# Patient Record
Sex: Female | Born: 1950 | Race: Black or African American | Hispanic: No | Marital: Married | Smoking: Former smoker
Health system: Southern US, Community
[De-identification: ages and names within clinical notes are randomized; demographics above are authoritative.]

## PROBLEM LIST (undated history)

## (undated) DIAGNOSIS — M1711 Unilateral primary osteoarthritis, right knee: Secondary | ICD-10-CM

## (undated) DIAGNOSIS — D708 Other neutropenia: Secondary | ICD-10-CM

## (undated) DIAGNOSIS — K219 Gastro-esophageal reflux disease without esophagitis: Secondary | ICD-10-CM

## (undated) DIAGNOSIS — H269 Unspecified cataract: Secondary | ICD-10-CM

## (undated) DIAGNOSIS — I776 Arteritis, unspecified: Secondary | ICD-10-CM

## (undated) DIAGNOSIS — D649 Anemia, unspecified: Secondary | ICD-10-CM

## (undated) DIAGNOSIS — Z9889 Other specified postprocedural states: Secondary | ICD-10-CM

## (undated) DIAGNOSIS — G971 Other reaction to spinal and lumbar puncture: Secondary | ICD-10-CM

## (undated) DIAGNOSIS — M069 Rheumatoid arthritis, unspecified: Secondary | ICD-10-CM

## (undated) DIAGNOSIS — R112 Nausea with vomiting, unspecified: Secondary | ICD-10-CM

## (undated) DIAGNOSIS — Z5189 Encounter for other specified aftercare: Secondary | ICD-10-CM

## (undated) DIAGNOSIS — I7300 Raynaud's syndrome without gangrene: Secondary | ICD-10-CM

## (undated) HISTORY — PX: OTHER SURGICAL HISTORY: SHX169

## (undated) HISTORY — PX: HIP ARTHROPLASTY: SHX981

## (undated) HISTORY — DX: Unspecified cataract: H26.9

## (undated) HISTORY — DX: Encounter for other specified aftercare: Z51.89

## (undated) HISTORY — PX: UPPER GI ENDOSCOPY: SHX6162

## (undated) HISTORY — PX: JOINT REPLACEMENT: SHX530

## (undated) HISTORY — DX: Gastro-esophageal reflux disease without esophagitis: K21.9

## (undated) HISTORY — PX: GASTRIC BYPASS: SHX52

---

## 1979-05-03 HISTORY — PX: TUBAL LIGATION: SHX77

## 2003-05-03 HISTORY — PX: JOINT REPLACEMENT: SHX530

## 2008-05-02 HISTORY — PX: HERNIA REPAIR: SHX51

## 2013-07-25 ENCOUNTER — Other Ambulatory Visit: Payer: Self-pay | Admitting: Obstetrics and Gynecology

## 2013-07-25 DIAGNOSIS — Z1231 Encounter for screening mammogram for malignant neoplasm of breast: Secondary | ICD-10-CM

## 2013-08-16 ENCOUNTER — Ambulatory Visit
Admission: RE | Admit: 2013-08-16 | Discharge: 2013-08-16 | Disposition: A | Discharge status: Home / Self Care | Payer: 59 | Source: Ambulatory Visit | Attending: Obstetrics and Gynecology | Admitting: Obstetrics and Gynecology

## 2013-08-16 DIAGNOSIS — Z1231 Encounter for screening mammogram for malignant neoplasm of breast: Secondary | ICD-10-CM

## 2013-08-23 ENCOUNTER — Other Ambulatory Visit (HOSPITAL_COMMUNITY): Payer: Self-pay | Admitting: Orthopaedic Surgery

## 2013-09-03 ENCOUNTER — Encounter (HOSPITAL_COMMUNITY): Payer: Self-pay | Admitting: Pharmacy Technician

## 2013-09-05 ENCOUNTER — Encounter (HOSPITAL_COMMUNITY)
Admission: RE | Admit: 2013-09-05 | Discharge: 2013-09-05 | Disposition: A | Discharge status: Home / Self Care | Payer: 59 | Source: Ambulatory Visit | Attending: Orthopaedic Surgery | Admitting: Orthopaedic Surgery

## 2013-09-05 ENCOUNTER — Encounter (HOSPITAL_COMMUNITY): Payer: Self-pay

## 2013-09-05 DIAGNOSIS — Z01812 Encounter for preprocedural laboratory examination: Secondary | ICD-10-CM | POA: Diagnosis present

## 2013-09-05 HISTORY — DX: Rheumatoid arthritis, unspecified: M06.9

## 2013-09-05 HISTORY — DX: Other reaction to spinal and lumbar puncture: G97.1

## 2013-09-05 HISTORY — DX: Anemia, unspecified: D64.9

## 2013-09-05 LAB — CBC
HCT: 39.2 % (ref 36.0–46.0)
HEMOGLOBIN: 12.5 g/dL (ref 12.0–15.0)
MCH: 28.2 pg (ref 26.0–34.0)
MCHC: 31.9 g/dL (ref 30.0–36.0)
MCV: 88.5 fL (ref 78.0–100.0)
PLATELETS: 308 10*3/uL (ref 150–400)
RBC: 4.43 MIL/uL (ref 3.87–5.11)
RDW: 14.4 % (ref 11.5–15.5)
WBC: 6.6 10*3/uL (ref 4.0–10.5)

## 2013-09-05 LAB — COMPREHENSIVE METABOLIC PANEL
ALT: 20 U/L (ref 0–35)
AST: 31 U/L (ref 0–37)
Albumin: 3.9 g/dL (ref 3.5–5.2)
Alkaline Phosphatase: 77 U/L (ref 39–117)
BUN: 18 mg/dL (ref 6–23)
CO2: 28 mEq/L (ref 19–32)
Calcium: 10.2 mg/dL (ref 8.4–10.5)
Chloride: 101 mEq/L (ref 96–112)
Creatinine, Ser: 0.83 mg/dL (ref 0.50–1.10)
GFR calc non Af Amer: 74 mL/min — ABNORMAL LOW (ref >90)
GFR, EST AFRICAN AMERICAN: 86 mL/min — AB (ref >90)
GLUCOSE: 85 mg/dL (ref 70–99)
Potassium: 4.5 mEq/L (ref 3.7–5.3)
SODIUM: 141 meq/L (ref 137–147)
Total Bilirubin: 0.4 mg/dL (ref 0.3–1.2)
Total Protein: 7 g/dL (ref 6.0–8.3)

## 2013-09-05 LAB — PROTIME-INR
INR: 0.96 (ref 0.00–1.49)
PROTHROMBIN TIME: 12.6 s (ref 11.6–15.2)

## 2013-09-05 LAB — ABO/RH: ABO/RH(D): A POS

## 2013-09-05 LAB — URINALYSIS, ROUTINE W REFLEX MICROSCOPIC
BILIRUBIN URINE: NEGATIVE
Glucose, UA: NEGATIVE mg/dL
Hgb urine dipstick: NEGATIVE
Ketones, ur: NEGATIVE mg/dL
LEUKOCYTES UA: NEGATIVE
Nitrite: NEGATIVE
Protein, ur: NEGATIVE mg/dL
SPECIFIC GRAVITY, URINE: 1.006 (ref 1.005–1.030)
Urobilinogen, UA: 0.2 mg/dL (ref 0.0–1.0)
pH: 7 (ref 5.0–8.0)

## 2013-09-05 LAB — TYPE AND SCREEN
ABO/RH(D): A POS
Antibody Screen: NEGATIVE

## 2013-09-05 LAB — SURGICAL PCR SCREEN
MRSA, PCR: NEGATIVE
Staphylococcus aureus: NEGATIVE

## 2013-09-05 LAB — APTT: aPTT: 25 seconds (ref 24–37)

## 2013-09-05 NOTE — Pre-Procedure Instructions (Signed)
Chelsea Callahan  09/05/2013   Your procedure is scheduled on: Friday, Sep 13, 2013 at 7:30 AM  Report to [facility] Short Stay Admitting Area  (use Main Entrance "A') at 5:30 AM.  Call this number if you have problems the morning of surgery: [phone]   Remember:   Do not eat food or drink liquids after midnight.   Take these medicines the morning of surgery with A SIP OF WATER: hydroxychloroquine (PLAQUENIL), predniSONE (DELTASONE)  Stop taking Aspirin, vitamins and herbal medications (TART CHERRY ADVANCED and Tarragon) Do not take any NSAIDs ie: Ibuprofen, Advil, Naproxen or any medication containing Aspirin. Stop 1 week prior to procedure.   Do not wear jewelry, make-up or nail polish.  Do not wear lotions, powders, or perfumes. You may wear deodorant.  Do not shave 48 hours prior to surgery.  Do not bring valuables to the hospital.  [facility] is not responsible for any belongings or valuables.               Contacts, dentures or bridgework may not be worn into surgery.  Leave suitcase in the car. After surgery it may be brought to your room.  For patients admitted to the hospital, discharge time is determined by your treatment team.               Patients discharged the day of surgery will not be allowed to drive home.  Name and phone number of your driver:  Special Instructions:  Special Instructions:Special Instructions: [facility] - Preparing for Surgery  Before surgery, you can play an important role.  Because skin is not sterile, your skin needs to be as free of germs as possible.  You can reduce the number of germs on you skin by washing with CHG (chlorahexidine gluconate) soap before surgery.  CHG is an antiseptic cleaner which kills germs and bonds with the skin to continue killing germs even after washing.  Please DO NOT use if you have an allergy to CHG or antibacterial soaps.  If your skin becomes reddened/irritated stop using the CHG and inform your nurse when you arrive  at Short Stay.  Do not shave (including legs and underarms) for at least 48 hours prior to the first CHG shower.  You may shave your face.  Please follow these instructions carefully:   1.  Shower with CHG Soap the night before surgery and the morning of Surgery.  2.  If you choose to wash your hair, wash your hair first as usual with your normal shampoo.  3.  After you shampoo, rinse your hair and body thoroughly to remove the Shampoo.  4.  Use CHG as you would any other liquid soap.  You can apply chg directly  to the skin and wash gently with scrungie or a clean washcloth.  5.  Apply the CHG Soap to your body ONLY FROM THE NECK DOWN.  Do not use on open wounds or open sores.  Avoid contact with your eyes, ears, mouth and genitals (private parts).  Wash genitals (private parts) with your normal soap.  6.  Wash thoroughly, paying special attention to the area where your surgery will be performed.  7.  Thoroughly rinse your body with warm water from the neck down.  8.  DO NOT shower/wash with your normal soap after using and rinsing off the CHG Soap.  9.  Pat yourself dry with a clean towel.            10.  Wear clean pajamas.            11.  Place clean sheets on your bed the night of your first shower and do not sleep with pets.  Day of Surgery  Do not apply any lotions the morning of surgery.  Please wear clean clothes to the hospital/surgery center.   Please read over the following fact sheets that you were given: Pain Booklet, Coughing and Deep Breathing, Blood Transfusion Information, Total Joint Packet, MRSA Information and Surgical Site Infection Prevention

## 2013-09-05 NOTE — Progress Notes (Signed)
Pt denies SOB, chest pain, and being under the care of a cardiologist. Pt denies having a cardiac cath. EKG, CXR, echo, stress test results were requested from [facility] in [place].

## 2013-09-10 NOTE — Progress Notes (Signed)
Records request sent on 09/05/13 to [facility] for cardiac records. Noted on chart that MD office stated that they did not have any records on patient. I contacted patient she stated that she was seen there about 1 year ago. I contacted medical records and they advised that they did not know cardiologist was that patient was referred to. Re contacted patient with information, she is going to recheck her records, call the office, and then call me back. I advised her that they had the release form and if they located the records they could go ahead and fax the information to [place].

## 2013-09-10 NOTE — Progress Notes (Addendum)
Patient called back and advised it was 2013 when she had the EKG will get DOS. Called and spoke to [employer] at [employer] they are sending additional records that are needed

## 2013-09-11 NOTE — H&P (Signed)
TOTAL KNEE ADMISSION H&P  Patient is being admitted for left total knee arthroplasty.  Subjective:  Chief Complaint:left knee pain.  HPI: Chelsea Callahan, 63 y.o. female, has a history of pain and functional disability in the left knee due to arthritis and has failed non-surgical conservative treatments for greater than 12 weeks to includeNSAID's and/or analgesics, corticosteriod injections and viscosupplementation injections.  Onset of symptoms was gradual, starting 3 years ago with gradually worsening course since that time. The patient noted no past surgery on the left knee(s).  Patient currently rates pain in the left knee(s) at 7 out of 10 with activity. Patient has worsening of pain with activity and weight bearing and pain that interferes with activities of daily living.  Patient has evidence of subchondral sclerosis, periarticular osteophytes and joint space narrowing by imaging studies. There is no active infection.  There are no active problems to display for this patient.  Past Medical History  Diagnosis Date  . Spinal headache   . RA (rheumatoid arthritis)   . Anemia     Past Surgical History  Procedure Laterality Date  . Cesarean section      x 2  . Hernia repair    . Gastric bypass    . Tubal ligation    . Abdominal clip      to stomach post gastric bypass    No prescriptions prior to admission   Allergies  Allergen Reactions  . Ginger     Stomach swells    History  Substance Use Topics  . Smoking status: Former Smoker    Types: Cigarettes  . Smokeless tobacco: Never Used     Comment: Quit smoking cigarettes in 1999  . Alcohol Use: Yes     Comment: "1 glass of wine 2-3 times a week."    Family History  Problem Relation Age of Onset  . Autoimmune disease Other   . Cancer - Other Other      Review of Systems  Constitutional: Negative.   HENT: Negative.   Eyes: Negative.   Respiratory: Negative.   Cardiovascular: Negative.   Genitourinary: Negative.    Musculoskeletal: Positive for joint pain and myalgias.  Skin: Negative.   Neurological: Negative.   Endo/Heme/Allergies: Negative.   Psychiatric/Behavioral: Negative.     Objective:  Physical Exam  Constitutional: She is oriented to person, place, and time. She appears well-developed and well-nourished.  HENT:  Head: Normocephalic and atraumatic.  Eyes: EOM are normal. Pupils are equal, round, and reactive to light.  Neck: Normal range of motion.  Cardiovascular: Normal rate, regular rhythm, normal heart sounds and intact distal pulses.   No murmur heard. Respiratory: Effort normal and breath sounds normal.  GI: Soft.  Musculoskeletal:  Bilateral varus deformity, left>right.  Medial joint line tenderness. Negative Lachman.  Mild laxity of MCL left. +crepitus.  ROM0-110  Neurological: She is alert and oriented to person, place, and time.  Skin: Skin is warm and dry.  Psychiatric: She has a normal mood and affect.    Vital signs in last 24 hours:    Labs:   There is no height or weight on file to calculate BMI.   Imaging Review Plain radiographs demonstrate severe degenerative joint disease of the left knee(s). The overall alignment issignificant varus. The bone quality appears to be adequate for age and reported activity level.  Assessment/Plan:  End stage arthritis, left knee   The patient history, physical examination, clinical judgment of the provider and imaging studies are consistent with  end stage degenerative joint disease of the left knee(s) and total knee arthroplasty is deemed medically necessary. The treatment options including medical management, injection therapy arthroscopy and arthroplasty were discussed at length. The risks and benefits of total knee arthroplasty were presented and reviewed. The risks due to aseptic loosening, infection, stiffness, patella tracking problems, thromboembolic complications and other imponderables were discussed. The patient  acknowledged the explanation, agreed to proceed with the plan and consent was signed. Patient is being admitted for inpatient treatment for surgery, pain control, PT, OT, prophylactic antibiotics, VTE prophylaxis, progressive ambulation and ADL's and discharge planning. The patient is planning to be discharged home with home health services

## 2013-09-12 MED ORDER — CHLORHEXIDINE GLUCONATE 4 % EX LIQD
60.0000 mL | Freq: Once | CUTANEOUS | Status: DC
  Filled 2013-09-12: qty 60

## 2013-09-12 MED ORDER — CEFAZOLIN SODIUM-DEXTROSE 2-3 GM-% IV SOLR
2.0000 g | INTRAVENOUS | Status: AC
  Administered 2013-09-13: 2 g via INTRAVENOUS
  Filled 2013-09-12: qty 50

## 2013-09-12 MED ORDER — HYDROCORTISONE NA SUCCINATE PF 100 MG IJ SOLR
100.0000 mg | INTRAMUSCULAR | Status: DC
Start: 2013-09-13 — End: 2013-09-13
  Filled 2013-09-12: qty 2

## 2013-09-13 ENCOUNTER — Inpatient Hospital Stay (HOSPITAL_COMMUNITY)
Admission: RE | Admit: 2013-09-13 | Discharge: 2013-09-16 | DRG: 470 | Disposition: A | Discharge status: Home / Self Care | Payer: BC Managed Care – PPO | Source: Ambulatory Visit | Attending: Orthopaedic Surgery | Admitting: Orthopaedic Surgery

## 2013-09-13 ENCOUNTER — Encounter (HOSPITAL_COMMUNITY)
Admission: RE | Disposition: A | Discharge status: Home / Self Care | Payer: Self-pay | Source: Ambulatory Visit | Attending: Orthopaedic Surgery

## 2013-09-13 ENCOUNTER — Encounter (HOSPITAL_COMMUNITY): Payer: Self-pay | Admitting: *Deleted

## 2013-09-13 ENCOUNTER — Inpatient Hospital Stay (HOSPITAL_COMMUNITY): Payer: BC Managed Care – PPO

## 2013-09-13 ENCOUNTER — Encounter (HOSPITAL_COMMUNITY): Payer: BC Managed Care – PPO | Admitting: Anesthesiology

## 2013-09-13 ENCOUNTER — Inpatient Hospital Stay (HOSPITAL_COMMUNITY): Payer: BC Managed Care – PPO | Admitting: Anesthesiology

## 2013-09-13 DIAGNOSIS — M069 Rheumatoid arthritis, unspecified: Secondary | ICD-10-CM | POA: Diagnosis present

## 2013-09-13 DIAGNOSIS — Z9884 Bariatric surgery status: Secondary | ICD-10-CM

## 2013-09-13 DIAGNOSIS — Z87891 Personal history of nicotine dependence: Secondary | ICD-10-CM

## 2013-09-13 DIAGNOSIS — M1712 Unilateral primary osteoarthritis, left knee: Secondary | ICD-10-CM | POA: Diagnosis present

## 2013-09-13 DIAGNOSIS — D62 Acute posthemorrhagic anemia: Secondary | ICD-10-CM | POA: Diagnosis not present

## 2013-09-13 DIAGNOSIS — M359 Systemic involvement of connective tissue, unspecified: Secondary | ICD-10-CM

## 2013-09-13 DIAGNOSIS — D638 Anemia in other chronic diseases classified elsewhere: Secondary | ICD-10-CM | POA: Diagnosis present

## 2013-09-13 DIAGNOSIS — M1710 Unilateral primary osteoarthritis, unspecified knee: Principal | ICD-10-CM | POA: Diagnosis present

## 2013-09-13 HISTORY — PX: KNEE ARTHROPLASTY: SHX992

## 2013-09-13 SURGERY — ARTHROPLASTY, KNEE, TOTAL, USING IMAGELESS COMPUTER-ASSISTED NAVIGATION
Anesthesia: General | Site: Knee | Laterality: Left

## 2013-09-13 MED ORDER — METOCLOPRAMIDE HCL 5 MG/ML IJ SOLN: 5.0000 mg | Freq: Three times a day (TID) | INTRAMUSCULAR | Status: DC | PRN

## 2013-09-13 MED ORDER — METOCLOPRAMIDE HCL 5 MG/ML IJ SOLN
INTRAMUSCULAR | Status: AC
  Filled 2013-09-13: qty 2

## 2013-09-13 MED ORDER — ONDANSETRON HCL 4 MG/2ML IJ SOLN: 4.0000 mg | Freq: Four times a day (QID) | INTRAMUSCULAR | Status: DC | PRN

## 2013-09-13 MED ORDER — KETOROLAC TROMETHAMINE 15 MG/ML IJ SOLN
15.0000 mg | Freq: Four times a day (QID) | INTRAMUSCULAR | Status: AC
  Administered 2013-09-13 – 2013-09-14 (×4): 15 mg via INTRAVENOUS
  Filled 2013-09-13 (×3): qty 1

## 2013-09-13 MED ORDER — OXYCODONE HCL 5 MG/5ML PO SOLN: 5.0000 mg | Freq: Once | ORAL | Status: DC | PRN

## 2013-09-13 MED ORDER — LIDOCAINE HCL (CARDIAC) 20 MG/ML IV SOLN
INTRAVENOUS | Status: AC
  Filled 2013-09-13: qty 5

## 2013-09-13 MED ORDER — ASPIRIN EC 325 MG PO TBEC: 325.0000 mg | DELAYED_RELEASE_TABLET | Freq: Every day | ORAL | Status: DC

## 2013-09-13 MED ORDER — GLYCOPYRROLATE 0.2 MG/ML IJ SOLN
INTRAMUSCULAR | Status: DC | PRN
  Administered 2013-09-13: 0.4 mg via INTRAVENOUS

## 2013-09-13 MED ORDER — PHENOL 1.4 % MT LIQD: 1.0000 | OROMUCOSAL | Status: DC | PRN

## 2013-09-13 MED ORDER — MIDAZOLAM HCL 2 MG/2ML IJ SOLN
INTRAMUSCULAR | Status: AC
  Filled 2013-09-13: qty 2

## 2013-09-13 MED ORDER — ACETAMINOPHEN 325 MG PO TABS
650.0000 mg | ORAL_TABLET | Freq: Four times a day (QID) | ORAL | Status: DC | PRN
  Administered 2013-09-13 – 2013-09-16 (×2): 650 mg via ORAL
  Filled 2013-09-13 (×2): qty 2

## 2013-09-13 MED ORDER — HYDROXYCHLOROQUINE SULFATE 200 MG PO TABS
200.0000 mg | ORAL_TABLET | Freq: Once | ORAL | Status: AC
  Administered 2013-09-13: 200 mg via ORAL
  Filled 2013-09-13: qty 1

## 2013-09-13 MED ORDER — OXYCODONE HCL 5 MG PO TABS: 5.0000 mg | ORAL_TABLET | Freq: Once | ORAL | Status: DC | PRN

## 2013-09-13 MED ORDER — HYDROXYCHLOROQUINE SULFATE 200 MG PO TABS
200.0000 mg | ORAL_TABLET | Freq: Two times a day (BID) | ORAL | Status: DC
  Administered 2013-09-14 – 2013-09-16 (×5): 200 mg via ORAL
  Filled 2013-09-13 (×6): qty 1

## 2013-09-13 MED ORDER — NEOSTIGMINE METHYLSULFATE 10 MG/10ML IV SOLN
INTRAVENOUS | Status: AC
  Filled 2013-09-13: qty 1

## 2013-09-13 MED ORDER — PREDNISONE 5 MG PO TABS
5.0000 mg | ORAL_TABLET | Freq: Every day | ORAL | Status: DC
  Administered 2013-09-14 – 2013-09-16 (×3): 5 mg via ORAL
  Filled 2013-09-13 (×5): qty 1

## 2013-09-13 MED ORDER — FENTANYL CITRATE 0.05 MG/ML IJ SOLN
INTRAMUSCULAR | Status: AC
  Filled 2013-09-13: qty 5

## 2013-09-13 MED ORDER — BUPIVACAINE HCL (PF) 0.25 % IJ SOLN
INTRAMUSCULAR | Status: AC
  Filled 2013-09-13: qty 30

## 2013-09-13 MED ORDER — ONDANSETRON HCL 4 MG/2ML IJ SOLN
INTRAMUSCULAR | Status: AC
  Filled 2013-09-13: qty 2

## 2013-09-13 MED ORDER — SODIUM CHLORIDE 0.9 % IR SOLN
Status: DC | PRN
  Administered 2013-09-13: 3000 mL

## 2013-09-13 MED ORDER — 0.9 % SODIUM CHLORIDE (POUR BTL) OPTIME
TOPICAL | Status: DC | PRN
  Administered 2013-09-13: 1000 mL

## 2013-09-13 MED ORDER — HYDROMORPHONE HCL PF 1 MG/ML IJ SOLN
INTRAMUSCULAR | Status: AC
  Administered 2013-09-13: 0.5 mg via INTRAVENOUS
  Filled 2013-09-13: qty 1

## 2013-09-13 MED ORDER — ASPIRIN EC 325 MG PO TBEC
325.0000 mg | DELAYED_RELEASE_TABLET | Freq: Every day | ORAL | Status: DC
  Administered 2013-09-14 – 2013-09-16 (×3): 325 mg via ORAL
  Filled 2013-09-13 (×5): qty 1

## 2013-09-13 MED ORDER — DIPHENHYDRAMINE HCL 12.5 MG/5ML PO ELIX: 12.5000 mg | ORAL_SOLUTION | ORAL | Status: DC | PRN

## 2013-09-13 MED ORDER — ROCURONIUM BROMIDE 100 MG/10ML IV SOLN
INTRAVENOUS | Status: DC | PRN
  Administered 2013-09-13: 40 mg via INTRAVENOUS

## 2013-09-13 MED ORDER — HYDROCORTISONE NA SUCCINATE PF 100 MG IJ SOLR
100.0000 mg | Freq: Two times a day (BID) | INTRAMUSCULAR | Status: AC
  Administered 2013-09-13 (×2): 100 mg via INTRAVENOUS
  Filled 2013-09-13 (×3): qty 2

## 2013-09-13 MED ORDER — DEXAMETHASONE SODIUM PHOSPHATE 4 MG/ML IJ SOLN
INTRAMUSCULAR | Status: AC
  Filled 2013-09-13: qty 1

## 2013-09-13 MED ORDER — ACETAMINOPHEN 650 MG RE SUPP: 650.0000 mg | Freq: Four times a day (QID) | RECTAL | Status: DC | PRN

## 2013-09-13 MED ORDER — ONDANSETRON HCL 4 MG PO TABS
4.0000 mg | ORAL_TABLET | Freq: Four times a day (QID) | ORAL | Status: DC | PRN
  Administered 2013-09-15 (×2): 4 mg via ORAL
  Filled 2013-09-13 (×2): qty 1

## 2013-09-13 MED ORDER — KCL IN DEXTROSE-NACL 20-5-0.45 MEQ/L-%-% IV SOLN
INTRAVENOUS | Status: DC
  Administered 2013-09-13 – 2013-09-14 (×2): via INTRAVENOUS
  Filled 2013-09-13 (×7): qty 1000

## 2013-09-13 MED ORDER — MORPHINE SULFATE 2 MG/ML IJ SOLN
2.0000 mg | INTRAMUSCULAR | Status: DC | PRN
Start: 2013-09-13 — End: 2013-09-15
  Administered 2013-09-15 (×3): 2 mg via INTRAVENOUS
  Filled 2013-09-13 (×3): qty 1

## 2013-09-13 MED ORDER — NEOSTIGMINE METHYLSULFATE 10 MG/10ML IV SOLN
INTRAVENOUS | Status: DC | PRN
  Administered 2013-09-13: 3 mg via INTRAVENOUS

## 2013-09-13 MED ORDER — MIDAZOLAM HCL 5 MG/5ML IJ SOLN
INTRAMUSCULAR | Status: DC | PRN
  Administered 2013-09-13: 2 mg via INTRAVENOUS

## 2013-09-13 MED ORDER — PROPOFOL 10 MG/ML IV BOLUS
INTRAVENOUS | Status: AC
  Filled 2013-09-13: qty 20

## 2013-09-13 MED ORDER — SENNOSIDES-DOCUSATE SODIUM 8.6-50 MG PO TABS: 1.0000 | ORAL_TABLET | Freq: Every evening | ORAL | Status: DC | PRN

## 2013-09-13 MED ORDER — FENTANYL CITRATE 0.05 MG/ML IJ SOLN
INTRAMUSCULAR | Status: DC | PRN
  Administered 2013-09-13 (×3): 50 ug via INTRAVENOUS
  Administered 2013-09-13: 100 ug via INTRAVENOUS
  Administered 2013-09-13: 50 ug via INTRAVENOUS
  Administered 2013-09-13 (×2): 100 ug via INTRAVENOUS
  Administered 2013-09-13: 50 ug via INTRAVENOUS
  Administered 2013-09-13: 100 ug via INTRAVENOUS

## 2013-09-13 MED ORDER — MENTHOL 3 MG MT LOZG: 1.0000 | LOZENGE | OROMUCOSAL | Status: DC | PRN

## 2013-09-13 MED ORDER — METHOCARBAMOL 500 MG PO TABS
500.0000 mg | ORAL_TABLET | Freq: Four times a day (QID) | ORAL | Status: DC | PRN
Start: 2013-09-13 — End: 2013-09-16
  Administered 2013-09-14 – 2013-09-16 (×8): 500 mg via ORAL
  Filled 2013-09-13 (×9): qty 1

## 2013-09-13 MED ORDER — BUPIVACAINE HCL (PF) 0.5 % IJ SOLN
INTRAMUSCULAR | Status: DC | PRN
  Administered 2013-09-13: 25 mL via PERINEURAL

## 2013-09-13 MED ORDER — KETOROLAC TROMETHAMINE 30 MG/ML IJ SOLN
INTRAMUSCULAR | Status: AC
  Filled 2013-09-13: qty 1

## 2013-09-13 MED ORDER — METHOCARBAMOL 500 MG PO TABS
500.0000 mg | ORAL_TABLET | Freq: Four times a day (QID) | ORAL | Status: DC | PRN
Start: 2013-09-13 — End: 2014-05-27

## 2013-09-13 MED ORDER — ONDANSETRON HCL 4 MG/2ML IJ SOLN
INTRAMUSCULAR | Status: DC | PRN
  Administered 2013-09-13: 4 mg via INTRAVENOUS

## 2013-09-13 MED ORDER — METOCLOPRAMIDE HCL 5 MG/ML IJ SOLN: 10.0000 mg | Freq: Once | INTRAMUSCULAR | Status: DC | PRN

## 2013-09-13 MED ORDER — METOCLOPRAMIDE HCL 5 MG/ML IJ SOLN
INTRAMUSCULAR | Status: DC | PRN
  Administered 2013-09-13: 10 mg via INTRAVENOUS

## 2013-09-13 MED ORDER — GLYCOPYRROLATE 0.2 MG/ML IJ SOLN
INTRAMUSCULAR | Status: AC
Start: 2013-09-13 — End: 2013-09-13
  Filled 2013-09-13: qty 2

## 2013-09-13 MED ORDER — DOCUSATE SODIUM 100 MG PO CAPS
100.0000 mg | ORAL_CAPSULE | Freq: Two times a day (BID) | ORAL | Status: DC
  Administered 2013-09-14 – 2013-09-15 (×2): 100 mg via ORAL
  Filled 2013-09-13 (×8): qty 1

## 2013-09-13 MED ORDER — BISACODYL 10 MG RE SUPP: 10.0000 mg | Freq: Every day | RECTAL | Status: DC | PRN

## 2013-09-13 MED ORDER — HYDROMORPHONE HCL PF 1 MG/ML IJ SOLN
0.2500 mg | INTRAMUSCULAR | Status: DC | PRN
  Administered 2013-09-13 (×4): 0.5 mg via INTRAVENOUS

## 2013-09-13 MED ORDER — ROCURONIUM BROMIDE 50 MG/5ML IV SOLN
INTRAVENOUS | Status: AC
  Filled 2013-09-13: qty 1

## 2013-09-13 MED ORDER — DEXAMETHASONE SODIUM PHOSPHATE 10 MG/ML IJ SOLN
INTRAMUSCULAR | Status: DC | PRN
  Administered 2013-09-13: 4 mg via INTRAVENOUS

## 2013-09-13 MED ORDER — OXYCODONE-ACETAMINOPHEN 5-325 MG PO TABS: 1.0000 | ORAL_TABLET | ORAL | Status: DC | PRN

## 2013-09-13 MED ORDER — METHOCARBAMOL 1000 MG/10ML IJ SOLN
500.0000 mg | Freq: Four times a day (QID) | INTRAVENOUS | Status: DC | PRN
  Filled 2013-09-13: qty 5

## 2013-09-13 MED ORDER — PROPOFOL 10 MG/ML IV BOLUS
INTRAVENOUS | Status: DC | PRN
  Administered 2013-09-13: 150 mg via INTRAVENOUS
  Administered 2013-09-13: 50 mg via INTRAVENOUS

## 2013-09-13 MED ORDER — OXYCODONE HCL 5 MG PO TABS
5.0000 mg | ORAL_TABLET | ORAL | Status: DC | PRN
  Administered 2013-09-13 (×3): 5 mg via ORAL
  Administered 2013-09-14: 10 mg via ORAL
  Administered 2013-09-14: 5 mg via ORAL
  Administered 2013-09-14: 10 mg via ORAL
  Administered 2013-09-14: 5 mg via ORAL
  Administered 2013-09-14 – 2013-09-16 (×11): 10 mg via ORAL
  Filled 2013-09-13: qty 2
  Filled 2013-09-13: qty 1
  Filled 2013-09-13 (×4): qty 2
  Filled 2013-09-13: qty 1
  Filled 2013-09-13 (×3): qty 2
  Filled 2013-09-13: qty 1
  Filled 2013-09-13 (×2): qty 2
  Filled 2013-09-13: qty 1
  Filled 2013-09-13 (×4): qty 2

## 2013-09-13 MED ORDER — BUPIVACAINE HCL (PF) 0.25 % IJ SOLN: INTRAMUSCULAR | Status: DC | PRN

## 2013-09-13 MED ORDER — FLEET ENEMA 7-19 GM/118ML RE ENEM: 1.0000 | ENEMA | Freq: Once | RECTAL | Status: AC | PRN

## 2013-09-13 MED ORDER — METOCLOPRAMIDE HCL 10 MG PO TABS: 5.0000 mg | ORAL_TABLET | Freq: Three times a day (TID) | ORAL | Status: DC | PRN

## 2013-09-13 MED ORDER — LACTATED RINGERS IV SOLN
INTRAVENOUS | Status: DC | PRN
Start: 2013-09-13 — End: 2013-09-13
  Administered 2013-09-13 (×2): via INTRAVENOUS

## 2013-09-13 SURGICAL SUPPLY — 60 items
BANDAGE ELASTIC 4 VELCRO ST LF (GAUZE/BANDAGES/DRESSINGS) ×2 IMPLANT
BANDAGE ELASTIC 6 VELCRO ST LF (GAUZE/BANDAGES/DRESSINGS) ×2 IMPLANT
BANDAGE ESMARK 6X9 LF (GAUZE/BANDAGES/DRESSINGS) ×1 IMPLANT
BENZOIN TINCTURE PRP APPL 2/3 (GAUZE/BANDAGES/DRESSINGS) ×2 IMPLANT
BLADE SAGITTAL 25.0X1.19X90 (BLADE) ×2 IMPLANT
BLADE SAW SGTL 13X75X1.27 (BLADE) ×2 IMPLANT
BNDG ESMARK 6X9 LF (GAUZE/BANDAGES/DRESSINGS) ×2
BOWL SMART MIX CTS (DISPOSABLE) ×2 IMPLANT
CAPT RP KNEE ×2 IMPLANT
CEMENT HV SMART SET (Cement) ×4 IMPLANT
COVER SURGICAL LIGHT HANDLE (MISCELLANEOUS) ×2 IMPLANT
CUFF TOURNIQUET SINGLE 34IN LL (TOURNIQUET CUFF) ×2 IMPLANT
DERMABOND ADVANCED (GAUZE/BANDAGES/DRESSINGS) ×1
DERMABOND ADVANCED .7 DNX12 (GAUZE/BANDAGES/DRESSINGS) ×1 IMPLANT
DRAPE ORTHO SPLIT 77X108 STRL (DRAPES) ×2
DRAPE SURG ORHT 6 SPLT 77X108 (DRAPES) ×2 IMPLANT
DRAPE U-SHAPE 47X51 STRL (DRAPES) ×2 IMPLANT
DURAPREP 26ML APPLICATOR (WOUND CARE) ×2 IMPLANT
ELECT REM PT RETURN 9FT ADLT (ELECTROSURGICAL) ×2
ELECTRODE REM PT RTRN 9FT ADLT (ELECTROSURGICAL) ×1 IMPLANT
FACESHIELD WRAPAROUND (MASK) ×4 IMPLANT
GLOVE BIOGEL PI IND STRL 7.5 (GLOVE) ×1 IMPLANT
GLOVE BIOGEL PI IND STRL 8 (GLOVE) ×1 IMPLANT
GLOVE BIOGEL PI INDICATOR 7.5 (GLOVE) ×1
GLOVE BIOGEL PI INDICATOR 8 (GLOVE) ×1
GLOVE ECLIPSE 7.0 STRL STRAW (GLOVE) ×2 IMPLANT
GLOVE ORTHO TXT STRL SZ7.5 (GLOVE) ×2 IMPLANT
GOWN STRL REUS W/ TWL LRG LVL3 (GOWN DISPOSABLE) ×2 IMPLANT
GOWN STRL REUS W/ TWL XL LVL3 (GOWN DISPOSABLE) ×1 IMPLANT
GOWN STRL REUS W/TWL LRG LVL3 (GOWN DISPOSABLE) ×2
GOWN STRL REUS W/TWL XL LVL3 (GOWN DISPOSABLE) ×1
HANDPIECE INTERPULSE COAX TIP (DISPOSABLE) ×1
IMMOBILIZER KNEE 22 UNIV (SOFTGOODS) ×2 IMPLANT
KIT BASIN OR (CUSTOM PROCEDURE TRAY) ×2 IMPLANT
KIT ROOM TURNOVER OR (KITS) ×2 IMPLANT
MANIFOLD NEPTUNE II (INSTRUMENTS) ×2 IMPLANT
MARKER SPHERE PSV REFLC THRD 5 (MARKER) ×6 IMPLANT
NEEDLE HYPO 25GX1X1/2 BEV (NEEDLE) ×2 IMPLANT
NS IRRIG 1000ML POUR BTL (IV SOLUTION) ×2 IMPLANT
PACK TOTAL JOINT (CUSTOM PROCEDURE TRAY) ×2 IMPLANT
PAD ABD 8X10 STRL (GAUZE/BANDAGES/DRESSINGS) ×2 IMPLANT
PAD ARMBOARD 7.5X6 YLW CONV (MISCELLANEOUS) ×4 IMPLANT
PAD CAST 4YDX4 CTTN HI CHSV (CAST SUPPLIES) ×1 IMPLANT
PADDING CAST COTTON 4X4 STRL (CAST SUPPLIES) ×1
PADDING CAST COTTON 6X4 STRL (CAST SUPPLIES) ×2 IMPLANT
PIN SCHANZ 4MM 130MM (PIN) ×8 IMPLANT
SET HNDPC FAN SPRY TIP SCT (DISPOSABLE) ×1 IMPLANT
SPONGE GAUZE 4X4 12PLY (GAUZE/BANDAGES/DRESSINGS) ×2 IMPLANT
STAPLER VISISTAT 35W (STAPLE) ×2 IMPLANT
STRIP CLOSURE SKIN 1/2X4 (GAUZE/BANDAGES/DRESSINGS) ×2 IMPLANT
SUCTION FRAZIER TIP 10 FR DISP (SUCTIONS) ×2 IMPLANT
SUT ETHIBOND NAB CT1 #1 30IN (SUTURE) ×2 IMPLANT
SUT VIC AB 2-0 CT1 27 (SUTURE) ×1
SUT VIC AB 2-0 CT1 TAPERPNT 27 (SUTURE) ×1 IMPLANT
SUT VICRYL 4-0 PS2 18IN ABS (SUTURE) ×2 IMPLANT
SYR CONTROL 10ML LL (SYRINGE) ×2 IMPLANT
TOWEL OR 17X24 6PK STRL BLUE (TOWEL DISPOSABLE) ×2 IMPLANT
TOWEL OR 17X26 10 PK STRL BLUE (TOWEL DISPOSABLE) ×2 IMPLANT
TRAY FOLEY CATH 16FRSI W/METER (SET/KITS/TRAYS/PACK) ×2 IMPLANT
WATER STERILE IRR 1000ML POUR (IV SOLUTION) ×6 IMPLANT

## 2013-09-13 NOTE — Discharge Instructions (Signed)
Keep knee incision dry for 5 days post op then may wet while bathing. Therapy daily and  goal full extension and greater than 90 degrees flexion. Call if fever or chills or increased drainage. Go to ER if acutely short of breath or call for ambulance. Return for follow up in 2 weeks. May full weight bear on the surgical leg unless told otherwise. Use knee immobilizer until able to straight leg raise off bed with knee stable. In house walking for first 2 weeks. Ice packs to knee daily or as needed for pain and swelling

## 2013-09-13 NOTE — Evaluation (Signed)
Physical Therapy Evaluation Patient Details Name: Chelsea Callahan MRN: [id number] DOB: 15-Jul-1950 Today's Date: 09/13/2013   History of Present Illness  pt presents with L TKA and indicates plan for R TKA in future.    Clinical Impression  Pt needs max encouragement to attempt mobility, but moves very well with bed mobility once agreeable.  Pt has all needed DME.  Pt left in footsie roll and will continue to follow.      Follow Up Recommendations Home health PT;Supervision/Assistance - 24 hour    Equipment Recommendations  None recommended by PT    Recommendations for Other Services       Precautions / Restrictions Precautions Precautions: Knee;Fall Precaution Booklet Issued: Yes (comment) Required Braces or Orthoses: Knee Immobilizer - Left Knee Immobilizer - Left:  (With walking only.  ) Restrictions Weight Bearing Restrictions: Yes LLE Weight Bearing: Weight bearing as tolerated      Mobility  Bed Mobility Overal bed mobility: Needs Assistance Bed Mobility: Supine to Sit;Sit to Supine     Supine to sit: Min assist;HOB elevated Sit to supine: Min assist;HOB elevated   General bed mobility comments: cues for sequencing and use of bed rail.  pt needed encouragement.    Transfers                    Ambulation/Gait                Stairs            Wheelchair Mobility    Modified Rankin (Stroke Patients Only)       Balance                                             Pertinent Vitals/Pain L knee 5/10.  Premedicated.      Home Living Family/patient expects to be discharged to:: Private residence Living Arrangements: Spouse/significant other Available Help at Discharge: Family;Available 24 hours/day Type of Home: House Home Access: Stairs to enter Entrance Stairs-Rails: Right (Not a steady on per husband) Secretary/administrator of Steps: 5 Home Layout: Two level;1/2 bath on main level;Bed/bath upstairs Home Equipment:  Walker - 2 wheels;Bedside commode;Shower seat      Prior Function Level of Independence: Independent               Hand Dominance        Extremity/Trunk Assessment   Upper Extremity Assessment: Defer to OT evaluation           Lower Extremity Assessment: Generalized weakness;LLE deficits/detail   LLE Deficits / Details: AROM ~ 10 - 65.    Cervical / Trunk Assessment: Normal  Communication   Communication: No difficulties  Cognition Arousal/Alertness: Awake/alert Behavior During Therapy: WFL for tasks assessed/performed Overall Cognitive Status: Within Functional Limits for tasks assessed                      General Comments      Exercises Total Joint Exercises Ankle Circles/Pumps: AROM;Left;10 reps Long Arc Quad: AROM;Left;10 reps Knee Flexion: AROM;Left;10 reps      Assessment/Plan    PT Assessment Patient needs continued PT services  PT Diagnosis Abnormality of gait;Generalized weakness   PT Problem List Decreased strength;Decreased range of motion;Decreased activity tolerance;Decreased balance;Decreased mobility;Decreased knowledge of use of DME;Pain  PT Treatment Interventions DME instruction;Gait training;Stair training;Functional mobility training;Therapeutic exercise;Therapeutic activities;Balance training;Patient/family  education   PT Goals (Current goals can be found in the Care Plan section) Acute Rehab PT Goals Patient Stated Goal: Get this on better because the R knee needs replacing.   PT Goal Formulation: With patient Time For Goal Achievement: 09/20/13 Potential to Achieve Goals: Good    Frequency 7X/week   Barriers to discharge        Co-evaluation               End of Session   Activity Tolerance: Patient limited by fatigue Patient left: in bed;with call bell/phone within reach;with family/visitor present Nurse Communication: Mobility status         Time: 7494-4967 PT Time Calculation (min): 25  min   Charges:   PT Evaluation $Initial PT Evaluation Tier I: 1 Procedure PT Treatments $Therapeutic Activity: 8-22 mins   PT G CodesSunny Schlein, [place] [phone] 09/13/2013, 2:20 PM

## 2013-09-13 NOTE — Op Note (Signed)
Chelsea Callahan, Chelsea Callahan                 ACCOUNT NO.:  [id number]  MEDICAL RECORD NO.:  [id number]  LOCATION:  [facility]                         FACILITY:  [facility]  PHYSICIAN:  Rivers Gassmann C. [name], M.D.    DATE OF BIRTH:  08-Sep-1950  DATE OF PROCEDURE:  09/13/2013 DATE OF DISCHARGE:                              OPERATIVE REPORT   PREOPERATIVE DIAGNOSIS:  Left knee osteoarthritis.  POSTOPERATIVE DIAGNOSIS:  Left knee osteoarthritis.  PROCEDURE:  Left total knee arthroplasty, cemented, computer assist.  SURGEON:  Farah Benish C. [name], M.D.  ASSISTANT:  Maud Deed, PA-C, medically necessary and present for the entire procedure.  ANESTHESIA:  General plus preoperative adductor block.  DRAINS:  None.  COMPONENTS:  DePuy, [employer] rotating platform 2.5 femur, 2.5 tibia, 10 mm bearing, and 3-pegged patella.  DESCRIPTION OF PROCEDURE:  After induction of general anesthesia, Ancef prophylaxis, proximal thigh, tourniquet application, DuraPrep, usual split sheets, drapes, sterile skin marker, impervious stockinette, Coban, Betadine, Steri-Drape, sealing the skin.  Time-out procedure was completed.  Leg was wrapped in Esmarch, tourniquet was inflated. Midline incision was made.  There was a large globule thick adipose tissue sitting over the distal quad tendon, about the size of a softball, which was divided slightly, thinned to expose the quad tendon. Quad tendon was split between the medial one-third and lateral two- thirds.  Medial parapatellar incision was made.  Patella was everted, 10 mm were resected.  There was bone-on-bone grade 4 changes, worn groove was in the medial femoral condyle, tricompartmental degenerative arthritis, severe.  Meniscal remnants were resected.  Minimal amount of the medial meniscus was still present.  Lateral spurs were resected using rongeur.  Stab incision was made.  Pins were placed in the tibia.  Pins in the femur inside the skin incision.  Computer modules  were generated, tibia and femur, 2.5 size was selected.  Epicondylar axis was used and 9 mm were removed off the femur and 8 off the tibia.  There was some medial scalloping on the medial tibial plateau, but resected 2 mm distal to that area.  Keel preparation, box cut, trial insertions. Flexion/extension balance was good.  There was full extension, less than 0.5 mm difference between the medial and lateral gaps.  Collaterals were balanced on flexion and extension.  Pulse lavage, drilling of the keel holes for the femur, preparation of the patella sizing.  Drilling for the pegs and 3/4 curved osteotome used to resect some spurs posteriorly off the femur.  PCL was completely resected, pulsatile lavage, vacuum mixing of the cement, tibia was cemented followed by femur, insertion of the 10 mm spacer ,then the 3-pegged poly held with the patellar clamp. Cement was hardened at 15 minutes.  Tourniquet was deflated.  All excessive cement had been removed.  Hemostasis was obtained and then standard layered closure.  Stab incision made.  Tibia closed with 4-0 subcuticular Vicryl.  #1 in the deep retinaculum, 2 on the subcutaneous tissue, subcuticular skin closure.  Tincture of benzoin, Steri-Strips, postop dressing, and knee immobilizer.  Instrument count and needle count was correct.     Chelsea Callahan, M.D.     MCY/MEDQ  D:  09/13/2013  T:  09/13/2013  Job:  [id number]

## 2013-09-13 NOTE — Progress Notes (Signed)
Orthopedic Tech Progress Note Patient Details:  Chelsea Callahan 1950/11/27 [id number] CPM applied to LLE with appropriate settings. OHF applied to bed. Footsie roll provided. CPM Left Knee CPM Left Knee: On Left Knee Flexion (Degrees): 60 Left Knee Extension (Degrees): 0   Asia R Thompson 09/13/2013, 11:15 AM

## 2013-09-13 NOTE — Anesthesia Postprocedure Evaluation (Signed)
Anesthesia Post Note  Patient: Chelsea Callahan  Procedure(s) Performed: Procedure(s) (LRB): LEFT COMPUTER ASSISTED TOTAL KNEE ARTHROPLASTY (Left)  Anesthesia type: General  Patient location: PACU  Post pain: Pain level controlled  Post assessment: Patient's Cardiovascular Status Stable  Last Vitals:  Filed Vitals:   09/13/13 1032  BP: 167/83  Pulse: 69  Temp:   Resp: 11    Post vital signs: Reviewed and stable  Level of consciousness: alert  Complications: No apparent anesthesia complications

## 2013-09-13 NOTE — Plan of Care (Signed)
Problem: Consults Goal: Diagnosis- Total Joint Replacement Primary Total Knee Left     

## 2013-09-13 NOTE — Progress Notes (Signed)
NOTIFIED DR. FREDRICK OF PATIENT STATING SHE HAS 2 EARRINGS THAT DON'T COME OUT. BANDAID PLACED.

## 2013-09-13 NOTE — Brief Op Note (Cosign Needed)
09/13/2013  9:51 AM  PATIENT:  Chelsea Callahan  63 y.o. female  PRE-OPERATIVE DIAGNOSIS:  Left Knee Rheumatoid Arthritis  POST-OPERATIVE DIAGNOSIS:  Left Knee Rheumatoid Arthritis  PROCEDURE:  Procedure(s) with comments: LEFT COMPUTER ASSISTED TOTAL KNEE ARTHROPLASTY (Left) - Left Total Knee Arthroplasty, Computer Assist  SURGEON:  Surgeon(s) and Role:    * Eldred Manges, MD - Primary  PHYSICIAN ASSISTANT: Maud Deed PAC  ASSISTANTS: none   ANESTHESIA:   general  EBL:  Total I/O In: -  Out: 200 [Urine:200]  BLOOD ADMINISTERED:none  DRAINS: none   LOCAL MEDICATIONS USED:  NONE  SPECIMEN:  No Specimen  DISPOSITION OF SPECIMEN:  N/A  COUNTS:  YES  TOURNIQUET:   Total Tourniquet Time Documented: Thigh (Left) - 54 minutes Total: Thigh (Left) - 54 minutes   DICTATION: .Note written in EPIC  PLAN OF CARE: Admit to inpatient   PATIENT DISPOSITION:  PACU - hemodynamically stable.   Delay start of Pharmacological VTE agent (>24hrs) due to surgical blood loss or risk of bleeding: no

## 2013-09-13 NOTE — Anesthesia Procedure Notes (Signed)
Anesthesia Regional Block:  Femoral nerve block  Pre-Anesthetic Checklist: ,, timeout performed, Correct Patient, Correct Site, Correct Laterality, Correct Procedure, Correct Position, site marked, Risks and benefits discussed,  Surgical consent,  Pre-op evaluation,  At surgeon's request and post-op pain management  Laterality: Left  Prep: chloraprep       Needles:   Needle Type: Other     Needle Length: 9cm 9 cm Needle Gauge: 21 and 21 G    Additional Needles:  Procedures: ultrasound guided (picture in chart) Femoral nerve block Narrative:  Start time: 09/13/2013 7:14 AM End time: 09/13/2013 7:21 AM Injection made incrementally with aspirations every 5 mL.  Performed by: Personally  Anesthesiologist: C. Jerri Glauser MD  Additional Notes: Ultrasound guidance used to: id relevant anatomy, confirm needle position, local anesthetic spread, avoidance of vascular puncture. Picture saved. No complications. Block performed personally by Janetta Hora. Gelene Mink, MD

## 2013-09-13 NOTE — Transfer of Care (Signed)
Immediate Anesthesia Transfer of Care Note  Patient: Chelsea Callahan  Procedure(s) Performed: Procedure(s) with comments: LEFT COMPUTER ASSISTED TOTAL KNEE ARTHROPLASTY (Left) - Left Total Knee Arthroplasty, Computer Assist  Patient Location: PACU  Anesthesia Type:General  Level of Consciousness: awake and alert   Airway & Oxygen Therapy: Patient Spontanous Breathing and Patient connected to nasal cannula oxygen  Post-op Assessment: Report given to PACU RN and Post -op Vital signs reviewed and stable  Post vital signs: Reviewed and stable  Complications: No apparent anesthesia complications

## 2013-09-13 NOTE — Progress Notes (Signed)
Utilization review completed.  

## 2013-09-13 NOTE — Interval H&P Note (Signed)
History and Physical Interval Note:  09/13/2013 7:22 AM  Chelsea Callahan  has presented today for surgery, with the diagnosis of Left Knee Rheumatoid Arthritis  The various methods of treatment have been discussed with the patient and family. After consideration of risks, benefits and other options for treatment, the patient has consented to  Procedure(s) with comments: LEFT COMPUTER ASSISTED TOTAL KNEE ARTHROPLASTY (Left) - Left Total Knee Arthroplasty, Computer Assist as a surgical intervention .  The patient's history has been reviewed, patient examined, no change in status, stable for surgery.  I have reviewed the patient's chart and labs.  Questions were answered to the patient's satisfaction.     Eldred Manges

## 2013-09-13 NOTE — Anesthesia Preprocedure Evaluation (Signed)
Anesthesia Evaluation  Patient identified by MRN, date of birth, ID band Patient awake    Reviewed: Allergy & Precautions, H&P , NPO status , Patient's Chart, lab work & pertinent test results, reviewed documented beta blocker date and time   History of Anesthesia Complications (+) POST - OP SPINAL HEADACHE and history of anesthetic complications  Airway Mallampati: II TM Distance: >3 FB Neck ROM: full    Dental   Pulmonary neg pulmonary ROS, former smoker,  breath sounds clear to auscultation        Cardiovascular negative cardio ROS  Rhythm:regular     Neuro/Psych  Headaches, negative psych ROS   GI/Hepatic negative GI ROS, Neg liver ROS,   Endo/Other  negative endocrine ROS  Renal/GU negative Renal ROS  negative genitourinary   Musculoskeletal   Abdominal   Peds  Hematology negative hematology ROS (+) anemia ,   Anesthesia Other Findings See surgeon's H&P   Reproductive/Obstetrics negative OB ROS                           Anesthesia Physical Anesthesia Plan  ASA: II  Anesthesia Plan: General   Post-op Pain Management:    Induction: Intravenous  Airway Management Planned: Oral ETT  Additional Equipment:   Intra-op Plan:   Post-operative Plan: Extubation in OR  Informed Consent: I have reviewed the patients History and Physical, chart, labs and discussed the procedure including the risks, benefits and alternatives for the proposed anesthesia with the patient or authorized representative who has indicated his/her understanding and acceptance.   Dental Advisory Given  Plan Discussed with: CRNA and Surgeon  Anesthesia Plan Comments: (Patient advised to remove pierced jewelry but has refused. She accepts risk of maintaining these items. CF)        Anesthesia Quick Evaluation

## 2013-09-14 LAB — BASIC METABOLIC PANEL
BUN: 9 mg/dL (ref 6–23)
CALCIUM: 9.5 mg/dL (ref 8.4–10.5)
CO2: 28 mEq/L (ref 19–32)
Chloride: 103 mEq/L (ref 96–112)
Creatinine, Ser: 0.76 mg/dL (ref 0.50–1.10)
GFR, EST NON AFRICAN AMERICAN: 89 mL/min — AB (ref >90)
Glucose, Bld: 114 mg/dL — ABNORMAL HIGH (ref 70–99)
POTASSIUM: 4.9 meq/L (ref 3.7–5.3)
SODIUM: 139 meq/L (ref 137–147)

## 2013-09-14 LAB — CBC
HEMATOCRIT: 30.9 % — AB (ref 36.0–46.0)
HEMOGLOBIN: 9.8 g/dL — AB (ref 12.0–15.0)
MCH: 27.9 pg (ref 26.0–34.0)
MCHC: 31.7 g/dL (ref 30.0–36.0)
MCV: 88 fL (ref 78.0–100.0)
Platelets: 246 10*3/uL (ref 150–400)
RBC: 3.51 MIL/uL — ABNORMAL LOW (ref 3.87–5.11)
RDW: 14.3 % (ref 11.5–15.5)
WBC: 6.7 10*3/uL (ref 4.0–10.5)

## 2013-09-14 NOTE — Progress Notes (Signed)
Occupational Therapy Evaluation and Discharge Patient Details Name: Chelsea Callahan MRN: [id number] DOB: 10/21/1950 Today's Date: 09/14/2013    History of Present Illness pt presents with L TKA and indicates plan for R TKA in future.     Clinical Impression   PTA pt lived at home with husband and was independent with ADLs and functional mobility. Pt progressing well with functional mobility and education and training completed for safety and independence with ADLs. Pt reports husband will be home to assist 24/7 with ADLs. No further acute OT needs.     Follow Up Recommendations  No OT follow up;Supervision/Assistance - 24 hour    Equipment Recommendations  3 in 1 bedside comode;Other (comment) (RW (pt may have one but it is very old))       Precautions / Restrictions Precautions Precautions: Knee;Fall Precaution Comments: Educated pt on precautions and incorporating into ADLs. Required Braces or Orthoses: Knee Immobilizer - Left Knee Immobilizer - Left: Other (comment) (with walking only) Restrictions Weight Bearing Restrictions: Yes LLE Weight Bearing: Weight bearing as tolerated      Mobility Bed Mobility               General bed mobility comments: Pt up in recliner before and after session; not assessed at this time.  Transfers Overall transfer level: Needs assistance Equipment used: Rolling walker (2 wheeled) Transfers: Sit to/from Stand Sit to Stand: Min guard         General transfer comment: VC's for technique and hand placement         ADL Overall ADL's : Needs assistance/impaired Eating/Feeding: Independent;Sitting   Grooming: Min guard;Standing   Upper Body Bathing: Set up;Sitting   Lower Body Bathing: Min guard;Sit to/from stand   Upper Body Dressing : Set up;Sitting   Lower Body Dressing: Min guard;Sit to/from stand   Toilet Transfer: Min guard;BSC;RW;Ambulation (sit<>stand)   Toileting- Architect and Hygiene: Min guard;Sit  to/from stand   Tub/ Shower Transfer: Tub transfer;Min guard;Ambulation;Tub bench;Rolling walker (sit<>stand)   Functional mobility during ADLs: Min guard;Rolling walker General ADL Comments: Pt overall Min Guard for functional mobility and LB ADLs at this time. Educated pt on compensatory techniques for LB ADLs and purpose of donning/doffing shoe to facilitate knee flexion. Educated pt on fall prevention strategies, energy conservation techniques, and home safety.       Vision  Per pt report, no change from baseline.                   Perception Perception Perception Tested?: No   Praxis Praxis Praxis tested?: Within functional limits    Pertinent Vitals/Pain No c/o pain.     Hand Dominance Right   Extremity/Trunk Assessment Upper Extremity Assessment Upper Extremity Assessment: Overall WFL for tasks assessed   Lower Extremity Assessment Lower Extremity Assessment: Defer to PT evaluation   Cervical / Trunk Assessment Cervical / Trunk Assessment: Normal   Communication Communication Communication: No difficulties   Cognition Arousal/Alertness: Awake/alert Behavior During Therapy: WFL for tasks assessed/performed Overall Cognitive Status: Within Functional Limits for tasks assessed                                Home Living Family/patient expects to be discharged to:: Private residence Living Arrangements: Spouse/significant other Available Help at Discharge: Family;Available 24 hours/day Type of Home: House Home Access: Stairs to enter [employer] of Steps: 5 Entrance Stairs-Rails: Right Home Layout: Two level;1/2 bath on  main level;Bed/bath upstairs   Alternate Level Stairs-Rails: Right Bathroom Shower/Tub: Tub/shower unit Shower/tub characteristics: Curtain       Home Equipment: Tub bench          Prior Functioning/Environment Level of Independence: Independent                                       End of  Session Equipment Utilized During Treatment: Rolling walker;Left knee immobilizer CPM Left Knee CPM Left Knee: Off  Activity Tolerance: Patient tolerated treatment well Patient left: in chair;with call bell/phone within reach;with family/visitor present   Time: 5597-4163 OT Time Calculation (min): 32 min Charges:  OT General Charges $OT Visit: 1 Procedure OT Evaluation $Initial OT Evaluation Tier I: 1 Procedure OT Treatments $Self Care/Home Management : 8-22 mins  Rae Lips [phone] 09/14/2013, 3:02 PM

## 2013-09-14 NOTE — Progress Notes (Signed)
Subjective: Patient doing well pain is controlled   Objective: Vital signs in last 24 hours: Temp:  [97.6 F (36.4 C)-99 F (37.2 C)] 98.3 F (36.8 C) (05/16 0622) Pulse Rate:  [64-77] 64 (05/16 0622) Resp:  [10-22] 16 (05/16 0622) BP: (121-172)/(53-83) 121/53 mmHg (05/16 0622) SpO2:  [96 %-100 %] 98 % (05/16 0622)  Intake/Output from previous day: 05/15 0701 - 05/16 0700 In: 2607.5 [P.O.:240; I.V.:2367.5] Out: 2700 [Urine:2650; Blood:50] Intake/Output this shift: Total I/O In: -  Out: 150 [Urine:150]  Exam:  Sensation intact distally Intact pulses distally Dorsiflexion/Plantar flexion intact  Labs:  Recent Labs  09/14/13 0610  HGB 9.8*    Recent Labs  09/14/13 0610  WBC 6.7  RBC 3.51*  HCT 30.9*  PLT 246    Recent Labs  09/14/13 0610  NA 139  K 4.9  CL 103  CO2 28  BUN 9  CREATININE 0.76  GLUCOSE 114*  CALCIUM 9.5   No results found for this basename: LABPT, INR,  in the last 72 hours  Assessment/Plan: Patient is doing well 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

## 2013-09-14 NOTE — Progress Notes (Signed)
Physical Therapy Treatment Patient Details Name: Chelsea Callahan MRN: [id number] DOB: 08/28/1950 Today's Date: 09/14/2013    History of Present Illness pt presents with L TKA and indicates plan for R TKA in future.      PT Comments    Patient continues to make steady progress with ambulation. Continues with slow cadence but overall safe gait. Patient did mention that meds made her feel "whoozy" but no LOB.  Patient is unsure of MDs plan but does not feel like she would be safe to DC home tomorrow. Will continue to follow POC and make adjustments as necessary  Follow Up Recommendations  Home health PT;Supervision/Assistance - 24 hour     Equipment Recommendations  None recommended by PT    Recommendations for Other Services       Precautions / Restrictions Precautions Precautions: Knee;Fall Required Braces or Orthoses: Knee Immobilizer - Left Restrictions LLE Weight Bearing: Weight bearing as tolerated    Mobility  Bed Mobility Overal bed mobility: Needs Assistance Bed Mobility: Supine to Sit;Sit to Supine     Supine to sit: HOB elevated;Supervision     General bed mobility comments: Patient up in recliner before and after session  Transfers Overall transfer level: Needs assistance Equipment used: Rolling walker (2 wheeled) Transfers: Sit to/from Stand Sit to Stand: Min guard         General transfer comment: Cues for correct technique and hand placement  Ambulation/Gait Ambulation/Gait assistance: Min guard Ambulation Distance (Feet): 60 Feet Assistive device: Rolling walker (2 wheeled) Gait Pattern/deviations: Step-to pattern;Decreased step length - right;Decreased step length - left Gait velocity: decreased   General Gait Details: Cues for positioning inside of RW.    Stairs            Wheelchair Mobility    Modified Rankin (Stroke Patients Only)       Balance                                    Cognition Arousal/Alertness:  Awake/alert Behavior During Therapy: WFL for tasks assessed/performed Overall Cognitive Status: Within Functional Limits for tasks assessed                      Exercises Total Joint Exercises Ankle Circles/Pumps: AROM;Left;10 reps Quad Sets: AROM;Left;10 reps Heel Slides: AAROM;Left;10 reps Hip ABduction/ADduction: AAROM;Left;10 reps Straight Leg Raises: AAROM;Left;10 reps Long Arc Quad: AROM;Left;10 reps    General Comments        Pertinent Vitals/Pain no apparent distress     Home Living                      Prior Function            PT Goals (current goals can now be found in the care plan section) Progress towards PT goals: Progressing toward goals    Frequency  7X/week    PT Plan Current plan remains appropriate    Co-evaluation             End of Session Equipment Utilized During Treatment: Gait belt Activity Tolerance: Patient tolerated treatment well Patient left: in chair;with call bell/phone within reach     Time: 1331-1356 PT Time Calculation (min): 25 min  Charges:  $Gait Training: 8-22 mins $Therapeutic Exercise: 8-22 mins                    G Codes:  Adline Potter Yoan Sallade 09/14/2013, 1:59 PM  09/14/2013 Adline Potter Wilhelmena Zea PTA [phone] pager [phone] office

## 2013-09-14 NOTE — Progress Notes (Signed)
Physical Therapy Treatment Patient Details Name: Chelsea Callahan MRN: [id number] DOB: 08-30-1950 Today's Date: 09/14/2013    History of Present Illness pt presents with L TKA and indicates plan for R TKA in future.      PT Comments    Patient appears more motivated today to work with therapy. Able to walk to door and back. Will work on progressing ambulation later this afternoon.   Follow Up Recommendations  Home health PT;Supervision/Assistance - 24 hour     Equipment Recommendations  None recommended by PT    Recommendations for Other Services       Precautions / Restrictions Precautions Precautions: Knee;Fall Required Braces or Orthoses: Knee Immobilizer - Left Restrictions Weight Bearing Restrictions: Yes LLE Weight Bearing: Weight bearing as tolerated    Mobility  Bed Mobility Overal bed mobility: Needs Assistance Bed Mobility: Supine to Sit;Sit to Supine     Supine to sit: HOB elevated;Supervision     General bed mobility comments: Patient able to manuever her leg off of bed with cues for positioning  Transfers Overall transfer level: Needs assistance Equipment used: Rolling walker (2 wheeled) Transfers: Sit to/from Stand Sit to Stand: Min guard         General transfer comment: Cues for correct technique and hand placement  Ambulation/Gait Ambulation/Gait assistance: Min guard Ambulation Distance (Feet): 30 Feet Assistive device: Rolling walker (2 wheeled) Gait Pattern/deviations: Step-to pattern;Trunk flexed Gait velocity: decreased   General Gait Details: Cues for gait sequence, use of RW and posture.    Stairs            Wheelchair Mobility    Modified Rankin (Stroke Patients Only)       Balance                                    Cognition Arousal/Alertness: Awake/alert Behavior During Therapy: WFL for tasks assessed/performed Overall Cognitive Status: Within Functional Limits for tasks assessed                       Exercises Total Joint Exercises Ankle Circles/Pumps: AROM;Left;10 reps Quad Sets: AROM;Left;10 reps Heel Slides: AAROM;Left;10 reps Hip ABduction/ADduction: AAROM;Left;10 reps Straight Leg Raises: AAROM;Left;10 reps    General Comments        Pertinent Vitals/Pain no apparent distress     Home Living                      Prior Function            PT Goals (current goals can now be found in the care plan section) Progress towards PT goals: Progressing toward goals    Frequency  7X/week    PT Plan Current plan remains appropriate    Co-evaluation             End of Session Equipment Utilized During Treatment: Gait belt Activity Tolerance: Patient tolerated treatment well Patient left: in chair;with call bell/phone within reach     Time: 8466-5993 PT Time Calculation (min): 29 min  Charges:  $Gait Training: 8-22 mins $Therapeutic Exercise: 8-22 mins                    G Codes:      Adline Potter Robinette 09/14/2013, 10:21 AM 09/14/2013 Adline Potter Robinette PTA [phone] pager [phone] office

## 2013-09-15 LAB — CBC
HCT: 29.6 % — ABNORMAL LOW (ref 36.0–46.0)
Hemoglobin: 9.4 g/dL — ABNORMAL LOW (ref 12.0–15.0)
MCH: 28.1 pg (ref 26.0–34.0)
MCHC: 31.8 g/dL (ref 30.0–36.0)
MCV: 88.4 fL (ref 78.0–100.0)
Platelets: 263 10*3/uL (ref 150–400)
RBC: 3.35 MIL/uL — AB (ref 3.87–5.11)
RDW: 14.4 % (ref 11.5–15.5)
WBC: 7.5 10*3/uL (ref 4.0–10.5)

## 2013-09-15 MED ORDER — OXYCODONE HCL ER 10 MG PO T12A
10.0000 mg | EXTENDED_RELEASE_TABLET | Freq: Two times a day (BID) | ORAL | Status: DC
  Administered 2013-09-15 – 2013-09-16 (×3): 10 mg via ORAL
  Filled 2013-09-15 (×3): qty 1

## 2013-09-15 NOTE — Progress Notes (Signed)
Physical Therapy Treatment Patient Details Name: Chelsea Callahan MRN: [id number] DOB: May 01, 1951 Today's Date: 11-Oct-2013    History of Present Illness pt presents with L TKA and indicates plan for R TKA in future.      PT Comments    Pt progressing towards goals. Moves very slow, with guarded/cautious movements, however is steady. Stair education done this session. Pt able to go up/down 5 today. Has a flight at home so may need more practice with increased number of steps. Pt up with NT on way to bathroom on arrival to room without knee immobilizer on. Kept off for session without instance of knee instability or buckling noted.   Follow Up Recommendations  Home health PT;Supervision/Assistance - 24 hour     Equipment Recommendations  None recommended by PT    Precautions / Restrictions Precautions Precautions: Knee;Fall Precaution Comments: Reinforced no pillow under knee Required Braces or Orthoses: Knee Immobilizer - Left Knee Immobilizer - Left: On when out of bed or walking Restrictions LLE Weight Bearing: Weight bearing as tolerated    Mobility  Bed Mobility Overal bed mobility: Needs Assistance Bed Mobility: Supine to Sit     Supine to sit: Min guard Sit to supine: Supervision   General bed mobility comments: bed flat and no rail used: pt shown to use belt to lift left leg onto bed surface. increased time needed with cues on sequence and technique.  Transfers Overall transfer level: Modified independent Equipment used: Rolling walker (2 wheeled) Transfers: Sit to/from Stand Sit to Stand: Modified independent (Device/Increase time)         General transfer comment: demo'd safe technique  Ambulation/Gait Ambulation/Gait assistance: Supervision Ambulation Distance (Feet): 110 Feet Assistive device: Rolling walker (2 wheeled) Gait Pattern/deviations: Step-to pattern;Antalgic;Decreased step length - right;Decreased stance time - left Gait velocity: decreased Gait  velocity interpretation: Below normal speed for age/gender General Gait Details: Cues for positioning inside of RW.    Stairs Stairs: Yes Stairs assistance: Min guard Stair Management: Two rails;Step to pattern;Forwards Number of Stairs: 5 General stair comments: cues on sequence and technique with stairs.       Cognition Arousal/Alertness: Awake/alert Behavior During Therapy: WFL for tasks assessed/performed Overall Cognitive Status: Within Functional Limits for tasks assessed                      Exercises Total Joint Exercises Ankle Circles/Pumps: AROM;Both;10 reps;Supine Quad Sets: AROM;Left;10 reps;Supine Short Arc Quad: AAROM;Strengthening;Left;10 reps;Supine Heel Slides: AAROM;Strengthening;Left;10 reps;Supine Hip ABduction/ADduction: AAROM;Strengthening;Left;10 reps;Supine Straight Leg Raises: AAROM;Strengthening;Left;10 reps;Supine        PT Goals (current goals can now be found in the care plan section) Acute Rehab PT Goals Patient Stated Goal: Get this one better because the R knee needs replacing.   PT Goal Formulation: With patient Time For Goal Achievement: 09/20/13 Potential to Achieve Goals: Good Progress towards PT goals: Progressing toward goals    Frequency  7X/week    PT Plan Current plan remains appropriate       End of Session Equipment Utilized During Treatment: Gait belt Activity Tolerance: Patient tolerated treatment well Patient left: in bed;with call bell/phone within reach     Time: 7829-5621 PT Time Calculation (min): 44 min  Charges:  $Gait Training: 23-37 mins $Therapeutic Exercise: 8-22 mins $Therapeutic Activity: 8-22 mins                    G Codes:      Sallyanne Kuster 2013/10/11, 2:27 PM  Olegario Messier  Clearence Ped, [place] Office- [phone]

## 2013-09-15 NOTE — Progress Notes (Signed)
Physical Therapy Treatment Patient Details Name: Chelsea Callahan MRN: [id number] DOB: Feb 19, 1951 Today's Date: 09-28-2013    History of Present Illness pt presents with L TKA and indicates plan for R TKA in future.      PT Comments    Pt making great progress with mobility toward goals. Will plan for stair education in pm session.  Follow Up Recommendations  Home health PT;Supervision/Assistance - 24 hour     Equipment Recommendations  None recommended by PT    Precautions / Restrictions Precautions Precautions: Knee;Fall Precaution Comments: Reinforced no pillow under knee Required Braces or Orthoses: Knee Immobilizer - Left Knee Immobilizer - Left: On when out of bed or walking Restrictions LLE Weight Bearing: Weight bearing as tolerated    Mobility  Bed Mobility Overal bed mobility: Needs Assistance Bed Mobility: Supine to Sit     Supine to sit: Min guard     General bed mobility comments: bed flat and no rail used. cues on technique needed.  Transfers Overall transfer level: Needs assistance Equipment used: Rolling walker (2 wheeled) Transfers: Sit to/from Stand Sit to Stand: Supervision         General transfer comment: demo'd safe technique  Ambulation/Gait Ambulation/Gait assistance: Min guard;Supervision Ambulation Distance (Feet): 150 Feet Assistive device: Rolling walker (2 wheeled) Gait Pattern/deviations: Step-through pattern;Antalgic;Decreased stride length Gait velocity: decreased Gait velocity interpretation: Below normal speed for age/gender General Gait Details: Cues for positioning inside of RW.         Cognition Arousal/Alertness: Awake/alert Behavior During Therapy: WFL for tasks assessed/performed Overall Cognitive Status: Within Functional Limits for tasks assessed                      Exercises Total Joint Exercises Ankle Circles/Pumps: AROM;Both;10 reps;Supine Quad Sets: AROM;Left;10 reps;Supine Short Arc Quad:  AAROM;Strengthening;Left;10 reps;Supine Heel Slides: AAROM;Strengthening;Left;10 reps;Supine Hip ABduction/ADduction: AAROM;Strengthening;Left;10 reps;Supine Straight Leg Raises: AAROM;Strengthening;Left;10 reps;Supine        PT Goals (current goals can now be found in the care plan section) Acute Rehab PT Goals Patient Stated Goal: Get this one better because the R knee needs replacing.   PT Goal Formulation: With patient Time For Goal Achievement: 09/20/13 Potential to Achieve Goals: Good Progress towards PT goals: Progressing toward goals    Frequency  7X/week    PT Plan Current plan remains appropriate    End of Session Equipment Utilized During Treatment: Gait belt;Left knee immobilizer Activity Tolerance: Patient tolerated treatment well Patient left: in chair;with call bell/phone within reach     Time: 1009-1050 PT Time Calculation (min): 41 min  Charges:  $Gait Training: 23-37 mins $Therapeutic Exercise: 8-22 mins                    G Codes:      Sallyanne Kuster Sep 28, 2013, 1:07 PM  Sallyanne Kuster, PTA Office- [phone]

## 2013-09-15 NOTE — Progress Notes (Signed)
Subjective: Pt stable - pain not well controlled - cpm 65   Objective: Vital signs in last 24 hours: Temp:  [98.1 F (36.7 C)-99.7 F (37.6 C)] 99 F (37.2 C) (05/17 0614) Pulse Rate:  [74-82] 76 (05/17 0614) Resp:  [16-18] 16 (05/17 0614) BP: (121-129)/(60-64) 129/64 mmHg (05/17 0614) SpO2:  [96 %-100 %] 100 % (05/17 0614)  Intake/Output from previous day: 05/16 0701 - 05/17 0700 In: 720 [P.O.:720] Out: 150 [Urine:150] Intake/Output this shift:    Exam:  Intact pulses distally Dorsiflexion/Plantar flexion intact  Labs:  Recent Labs  09/14/13 0610 09/15/13 0557  HGB 9.8* 9.4*    Recent Labs  09/14/13 0610 09/15/13 0557  WBC 6.7 7.5  RBC 3.51* 3.35*  HCT 30.9* 29.6*  PLT 246 263    Recent Labs  09/14/13 0610  NA 139  K 4.9  CL 103  CO2 28  BUN 9  CREATININE 0.76  GLUCOSE 114*  CALCIUM 9.5   No results found for this basename: LABPT, INR,  in the last 72 hours  Assessment/Plan: Plan to and long acting oxy - dc mso4 - flexion very good at this time post op   Cammy Copa 09/15/2013, 9:46 AM

## 2013-09-16 ENCOUNTER — Encounter (HOSPITAL_COMMUNITY): Payer: Self-pay

## 2013-09-16 LAB — CBC
HCT: 28.9 % — ABNORMAL LOW (ref 36.0–46.0)
Hemoglobin: 9.1 g/dL — ABNORMAL LOW (ref 12.0–15.0)
MCH: 27.9 pg (ref 26.0–34.0)
MCHC: 31.5 g/dL (ref 30.0–36.0)
MCV: 88.7 fL (ref 78.0–100.0)
PLATELETS: 255 10*3/uL (ref 150–400)
RBC: 3.26 MIL/uL — ABNORMAL LOW (ref 3.87–5.11)
RDW: 14.4 % (ref 11.5–15.5)
WBC: 7.4 10*3/uL (ref 4.0–10.5)

## 2013-09-16 NOTE — Progress Notes (Signed)
Physical Therapy Treatment Patient Details Name: Chelsea Callahan MRN: [id number] DOB: 11/19/1950 Today's Date: 09/16/2013    History of Present Illness pt presents with L TKA and indicates plan for R TKA in future.      PT Comments    Patient continues to ambulate slowly, otherwise moving well with all aspects of mobility. Continue to recommend HHPT when discharged.   Follow Up Recommendations  Home health PT;Supervision/Assistance - 24 hour     Equipment Recommendations  None recommended by PT    Recommendations for Other Services       Precautions / Restrictions Precautions Precautions: Knee;Fall Restrictions LLE Weight Bearing: Weight bearing as tolerated    Mobility  Bed Mobility Overal bed mobility: Needs Assistance       Supine to sit: Supervision        Transfers Overall transfer level: Modified independent               General transfer comment: demo'd safe technique  Ambulation/Gait Ambulation/Gait assistance: Supervision Ambulation Distance (Feet): 100 Feet Assistive device: Rolling walker (2 wheeled) Gait Pattern/deviations: Step-to pattern Gait velocity: decreased Gait velocity interpretation: Below normal speed for age/gender General Gait Details: Demos safe technique. Does not feel she can ambulate any quicker due to pain   Stairs         General stair comments: Patient felt comfortable with stairs and stated did not need to review.   Wheelchair Mobility    Modified Rankin (Stroke Patients Only)       Balance                                    Cognition Arousal/Alertness: Awake/alert Behavior During Therapy: WFL for tasks assessed/performed Overall Cognitive Status: Within Functional Limits for tasks assessed                      Exercises Total Joint Exercises Quad Sets: AROM;Left;10 reps Heel Slides: Strengthening;Left;10 reps;AROM Hip ABduction/ADduction: AROM;Left;10 reps Straight Leg Raises:  AROM;Left;10 reps Long Arc Quad: AROM;Left;10 reps    General Comments        Pertinent Vitals/Pain 8/10 L knee. RN aware    Home Living                      Prior Function            PT Goals (current goals can now be found in the care plan section) Progress towards PT goals: Progressing toward goals    Frequency  7X/week    PT Plan Current plan remains appropriate    Co-evaluation             End of Session Equipment Utilized During Treatment: Gait belt Activity Tolerance: Patient tolerated treatment well Patient left: in chair;with call bell/phone within reach     Time: 0833-0857 PT Time Calculation (min): 24 min  Charges:  $Gait Training: 8-22 mins $Therapeutic Exercise: 8-22 mins                    G Codes:      Adline Potter Robinette 09/16/2013, 9:03 AM 09/16/2013 Adline Potter Robinette PTA [phone] pager [phone] office

## 2013-09-16 NOTE — Progress Notes (Signed)
Subjective: 3 Days Post-Op Procedure(s) (LRB): LEFT COMPUTER ASSISTED TOTAL KNEE ARTHROPLASTY (Left) Patient reports pain as mild.    Objective: Vital signs in last 24 hours: Temp:  [99.1 F (37.3 C)-99.7 F (37.6 C)] 99.1 F (37.3 C) (05/18 0600) Pulse Rate:  [70-80] 75 (05/18 0600) Resp:  [16-18] 18 (05/18 0600) BP: (117-130)/(48-52) 121/51 mmHg (05/18 0600) SpO2:  [100 %] 100 % (05/18 0600)  Intake/Output from previous day: 05/17 0701 - 05/18 0700 In: 3543.8 [P.O.:480; I.V.:3063.8] Out: -  Intake/Output this shift: Total I/O In: 240 [P.O.:240] Out: -    Recent Labs  09/14/13 0610 09/15/13 0557 09/16/13 0630  HGB 9.8* 9.4* 9.1*    Recent Labs  09/15/13 0557 09/16/13 0630  WBC 7.5 7.4  RBC 3.35* 3.26*  HCT 29.6* 28.9*  PLT 263 255    Recent Labs  09/14/13 0610  NA 139  K 4.9  CL 103  CO2 28  BUN 9  CREATININE 0.76  GLUCOSE 114*  CALCIUM 9.5   No results found for this basename: LABPT, INR,  in the last 72 hours  Neurovascular intact Sensation intact distally Intact pulses distally Dorsiflexion/Plantar flexion intact Incision: no drainage  Assessment/Plan: 3 Days Post-Op Procedure(s) (LRB): LEFT COMPUTER ASSISTED TOTAL KNEE ARTHROPLASTY (Left) Up with therapy Discharge home with home health Pt wants CPM for home.  Will arrange OV 2 weeks rx percocet and robaxin on chart.  Wende Neighbors 09/16/2013, 10:29 AM

## 2013-09-16 NOTE — Care Management Note (Signed)
CARE MANAGEMENT NOTE 09/16/2013  Patient:  QUADASIA, NEWSHAM   Account Number:  [id number]  Date Initiated:  09/16/2013  Documentation initiated by:  Vance Peper  Subjective/Objective Assessment:   63 yr old female admitted with left knee rheumatoid arthritis, s/p left total knee arthroplasty.     Action/Plan:   Case manager spoke with patient concerning home health and DME needs. Referral called to [facility], [facility] liason. patient has support at discharge.   Anticipated DC Date:  09/16/2013   Anticipated DC Plan:  HOME W HOME HEALTH SERVICES      DC Planning Services  CM consult      [facility] Choice  HOME HEALTH  DURABLE MEDICAL EQUIPMENT   Choice offered to / List presented to:  C-1 Patient   DME arranged  3-N-1  [facility]  CPM      DME agency  [employer]     HH arranged  HH-2 PT      HH agency  [facility]   Status of service:  Completed, signed off Medicare Important Message given?   (If response is "NO", the following Medicare IM given date fields will be blank) Date Medicare IM given:   Date Additional Medicare IM given:    Discharge Disposition:    Per UR Regulation:    If discussed at Long Length of Stay Meetings, dates discussed:    Comments:

## 2013-09-17 NOTE — Care Management Note (Signed)
09/17/13 10:00am Vance Peper, RN BSN Case Manager Case Manager received a call from [facility], [facility] statting that they will not be able to provide Home Health PT due to patient's insurance. [place] [employer] [place] [employer] [place]. CM contacted [facility], they would not be able to service patient until Thursday 09/19/13. CM contacted [facility], faxed orders and demographics to [facility] [phone]. She stated that she has received authorization for a visit and will make determination for further visits. Case Manager contacted Ninfa Linden, PA to inform her of situation. Also contacted patient to inform her of changes and that should this not work out she is to Psychologist, prison and probation services at Dr.

## 2013-09-24 DIAGNOSIS — M359 Systemic involvement of connective tissue, unspecified: Secondary | ICD-10-CM

## 2013-09-24 DIAGNOSIS — M069 Rheumatoid arthritis, unspecified: Secondary | ICD-10-CM | POA: Insufficient documentation

## 2013-09-24 NOTE — Discharge Summary (Signed)
Physician Discharge Summary  Patient ID: Chelsea Callahan MRN: [id number] DOB/AGE: Jan 28, 1951 63 y.o.  Admit date: 09/13/2013 Discharge date: 09/16/2013  Admission Diagnoses:  Osteoarthritis of left knee  Discharge Diagnoses:  Principal Problem:   Osteoarthritis of left knee Active Problems:   Rheumatoid arthritis Acute blood loss anemia  Past Medical History  Diagnosis Date  . Spinal headache   . RA (rheumatoid arthritis)   . Anemia     Surgeries: Procedure(s): LEFT COMPUTER ASSISTED TOTAL KNEE ARTHROPLASTY on 09/13/2013   Consultants (if any):  none  Discharged Condition: Improved  Hospital Course: Chelsea Callahan is an 63 y.o. female who was admitted 09/13/2013 with a diagnosis of Osteoarthritis of left knee and went to the operating room on 09/13/2013 and underwent the above named procedures.    She was given perioperative antibiotics:  Anti-infectives   Start     Dose/Rate Route Frequency Ordered Stop   09/14/13 1000  hydroxychloroquine (PLAQUENIL) tablet 200 mg  Status:  Discontinued     200 mg Oral 2 times daily 09/13/13 1116 09/16/13 1713   09/13/13 2200  hydroxychloroquine (PLAQUENIL) tablet 200 mg     200 mg Oral  Once 09/13/13 1501 09/13/13 2244   09/13/13 0600  ceFAZolin (ANCEF) IVPB 2 g/50 mL premix     2 g 100 mL/hr over 30 Minutes Intravenous On call to O.R. 09/12/13 1433 09/13/13 0726    .  She was given sequential compression devices, early ambulation, and aspirin for DVT prophylaxis.  She benefited maximally from the hospital stay and there were no complications.    Recent vital signs:  Filed Vitals:   09/16/13 0600  BP: 121/51  Pulse: 75  Temp: 99.1 F (37.3 C)  Resp: 18    Recent laboratory studies:  Lab Results  Component Value Date   HGB 9.1* 09/16/2013   HGB 9.4* 09/15/2013   HGB 9.8* 09/14/2013   Lab Results  Component Value Date   WBC 7.4 09/16/2013   PLT 255 09/16/2013   Lab Results  Component Value Date   INR 0.96 09/05/2013   Lab  Results  Component Value Date   NA 139 09/14/2013   K 4.9 09/14/2013   CL 103 09/14/2013   CO2 28 09/14/2013   BUN 9 09/14/2013   CREATININE 0.76 09/14/2013   GLUCOSE 114* 09/14/2013    Discharge Medications:     Medication List         aspirin EC 325 MG tablet  Take 1 tablet (325 mg total) by mouth daily.     hydroxychloroquine 200 MG tablet  Commonly known as:  PLAQUENIL  Take by mouth 2 (two) times daily.     methocarbamol 500 MG tablet  Commonly known as:  ROBAXIN  Take 1 tablet (500 mg total) by mouth every 6 (six) hours as needed for muscle spasms (spasm).     multivitamin with minerals tablet  Take 1 tablet by mouth daily.     OVER THE COUNTER MEDICATION  Take 1 tablet by mouth daily. [place]     oxyCODONE-acetaminophen 5-325 MG per tablet  Commonly known as:  ROXICET  Take 1-2 tablets by mouth every 4 (four) hours as needed for moderate pain.     predniSONE 5 MG tablet  Commonly known as:  DELTASONE  Take 5 mg by mouth daily with breakfast.     TART CHERRY ADVANCED Caps  Take 1 capsule by mouth daily.        Diagnostic Studies: Dg Chest 2  View  09/13/2013   CLINICAL DATA:  Preop  EXAM: CHEST  2 VIEW  COMPARISON:  None.  FINDINGS: The cardiac and mediastinal silhouettes are within normal limits. Atherosclerotic calcifications noted within the aortic arch.  The lungs are normally inflated. No airspace consolidation, pleural effusion, or pulmonary edema is identified. There is no pneumothorax.  No acute osseous abnormality identified.  IMPRESSION: No acute cardiopulmonary abnormality.   Electronically Signed   By: Rise Mu M.D.   On: 09/13/2013 06:45    Disposition: 01-Home or Self Care      Discharge Instructions   CPM    Complete by:  As directed   Continuous passive motion machine (CPM):      Use the CPM from 0 to 60-90 for 2-4 hours per day.      You may increase by 10-15 per day.  You may break it up into 2 or 3 sessions per day.      Use  CPM for 2 weeks or until you are told to stop.     Call MD / Call 911    Complete by:  As directed   If you experience chest pain or shortness of breath, CALL 911 and be transported to the hospital emergency room.  If you develope a fever above 101 F, pus (white drainage) or increased drainage or redness at the wound, or calf pain, call your surgeon's office.     Constipation Prevention    Complete by:  As directed   Drink plenty of fluids.  Prune juice may be helpful.  You may use a stool softener, such as Colace (over the counter) 100 mg twice a day.  Use MiraLax (over the counter) for constipation as needed.     Diet - low sodium heart healthy    Complete by:  As directed      Discharge instructions    Complete by:  As directed   Keep knee incision dry for 5 days post op then may wet while bathing. Therapy daily and CPM goal full extension and greater than 90 degrees flexion. Call if fever or chills or increased drainage. Go to ER if acutely short of breath or call for ambulance. Return for follow up in 2 weeks. May full weight bear on the surgical leg unless told otherwise. Use knee immobilizer until able to straight leg raise off bed with knee stable. In house walking for first 2 weeks.     Do not put a pillow under the knee. Place it under the heel.    Complete by:  As directed      Full weight bearing    Complete by:  As directed      Increase activity slowly as tolerated    Complete by:  As directed            Follow-up Information   Follow up with Eldred Manges, MD. Schedule an appointment as soon as possible for a visit in 2 weeks.   Specialty:  Orthopedic Surgery   Contact information:   [address] [address] [place] [address] [phone]       Follow up with [facility]. (Someone from [facility] will contact you concerning start date and time for physical therapy.)    Contact information:   [address] [place]  [address] [phone]        Signed: Wende Neighbors 09/24/2013, 3:27 PM

## 2013-10-25 NOTE — OR Nursing (Signed)
Addendum to scope page 

## 2014-04-15 ENCOUNTER — Other Ambulatory Visit (HOSPITAL_COMMUNITY): Payer: Self-pay | Admitting: Orthopaedic Surgery

## 2014-05-02 DIAGNOSIS — Z96659 Presence of unspecified artificial knee joint: Secondary | ICD-10-CM | POA: Insufficient documentation

## 2014-05-12 NOTE — Pre-Procedure Instructions (Signed)
Chelsea Callahan  05/12/2014   Your procedure is scheduled on:  Friday, January 22nd  Report to [facility] Admitting at 530 AM.  Call this number if you have problems the morning of surgery: [phone]   Remember:   Do not eat food or drink liquids after midnight.   Take these medicines the morning of surgery with A SIP OF WATER: none   Do not wear jewelry, make-up or nail polish.  Do not wear lotions, powders, or perfumes,deodorant.  Do not shave 48 hours prior to surgery. Men may shave face and neck.  Do not bring valuables to the hospital.  [facility] is not responsible  for any belongings or valuables.               Contacts, dentures or bridgework may not be worn into surgery.  Leave suitcase in the car. After surgery it may be brought to your room.  For patients admitted to the hospital, discharge time is determined by your  treatment team.           Please read over the following fact sheets that you were given: Pain Booklet, Coughing and Deep Breathing, MRSA Information and Surgical Site Infection Prevention  [facility] - Preparing for Surgery  Before surgery, you can play an important role.  Because skin is not sterile, your skin needs to be as free of germs as possible.  You can reduce the number of germs on you skin by washing with CHG (chlorahexidine gluconate) soap before surgery.  CHG is an antiseptic cleaner which kills germs and bonds with the skin to continue killing germs even after washing.  Please DO NOT use if you have an allergy to CHG or antibacterial soaps.  If your skin becomes reddened/irritated stop using the CHG and inform your nurse when you arrive at Short Stay.  Do not shave (including legs and underarms) for at least 48 hours prior to the first CHG shower.  You may shave your face.  Please follow these instructions carefully:   1.  Shower with CHG Soap the night before surgery and the morning of Surgery.  2.  If you choose to wash your hair,  wash your hair first as usual with your normal shampoo.  3.  After you shampoo, rinse your hair and body thoroughly to remove the shampoo.  4.  Use CHG as you would any other liquid soap.  You can apply CHG directly to the skin and wash gently with scrungie or a clean washcloth.  5.  Apply the CHG Soap to your body ONLY FROM THE NECK DOWN.  Do not use on open wounds or open sores.  Avoid contact with your eyes, ears, mouth and genitals (private parts).  Wash genitals (private parts) with your normal soap.  6.  Wash thoroughly, paying special attention to the area where your surgery will be performed.  7.  Thoroughly rinse your body with warm water from the neck down.  8.  DO NOT shower/wash with your normal soap after using and rinsing off the CHG Soap.  9.  Pat yourself dry with a clean towel.            10.  Wear clean pajamas.            11.  Place clean sheets on your bed the night of your first shower and do not sleep with pets.  Day of Surgery  Do not apply any lotions/deoderants the morning of surgery.  Please wear clean clothes to the hospital/surgery center.

## 2014-05-13 ENCOUNTER — Encounter (HOSPITAL_COMMUNITY)
Admission: RE | Admit: 2014-05-13 | Discharge: 2014-05-13 | Disposition: A | Discharge status: Home / Self Care | Payer: BLUE CROSS/BLUE SHIELD | Source: Ambulatory Visit | Attending: Orthopaedic Surgery | Admitting: Orthopaedic Surgery

## 2014-05-13 ENCOUNTER — Encounter (HOSPITAL_COMMUNITY): Payer: Self-pay

## 2014-05-13 DIAGNOSIS — Z01812 Encounter for preprocedural laboratory examination: Secondary | ICD-10-CM | POA: Diagnosis not present

## 2014-05-13 DIAGNOSIS — M1711 Unilateral primary osteoarthritis, right knee: Secondary | ICD-10-CM | POA: Insufficient documentation

## 2014-05-13 HISTORY — DX: Arteritis, unspecified: I77.6

## 2014-05-13 HISTORY — DX: Nausea with vomiting, unspecified: R11.2

## 2014-05-13 HISTORY — DX: Other specified postprocedural states: Z98.890

## 2014-05-13 HISTORY — DX: Raynaud's syndrome without gangrene: I73.00

## 2014-05-13 LAB — COMPREHENSIVE METABOLIC PANEL
ALK PHOS: 77 U/L (ref 39–117)
ALT: 24 U/L (ref 0–35)
ANION GAP: 13 (ref 5–15)
AST: 36 U/L (ref 0–37)
Albumin: 3.7 g/dL (ref 3.5–5.2)
BUN: 14 mg/dL (ref 6–23)
CO2: 23 mmol/L (ref 19–32)
Calcium: 9.8 mg/dL (ref 8.4–10.5)
Chloride: 103 mEq/L (ref 96–112)
Creatinine, Ser: 0.82 mg/dL (ref 0.50–1.10)
GFR calc Af Amer: 87 mL/min — ABNORMAL LOW (ref >90)
GFR calc non Af Amer: 75 mL/min — ABNORMAL LOW (ref >90)
Glucose, Bld: 83 mg/dL (ref 70–99)
POTASSIUM: 4.2 mmol/L (ref 3.5–5.1)
SODIUM: 139 mmol/L (ref 135–145)
TOTAL PROTEIN: 6.4 g/dL (ref 6.0–8.3)
Total Bilirubin: 0.5 mg/dL (ref 0.3–1.2)

## 2014-05-13 LAB — URINE MICROSCOPIC-ADD ON

## 2014-05-13 LAB — URINALYSIS, ROUTINE W REFLEX MICROSCOPIC
Bilirubin Urine: NEGATIVE
Glucose, UA: NEGATIVE mg/dL
Hgb urine dipstick: NEGATIVE
Ketones, ur: NEGATIVE mg/dL
Nitrite: NEGATIVE
PROTEIN: NEGATIVE mg/dL
Specific Gravity, Urine: 1.02 (ref 1.005–1.030)
UROBILINOGEN UA: 0.2 mg/dL (ref 0.0–1.0)
pH: 7 (ref 5.0–8.0)

## 2014-05-13 LAB — CBC
HCT: 35.9 % — ABNORMAL LOW (ref 36.0–46.0)
HEMOGLOBIN: 11.2 g/dL — AB (ref 12.0–15.0)
MCH: 27.2 pg (ref 26.0–34.0)
MCHC: 31.2 g/dL (ref 30.0–36.0)
MCV: 87.1 fL (ref 78.0–100.0)
Platelets: 296 10*3/uL (ref 150–400)
RBC: 4.12 MIL/uL (ref 3.87–5.11)
RDW: 14.5 % (ref 11.5–15.5)
WBC: 7.4 10*3/uL (ref 4.0–10.5)

## 2014-05-13 LAB — SURGICAL PCR SCREEN
MRSA, PCR: NEGATIVE
Staphylococcus aureus: NEGATIVE

## 2014-05-13 LAB — PROTIME-INR
INR: 1.03 (ref 0.00–1.49)
Prothrombin Time: 13.6 seconds (ref 11.6–15.2)

## 2014-05-13 LAB — APTT: aPTT: 34 seconds (ref 24–37)

## 2014-05-13 NOTE — Progress Notes (Signed)
Primary- rheumotologist - dr. Drusilla Kanner No cardiologist ekg in epic from may 2015

## 2014-05-22 HISTORY — PX: TOTAL KNEE ARTHROPLASTY: SHX125

## 2014-05-22 MED ORDER — CEFAZOLIN SODIUM-DEXTROSE 2-3 GM-% IV SOLR
2.0000 g | INTRAVENOUS | Status: AC
  Administered 2014-05-23: 2 g via INTRAVENOUS
  Filled 2014-05-22: qty 50

## 2014-05-22 NOTE — H&P (Signed)
[facility]   A Division of [employer]   [address], [place], [place] [address] Telephone: [phone]  Fax: [phone]     PATIENT: Chelsea Callahan, Chelsea Callahan   MR#: [id number]  DOB: [birth year]   Visit Date: 10/23/2013     Patient returns after a left total knee arthroplasty.  She has had full extension.  She is walking without a limp. She sometimes uses a cane when she goes out in the community.  She is having continued problems with her opposite right knee which shows significant osteoarthritic changes.     FAMILY HISTORY/SOCIAL HISTORY/14-POINT REVIEW OF SYSTEMS/MEDICATIONS:  Reviewed and updated and are unchanged.  She does have problems with Raynaud's.   PHYSICAL EXAMINATION:  Flexion is 105 to 110.  She is happy with the surgical result.  Opposite knee is giving her increased problems. She has been on anti-inflammatories and had cortisone injections previously.  She states she would like to proceed with total knee arthroplasty of her right knee sometime after the holidays.  We reviewed osteoarthritis, bone-on-bone changes with only mild varus deformity of her right knee but bone-on-bone medial and lateral compartment, tricompartmental degenerative changes, degenerative changes at the patellofemoral joint.  She has some loose pieces posteriorly.  She has 1 cm spur on the patellofemoral joint.  Preoperative severe varus deformity of her left knee has been nicely corrected.   PLAN:  She is happy with the surgical result and will call when she is ready to proceed with the opposite knee for procedure for total right knee arthroplasty.  She will call Elnita Maxwell in surgical scheduling when she is ready.   For additional information please see handwritten notes, reports, orders and prescriptions in this chart.      Mark C. Ophelia Charter, M.D.    Auto-Authenticated by Veverly Fells. Ophelia Charter, M.D.  MCY/dr DD: 10/23/2013  DT: 10/24/2013   [facility]   RHEUMATOLOGY    A Division of [employer]   [address], [place], [place] [address] Telephone: [phone] Fax: [phone]    PATIENT: Chelsea Callahan, Chelsea Callahan   MR#: [id number]  DOB: 02-29-1951   Visit Date: 01/31/2014     CHIEF COMPLAINT:  Knee joint pain and swelling.   HPI:  Chelsea Callahan is a 64 year old female with history of autoimmune disease and osteoarthritis.  She states she had been doing quite well except for joint pain.  Recently she has been having occasional joint pain and swelling in her knee joints.  She states she had left total knee replacement by Dr. Ophelia Charter in May and then scheduled to have right total knee replacement in January.  Her Raynaud is starting again with the weather change and she is having increased joint stiffness with the weather change.  She states she is still taking 5 mg of prednisone and sometimes increases the dose when she believes she has hair loss.  She has not had any rash of any kind.  There is no history of oral ulcers, nasal ulcers, malar rash.  MEDICATIONS:  Include prednisone 5 mg a day, Plaquenil 200 mg b.i.d. and supplements.   ALLERGIES:  NO KNOWN DRUG ALLERGIES.    SOCIAL HISTORY:  She smokes 1/3 pack per day for the last 15-20 years, occasional alcohol, drinks coffee, and does exercise.   FAMILY HISTORY:   Nothing new added.    ROS:  Systemic review is remarkable for hair loss, pain and tenderness.  Please refer to rheumatology visit form  for details.      EXAM:  The patient was in no acute distress.  Vital signs were stable.   Skin:  No rash on examination. Chest:  Clear.  Heart:  Regular rate and rhythm. Abdomen:  There was no hepatosplenomegaly.   Musculoskeletal:  She has good range of motion of her C-spine, thoracic and lumbar spine.  She had right total hip replacement and left total knee replacement.  All other joints have full range of motion with no synovitis or synovial thickening.    Fibromyalgia Tender Points:   All absent.   INVESTIGATIONS:  September 2015:  CBC and comprehensive metabolic panel were normal.   IMPRESSION AND PLAN:  Autoimmune disease with positive ANA 1:1 280 nuclear pattern and also positive rheumatoid factor.  She does not have CCP positive.  She has history of arthritis, Raynaud phenomenon and had done really well on Plaquenil.  She was started on prednisone, high dose at one point before she came to see me for possible vasculitis although we never did see vasculitis on examination and she has never had a vasculitis flare.  She has been tapered down on the prednisone and has been on low dose prednisone for a while.  I do not see any vasculitis at this point.  We had detailed discussion regarding coming off prednisone.  I would like to taper it gradually.  She will take 1 mg tablet 4 tablets a day for a month and then taper by 1 mg every month until she gets off the prednisone.  She is on high risk prescription.  She has been given an eye exam and her labs.  According to new recommendation for her height her Plaquenil dose will be 200 mg in the morning and half tablet in the evening.  I have advised her to change her dose also.  She does have right total hip replacement with decreased range of motion, left total knee replacement recent which is doing well.  She does have right knee joint osteoarthritis which causes discomfort and she will be having surgery in January.  Her medical history is complicated by history of gastric bypass in the past.  Since she has had long term prednisone use, I have advised her to get bone density.  She states she will discuss this with her primary care physician.  Face-to-face time spent with the patient was 30 minutes, 50% time was spent in counseling.      Pollyann Savoy, MD    Auto-Authenticated by Pollyann Savoy, MD  SD/cd DD: 01/31/2014  DT: 02/03/2014

## 2014-05-22 NOTE — Anesthesia Preprocedure Evaluation (Addendum)
Anesthesia Evaluation  Patient identified by MRN, date of birth, ID band Patient awake    Reviewed: Allergy & Precautions, NPO status , Patient's Chart, lab work & pertinent test results, reviewed documented beta blocker date and time   History of Anesthesia Complications (+) PONV  Airway Mallampati: II   Neck ROM: Full    Dental  (+) Edentulous Upper, Dental Advisory Given   Pulmonary former smoker,  breath sounds clear to auscultation        Cardiovascular negative cardio ROS  Rhythm:Regular  EKG brady   Neuro/Psych    GI/Hepatic negative GI ROS, Neg liver ROS,   Endo/Other  negative endocrine ROS  Renal/GU negative Renal ROS     Musculoskeletal   Abdominal (+) + obese,   Peds  Hematology negative hematology ROS (+)   Anesthesia Other Findings   Reproductive/Obstetrics                            Anesthesia Physical Anesthesia Plan  ASA: II  Anesthesia Plan: General   Post-op Pain Management: MAC Combined w/ Regional for Post-op pain   Induction: Intravenous  Airway Management Planned:   Additional Equipment:   Intra-op Plan:   Post-operative Plan: Extubation in OR  Informed Consent: I have reviewed the patients History and Physical, chart, labs and discussed the procedure including the risks, benefits and alternatives for the proposed anesthesia with the patient or authorized representative who has indicated his/her understanding and acceptance.     Plan Discussed with:   Anesthesia Plan Comments:         Anesthesia Quick Evaluation

## 2014-05-23 ENCOUNTER — Inpatient Hospital Stay (HOSPITAL_COMMUNITY)
Admission: RE | Admit: 2014-05-23 | Discharge: 2014-05-27 | DRG: 470 | Disposition: A | Discharge status: Home / Self Care | Payer: BLUE CROSS/BLUE SHIELD | Source: Ambulatory Visit | Attending: Orthopaedic Surgery | Admitting: Orthopaedic Surgery

## 2014-05-23 ENCOUNTER — Encounter (HOSPITAL_COMMUNITY)
Admission: RE | Disposition: A | Discharge status: Home / Self Care | Payer: Self-pay | Source: Ambulatory Visit | Attending: Orthopaedic Surgery

## 2014-05-23 ENCOUNTER — Encounter (HOSPITAL_COMMUNITY): Payer: Self-pay | Admitting: *Deleted

## 2014-05-23 ENCOUNTER — Inpatient Hospital Stay (HOSPITAL_COMMUNITY): Payer: BLUE CROSS/BLUE SHIELD | Admitting: Anesthesiology

## 2014-05-23 DIAGNOSIS — Z9884 Bariatric surgery status: Secondary | ICD-10-CM | POA: Diagnosis not present

## 2014-05-23 DIAGNOSIS — F17210 Nicotine dependence, cigarettes, uncomplicated: Secondary | ICD-10-CM | POA: Diagnosis present

## 2014-05-23 DIAGNOSIS — D62 Acute posthemorrhagic anemia: Secondary | ICD-10-CM | POA: Diagnosis not present

## 2014-05-23 DIAGNOSIS — I7300 Raynaud's syndrome without gangrene: Secondary | ICD-10-CM | POA: Diagnosis present

## 2014-05-23 DIAGNOSIS — M25561 Pain in right knee: Secondary | ICD-10-CM | POA: Diagnosis present

## 2014-05-23 DIAGNOSIS — Z96653 Presence of artificial knee joint, bilateral: Secondary | ICD-10-CM

## 2014-05-23 DIAGNOSIS — M1711 Unilateral primary osteoarthritis, right knee: Secondary | ICD-10-CM | POA: Diagnosis present

## 2014-05-23 HISTORY — PX: KNEE ARTHROPLASTY: SHX992

## 2014-05-23 HISTORY — DX: Unilateral primary osteoarthritis, right knee: M17.11

## 2014-05-23 SURGERY — ARTHROPLASTY, KNEE, TOTAL, USING IMAGELESS COMPUTER-ASSISTED NAVIGATION
Anesthesia: General | Laterality: Right

## 2014-05-23 MED ORDER — NEOSTIGMINE METHYLSULFATE 10 MG/10ML IV SOLN
INTRAVENOUS | Status: DC | PRN
  Administered 2014-05-23: 4 mg via INTRAVENOUS

## 2014-05-23 MED ORDER — PROMETHAZINE HCL 25 MG/ML IJ SOLN
6.2500 mg | INTRAMUSCULAR | Status: DC | PRN
Start: 2014-05-23 — End: 2014-05-23
  Administered 2014-05-23: 6.25 mg via INTRAVENOUS

## 2014-05-23 MED ORDER — DEXAMETHASONE SODIUM PHOSPHATE 4 MG/ML IJ SOLN
INTRAMUSCULAR | Status: DC | PRN
Start: 2014-05-23 — End: 2014-05-23
  Administered 2014-05-23: 8 mg via INTRAVENOUS

## 2014-05-23 MED ORDER — FENTANYL CITRATE 0.05 MG/ML IJ SOLN
INTRAMUSCULAR | Status: AC
  Filled 2014-05-23: qty 5

## 2014-05-23 MED ORDER — HYDROCORTISONE NA SUCCINATE PF 100 MG IJ SOLR
INTRAMUSCULAR | Status: DC | PRN
  Administered 2014-05-23: 100 mg via INTRAVENOUS

## 2014-05-23 MED ORDER — MIDAZOLAM HCL 2 MG/2ML IJ SOLN
INTRAMUSCULAR | Status: AC
  Filled 2014-05-23: qty 2

## 2014-05-23 MED ORDER — PHENOL 1.4 % MT LIQD: 1.0000 | OROMUCOSAL | Status: DC | PRN

## 2014-05-23 MED ORDER — BISACODYL 10 MG RE SUPP: 10.0000 mg | Freq: Every day | RECTAL | Status: DC | PRN

## 2014-05-23 MED ORDER — ACETAMINOPHEN 650 MG RE SUPP
650.0000 mg | Freq: Four times a day (QID) | RECTAL | Status: DC | PRN
Start: 2014-05-23 — End: 2014-05-27

## 2014-05-23 MED ORDER — PROMETHAZINE HCL 25 MG/ML IJ SOLN
INTRAMUSCULAR | Status: AC
  Filled 2014-05-23: qty 1

## 2014-05-23 MED ORDER — SCOPOLAMINE 1 MG/3DAYS TD PT72
MEDICATED_PATCH | TRANSDERMAL | Status: AC
  Filled 2014-05-23: qty 1

## 2014-05-23 MED ORDER — OXYCODONE HCL 5 MG PO TABS
5.0000 mg | ORAL_TABLET | ORAL | Status: DC | PRN
  Administered 2014-05-23 – 2014-05-27 (×20): 10 mg via ORAL
  Filled 2014-05-23 (×20): qty 2

## 2014-05-23 MED ORDER — LIDOCAINE HCL (CARDIAC) 20 MG/ML IV SOLN
INTRAVENOUS | Status: AC
  Filled 2014-05-23: qty 5

## 2014-05-23 MED ORDER — HYDROMORPHONE HCL 1 MG/ML IJ SOLN
1.0000 mg | INTRAMUSCULAR | Status: DC | PRN
  Administered 2014-05-23 (×2): 1 mg via INTRAVENOUS
  Administered 2014-05-23: 0.5 mg via INTRAVENOUS
  Administered 2014-05-24 – 2014-05-25 (×5): 1 mg via INTRAVENOUS
  Filled 2014-05-23 (×8): qty 1

## 2014-05-23 MED ORDER — SODIUM CHLORIDE 0.9 % IR SOLN
Status: DC | PRN
  Administered 2014-05-23: 1000 mL

## 2014-05-23 MED ORDER — METHOCARBAMOL 500 MG PO TABS: 500.0000 mg | ORAL_TABLET | Freq: Four times a day (QID) | ORAL | Status: DC | PRN

## 2014-05-23 MED ORDER — METHOCARBAMOL 500 MG PO TABS
500.0000 mg | ORAL_TABLET | Freq: Four times a day (QID) | ORAL | Status: DC | PRN
  Administered 2014-05-23 – 2014-05-27 (×11): 500 mg via ORAL
  Filled 2014-05-23 (×10): qty 1

## 2014-05-23 MED ORDER — MENTHOL 3 MG MT LOZG: 1.0000 | LOZENGE | OROMUCOSAL | Status: DC | PRN

## 2014-05-23 MED ORDER — FENTANYL CITRATE 0.05 MG/ML IJ SOLN
INTRAMUSCULAR | Status: DC | PRN
  Administered 2014-05-23: 25 ug via INTRAVENOUS
  Administered 2014-05-23 (×2): 50 ug via INTRAVENOUS
  Administered 2014-05-23: 100 ug via INTRAVENOUS
  Administered 2014-05-23 (×2): 50 ug via INTRAVENOUS
  Administered 2014-05-23: 25 ug via INTRAVENOUS
  Administered 2014-05-23: 50 ug via INTRAVENOUS
  Administered 2014-05-23 (×2): 100 ug via INTRAVENOUS

## 2014-05-23 MED ORDER — LIDOCAINE HCL (CARDIAC) 20 MG/ML IV SOLN
INTRAVENOUS | Status: DC | PRN
  Administered 2014-05-23: 80 mg via INTRAVENOUS

## 2014-05-23 MED ORDER — ONDANSETRON 4 MG PO TBDP: 4.0000 mg | ORAL_TABLET | Freq: Three times a day (TID) | ORAL | Status: DC | PRN

## 2014-05-23 MED ORDER — GLYCOPYRROLATE 0.2 MG/ML IJ SOLN
INTRAMUSCULAR | Status: AC
  Filled 2014-05-23: qty 3

## 2014-05-23 MED ORDER — LACTATED RINGERS IV SOLN
INTRAVENOUS | Status: DC | PRN
  Administered 2014-05-23 (×2): via INTRAVENOUS

## 2014-05-23 MED ORDER — ONDANSETRON HCL 4 MG/2ML IJ SOLN
INTRAMUSCULAR | Status: AC
  Filled 2014-05-23: qty 2

## 2014-05-23 MED ORDER — OXYCODONE-ACETAMINOPHEN 7.5-325 MG PO TABS: 1.0000 | ORAL_TABLET | Freq: Four times a day (QID) | ORAL | Status: DC | PRN

## 2014-05-23 MED ORDER — PROPOFOL 10 MG/ML IV BOLUS
INTRAVENOUS | Status: AC
  Filled 2014-05-23: qty 20

## 2014-05-23 MED ORDER — GLYCOPYRROLATE 0.2 MG/ML IJ SOLN
INTRAMUSCULAR | Status: DC | PRN
  Administered 2014-05-23: .6 mg via INTRAVENOUS

## 2014-05-23 MED ORDER — METOCLOPRAMIDE HCL 5 MG PO TABS
5.0000 mg | ORAL_TABLET | Freq: Three times a day (TID) | ORAL | Status: DC | PRN
  Filled 2014-05-23: qty 2

## 2014-05-23 MED ORDER — PROPOFOL 10 MG/ML IV BOLUS
INTRAVENOUS | Status: DC | PRN
  Administered 2014-05-23: 150 mg via INTRAVENOUS
  Administered 2014-05-23: 50 mg via INTRAVENOUS

## 2014-05-23 MED ORDER — BUPIVACAINE-EPINEPHRINE (PF) 0.5% -1:200000 IJ SOLN
INTRAMUSCULAR | Status: DC | PRN
  Administered 2014-05-23: 30 mL via PERINEURAL

## 2014-05-23 MED ORDER — SENNOSIDES-DOCUSATE SODIUM 8.6-50 MG PO TABS: 1.0000 | ORAL_TABLET | Freq: Every evening | ORAL | Status: DC | PRN

## 2014-05-23 MED ORDER — POTASSIUM CHLORIDE IN NACL 20-0.45 MEQ/L-% IV SOLN
INTRAVENOUS | Status: DC
  Administered 2014-05-23: 15:00:00 via INTRAVENOUS
  Filled 2014-05-23 (×10): qty 1000

## 2014-05-23 MED ORDER — ROCURONIUM BROMIDE 100 MG/10ML IV SOLN
INTRAVENOUS | Status: DC | PRN
  Administered 2014-05-23: 40 mg via INTRAVENOUS

## 2014-05-23 MED ORDER — ONDANSETRON HCL 4 MG PO TABS
4.0000 mg | ORAL_TABLET | Freq: Four times a day (QID) | ORAL | Status: DC | PRN
Start: 2014-05-23 — End: 2014-05-27

## 2014-05-23 MED ORDER — ASPIRIN EC 325 MG PO TBEC: 325.0000 mg | DELAYED_RELEASE_TABLET | Freq: Every day | ORAL | Status: DC

## 2014-05-23 MED ORDER — ONDANSETRON HCL 4 MG/2ML IJ SOLN
4.0000 mg | Freq: Four times a day (QID) | INTRAMUSCULAR | Status: DC | PRN
  Administered 2014-05-23: 4 mg via INTRAVENOUS
  Filled 2014-05-23: qty 2

## 2014-05-23 MED ORDER — ACETAMINOPHEN 325 MG PO TABS
650.0000 mg | ORAL_TABLET | Freq: Four times a day (QID) | ORAL | Status: DC | PRN
  Administered 2014-05-24 – 2014-05-27 (×9): 650 mg via ORAL
  Filled 2014-05-23 (×9): qty 2

## 2014-05-23 MED ORDER — FENTANYL CITRATE 0.05 MG/ML IJ SOLN
INTRAMUSCULAR | Status: AC
  Filled 2014-05-23: qty 2

## 2014-05-23 MED ORDER — SCOPOLAMINE 1 MG/3DAYS TD PT72SCOPOLAMINE 1 MG/3DAYS
1.0000 | MEDICATED_PATCH | TRANSDERMAL | Status: DC
Start: 2014-05-23 — End: 2014-05-23
  Administered 2014-05-23: 1.5 mg via TRANSDERMAL

## 2014-05-23 MED ORDER — CHLORHEXIDINE GLUCONATE 4 % EX LIQD: 60.0000 mL | Freq: Once | CUTANEOUS | Status: DC

## 2014-05-23 MED ORDER — MIDAZOLAM HCL 5 MG/5ML IJ SOLN
INTRAMUSCULAR | Status: DC | PRN
  Administered 2014-05-23: 2 mg via INTRAVENOUS
  Administered 2014-05-23 (×2): 1 mg via INTRAVENOUS

## 2014-05-23 MED ORDER — ASPIRIN EC 325 MG PO TBEC
325.0000 mg | DELAYED_RELEASE_TABLET | Freq: Every day | ORAL | Status: DC
  Administered 2014-05-23 – 2014-05-27 (×5): 325 mg via ORAL
  Filled 2014-05-23 (×5): qty 1

## 2014-05-23 MED ORDER — ROCURONIUM BROMIDE 50 MG/5ML IV SOLN
INTRAVENOUS | Status: AC
Start: 2014-05-23 — End: 2014-05-23
  Filled 2014-05-23: qty 1

## 2014-05-23 MED ORDER — CEFAZOLIN SODIUM 1-5 GM-% IV SOLN
1.0000 g | Freq: Three times a day (TID) | INTRAVENOUS | Status: AC
  Administered 2014-05-23: 1 g via INTRAVENOUS
  Filled 2014-05-23 (×2): qty 50

## 2014-05-23 MED ORDER — MEPERIDINE HCL 25 MG/ML IJ SOLN: 6.2500 mg | INTRAMUSCULAR | Status: DC | PRN

## 2014-05-23 MED ORDER — FENTANYL CITRATE 0.05 MG/ML IJ SOLN
25.0000 ug | INTRAMUSCULAR | Status: DC | PRN
  Administered 2014-05-23 (×2): 50 ug via INTRAVENOUS

## 2014-05-23 MED ORDER — DOCUSATE SODIUM 100 MG PO CAPS
100.0000 mg | ORAL_CAPSULE | Freq: Two times a day (BID) | ORAL | Status: DC
  Administered 2014-05-23 – 2014-05-26 (×8): 100 mg via ORAL
  Filled 2014-05-23 (×10): qty 1

## 2014-05-23 MED ORDER — 0.9 % SODIUM CHLORIDE (POUR BTL) OPTIME
TOPICAL | Status: DC | PRN
  Administered 2014-05-23: 1000 mL

## 2014-05-23 MED ORDER — NEOSTIGMINE METHYLSULFATE 10 MG/10ML IV SOLN
INTRAVENOUS | Status: AC
  Filled 2014-05-23: qty 1

## 2014-05-23 MED ORDER — METOCLOPRAMIDE HCL 5 MG/ML IJ SOLN: 5.0000 mg | Freq: Three times a day (TID) | INTRAMUSCULAR | Status: DC | PRN

## 2014-05-23 MED ORDER — METHOCARBAMOL 1000 MG/10ML IJ SOLN
500.0000 mg | Freq: Four times a day (QID) | INTRAVENOUS | Status: DC | PRN
  Filled 2014-05-23: qty 5

## 2014-05-23 MED ORDER — ONDANSETRON HCL 4 MG/2ML IJ SOLN
INTRAMUSCULAR | Status: DC | PRN
  Administered 2014-05-23: 4 mg via INTRAVENOUS

## 2014-05-23 SURGICAL SUPPLY — 68 items
BANDAGE ELASTIC 4 VELCRO ST LF (GAUZE/BANDAGES/DRESSINGS) ×2 IMPLANT
BANDAGE ESMARK 6X9 LF (GAUZE/BANDAGES/DRESSINGS) ×1 IMPLANT
BENZOIN TINCTURE PRP APPL 2/3 (GAUZE/BANDAGES/DRESSINGS) IMPLANT
BLADE SAGITTAL 25.0X1.19X90 (BLADE) ×2 IMPLANT
BLADE SAW SGTL 13X75X1.27 (BLADE) ×2 IMPLANT
BNDG ELASTIC 6X10 VLCR STRL LF (GAUZE/BANDAGES/DRESSINGS) ×2 IMPLANT
BNDG ESMARK 6X9 LF (GAUZE/BANDAGES/DRESSINGS) ×2
BOWL SMART MIX CTS (DISPOSABLE) ×2 IMPLANT
CAP KNEE TOTAL 3 SIGMA ×2 IMPLANT
CEMENT HV SMART SET (Cement) ×4 IMPLANT
CLSR STERI-STRIP ANTIMIC 1/2X4 (GAUZE/BANDAGES/DRESSINGS) ×2 IMPLANT
COVER BACK TABLE 24X17X13 BIG (DRAPES) IMPLANT
COVER SURGICAL LIGHT HANDLE (MISCELLANEOUS) ×2 IMPLANT
CUFF TOURNIQUET SINGLE 34IN LL (TOURNIQUET CUFF) ×2 IMPLANT
CUFF TOURNIQUET SINGLE 44IN (TOURNIQUET CUFF) IMPLANT
DRAPE IMP U-DRAPE 54X76 (DRAPES) ×2 IMPLANT
DRAPE ORTHO SPLIT 77X108 STRL (DRAPES) ×2
DRAPE SURG ORHT 6 SPLT 77X108 (DRAPES) ×2 IMPLANT
DRAPE U-SHAPE 47X51 STRL (DRAPES) ×2 IMPLANT
DRSG PAD ABDOMINAL 8X10 ST (GAUZE/BANDAGES/DRESSINGS) ×2 IMPLANT
DURAPREP 26ML APPLICATOR (WOUND CARE) ×2 IMPLANT
ELECT REM PT RETURN 9FT ADLT (ELECTROSURGICAL) ×2
ELECTRODE REM PT RTRN 9FT ADLT (ELECTROSURGICAL) ×1 IMPLANT
FACESHIELD WRAPAROUND (MASK) ×4 IMPLANT
GAUZE SPONGE 4X4 12PLY STRL (GAUZE/BANDAGES/DRESSINGS) ×2 IMPLANT
GAUZE XEROFORM 5X9 LF (GAUZE/BANDAGES/DRESSINGS) ×2 IMPLANT
GLOVE BIOGEL PI IND STRL 8 (GLOVE) ×2 IMPLANT
GLOVE BIOGEL PI INDICATOR 8 (GLOVE) ×2
GLOVE ORTHO TXT STRL SZ7.5 (GLOVE) ×4 IMPLANT
GOWN STRL REUS W/ TWL LRG LVL3 (GOWN DISPOSABLE) ×1 IMPLANT
GOWN STRL REUS W/ TWL XL LVL3 (GOWN DISPOSABLE) ×1 IMPLANT
GOWN STRL REUS W/TWL 2XL LVL3 (GOWN DISPOSABLE) ×2 IMPLANT
GOWN STRL REUS W/TWL LRG LVL3 (GOWN DISPOSABLE) ×1
GOWN STRL REUS W/TWL XL LVL3 (GOWN DISPOSABLE) ×1
HANDPIECE INTERPULSE COAX TIP (DISPOSABLE) ×1
IMMOBILIZER KNEE 22 (SOFTGOODS) ×2 IMPLANT
IMMOBILIZER KNEE 22 UNIV (SOFTGOODS) ×2 IMPLANT
KIT BASIN OR (CUSTOM PROCEDURE TRAY) ×2 IMPLANT
KIT ROOM TURNOVER OR (KITS) ×2 IMPLANT
MANIFOLD NEPTUNE II (INSTRUMENTS) ×2 IMPLANT
MARKER SPHERE PSV REFLC THRD 5 (MARKER) ×6 IMPLANT
NEEDLE 1/2 CIR MAYO (NEEDLE) ×2 IMPLANT
NEEDLE HYPO 25GX1X1/2 BEV (NEEDLE) ×2 IMPLANT
NS IRRIG 1000ML POUR BTL (IV SOLUTION) ×2 IMPLANT
PACK TOTAL JOINT (CUSTOM PROCEDURE TRAY) ×2 IMPLANT
PACK UNIVERSAL I (CUSTOM PROCEDURE TRAY) ×2 IMPLANT
PAD ARMBOARD 7.5X6 YLW CONV (MISCELLANEOUS) ×4 IMPLANT
PAD CAST 4YDX4 CTTN HI CHSV (CAST SUPPLIES) ×1 IMPLANT
PADDING CAST COTTON 4X4 STRL (CAST SUPPLIES) ×1
PADDING CAST COTTON 6X4 STRL (CAST SUPPLIES) ×2 IMPLANT
PIN SCHANZ 4MM 130MM (PIN) ×8 IMPLANT
SET HNDPC FAN SPRY TIP SCT (DISPOSABLE) ×1 IMPLANT
STAPLER SKIN 35 WIDE (STAPLE) ×2 IMPLANT
STAPLER VISISTAT 35W (STAPLE) IMPLANT
STRIP CLOSURE SKIN 1/2X4 (GAUZE/BANDAGES/DRESSINGS) ×4 IMPLANT
SUCTION FRAZIER TIP 10 FR DISP (SUCTIONS) ×2 IMPLANT
SUT ETHIBOND NAB CT1 #1 30IN (SUTURE) ×4 IMPLANT
SUT VIC AB 0 CT1 27 (SUTURE) ×2
SUT VIC AB 0 CT1 27XBRD ANBCTR (SUTURE) ×2 IMPLANT
SUT VIC AB 2-0 CT1 27 (SUTURE) ×2
SUT VIC AB 2-0 CT1 TAPERPNT 27 (SUTURE) ×2 IMPLANT
SUT VICRYL 4-0 PS2 18IN ABS (SUTURE) ×2 IMPLANT
SUT VICRYL AB 2 0 TIES (SUTURE) ×2 IMPLANT
SYR CONTROL 10ML LL (SYRINGE) IMPLANT
TOWEL OR 17X24 6PK STRL BLUE (TOWEL DISPOSABLE) ×2 IMPLANT
TOWEL OR 17X26 10 PK STRL BLUE (TOWEL DISPOSABLE) ×2 IMPLANT
TRAY FOLEY CATH 16FRSI W/METER (SET/KITS/TRAYS/PACK) IMPLANT
WATER STERILE IRR 1000ML POUR (IV SOLUTION) IMPLANT

## 2014-05-23 NOTE — Anesthesia Procedure Notes (Addendum)
Anesthesia Regional Block:  Adductor canal block  Pre-Anesthetic Checklist: ,, timeout performed, Correct Patient, Correct Site, Correct Laterality, Correct Procedure, Correct Position, site marked, Risks and benefits discussed,  Surgical consent,  Pre-op evaluation,  At surgeon's request and post-op pain management  Laterality: Right and Lower  Prep: Maximum Sterile Barrier Precautions used and chloraprep       Needles:  Injection technique: Single-shot  Needle Type: Echogenic Stimulator Needle     Needle Length: 10cm 10 cm Needle Gauge: 21 and 21 G    Additional Needles:  Procedures: ultrasound guided (picture in chart) Adductor canal block Narrative:  Start time: 05/23/2014 7:08 AM End time: 05/23/2014 7:14 AM  Performed by: Personally  Anesthesiologist: Sebastian Ache  Additional Notes: R AC Block, US guided, talked to patient throughout, multiple asp, 60ml .5% marcaine with epi, no complications   Procedure Name: Intubation Date/Time: 05/23/2014 7:42 AM Performed by: Armandina Gemma Pre-anesthesia Checklist: Patient identified, Timeout performed, Emergency Drugs available, Suction available and Patient being monitored Patient Re-evaluated:Patient Re-evaluated prior to inductionOxygen Delivery Method: Circle system utilized Preoxygenation: Pre-oxygenation with 100% oxygen Intubation Type: IV induction Ventilation: Mask ventilation without difficulty Laryngoscope Size: Miller and 2 Grade View: Grade I Tube type: Oral Tube size: 7.0 mm Number of attempts: 1 Airway Equipment and Method: Stylet Placement Confirmation: ETT inserted through vocal cords under direct vision,  positive ETCO2 and breath sounds checked- equal and bilateral Secured at: 22 cm Tube secured with: Tape Dental Injury: Teeth and Oropharynx as per pre-operative assessment  Comments: IV induction Manny- intubation AM CRNA- atraumatic- implants on bottom as preop

## 2014-05-23 NOTE — Anesthesia Postprocedure Evaluation (Signed)
  Anesthesia Post-op Note  Patient: Chelsea Callahan  Procedure(s) Performed: Procedure(s): COMPUTER ASSISTED TOTAL KNEE ARTHROPLASTY (Right)  Patient Location: PACU  Anesthesia Type:General  Level of Consciousness: awake and alert   Airway and Oxygen Therapy: Patient Spontanous Breathing and Patient connected to nasal cannula oxygen  Post-op Pain: moderate  Post-op Assessment: Post-op Vital signs reviewed, Patient's Cardiovascular Status Stable, Respiratory Function Stable, Patent Airway and No signs of Nausea or vomiting  Post-op Vital Signs: Reviewed and stable  Last Vitals:  Filed Vitals:   05/23/14 1006  BP: 163/92  Pulse: 94  Temp: 36.7 C  Resp: 15    Complications: No apparent anesthesia complications

## 2014-05-23 NOTE — Transfer of Care (Signed)
Immediate Anesthesia Transfer of Care Note  Patient: Chelsea Callahan  Procedure(s) Performed: Procedure(s): COMPUTER ASSISTED TOTAL KNEE ARTHROPLASTY (Right)  Patient Location: PACU  Anesthesia Type:General  Level of Consciousness: awake and alert   Airway & Oxygen Therapy: Patient Spontanous Breathing and Patient connected to nasal cannula oxygen  Post-op Assessment: Report given to PACU RN and Post -op Vital signs reviewed and stable  Post vital signs: Reviewed and stable  Complications: No apparent anesthesia complications

## 2014-05-23 NOTE — Interval H&P Note (Signed)
History and Physical Interval Note:  05/23/2014 7:27 AM  Chelsea Callahan  has presented today for surgery, with the diagnosis of Osteoarthritis Right Knee  The various methods of treatment have been discussed with the patient and family. After consideration of risks, benefits and other options for treatment, the patient has consented to  Procedure(s): COMPUTER ASSISTED TOTAL KNEE ARTHROPLASTY (Right) as a surgical intervention .  The patient's history has been reviewed, patient examined, no change in status, stable for surgery.  I have reviewed the patient's chart and labs.  Questions were answered to the patient's satisfaction.     Arlon Bleier C

## 2014-05-23 NOTE — Progress Notes (Signed)
Orthopedic Tech Progress Note Patient Details:  Chelsea Callahan [birth year] [id number]  CPM Right Knee CPM Right Knee: On Right Knee Flexion (Degrees): 60 Right Knee Extension (Degrees): 0   Chelsea Callahan 05/23/2014, 12:46 PM

## 2014-05-23 NOTE — Op Note (Signed)
Chelsea Callahan, Chelsea Callahan                 ACCOUNT NO.:  [id number]  MEDICAL RECORD NO.:  [id number]  LOCATION:  [facility]                         FACILITY:  [facility]  PHYSICIAN:  Mark C. Ophelia Charter, M.D.    DATE OF BIRTH:  08-May-1950  DATE OF PROCEDURE:  05/23/2014 DATE OF DISCHARGE:                              OPERATIVE REPORT   PREOPERATIVE DIAGNOSIS:  Right knee osteoarthritis.  POSTOPERATIVE DIAGNOSIS:  Right knee osteoarthritis.  PROCEDURE:  Right total knee arthroplasty, cemented, computer assist.  SURGEON:  Mark C. Ophelia Charter, M.D.  ASSISTANTS:  Genene Churn. Barry Dienes, PA-C medically necessary and present for the entire procedure.  ANESTHESIA:  General plus preoperative obturator block.  COMPLICATIONS:  None.  COMPONENTS:  DePuy LCS rotating platform 2.5 femur, 2.5 tibia, 10-mm spacer, and 35 mm 3-peg patella.  OPERATIVE PROCEDURE:  After induction of general anesthesia, standard, was prepped and draped.  Proximal thigh tourniquet was used lateral post and heel bump.  Prepping and draping was performed.  Usual impervious stockinette, Coban, sterile skin marker and Betadine, Steri-Drape was used for sealing the skin.  Time-out procedure was completed.  Sterile skin marker had been used.  The patient had an increased BMI with flexion of knee to  heel bump.  The straight line noted with extension changed in position, so a line was split between the 2 trying to make it as straight as possible for both extension and flexion.  After a time- out, leg was elevated, wrapped with an Esmarch and tourniquet was inflated.  Tourniquet was at 350.  A midline incision was made.  Patella was everted, cut 10 mm __________ menisci, ACL, PCL were resected.  Pins were placed in the femur inside the incision and stab incision to mid tibia with a 15 blade, and the pins were placed.  Computer modules were made using both tibia first and femur.  A 2.5 identical to the opposite total knee arthroplasty.  Distal femur was  cut first.  Chamfer cuts were made.  Posterior bone spurs removed particularly on the lateral side. Box cut was made later __________.  The tibia was cut taking 9 mm on the tibial side identical to 9 on the femur.  Keel preparation trials were inserted.  Medial side showed 3 mm tighter within lateral.  There was full extension.  The patient's preoperative varus and flexion contracture of 2 degrees was corrected.  Varus was 4.5.  Postop was at 0 degrees.  Medial collateral slightly stripped.  Deep fibers 3/4 curved osteotome.  This corrected the medial and lateral balance to within 1 mm.  Collateral ligaments were symmetrical to both flexion and extension.  The lug nuts were drilled on the femur.  The 3 pegs drilled for the patella.  Pulsatile lavage used to vacuum mixing of the cement and cementing of the tibial component followed by femur insertion the poly and then the patellar component last.  All excess cement was removed.  The gutters were checked.  Posterior area was freed.  Once the cement was hardened at 15 minutes.  Tourniquet was deflated.  Hemostasis obtained, and then standard closure with #1 Ethibond in the deep retinaculum 2-0 Vicryl subcutaneous tissue, and  superficial retinaculum.  4-0 Vicryl subcuticular closure.  Tincture of benzoin, Steri-Strips, postop dressing after closure of the stab incisions, tibia with 4-0 Vicryl.  Instrument and needle count was correct.     Mark C. Ophelia Charter, M.D.     MC[facility]/MEDQ  D:  05/23/2014  T:  05/23/2014  Job:  [id number]

## 2014-05-23 NOTE — Progress Notes (Signed)
Physical Therapy Evaluation Patient Details Name: Chelsea Callahan MRN: [id number] DOB: 1951/03/03 Today's Date: 05/23/2014   History of Present Illness  Patient is a 64 yo female admitted 05/23/14 now s/p Rt TKA.  PMH:  RA, anemia, headaches  Clinical Impression  Patient presents with problems listed below.  Will benefit from acute PT to maximize independence prior to discharge home with husband.      Follow Up Recommendations Home health PT;Supervision/Assistance - 24 hour    Equipment Recommendations  None recommended by PT    Recommendations for Other Services       Precautions / Restrictions Precautions Precautions: Knee Precaution Booklet Issued: Yes (comment) Precaution Comments: Reviewed precautions with patient and her husband. Required Braces or Orthoses: Knee Immobilizer - Right Knee Immobilizer - Right: On when out of bed or walking Restrictions Weight Bearing Restrictions: Yes RLE Weight Bearing: Weight bearing as tolerated      Mobility  Bed Mobility               General bed mobility comments: Patient in chair as PT entered room  Transfers Overall transfer level: Needs assistance Equipment used: Rolling walker (2 wheeled) Transfers: Sit to/from Stand Sit to Stand: Min assist         General transfer comment: Verbal cues for hand placement and technique.  Assist to rise to standing and for balance initially.  Assist to control descent into chair  Ambulation/Gait Ambulation/Gait assistance: Min assist Ambulation Distance (Feet): 24 Feet Assistive device: Rolling walker (2 wheeled) Gait Pattern/deviations: Step-through pattern;Decreased stance time - right;Decreased step length - left;Decreased stride length;Decreased weight shift to right;Antalgic Gait velocity: Decreased Gait velocity interpretation: Below normal speed for age/gender General Gait Details: Verbal cues for safe use of RW and for gait sequence.  Initially required assist to advance RLE.   Was able to advance RLE without assist after 3-4 steps.  Stairs            Wheelchair Mobility    Modified Rankin (Stroke Patients Only)       Balance                                             Pertinent Vitals/Pain Pain Assessment: 0-10 Pain Score: 5  Pain Location: Rt knee Pain Descriptors / Indicators: Aching Pain Intervention(s): Limited activity within patient's tolerance;Repositioned    Home Living Family/patient expects to be discharged to:: Private residence Living Arrangements: Spouse/significant other Available Help at Discharge: Family;Available 24 hours/day Type of Home: House Home Access: Stairs to enter Entrance Stairs-Rails: Right Entrance Stairs-Number of Steps: 4 Home Layout: Two level;1/2 bath on main level;Bed/bath upstairs Home Equipment: Walker - 2 wheels;Cane - single point;Tub bench;Bedside commode      Prior Function Level of Independence: Independent         Comments: Used a cane occasionally for long distances or with increased pain     Hand Dominance   Dominant Hand: Right    Extremity/Trunk Assessment   Upper Extremity Assessment: Overall WFL for tasks assessed           Lower Extremity Assessment: RLE deficits/detail RLE Deficits / Details: Decreased strength and ROM post-op    Cervical / Trunk Assessment: Normal  Communication   Communication: No difficulties  Cognition Arousal/Alertness: Lethargic;Suspect due to medications Behavior During Therapy: [facility] for tasks assessed/performed Overall Cognitive Status: Within Functional Limits for  tasks assessed                      General Comments      Exercises Total Joint Exercises Ankle Circles/Pumps: AROM;Both;10 reps;Seated      Assessment/Plan    PT Assessment Patient needs continued PT services  PT Diagnosis Difficulty walking;Acute pain   PT Problem List Decreased strength;Decreased range of motion;Decreased activity  tolerance;Decreased balance;Decreased mobility;Decreased knowledge of use of DME;Decreased knowledge of precautions;Pain  PT Treatment Interventions DME instruction;Gait training;Stair training;Functional mobility training;Therapeutic activities;Therapeutic exercise;Patient/family education   PT Goals (Current goals can be found in the Care Plan section) Acute Rehab PT Goals Patient Stated Goal: To return home PT Goal Formulation: With patient/family Time For Goal Achievement: 05/30/14 Potential to Achieve Goals: Good    Frequency 7X/week   Barriers to discharge Inaccessible home environment Flight of stairs to get to bedroom    Co-evaluation               End of Session Equipment Utilized During Treatment: Gait belt;Right knee immobilizer Activity Tolerance: Patient tolerated treatment well Patient left: in chair;with call bell/phone within reach;with family/visitor present Nurse Communication: Mobility status         Time: 9163-8466 PT Time Calculation (min) (ACUTE ONLY): 22 min   Charges:   PT Evaluation $Initial PT Evaluation Tier I: 1 Procedure PT Treatments $Gait Training: 8-22 mins   PT G CodesVena Austria 2014/06/07, 3:32 PM Durenda Hurt. Renaldo Fiddler, [facility] Acute Rehab Services Pager [phone]

## 2014-05-24 LAB — BASIC METABOLIC PANEL
Anion gap: 11 (ref 5–15)
BUN: 9 mg/dL (ref 6–23)
CHLORIDE: 102 mmol/L (ref 96–112)
CO2: 23 mmol/L (ref 19–32)
Calcium: 9.5 mg/dL (ref 8.4–10.5)
Creatinine, Ser: 0.88 mg/dL (ref 0.50–1.10)
GFR calc non Af Amer: 69 mL/min — ABNORMAL LOW (ref >90)
GFR, EST AFRICAN AMERICAN: 80 mL/min — AB (ref >90)
GLUCOSE: 114 mg/dL — AB (ref 70–99)
POTASSIUM: 4.2 mmol/L (ref 3.5–5.1)
Sodium: 136 mmol/L (ref 135–145)

## 2014-05-24 LAB — CBC
HCT: 27.6 % — ABNORMAL LOW (ref 36.0–46.0)
Hemoglobin: 8.6 g/dL — ABNORMAL LOW (ref 12.0–15.0)
MCH: 26.4 pg (ref 26.0–34.0)
MCHC: 31.2 g/dL (ref 30.0–36.0)
MCV: 84.7 fL (ref 78.0–100.0)
PLATELETS: 265 10*3/uL (ref 150–400)
RBC: 3.26 MIL/uL — AB (ref 3.87–5.11)
RDW: 14.6 % (ref 11.5–15.5)
WBC: 7.1 10*3/uL (ref 4.0–10.5)

## 2014-05-24 NOTE — Progress Notes (Signed)
Physical Therapy Treatment Patient Details Name: Chelsea Callahan MRN: [id number] DOB: Apr 21, 1951 Today's Date: 05/24/2014    History of Present Illness Patient is a 64 yo female admitted 05/23/14 now s/p Rt TKA.  PMH:  RA, anemia, headaches    PT Comments    Patient's pain limiting mobility progress.  Patient able to tolerate only 5 repetitions of partial ROM exercises with active assistance.  Patient has supportive family upon discharge.  Patient may benefit from skilled therapy while on this level of care to assist with mobility progression, and to prepare for discharge home with husband.  Follow Up Recommendations  Home health PT;Supervision/Assistance - 24 hour     Equipment Recommendations  None recommended by PT    Recommendations for Other Services       Precautions / Restrictions Precautions Precautions: Knee Precaution Comments: Reviewed precautions with patient and her husband. Required Braces or Orthoses: Knee Immobilizer - Right Knee Immobilizer - Right: On when out of bed or walking (patient prefers to wear while in chair for comfort) Restrictions Weight Bearing Restrictions: Yes RLE Weight Bearing: Weight bearing as tolerated    Mobility  Bed Mobility Overal bed mobility:  (pt OOB upon arrival, requested to return to chair)                Transfers Overall transfer level: Needs assistance Equipment used: Rolling walker (2 wheeled) Transfers: Sit to/from Stand Sit to Stand: Min assist (assist to manage Rt leg during sit to/from stand)         General transfer comment: Patient needs assistance to manage Rt leg during transfers to manage pain  Ambulation/Gait Ambulation/Gait assistance: Min guard Ambulation Distance (Feet): 50 Feet Assistive device: Rolling walker (2 wheeled) Gait Pattern/deviations: Step-to pattern;Antalgic (uses Rt hand to advance Rt leg during swing phase initially) Gait velocity: Decreased       Stairs             Wheelchair Mobility    Modified Rankin (Stroke Patients Only)       Balance Overall balance assessment: Modified Independent                                  Cognition Arousal/Alertness: Awake/alert Behavior During Therapy: WFL for tasks assessed/performed Overall Cognitive Status: Within Functional Limits for tasks assessed                      Exercises Total Joint Exercises Ankle Circles/Pumps: AROM;Both;10 reps;Seated Quad Sets: AROM;Right;5 reps Short Arc QuadBarbaraann Boys;Right;5 reps Heel Slides: AAROM;Right;5 reps Hip ABduction/ADduction: AAROM;Right;5 reps Straight Leg Raises: AAROM;Right;5 reps    General Comments General comments (skin integrity, edema, etc.): leg wrapped in compression wrap, min swelling lower leg      Pertinent Vitals/Pain Pain Assessment: 0-10 Pain Score: 10-Worst pain ever Pain Location: Rt knee Pain Descriptors / Indicators: Aching;Constant;Operative site guarding Pain Intervention(s): Limited activity within patient's tolerance;Monitored during session;Premedicated before session;Ice applied    Home Living                      Prior Function            PT Goals (current goals can now be found in the care plan section) Acute Rehab PT Goals Patient Stated Goal: To return home PT Goal Formulation: With patient/family Time For Goal Achievement: 05/30/14 Potential to Achieve Goals: Good Progress towards PT goals: Progressing toward goals  Frequency  7X/week    PT Plan Current plan remains appropriate    Co-evaluation             End of Session Equipment Utilized During Treatment: Gait belt;Right knee immobilizer Activity Tolerance: Patient tolerated treatment well;Patient limited by pain Patient left: in chair;with call bell/phone within reach;with family/visitor present     Time: 5498-2641 PT Time Calculation (min) (ACUTE ONLY): 41 min  Charges:  $Gait Training: 8-22  mins $Therapeutic Exercise: 8-22 mins                    G CodesNestor Lewandowsky, [place] [phone] Lucero Ide 05/24/2014, 10:49 AM

## 2014-05-24 NOTE — Evaluation (Signed)
Occupational Therapy Evaluation Patient Details Name: Chelsea Callahan MRN: [id number] DOB: Jan 31, 1951 Today's Date: 05/24/2014    History of Present Illness Patient is a 64 yo female admitted 05/23/14 now s/p Rt TKA.  PMH:  RA, anemia, headaches   Clinical Impression   PTA pt lived at home and was independent with ADLs with occasional use of SPC. Pt is currently limited by pain with mobility and c/o nausea when standing for more than a few minutes. Pt requires (A) for LB ADLs due to pain and decreased ROM. Pt will benefit from acute OT to address ADLs and functional mobility.     Follow Up Recommendations  No OT follow up;Supervision/Assistance - 24 hour    Equipment Recommendations  None recommended by OT    Recommendations for Other Services       Precautions / Restrictions Precautions Precautions: Knee Precaution Comments: Reviewed precautions with patient and her husband. Required Braces or Orthoses: Knee Immobilizer - Right Knee Immobilizer - Right: On when out of bed or walking Restrictions Weight Bearing Restrictions: Yes RLE Weight Bearing: Weight bearing as tolerated      Mobility Bed Mobility Overal bed mobility:  (pt OOB upon arrival, requested to return to chair)             General bed mobility comments: Pt sitting in recliner when OT arrived. Educated pt on use of gait belt for bed mobility.   Transfers Overall transfer level: Needs assistance Equipment used: Rolling walker (2 wheeled) Transfers: Sit to/from Stand Sit to Stand: Min assist ((A) to manage RLE during transition)         General transfer comment: Patient needs assistance to manage Rt leg during transfers to manage pain    Balance Overall balance assessment: Modified Independent                                          ADL Overall ADL's : Needs assistance/impaired Eating/Feeding: Independent;Sitting   Grooming: Supervision/safety;Standing;Oral care;Wash/dry hands    Upper Body Bathing: Set up;Sitting   Lower Body Bathing: Moderate assistance;Sit to/from stand   Upper Body Dressing : Set up;Sitting   Lower Body Dressing: Maximal assistance;Sit to/from stand   Toilet Transfer: Minimal assistance;Ambulation;RW;BSC (over toilet) Toilet Transfer Details (indicate cue type and reason): min (A) for sit<>stand Toileting- Clothing Manipulation and Hygiene: Min guard;Sit to/from stand       Functional mobility during ADLs: Min guard;Rolling walker General ADL Comments: Pt limited by pain and c/o nausea after standing for a while. Pt ambulated to bathroom for toileting and performed grooming at sink in standing.      Vision  Pt reports no change from baseline.                    Perception Perception Perception Tested?: No   Praxis Praxis Praxis tested?: Within functional limits    Pertinent Vitals/Pain Pain Assessment: 0-10 Pain Score: 9  Pain Location: Rt knee Pain Descriptors / Indicators: Aching;Constant Pain Intervention(s): Limited activity within patient's tolerance;Monitored during session;Repositioned     Hand Dominance Right   Extremity/Trunk Assessment Upper Extremity Assessment Upper Extremity Assessment: Overall WFL for tasks assessed   Lower Extremity Assessment Lower Extremity Assessment: Defer to PT evaluation   Cervical / Trunk Assessment Cervical / Trunk Assessment: Normal   Communication Communication Communication: No difficulties   Cognition Arousal/Alertness: Awake/alert Behavior During Therapy: [facility] for  tasks assessed/performed Overall Cognitive Status: Within Functional Limits for tasks assessed                                Home Living Family/patient expects to be discharged to:: Private residence Living Arrangements: Spouse/significant other Available Help at Discharge: Family;Available 24 hours/day Type of Home: House Home Access: Stairs to enter [employer] of Steps:  4 Entrance Stairs-Rails: Right Home Layout: Two level;1/2 bath on main level;Bed/bath upstairs Alternate Level Stairs-Number of Steps: 15 Alternate Level Stairs-Rails: Right Bathroom Shower/Tub: Tub/shower unit Shower/tub characteristics: Curtain       Home Equipment: Environmental consultant - 2 wheels;Cane - single point;Tub bench;Bedside commode          Prior Functioning/Environment Level of Independence: Independent with assistive device(s)        Comments: Used a cane occasionally for long distances or with increased pain    OT Diagnosis: Generalized weakness;Acute pain   OT Problem List: Decreased strength;Decreased range of motion;Decreased activity tolerance;Impaired balance (sitting and/or standing);Decreased safety awareness;Decreased knowledge of use of DME or AE;Decreased knowledge of precautions;Pain   OT Treatment/Interventions: Self-care/ADL training;Therapeutic exercise;Energy conservation;DME and/or AE instruction;Therapeutic activities;Balance training;Patient/family education    OT Goals(Current goals can be found in the care plan section) Acute Rehab OT Goals Patient Stated Goal: to keep moving so I can progress OT Goal Formulation: With patient Time For Goal Achievement: 06/07/14 Potential to Achieve Goals: Good ADL Goals Pt Will Perform Grooming: with modified independence;standing Pt Will Perform Lower Body Bathing: with set-up;with supervision;sit to/from stand Pt Will Perform Lower Body Dressing: with set-up;with supervision;sit to/from stand;with adaptive equipment Pt Will Transfer to Toilet: ambulating;with supervision Pt Will Perform Toileting - Clothing Manipulation and hygiene: with supervision;sit to/from stand  OT Frequency: Min 2X/week    End of Session Equipment Utilized During Treatment: Gait belt;Rolling walker;Right knee immobilizer CPM Right Knee CPM Right Knee: Off  Activity Tolerance: Patient limited by pain Patient left: in chair;with call  bell/phone within reach;with family/visitor present   Time: 0277-4128 OT Time Calculation (min): 24 min Charges:  OT General Charges $OT Visit: 1 Procedure OT Evaluation $Initial OT Evaluation Tier I: 1 Procedure OT Treatments $Self Care/Home Management : 8-22 mins G-Codes:    Rae Lips 06/18/2014, 12:07 PM  Carney Living, OTR/L Occupational Therapist [phone] (pager)

## 2014-05-24 NOTE — Progress Notes (Signed)
NCM spoke to pt and states she had [facility] last surgery. Pt requesting [facility] for [facility]. States she has RW and 3n1 at home. Isidoro Donning RN CCM Case Mgmt phone [phone]

## 2014-05-24 NOTE — Progress Notes (Signed)
Subjective: 1 Day Post-Op Procedure(s) (LRB): COMPUTER ASSISTED TOTAL KNEE ARTHROPLASTY (Right) Patient reports pain as moderate.  Acute blood loss anemia from surgery, but tolerating well.  Dong well overall.  Objective: Vital signs in last 24 hours: Temp:  [97.7 F (36.5 C)-98.8 F (37.1 C)] 98.2 F (36.8 C) (01/23 0518) Pulse Rate:  [69-94] 85 (01/23 0518) Resp:  [11-18] 16 (01/23 0518) BP: (118-177)/(49-104) 126/49 mmHg (01/23 0518) SpO2:  [94 %-100 %] 100 % (01/23 0518)  Intake/Output from previous day: 01/22 0701 - 01/23 0700 In: 3185 [P.O.:1200; I.V.:1985] Out: 50 [Blood:50] Intake/Output this shift: Total I/O In: 240 [P.O.:240] Out: -    Recent Labs  05/24/14 0405  HGB 8.6*    Recent Labs  05/24/14 0405  WBC 7.1  RBC 3.26*  HCT 27.6*  PLT 265    Recent Labs  05/24/14 0405  NA 136  K 4.2  CL 102  CO2 23  BUN 9  CREATININE 0.88  GLUCOSE 114*  CALCIUM 9.5   No results for input(s): LABPT, INR in the last 72 hours.  Sensation intact distally Intact pulses distally Dorsiflexion/Plantar flexion intact Incision: dressing C/D/I Compartment soft  Assessment/Plan: 1 Day Post-Op Procedure(s) (LRB): COMPUTER ASSISTED TOTAL KNEE ARTHROPLASTY (Right) Up with therapy  Follow H/H  Daxtin Leiker Y 05/24/2014, 9:55 AM

## 2014-05-25 LAB — CBC
HCT: 24.7 % — ABNORMAL LOW (ref 36.0–46.0)
HEMOGLOBIN: 7.8 g/dL — AB (ref 12.0–15.0)
MCH: 26.8 pg (ref 26.0–34.0)
MCHC: 31.6 g/dL (ref 30.0–36.0)
MCV: 84.9 fL (ref 78.0–100.0)
Platelets: 221 10*3/uL (ref 150–400)
RBC: 2.91 MIL/uL — ABNORMAL LOW (ref 3.87–5.11)
RDW: 14.7 % (ref 11.5–15.5)
WBC: 7.7 10*3/uL (ref 4.0–10.5)

## 2014-05-25 NOTE — Progress Notes (Signed)
Physical Therapy Treatment Patient Details Name: Chelsea Callahan MRN: [id number] DOB: 21-Sep-1950 Today's Date: 05/25/2014    History of Present Illness Patient is a 64 yo female admitted 05/23/14 now s/p Rt TKA.  PMH:  RA, anemia, headaches    PT Comments    Continuing progress with ambulation, initiated stair training; Very painful today, with noted difficulty with RLE advancement (pt attrributes this to pre-morbid hip weakness;  Plan for further stair training next session   Follow Up Recommendations  Home health PT;Supervision/Assistance - 24 hour     Equipment Recommendations  None recommended by PT    Recommendations for Other Services       Precautions / Restrictions Precautions Precautions: Knee Precaution Comments: Reviewed precautions with patient and her husband. Required Braces or Orthoses: Knee Immobilizer - Right Knee Immobilizer - Right: On when out of bed or walking (patient prefers to wear while in chair for comfort) Restrictions Weight Bearing Restrictions: Yes RLE Weight Bearing: Weight bearing as tolerated    Mobility  Bed Mobility Overal bed mobility: Needs Assistance (pt OOB upon arrival, requested to return to chair) Bed Mobility: Supine to Sit     Supine to sit: Min assist     General bed mobility comments: Min assist to support RLE as it came off the bed  Transfers Overall transfer level: Needs assistance Equipment used: Rolling walker (2 wheeled) Transfers: Sit to/from Stand Sit to Stand: Min guard (assist to manage Rt leg during sit to/from stand)         General transfer comment: Patient needs assistance to manage Rt leg during transfers to manage pain; gave instructional cues to preposition LRE for comfort during transitions  Ambulation/Gait Ambulation/Gait assistance: Min guard (with and without physical contact) Ambulation Distance (Feet): 120 Feet Assistive device: Rolling walker (2 wheeled) Gait Pattern/deviations: Step-to  pattern;Decreased step length - left Gait velocity: Decreased   General Gait Details: Pt initially used her R hand on KI to advance RLE; was able to step without UE assist after about 20 feet; very slow, painful steps   Stairs Stairs: Yes Stairs assistance: Min guard Stair Management: One rail Right;Step to pattern;Sideways Number of Stairs: 3 General stair comments: verbal and demo cues for technique  Wheelchair Mobility    Modified Rankin (Stroke Patients Only)       Balance                                    Cognition Arousal/Alertness: Awake/alert Behavior During Therapy: WFL for tasks assessed/performed Overall Cognitive Status: Within Functional Limits for tasks assessed                      Exercises Total Joint Exercises Quad Sets: AROM;Right;5 reps Short Arc QuadBarbaraann Boys;Right;5 reps Heel Slides: AAROM;Right;5 reps    General Comments        Pertinent Vitals/Pain Pain Assessment: 0-10 Pain Score: 9  Pain Location: R knee after walking Pain Descriptors / Indicators: Aching Pain Intervention(s): Limited activity within patient's tolerance;Monitored during session;Patient requesting pain meds-RN notified    Home Living                      Prior Function            PT Goals (current goals can now be found in the care plan section) Acute Rehab PT Goals Patient Stated Goal: To return home PT  Goal Formulation: With patient/family Time For Goal Achievement: 05/30/14 Potential to Achieve Goals: Good    Frequency  7X/week    PT Plan Current plan remains appropriate    Co-evaluation             End of Session Equipment Utilized During Treatment: Gait belt;Right knee immobilizer Activity Tolerance: Patient tolerated treatment well;Patient limited by pain Patient left: in chair;with call bell/phone within reach;with family/visitor present     Time: 4268-3419 PT Time Calculation (min) (ACUTE ONLY): 48  min  Charges:  $Gait Training: 23-37 mins $Therapeutic Exercise: 8-22 mins                    G Codes:      [name] 05/25/2014, 4:36 PM  [name], [place]  Acute Rehabilitation Services Pager [phone] Office [phone]

## 2014-05-25 NOTE — Progress Notes (Signed)
Subjective: 2 Days Post-Op Procedure(s) (LRB): COMPUTER ASSISTED TOTAL KNEE ARTHROPLASTY (Right) Patient reports pain as moderate.  Working well with therapy.  Hgb dropped to less than 8, but vitals stable.  Not light-headed.  Objective: Vital signs in last 24 hours: Temp:  [99 F (37.2 C)-99.3 F (37.4 C)] 99 F (37.2 C) (01/24 0555) Pulse Rate:  [84-89] 89 (01/24 0555) Resp:  [16-20] 16 (01/24 0555) BP: (121-135)/(38-58) 121/38 mmHg (01/24 0555) SpO2:  [99 %-100 %] 99 % (01/24 0555)  Intake/Output from previous day: 01/23 0701 - 01/24 0700 In: 840 [P.O.:840] Out: -  Intake/Output this shift: Total I/O In: 240 [P.O.:240] Out: -    Recent Labs  05/24/14 0405 05/25/14 0521  HGB 8.6* 7.8*    Recent Labs  05/24/14 0405 05/25/14 0521  WBC 7.1 7.7  RBC 3.26* 2.91*  HCT 27.6* 24.7*  PLT 265 221    Recent Labs  05/24/14 0405  NA 136  K 4.2  CL 102  CO2 23  BUN 9  CREATININE 0.88  GLUCOSE 114*  CALCIUM 9.5   No results for input(s): LABPT, INR in the last 72 hours.  Sensation intact distally Intact pulses distally Dorsiflexion/Plantar flexion intact Incision: scant drainage No cellulitis present Compartment soft  Assessment/Plan: 2 Days Post-Op Procedure(s) (LRB): COMPUTER ASSISTED TOTAL KNEE ARTHROPLASTY (Right) Up with therapy  Likely discharge to home tomorrow. Will needed to follow H/H again in the am.  Kathryne Hitch 05/25/2014, 9:35 AM

## 2014-05-25 NOTE — Progress Notes (Signed)
Utilization Review Completed.   Ladarien Beeks, RN, BSN Nurse Case Manager  

## 2014-05-26 ENCOUNTER — Encounter (HOSPITAL_COMMUNITY): Payer: Self-pay | Admitting: Orthopaedic Surgery

## 2014-05-26 DIAGNOSIS — M179 Osteoarthritis of knee, unspecified: Secondary | ICD-10-CM

## 2014-05-26 DIAGNOSIS — Z96659 Presence of unspecified artificial knee joint: Secondary | ICD-10-CM

## 2014-05-26 LAB — CBC
HEMATOCRIT: 22.2 % — AB (ref 36.0–46.0)
HEMOGLOBIN: 7 g/dL — AB (ref 12.0–15.0)
MCH: 26.8 pg (ref 26.0–34.0)
MCHC: 31.5 g/dL (ref 30.0–36.0)
MCV: 85.1 fL (ref 78.0–100.0)
Platelets: 212 10*3/uL (ref 150–400)
RBC: 2.61 MIL/uL — AB (ref 3.87–5.11)
RDW: 14.7 % (ref 11.5–15.5)
WBC: 5.8 10*3/uL (ref 4.0–10.5)

## 2014-05-26 LAB — PREPARE RBC (CROSSMATCH)

## 2014-05-26 MED ORDER — SODIUM CHLORIDE 0.9 % IV SOLN: Freq: Once | INTRAVENOUS | Status: DC

## 2014-05-26 MED ORDER — BISACODYL 10 MG RE SUPP: 10.0000 mg | Freq: Once | RECTAL | Status: DC

## 2014-05-26 MED ORDER — FUROSEMIDE 10 MG/ML IJ SOLN
20.0000 mg | Freq: Once | INTRAMUSCULAR | Status: AC
  Administered 2014-05-26: 20 mg via INTRAVENOUS
  Filled 2014-05-26: qty 2

## 2014-05-26 NOTE — Progress Notes (Signed)
Occupational Therapy Treatment Patient Details Name: Chelsea Callahan MRN: [id number] DOB: 02-Jan-1951 Today's Date: 05/26/2014    History of present illness Patient is a 64 yo female admitted 05/23/14 now s/p Rt TKA.  PMH:  RA, anemia, headaches   OT comments  Pt. Is performing toileting at S level of assist.  Declines tub transfer review stating she has a tub bench at home from previous sx.  Husband present and reports he will assist with LB ADLS as needed.  Pt. And spouse have no further questions.  Pt. Clear from OT standpoint for d/c home.  Will notify OTR/L for sign off.    Follow Up Recommendations  No OT follow up;Supervision/Assistance - 24 hour    Equipment Recommendations  None recommended by OT    Recommendations for Other Services      Precautions / Restrictions Precautions Precautions: Knee Precaution Comments: Reviewed precautions with patient and her husband. Required Braces or Orthoses: Knee Immobilizer - Right Knee Immobilizer - Right: On when out of bed or walking (Nice, stable knee in stance, so we opted to amb without KI) Restrictions RLE Weight Bearing: Weight bearing as tolerated       Mobility Bed Mobility               General bed mobility comments: up in room during session in recliner  Transfers Overall transfer level: Needs assistance Equipment used: Rolling walker (2 wheeled) Transfers: Sit to/from [facility] Sit to Stand: Supervision Stand pivot transfers: Supervision       General transfer comment: Patient needs assistance to manage Rt leg during transfers to manage pain; gave instructional cues to preposition RLE for comfort during transitions; husband present and able to give correct assist                                       ADL Overall ADL's : Needs assistance/impaired     Grooming: Wash/dry hands;Standing;Supervision/safety                 Lower Body Dressing Details (indicate cue type and  reason): spouse available at home and reports he will assist with LB adls as needed Toilet Transfer: Supervision/safety;Ambulation;RW;BSC;Regular Glass blower/designer Details (indicate cue type and reason): bsc over the toilet Toileting- Clothing Manipulation and Hygiene: Supervision/safety;Sit to/from stand;Sitting/lateral lean   Tub/ Shower Transfer: Tub transfer;Tub bench;Grab bars Tub/Shower Transfer Details (indicate cue type and reason): pt. reports she has a tub bench and grab bars from a previous knee surgery and declines need for review of this transfer   General ADL Comments: pt. initially resistant to instruction but eased into the therapeutic process.  explained that she had just recently been through knee surgery so she felt confident with all transfers.  s for all toileting, and husband to assist with LB adls                                      Cognition   Behavior During Therapy: [facility] for tasks assessed/performed Overall Cognitive Status: Within Functional Limits for tasks assessed  Pertinent Vitals/ Pain       Pain Assessment: No/denies pain Pain Score: 9  (5/10 initially) Pain Location: R knee after amb and steps Pain Descriptors / Indicators: Aching;Grimacing Pain Intervention(s): Limited activity within patient's tolerance;Monitored during session;Premedicated before session;Repositioned  Home Living                                          Prior Functioning/Environment              Frequency Min 2X/week     Progress Toward Goals  OT Goals(current goals can now be found in the care plan section)  Progress towards OT goals: Goals met/education completed, patient discharged from OT  Acute Rehab OT Goals Patient Stated Goal: To return home  Plan Discharge plan remains appropriate    Co-evaluation                 End of Session Equipment  Utilized During Treatment: Rolling walker   Activity Tolerance Patient tolerated treatment well   Patient Left in chair;with call bell/phone within reach;with family/visitor present   Nurse Communication          Time: 5056-9794 OT Time Calculation (min): 26 min  Charges: OT General Charges $OT Visit: 1 Procedure OT Treatments $Self Care/Home Management : 23-37 mins  Janice Coffin, COTA/L 05/26/2014, 9:50 AM

## 2014-05-26 NOTE — Progress Notes (Signed)
Physical Therapy Treatment Patient Details Name: Chelsea Callahan MRN: [id number] DOB: 05-08-1950 Today's Date: 05/26/2014    History of Present Illness Patient is a 64 yo female admitted 05/23/14 now s/p Rt TKA.  PMH:  RA, anemia, headaches    PT Comments    Noting general improvements in smoothness of gait pattern and nice, stable R knee in stance; Pain was most limiting factor this am session (no reports of lightheadedness throughout session); Noted to receive PRBCs today; anticipate good progress   Follow Up Recommendations  Home health PT;Supervision/Assistance - 24 hour     Equipment Recommendations  None recommended by PT    Recommendations for Other Services       Precautions / Restrictions Precautions Precautions: Knee Precaution Comments: Reviewed precautions with patient and her husband. Required Braces or Orthoses: Knee Immobilizer - Right Knee Immobilizer - Right: On when out of bed or walking (Nice, stable knee in stance, so we opted to amb without KI) Restrictions RLE Weight Bearing: Weight bearing as tolerated    Mobility  Bed Mobility                  Transfers Overall transfer level: Needs assistance Equipment used: Rolling walker (2 wheeled) Transfers: Sit to/from Stand Sit to Stand: Min guard (assist to manage Rt leg during sit to/from stand)         General transfer comment: Patient needs assistance to manage Rt leg during transfers to manage pain; gave instructional cues to preposition RLE for comfort during transitions; husband present and able to give correct assist  Ambulation/Gait Ambulation/Gait assistance: Min guard Ambulation Distance (Feet): 15 Feet Assistive device: Rolling walker (2 wheeled) Gait Pattern/deviations: Step-through pattern Gait velocity: Decreased   General Gait Details: Limited amb as pt was in a lot of pain after stair practice; noting general improvements in gait, though, with no need to Use UE assist to advance  RLE   Stairs Stairs: Yes Stairs assistance: Min guard Stair Management: One rail Right;Step to pattern;Sideways Number of Stairs: 5 (in stairwell) General stair comments: verbal and demo cues for technique  Wheelchair Mobility    Modified Rankin (Stroke Patients Only)       Balance                                    Cognition Arousal/Alertness: Awake/alert Behavior During Therapy: WFL for tasks assessed/performed Overall Cognitive Status: Within Functional Limits for tasks assessed                      Exercises Total Joint Exercises Ankle Circles/Pumps:  (Had to defer therex due to pain for this session)    General Comments        Pertinent Vitals/Pain Pain Assessment: 0-10 Pain Score: 9  (5/10 initially) Pain Location: R knee after amb and steps Pain Descriptors / Indicators: Aching;Grimacing Pain Intervention(s): Limited activity within patient's tolerance;Monitored during session;Premedicated before session;Repositioned    Home Living                      Prior Function            PT Goals (current goals can now be found in the care plan section) Acute Rehab PT Goals Patient Stated Goal: To return home PT Goal Formulation: With patient/family Time For Goal Achievement: 05/30/14 Potential to Achieve Goals: Good Progress towards PT goals: Progressing toward  goals    Frequency  7X/week    PT Plan Current plan remains appropriate    Co-evaluation             End of Session Equipment Utilized During Treatment: Gait belt Activity Tolerance: Patient limited by pain Patient left: in chair;with call bell/phone within reach;with family/visitor present (and husband present)     Time: 8110-3159 PT Time Calculation (min) (ACUTE ONLY): 32 min  Charges:  $Gait Training: 23-37 mins                    G Codes:      [name] 05/26/2014, 9:45 AM  [name], PT  Acute Rehabilitation Services Pager  [phone] Office [phone]

## 2014-05-26 NOTE — Progress Notes (Signed)
05/26/14 Contacted Edwina at [facility] and set up HHPT and HHOT, anticipated d/c date 05/27/14. Spoke with patient  and her husband, verified that she will have [facility] HHPT/OT and that she has a rolling walker and elevated toilet seat at home. No equipment needs identified. Jacquelynn Cree RN,BSN, CCM   05/24/2014 10:52 AM  NCM spoke to pt and states she had [facility] last surgery. Pt requesting [facility] for [facility]. States she has RW and 3n1 at home. Isidoro Donning RN CCM Case Mgmt phone [phone]

## 2014-05-26 NOTE — Progress Notes (Signed)
Subjective: Patient complains of feeling "tired" and has increased right leg pain.  "more pain in this knee than when I had my left done".  No complaints of cp, sob.  No bowel movement yet.  Denies hx of melena, hematochezia.  Previous gastric bypass surgery.     Objective: Vital signs in last 24 hours: Temp:  [98.4 F (36.9 C)-98.8 F (37.1 C)] 98.4 F (36.9 C) (01/25 0527) Pulse Rate:  [81-92] 81 (01/25 0527) Resp:  [18] 18 (01/25 0527) BP: (116-144)/(42-50) 116/48 mmHg (01/25 0527) SpO2:  [100 %] 100 % (01/25 0527)  Intake/Output from previous day: 01/24 0701 - 01/25 0700 In: 920 [P.O.:920] Out: -  Intake/Output this shift:     Recent Labs  05/24/14 0405 05/25/14 0521 05/26/14 0530  HGB 8.6* 7.8* 7.0*    Recent Labs  05/25/14 0521 05/26/14 0530  WBC 7.7 5.8  RBC 2.91* 2.61*  HCT 24.7* 22.2*  PLT 221 212    Recent Labs  05/24/14 0405  NA 136  K 4.2  CL 102  CO2 23  BUN 9  CREATININE 0.88  GLUCOSE 114*  CALCIUM 9.5   No results for input(s): LABPT, INR in the last 72 hours.  Exam:  Patient looks somewhat lethargic but alert and oriented.  aquacel dressing intact.  Right thigh to calf mod-marked tenderness.  Swelling thigh to calf.  Left calf less tender.  NVI.  No signs of infection.    Assessment/Plan: -Symptomatic blood loss anemia.  Will transfuse 2 units of PRBC's.  Patient has a hx of anemia but states that she has not had formal GI workup with endoscopy/colonoscopy and she is not interested.  Preop Hgb 11.2.   -increased right leg pain.  Will order STAT right le venous doppler to r/o dvt.   -dulcolax supp today.     Maurina Fawaz M 05/26/2014, 9:16 AM

## 2014-05-26 NOTE — Progress Notes (Signed)
Ready to start blood started NS pt c/o site burning 2 nursres checked no site found order in for IV team

## 2014-05-26 NOTE — Progress Notes (Signed)
Physical Therapy Treatment Patient Details Name: Chelsea Callahan MRN: [id number] DOB: 11-25-1950 Today's Date: 05/26/2014    History of Present Illness  Admitted for  RTKA; low hgb postop    PT Comments    Session focused on therapeutic exercise; premorbid R hip weakness limiting independence with heel slides and straight leg raises, but noted very nic quad activation with therex aimed at knee extension; Discussed optimal positioning for healing, rationale for propping heel to promote knee extension;  To receive blood today; Will likely feel much better tomorrow, and still anticipate dc home tomorrow.   Follow Up Recommendations  Home health PT;Supervision/Assistance - 24 hour     Equipment Recommendations  None recommended by PT    Recommendations for Other Services       Precautions / Restrictions Precautions Precautions: Knee Precaution Comments: Pt educated to not allow any pillow or bolster under knee for healing with optimal range of motion.  Knee Immobilizer - Right: On when out of bed or walking (Nice, stable knee in stance, so we opted to amb without KI) Restrictions RLE Weight Bearing: Weight bearing as tolerated    Mobility  Bed Mobility                  Transfers                    Ambulation/Gait                 Stairs            Wheelchair Mobility    Modified Rankin (Stroke Patients Only)       Balance                                    Cognition Arousal/Alertness: Awake/alert Behavior During Therapy: WFL for tasks assessed/performed Overall Cognitive Status: Within Functional Limits for tasks assessed                      Exercises Total Joint Exercises Ankle Circles/Pumps: AROM;Both;20 reps Quad Sets: AROM;Right;20 reps Gluteal Sets: AROM;Both;10 reps Towel Squeeze: AROM;Both;10 reps Short Arc QuadBarbaraann Boys;Right;10 reps Heel Slides: AAROM;Right;10 reps Hip ABduction/ADduction:  AROM;Right;10 reps (Isometric abduction) Straight Leg Raises:  (pt politely declined) Goniometric ROM: approx 5-60 degrees    General Comments General comments (skin integrity, edema, etc.): IV team just in to start an IV for blood transfusion; Given low hgb and to recieve blood this afternoon, we decided to hold OOB activity       Pertinent Vitals/Pain Pain Assessment: 0-10 Pain Score: 6  Pain Location: R knee Pain Descriptors / Indicators: Aching Pain Intervention(s): Monitored during session;Repositioned    Home Living                      Prior Function            PT Goals (current goals can now be found in the care plan section) Acute Rehab PT Goals Patient Stated Goal: To return home PT Goal Formulation: With patient/family Time For Goal Achievement: 05/30/14 Potential to Achieve Goals: Good Progress towards PT goals: Progressing toward goals    Frequency  7X/week    PT Plan Current plan remains appropriate    Co-evaluation             End of Session   Activity Tolerance: Patient tolerated treatment well Patient left: in  bed;with call bell/phone within reach;with nursing/sitter in room (starting PRBCs)     Time: 6333-5456 PT Time Calculation (min) (ACUTE ONLY): 24 min  Charges:  $Therapeutic Exercise: 23-37 mins                    G Codes:      [name] 05/26/2014, 2:48 PM  [name], [name]  Acute Rehabilitation Services Pager [phone] Office [phone]

## 2014-05-26 NOTE — Clinical Documentation Improvement (Signed)
ICD-10 coding requires specificity of osteoarthritis as primary, secondary, or post-traumatic.  Please document in your progress note and carry over to the discharge summary this additional information for the patient's osteoarthritis being treated this admission with the total knee replacement.  Thank you, Doy Mince, RN [phone] Clinical Documentation Specialist

## 2014-05-27 ENCOUNTER — Encounter (HOSPITAL_COMMUNITY): Payer: Self-pay | Admitting: Surgery

## 2014-05-27 DIAGNOSIS — M1711 Unilateral primary osteoarthritis, right knee: Secondary | ICD-10-CM | POA: Diagnosis present

## 2014-05-27 LAB — TYPE AND SCREEN
ABO/RH(D): A POS
ANTIBODY SCREEN: NEGATIVE
Unit division: 0
Unit division: 0

## 2014-05-27 LAB — HEMOGLOBIN AND HEMATOCRIT, BLOOD
HCT: 30.7 % — ABNORMAL LOW (ref 36.0–46.0)
HEMOGLOBIN: 9.9 g/dL — AB (ref 12.0–15.0)

## 2014-05-27 NOTE — Progress Notes (Signed)
Patient ID: Chelsea Callahan, female   DOB: 01/11/1951, 64 y.o.   MRN: [id number]   Reviewed venous doppler report.  This was negative for DVT.  Ok to d/c home today after PT.  F/u with Dr Ophelia Charter as scheduled.

## 2014-05-27 NOTE — Progress Notes (Signed)
Subjective: Feeling much better after transfusion.  Had a good bowel movement.  Ready to go home.     Objective: Vital signs in last 24 hours: Temp:  [98.3 F (36.8 C)-99.3 F (37.4 C)] 98.3 F (36.8 C) (01/26 0524) Pulse Rate:  [78-98] 86 (01/26 0524) Resp:  [16-18] 18 (01/26 0524) BP: (103-129)/(45-66) 120/64 mmHg (01/26 0524) SpO2:  [94 %-100 %] 99 % (01/26 0524)  Intake/Output from previous day: 01/25 0701 - 01/26 0700 In: 1595 [P.O.:960; Blood:635] Out: -  Intake/Output this shift:     Recent Labs  05/25/14 0521 05/26/14 0530 05/27/14 0230  HGB 7.8* 7.0* 9.9*    Recent Labs  05/25/14 0521 05/26/14 0530 05/27/14 0230  WBC 7.7 5.8  --   RBC 2.91* 2.61*  --   HCT 24.7* 22.2* 30.7*  PLT 221 212  --    No results for input(s): NA, K, CL, CO2, BUN, CREATININE, GLUCOSE, CALCIUM in the last 72 hours. No results for input(s): LABPT, INR in the last 72 hours.  Exam:  aquacel dressing intact.  Distal tibia pin sites look good.  No signs of infection.    Assessment/Plan: D/c home today after PT.  F/u with dr Ophelia Charter one week.  Scripts on chart.     Chelsea Callahan M 05/27/2014, 8:21 AM

## 2014-05-27 NOTE — Progress Notes (Signed)
Right lower extremity venous duplex completed.  Right:  No evidence of DVT or superficial thrombosis.  There appears to be a Baker's cyst in the right pop fossa.  Left:  Negative for DVT in the common femoral vein.

## 2014-05-27 NOTE — Progress Notes (Signed)
Patient ID: Chelsea Callahan, female   DOB: Jun 18, 1950, 64 y.o.   MRN: [id number]   STAT Venous doppler that I ordered yesterday morning was not done.  Spoke with RN's about this along with Publishing copy.  Also had phone conversation with Rich down in vascular lab.  Stated that there was a "mix up" with another patient.  Advised Rich and staff on unit that this needs to be done ASAP.  Second order was done.  Discussed with my attending Dr Annell Greening.

## 2014-05-27 NOTE — Progress Notes (Signed)
Physical Therapy Treatment Patient Details Name: Chelsea Callahan MRN: [id number] DOB: Mar 22, 1951 Today's Date: 05/27/2014    History of Present Illness Patient is a 64 yo female admitted 05/23/14 now s/p Rt TKA.  PMH:  RA, anemia, headaches    PT Comments    Session focused on teaching/problem-solving through tasks, and answering questions to boost pt's confidence, and help with transition home; Definitely better progress since last session with much better activity tolerance; OK for dc home from PT standpoint    Follow Up Recommendations  Home health PT;Supervision/Assistance - 24 hour     Equipment Recommendations  None recommended by PT    Recommendations for Other Services       Precautions / Restrictions Precautions Precautions: Knee Precaution Booklet Issued: Yes (comment) Precaution Comments: Pt educated to not allow any pillow or bolster under knee for healing with optimal range of motion.  Restrictions RLE Weight Bearing: Weight bearing as tolerated    Mobility  Bed Mobility Overal bed mobility: Needs Assistance Bed Mobility: Sit to Supine       Sit to supine: Min assist   General bed mobility comments: min assist for RLE  Transfers Overall transfer level: Needs assistance Equipment used: Rolling walker (2 wheeled) Transfers: Sit to/from Stand Sit to Stand: Supervision         General transfer comment: Practiced wasy sto make the sit to/from stand transition more manageable, including using a belt to support R lower leg  without assist and easing foot to the floor  Ambulation/Gait Ambulation/Gait assistance: Supervision Ambulation Distance (Feet): 120 Feet Assistive device: Rolling walker (2 wheeled) Gait Pattern/deviations: Step-through pattern     General Gait Details: Cues to self-monitor for activity tolerance   Stairs Stairs:  (Pt politely declined; we reviewed technique)          Wheelchair Mobility    Modified Rankin (Stroke Patients  Only)       Balance                                    Cognition Arousal/Alertness: Awake/alert Behavior During Therapy: WFL for tasks assessed/performed Overall Cognitive Status: Within Functional Limits for tasks assessed                      Exercises      General Comments        Pertinent Vitals/Pain Pain Assessment: Faces Faces Pain Scale: Hurts little more Pain Location: R knee with flexion Pain Descriptors / Indicators: Grimacing Pain Intervention(s): Monitored during session;Premedicated before session;Repositioned    Home Living                      Prior Function            PT Goals (current goals can now be found in the care plan section) Acute Rehab PT Goals Patient Stated Goal: To return home PT Goal Formulation: With patient/family Time For Goal Achievement: 05/30/14 Potential to Achieve Goals: Good Progress towards PT goals: Progressing toward goals    Frequency  7X/week    PT Plan Current plan remains appropriate    Co-evaluation             End of Session Equipment Utilized During Treatment: Gait belt Activity Tolerance: Patient tolerated treatment well Patient left: in bed;with call bell/phone within reach (preparing for dopplers)     Time: 6967-8938 PT Time Calculation (  min) (ACUTE ONLY): 27 min  Charges:  $Gait Training: 8-22 mins $Therapeutic Activity: 8-22 mins                    G Codes:      [name] Hamff 05/27/2014, 10:09 AM  [name], PT  Acute Rehabilitation Services Pager [phone] Office [phone]

## 2014-06-16 ENCOUNTER — Ambulatory Visit: Payer: 59 | Admitting: Physical Therapy

## 2014-06-24 ENCOUNTER — Encounter: Payer: Self-pay | Admitting: Physical Therapy

## 2014-06-24 ENCOUNTER — Ambulatory Visit: Payer: BLUE CROSS/BLUE SHIELD | Attending: Orthopaedic Surgery | Admitting: Physical Therapy

## 2014-06-24 DIAGNOSIS — Z96651 Presence of right artificial knee joint: Secondary | ICD-10-CM | POA: Diagnosis not present

## 2014-06-24 DIAGNOSIS — R600 Localized edema: Secondary | ICD-10-CM

## 2014-06-24 DIAGNOSIS — M25661 Stiffness of right knee, not elsewhere classified: Secondary | ICD-10-CM | POA: Diagnosis not present

## 2014-06-24 DIAGNOSIS — R609 Edema, unspecified: Secondary | ICD-10-CM | POA: Diagnosis not present

## 2014-06-24 DIAGNOSIS — Z4789 Encounter for other orthopedic aftercare: Secondary | ICD-10-CM | POA: Diagnosis not present

## 2014-06-24 NOTE — Therapy (Signed)
[facility] [place] [facility] [address] [place], [place], [address] Phone: [phone]   Fax:  [phone]  Physical Therapy Evaluation  Patient Details  Name: Chelsea Callahan MRN: [id number] Date of Birth: Jan 08, 1951 Referring Provider:  Eldred Manges, MD  Encounter Date: 06/24/2014    Past Medical History  Diagnosis Date  . RA (rheumatoid arthritis)   . Anemia   . Vasculitis     on prednisone -   . PONV (postoperative nausea and vomiting)   . Spinal headache     c-section  . Raynaud disease   . Primary osteoarthritis of right knee     Past Surgical History  Procedure Laterality Date  . Cesarean section      x 2  . Hernia repair    . Gastric bypass    . Tubal ligation    . Abdominal clip      to stomach post gastric bypass  . Knee arthroplasty Left 09/13/2013    Procedure: LEFT COMPUTER ASSISTED TOTAL KNEE ARTHROPLASTY;  Surgeon: Eldred Manges, MD;  Location: [facility] OR;  Service: Orthopedics;  Laterality: Left;  Left Total Knee Arthroplasty, Computer Assist  . Joint replacement Right     hip  . Total knee arthroplasty Right 05/22/2014    dr Ophelia Charter  . Knee arthroplasty Right 05/23/2014    Procedure: COMPUTER ASSISTED TOTAL KNEE ARTHROPLASTY;  Surgeon: Eldred Manges, MD;  Location: [facility] OR;  Service: Orthopedics;  Laterality: Right;    There were no vitals taken for this visit.  Visit Diagnosis:  Knee stiffness, right - Plan: PT plan of care cert/re-cert  Edema of right lower extremity - Plan: PT plan of care cert/re-cert      Subjective Assessment - 06/24/14 1529    Symptoms Patient underwent a right TKR on 05/23/14.   Pertinent History had left TKR May 2015, right THR 11 years ago   Limitations Standing;Walking;House hold activities   How long can you stand comfortably? 5 minutes   How long can you walk comfortably? 5 minutes   Patient Stated Goals walk and go up stairs normally without pain   Currently in Pain? Yes   Pain  Score 4    Pain Location Knee   Pain Orientation Right   Pain Descriptors / Indicators Aching;Constant;Tightness   Pain Type Surgical pain   Pain Onset 1 to 4 weeks ago   Pain Frequency Constant   Aggravating Factors  without pain meds and with activity the pain is a 7/10   Pain Relieving Factors with rest and pain meds pain a 3-4/10   Effect of Pain on Daily Activities limited in all ADL's due to pain and deconditioning          [facility] PT Assessment - 06/24/14 0001    Assessment   Medical Diagnosis S/P right TKR   Onset Date 05/23/14   Next MD Visit 2 weeks   Prior Therapy 4 days in the hospital, then home PT   Precautions   Precautions None   Restrictions   Weight Bearing Restrictions No   Balance Screen   Has the patient fallen in the past 6 months No   Has the patient had a decrease in activity level because of a fear of falling?  No   Is the patient reluctant to leave their home because of a fear of falling?  No   Home Environment   Living Enviornment Private residence   Living Arrangements Spouse/significant other  Type of Home House   Home Access Stairs to enter   Additional Comments stairs at home   Prior Function   Level of Independence Independent with homemaking with ambulation;Independent with basic ADLs;Independent with gait   Leisure 2x/week at [employer]   AROM   AROM Assessment Site Knee   Right/Left Knee Right   Right Knee Extension 20   Right Knee Flexion 83   PROM   Overall PROM Comments 5-94 degrees flexion   Strength   Strength Assessment Site Knee   Right/Left Knee Right   Right Knee Flexion 3+/5   Right Knee Extension 3+/5   Palpation   Palpation swollen in the knee and shin area, scar is healing, tender, scar is mobilt   Ambulation/Gait   Stairs Yes   Stairs Assistance 5: Supervision   Stair Management Technique Step to pattern   Gait Comments uses a SPC, slow, stiff legged on the right                  [facility] Adult PT  Treatment/Exercise - 07-06-2014 0001    Knee/Hip Exercises: Aerobic   Stationary Bike 5 mins partial revs   Elliptical Nustep 5 mins level 5   Knee/Hip Exercises: Machines for Strengthening   Cybex Knee Extension 5# 2x10   Cybex Knee Flexion 20# 2x10                  PT Short Term Goals - Jul 06, 2014 1604    PT SHORT TERM GOAL #1   Title independent with initial HEP   Time 2   Period Weeks   Status New           PT Long Term Goals - 2014-07-06 1605    PT LONG TERM GOAL #1   Title independent with RICE   Time 8   Period Weeks   Status New   PT LONG TERM GOAL #2   Title increase ROM of the right knee to 0-110 degrees flexion   Time 8   Period Weeks   Status New   PT LONG TERM GOAL #3   Title walk without assistive device   Time 8   Period Weeks   Status New   PT LONG TERM GOAL #4   Title go up and down stairs step over step   Time 8   Period Weeks   Status New   PT LONG TERM GOAL #5   Title increase strength of the right knee to 4/5   Time 8   Period Weeks   Status New               Plan - July 06, 2014 1552    Clinical Impression Statement Patient underwent a right TKR on 05/23/14.  She has some significant swelling, decreased ROM and difficulty walking   Pt will benefit from skilled therapeutic intervention in order to improve on the following deficits Abnormal gait;Decreased balance;Decreased mobility;Increased edema;Decreased range of motion;Decreased strength;Difficulty walking;Pain   Rehab Potential Good   PT Frequency 3x / week   PT Duration 8 weeks   PT Treatment/Interventions Ultrasound;Cryotherapy;Electrical Stimulation;Functional mobility training;Stair training;Gait training;Therapeutic exercise;Balance training;Neuromuscular re-education;Patient/family education;Passive range of motion;Scar mobilization;Manual techniques   PT Next Visit Plan add exercises   PT Home Exercise Plan add HEP next visit to increase ROM   Consulted and Agree with  Plan of Care Patient          G-Codes - July 06, 2014 1607    Functional Assessment Tool Used FOTO  Functional Limitation Mobility: Walking and moving around   Mobility: Walking and Moving Around Current Status [phone]) At least 60 percent but less than 80 percent impaired, limited or restricted   Mobility: Walking and Moving Around Goal Status [phone]) At least 40 percent but less than 60 percent impaired, limited or restricted       Problem List Patient Active Problem List   Diagnosis Date Noted  . Osteoarthritis of right knee 05/27/2014  . Total knee replacement status 05/23/2014  . Rheumatoid arthritis 09/24/2013  . Osteoarthritis of left knee 09/13/2013    Class: Diagnosis of    Jearld Lesch, PT 06/24/2014, 4:10 PM  [facility] [place] [facility] [address] [place], [place], [address] Phone: [phone]   Fax:  [phone]

## 2014-06-24 NOTE — Discharge Summary (Signed)
Patient ID: Chelsea Callahan MRN: [id number] DOB/AGE: 63-Jan-1952 63 y.o.  Admit date: 05/23/2014 Discharge date: 06/24/2014  Admission Diagnoses:  Active Problems:   Total knee replacement status   Osteoarthritis of right knee   Discharge Diagnoses:  Active Problems:   Total knee replacement status   Osteoarthritis of right knee  status post Procedure(s): COMPUTER ASSISTED TOTAL KNEE ARTHROPLASTY  Past Medical History  Diagnosis Date  . RA (rheumatoid arthritis)   . Anemia   . Vasculitis     on prednisone -   . PONV (postoperative nausea and vomiting)   . Spinal headache     c-section  . Raynaud disease   . Primary osteoarthritis of right knee     Surgeries: Procedure(s): COMPUTER ASSISTED TOTAL KNEE ARTHROPLASTY on 05/23/2014   Consultants:    Discharged Condition: Improved  Hospital Course: Chelsea Callahan is an 64 y.o. female who was admitted 05/23/2014 for operative treatment of knee djd. Patient failed conservative treatments (please see the history and physical for the specifics) and had severe unremitting pain that affects sleep, daily activities and work/hobbies. After pre-op clearance, the patient was taken to the operating room on 05/23/2014 and underwent  Procedure(s): COMPUTER ASSISTED TOTAL KNEE ARTHROPLASTY.    Patient was given perioperative antibiotics:  Anti-infectives    Start     Dose/Rate Route Frequency Ordered Stop   05/23/14 1400  ceFAZolin (ANCEF) IVPB 1 g/50 mL premix     1 g 100 mL/hr over 30 Minutes Intravenous 3 times per day 05/23/14 1144 05/23/14 1554   05/23/14 0600  ceFAZolin (ANCEF) IVPB 2 g/50 mL premix     2 g 100 mL/hr over 30 Minutes Intravenous On call to O.R. 05/22/14 1408 05/23/14 0815       Patient was given sequential compression devices and early ambulation to prevent DVT.   Patient benefited maximally from hospital stay and there were no complications. At the time of discharge, the patient was urinating/moving their bowels  without difficulty, tolerating a regular diet, pain is controlled with oral pain medications and they have been cleared by PT/OT.   Recent vital signs: No data found.    Recent laboratory studies: No results for input(s): WBC, HGB, HCT, PLT, NA, K, CL, CO2, BUN, CREATININE, GLUCOSE, INR, CALCIUM in the last 72 hours.  Invalid input(s): PT, 2   Discharge Medications:     Medication List    STOP taking these medications        multivitamin with minerals tablet     OVER THE COUNTER MEDICATION     oxyCODONE-acetaminophen 5-325 MG per tablet  Commonly known as:  ROXICET  Replaced by:  oxyCODONE-acetaminophen 7.5-325 MG per tablet     TART CHERRY ADVANCED Caps      TAKE these medications        aspirin EC 325 MG tablet  Take 1 tablet (325 mg total) by mouth daily.     aspirin EC 325 MG tablet  Take 1 tablet (325 mg total) by mouth daily.     hydroxychloroquine 200 MG tablet  Commonly known as:  PLAQUENIL  Take 100-200 mg by mouth 2 (two) times daily. 200 mg in am and 100 mg in pm.     methocarbamol 500 MG tablet  Commonly known as:  ROBAXIN  Take 1 tablet (500 mg total) by mouth every 6 (six) hours as needed for muscle spasms.     ondansetron 4 MG disintegrating tablet  Commonly known as:  ZOFRAN ODT  Take 1 tablet (4 mg total) by mouth every 8 (eight) hours as needed.     oxyCODONE-acetaminophen 7.5-325 MG per tablet  Commonly known as:  PERCOCET  Take 1 tablet by mouth every 6 (six) hours as needed for pain.     predniSONE 1 MG tablet  Commonly known as:  DELTASONE  Take 1 mg by mouth daily with breakfast.        Diagnostic Studies: No results found.      Discharge Instructions    Call MD / Call 911    Complete by:  As directed   If you experience chest pain or shortness of breath, CALL 911 and be transported to the hospital emergency room.  If you develope a fever above 101 F, pus (white drainage) or increased drainage or redness at the wound, or calf  pain, call your surgeon's office.     Call MD / Call 911    Complete by:  As directed   If you experience chest pain or shortness of breath, CALL 911 and be transported to the hospital emergency room.  If you develope a fever above 101 F, pus (white drainage) or increased drainage or redness at the wound, or calf pain, call your surgeon's office.     Change dressing    Complete by:  As directed   Change dressing on right  knee daily with sterile 4 x 4 inch gauze dressing and apply TED hose.     Constipation Prevention    Complete by:  As directed   Drink plenty of fluids.  Prune juice may be helpful.  You may use a stool softener, such as Colace (over the counter) 100 mg twice a day.  Use MiraLax (over the counter) for constipation as needed.     Constipation Prevention    Complete by:  As directed   Drink plenty of fluids.  Prune juice may be helpful.  You may use a stool softener, such as Colace (over the counter) 100 mg twice a day.  Use MiraLax (over the counter) for constipation as needed.     Diet - low sodium heart healthy    Complete by:  As directed      Diet - low sodium heart healthy    Complete by:  As directed      Discharge instructions    Complete by:  As directed   Ok to shower, but no tub soaking.  Do not apply any creams or ointments to incision.  Continue physical therapy protocol.  Elevate foot above heart level as much as possible.  Use ice off and on.     Discharge instructions    Complete by:  As directed   Ok to shower, but no tub soaking.  Do not apply any creams or ointments to incision. Leave Aquacel dressing on until return office visit.   Continue physical therapy protocol.  Use aspirin 325mg  one tablet daily by mouth for DVT prophylaxis.     Do not put a pillow under the knee. Place it under the heel.    Complete by:  As directed      Do not put a pillow under the knee. Place it under the heel.    Complete by:  As directed      Driving restrictions     Complete by:  As directed   No driving until further notice.     Driving restrictions    Complete by:  As directed   No  driving until further notice.     Increase activity slowly as tolerated    Complete by:  As directed      Increase activity slowly as tolerated    Complete by:  As directed      Lifting restrictions    Complete by:  As directed   No lifting until further notice.     TED hose    Complete by:  As directed   Use stockings (TED hose) for 3-4 weeks on both leg(s).  You may remove them at night for sleeping.           Follow-up Information    Follow up with Eldred Manges, MD.   Specialty:  Orthopedic Surgery   Why:  need return office visit 1 week.    Contact information:   [address] [place] [address] [phone]       Follow up with [facility].   Specialty:  Home Health Services   Why:  They will contact you to schedule home therapy visits.   Contact information:   [address] [place] [address] [phone]       Follow up with Eldred Manges, MD.   Specialty:  Orthopedic Surgery   Why:  needs return office visit in one week.  sooner if needed.     Contact information:   [address] [place] [address] [phone]       Discharge Plan:  discharge to   Disposition:     Signed: Naida Sleight 06/24/2014, 2:58 PM

## 2014-06-26 ENCOUNTER — Ambulatory Visit: Payer: BLUE CROSS/BLUE SHIELD | Admitting: Physical Therapy

## 2014-06-26 DIAGNOSIS — R600 Localized edema: Secondary | ICD-10-CM

## 2014-06-26 DIAGNOSIS — M25661 Stiffness of right knee, not elsewhere classified: Secondary | ICD-10-CM

## 2014-06-26 DIAGNOSIS — Z4789 Encounter for other orthopedic aftercare: Secondary | ICD-10-CM | POA: Diagnosis not present

## 2014-06-26 NOTE — Therapy (Signed)
[facility] [place] [facility] [address] [place], [place], [address] Phone: [phone]   Fax:  [phone]  Physical Therapy Treatment  Patient Details  Name: Chelsea Callahan MRN: [id number] Date of Birth: 1950/07/21 Referring Provider:  Eldred Manges, MD  Encounter Date: 06/26/2014      PT End of Session - 06/26/14 1341    Visit Number 2   Number of Visits 16   Date for PT Re-Evaluation 08/23/14   PT Start Time 0100   PT Stop Time 0155   PT Time Calculation (min) 55 min   Activity Tolerance Patient tolerated treatment well      Past Medical History  Diagnosis Date  . RA (rheumatoid arthritis)   . Anemia   . Vasculitis     on prednisone -   . PONV (postoperative nausea and vomiting)   . Spinal headache     c-section  . Raynaud disease   . Primary osteoarthritis of right knee     Past Surgical History  Procedure Laterality Date  . Cesarean section      x 2  . Hernia repair    . Gastric bypass    . Tubal ligation    . Abdominal clip      to stomach post gastric bypass  . Knee arthroplasty Left 09/13/2013    Procedure: LEFT COMPUTER ASSISTED TOTAL KNEE ARTHROPLASTY;  Surgeon: Eldred Manges, MD;  Location: [facility] OR;  Service: Orthopedics;  Laterality: Left;  Left Total Knee Arthroplasty, Computer Assist  . Joint replacement Right     hip  . Total knee arthroplasty Right 05/22/2014    dr Ophelia Charter  . Knee arthroplasty Right 05/23/2014    Procedure: COMPUTER ASSISTED TOTAL KNEE ARTHROPLASTY;  Surgeon: Eldred Manges, MD;  Location: [facility] OR;  Service: Orthopedics;  Laterality: Right;    There were no vitals taken for this visit.  Visit Diagnosis:  Knee stiffness, right  Edema of right lower extremity      Subjective Assessment - 06/26/14 1303    Symptoms increased swelling today. still not sleeping well   Currently in Pain? No/denies  uncomfortable but not in pain due to meds   Pain Score --  4/10 when not on meds, 0/10 with  meds   Pain Location Knee   Pain Orientation Right   Pain Descriptors / Indicators --  shooting pains when it does hurt and achey without meds.   Pain Type Acute pain   Pain Onset More than a month ago   Pain Frequency Intermittent   Aggravating Factors  not on meds and elevated, extention in bed   Pain Relieving Factors meds   Effect of Pain on Daily Activities sleeping and ADL's   Multiple Pain Sites No                    [facility] Adult PT Treatment/Exercise - 06/26/14 0001    Exercises   Exercises Knee/Hip   Knee/Hip Exercises: Stretches   Passive Hamstring Stretch 2 reps;60 seconds  with foot on high step    Knee: Self-Stretch to increase Flexion 30 seconds;3 reps   Knee/Hip Exercises: Machines for Strengthening   Cybex Knee Extension 5# 2X10   Cybex Knee Flexion 20# 2X10   Modalities   Modalities Electrical Stimulation;Cryotherapy   Cryotherapy   Type of Cryotherapy Other (comment)   Electrical Stimulation   Electrical Stimulation Location 15 min  vaso med  PT Education - 06/26/14 1319    Education provided Yes   Education Details copy of flexion stretch seated EOB scoot forward. Hold for 30 seconds 3 time each several times per day   Person(s) Educated Patient   Methods Explanation;Handout   Comprehension Verbalized understanding;Returned demonstration          PT Short Term Goals - 06/24/14 1604    PT SHORT TERM GOAL #1   Title independent with initial HEP   Time 2   Period Weeks   Status New           PT Long Term Goals - 06/24/14 1605    PT LONG TERM GOAL #1   Title independent with RICE   Time 8   Period Weeks   Status New   PT LONG TERM GOAL #2   Title increase ROM of the right knee to 0-110 degrees flexion   Time 8   Period Weeks   Status New   PT LONG TERM GOAL #3   Title walk without assistive device   Time 8   Period Weeks   Status New   PT LONG TERM GOAL #4   Title go up and down stairs step over  step   Time 8   Period Weeks   Status New   PT LONG TERM GOAL #5   Title increase strength of the right knee to 4/5   Time 8   Period Weeks   Status New               Plan - 06/26/14 1343    Clinical Impression Statement patient has increased swelling today. well motivated PT. Pt tolerated all ther ex without c/o of pain   PT Frequency 3x / week   PT Duration 8 weeks   PT Next Visit Plan Continue with ROM and Strength. Assess HEP last given   PT Home Exercise Plan continue with ther ex         Problem List Patient Active Problem List   Diagnosis Date Noted  . Osteoarthritis of right knee 05/27/2014  . Total knee replacement status 05/23/2014  . Rheumatoid arthritis 09/24/2013  . Osteoarthritis of left knee 09/13/2013    Class: Diagnosis of     Lady Deutscher, PTA  06/26/2014, 2:00 PM  [facility] [place] [facility] [address] [place], [place], [address] Phone: [phone]   Fax:  [phone]

## 2014-07-01 ENCOUNTER — Ambulatory Visit: Payer: BLUE CROSS/BLUE SHIELD | Attending: Orthopaedic Surgery | Admitting: Physical Therapy

## 2014-07-01 ENCOUNTER — Encounter: Payer: Self-pay | Admitting: Physical Therapy

## 2014-07-01 DIAGNOSIS — R600 Localized edema: Secondary | ICD-10-CM

## 2014-07-01 DIAGNOSIS — Z4789 Encounter for other orthopedic aftercare: Secondary | ICD-10-CM | POA: Diagnosis not present

## 2014-07-01 DIAGNOSIS — R609 Edema, unspecified: Secondary | ICD-10-CM | POA: Diagnosis not present

## 2014-07-01 DIAGNOSIS — M25661 Stiffness of right knee, not elsewhere classified: Secondary | ICD-10-CM

## 2014-07-01 DIAGNOSIS — Z96651 Presence of right artificial knee joint: Secondary | ICD-10-CM | POA: Insufficient documentation

## 2014-07-01 NOTE — Therapy (Signed)
[facility] [place] [address] [place], [place], [address] Phone: [phone]   Fax:  [phone]  Physical Therapy Treatment  Patient Details  Name: Chelsea Callahan MRN: [id number] Date of Birth: 1950-11-02 Referring Provider:  Eldred Manges, MD  Encounter Date: 07/01/2014      PT End of Session - 07/01/14 1542    Visit Number 3   Number of Visits 16   Date for PT Re-Evaluation 08/23/14   PT Start Time 1458   PT Stop Time 1552   PT Time Calculation (min) 54 min   Activity Tolerance Patient limited by pain;Patient limited by fatigue      Past Medical History  Diagnosis Date  . RA (rheumatoid arthritis)   . Anemia   . Vasculitis     on prednisone -   . PONV (postoperative nausea and vomiting)   . Spinal headache     c-section  . Raynaud disease   . Primary osteoarthritis of right knee     Past Surgical History  Procedure Laterality Date  . Cesarean section      x 2  . Hernia repair    . Gastric bypass    . Tubal ligation    . Abdominal clip      to stomach post gastric bypass  . Knee arthroplasty Left 09/13/2013    Procedure: LEFT COMPUTER ASSISTED TOTAL KNEE ARTHROPLASTY;  Surgeon: Eldred Manges, MD;  [facility];  Service: Orthopedics;  Laterality: Left;  Left Total Knee Arthroplasty, Computer Assist  . Joint replacement Right     hip  . Total knee arthroplasty Right 05/22/2014    dr Ophelia Charter  . Knee arthroplasty Right 05/23/2014    Procedure: COMPUTER ASSISTED TOTAL KNEE ARTHROPLASTY;  Surgeon: Eldred Manges, MD;  [facility];  Service: Orthopedics;  Laterality: Right;    There were no vitals taken for this visit.  Visit Diagnosis:  Knee stiffness, right  Edema of right lower extremity      Subjective Assessment - 07/01/14 1458    Symptoms Not bad.  Complains of "extreme weakness" in right hip   Currently in Pain? Yes   Pain Score 3    Pain Location Knee   Pain Orientation Right   Pain Onset  More than a month ago   Multiple Pain Sites No          [facility] PT Assessment - 07/01/14 0001    ROM / Strength   AROM / PROM / Strength AROM   AROM   AROM Assessment Site Knee   Right/Left Knee Right   Right Knee Extension 5   Right Knee Flexion 103                  [facility] Adult PT Treatment/Exercise - 07/01/14 0001    Exercises   Exercises Knee/Hip   Knee/Hip Exercises: Aerobic   Stationary Bike 6 minutes   Tread Mill 6 minutes  Nustep level 5   Knee/Hip Exercises: Machines for Strengthening   Cybex Knee Extension 5#  2x15   Cybex Knee Flexion 20#  2x15   Cryotherapy   Number Minutes Cryotherapy 15 Minutes   Cryotherapy Location Knee   Type of Cryotherapy Other (comment)  vaso medium pressure   Electrical Stimulation   Electrical Stimulation Location knee   Electrical Stimulation Parameters IFC   Electrical Stimulation Goals Pain  PT Education - 07/01/14 1536    Education Details HEP for hip weakness.  Theraband handout.  Advised to perform without theraband.   Person(s) Educated Patient   Methods Explanation;Handout   Comprehension Verbalized understanding          PT Short Term Goals - 07/01/14 1538    PT SHORT TERM GOAL #1   Title independent with initial HEP   Time 2   Period Weeks   Status Achieved           PT Long Term Goals - 07/01/14 1538    PT LONG TERM GOAL #1   Title independent with RICE   Time 8   Period Weeks   Status Achieved   PT LONG TERM GOAL #2   Title increase ROM of the right knee to 0-110 degrees flexion   Period Weeks   Status On-going   PT LONG TERM GOAL #3   Title walk without assistive device   Time 8   Period Weeks   Status On-going   PT LONG TERM GOAL #4   Title go up and down stairs step over step   Period Weeks   Status On-going   PT LONG TERM GOAL #5   Title increase strength of the right knee to 4/5   Time 8   Period Weeks   Status On-going               Plan  - 07/01/14 1536    Clinical Impression Statement Patient is unable to lift RLE due to hip weakness.  Definite use of UE's for hip flexion.   Pt will benefit from skilled therapeutic intervention in order to improve on the following deficits Abnormal gait;Decreased balance;Decreased mobility;Increased edema;Decreased range of motion;Decreased strength;Difficulty walking;Pain   Rehab Potential Good   PT Frequency 3x / week   PT Duration 8 weeks   PT Treatment/Interventions Ultrasound;Cryotherapy;Electrical Stimulation;Functional mobility training;Stair training;Gait training;Therapeutic exercise;Balance training;Neuromuscular re-education;Patient/family education;Passive range of motion;Scar mobilization;Manual techniques   PT Next Visit Plan Continue with ROM and Strength.   PT Home Exercise Plan continue with ther ex, add new HEP   Consulted and Agree with Plan of Care Patient        Problem List Patient Active Problem List   Diagnosis Date Noted  . Osteoarthritis of right knee 05/27/2014  . Total knee replacement status 05/23/2014  . Rheumatoid arthritis 09/24/2013  . Osteoarthritis of left knee 09/13/2013    Class: Diagnosis of    Tukker Byrns PTA 07/01/2014, 3:44 PM  [facility] [place] [address] [place], [place], [address] Phone: [phone]   Fax:  [phone]

## 2014-07-03 ENCOUNTER — Ambulatory Visit: Payer: BLUE CROSS/BLUE SHIELD | Admitting: Physical Therapy

## 2014-07-03 ENCOUNTER — Encounter: Payer: Self-pay | Admitting: Physical Therapy

## 2014-07-03 DIAGNOSIS — Z4789 Encounter for other orthopedic aftercare: Secondary | ICD-10-CM | POA: Diagnosis not present

## 2014-07-03 DIAGNOSIS — R600 Localized edema: Secondary | ICD-10-CM

## 2014-07-03 DIAGNOSIS — M25661 Stiffness of right knee, not elsewhere classified: Secondary | ICD-10-CM

## 2014-07-03 NOTE — Therapy (Signed)
[facility] [place] [facility] [address] [place], [place], [address] Phone: [phone]   Fax:  [phone]  Physical Therapy Treatment  Patient Details  Name: Chelsea Callahan MRN: [id number] Date of Birth: 1950/06/22 Referring Provider:  Eldred Manges, MD  Encounter Date: 07/03/2014      PT End of Session - 07/03/14 1430    Visit Number 4   Number of Visits 16   Date for PT Re-Evaluation 08/23/14   PT Start Time 1345   PT Stop Time 1447   PT Time Calculation (min) 62 min   Activity Tolerance Patient limited by pain;Patient limited by fatigue      Past Medical History  Diagnosis Date  . RA (rheumatoid arthritis)   . Anemia   . Vasculitis     on prednisone -   . PONV (postoperative nausea and vomiting)   . Spinal headache     c-section  . Raynaud disease   . Primary osteoarthritis of right knee     Past Surgical History  Procedure Laterality Date  . Cesarean section      x 2  . Hernia repair    . Gastric bypass    . Tubal ligation    . Abdominal clip      to stomach post gastric bypass  . Knee arthroplasty Left 09/13/2013    Procedure: LEFT COMPUTER ASSISTED TOTAL KNEE ARTHROPLASTY;  Surgeon: Eldred Manges, MD;  [facility];  Service: Orthopedics;  Laterality: Left;  Left Total Knee Arthroplasty, Computer Assist  . Joint replacement Right     hip  . Total knee arthroplasty Right 05/22/2014    dr Ophelia Charter  . Knee arthroplasty Right 05/23/2014    Procedure: COMPUTER ASSISTED TOTAL KNEE ARTHROPLASTY;  Surgeon: Eldred Manges, MD;  [facility];  Service: Orthopedics;  Laterality: Right;    There were no vitals taken for this visit.  Visit Diagnosis:  Knee stiffness, right  Edema of right lower extremity      Subjective Assessment - 07/03/14 1349    Symptoms I was pretty sore after last treatment   Pertinent History had left TKR May 2015, right THR 11 years ago   Limitations Standing;Walking;House hold activities   How long can you stand comfortably? 5 minutes   How long can you walk comfortably? 5 minutes   Patient Stated Goals walk and go up stairs normally without pain   Currently in Pain? Yes   Pain Score 2    Pain Location Knee   Pain Orientation Right   Pain Descriptors / Indicators Aching   Pain Type Acute pain   Pain Onset More than a month ago   Pain Frequency Intermittent   Aggravating Factors  being up on it and stretching increases the pain   Pain Relieving Factors ice and pain meds   Effect of Pain on Daily Activities mobility is limited and I have pain                    [facility] Adult PT Treatment/Exercise - 07/03/14 0001    Ambulation/Gait   Stair Management Technique Alternating pattern  only on the way up   Knee/Hip Exercises: Stretches   Knee: Self-Stretch to increase Flexion 30 seconds;2 reps   Gastroc Stretch 3 reps;20 seconds   Knee/Hip Exercises: Aerobic   Stationary Bike 6 minutes   Elliptical Nustep Level 5 x 6 minutes   Knee/Hip Exercises: Machines for Strengthening  Cybex Knee Extension 5# 2x15   Cybex Knee Flexion 25# 2x15   Knee/Hip Exercises: Standing   Other Standing Knee Exercises hip flexion 2#   Other Standing Knee Exercises sitting 2# hip flexion   Knee/Hip Exercises: Seated   Other Seated Knee Exercises leg press 20# 2x 15   Knee/Hip Exercises: Supine   Quad Sets Right;3 sets   Bridges Both;3 sets   Straight Leg Raises 3 sets;10 reps   Knee/Hip Exercises: Prone   Straight Leg Raises 2 sets;10 reps   Modalities   Modalities Electrical Stimulation   Cryotherapy   Number Minutes Cryotherapy 15 Minutes   Cryotherapy Location Knee   Type of Cryotherapy Ice pack   Electrical Stimulation   Electrical Stimulation Location knee   Electrical Stimulation Parameters IFC   Electrical Stimulation Goals Pain   Manual Therapy   Manual Therapy --  PROM of flexion                  PT Short Term Goals - 07/01/14 1538    PT SHORT TERM  GOAL #1   Title independent with initial HEP   Time 2   Period Weeks   Status Achieved           PT Long Term Goals - 07/01/14 1538    PT LONG TERM GOAL #1   Title independent with RICE   Time 8   Period Weeks   Status Achieved   PT LONG TERM GOAL #2   Title increase ROM of the right knee to 0-110 degrees flexion   Period Weeks   Status On-going   PT LONG TERM GOAL #3   Title walk without assistive device   Time 8   Period Weeks   Status On-going   PT LONG TERM GOAL #4   Title go up and down stairs step over step   Period Weeks   Status On-going   PT LONG TERM GOAL #5   Title increase strength of the right knee to 4/5   Time 8   Period Weeks   Status On-going               Plan - 07/03/14 1430    Clinical Impression Statement right hip seems to be the biggest issue, it is very weak and she is very sore   Pt will benefit from skilled therapeutic intervention in order to improve on the following deficits Abnormal gait;Decreased balance;Decreased mobility;Increased edema;Decreased range of motion;Decreased strength;Difficulty walking;Pain   Rehab Potential Good   PT Frequency 3x / week   PT Duration 8 weeks   PT Treatment/Interventions Ultrasound;Cryotherapy;Electrical Stimulation;Functional mobility training;Stair training;Gait training;Therapeutic exercise;Balance training;Neuromuscular re-education;Patient/family education;Passive range of motion;Scar mobilization;Manual techniques   PT Next Visit Plan Continue with ROM and Strength.  Add hip strength ex's   Consulted and Agree with Plan of Care Patient        Problem List Patient Active Problem List   Diagnosis Date Noted  . Osteoarthritis of right knee 05/27/2014  . Total knee replacement status 05/23/2014  . Rheumatoid arthritis 09/24/2013  . Osteoarthritis of left knee 09/13/2013    Class: Diagnosis of    Jearld Lesch, PT 07/03/2014, 2:32 PM  [facility] [place] [facility] [address] [place], [place], [address] Phone: [phone]   Fax:  [phone]

## 2014-07-07 ENCOUNTER — Encounter: Payer: Self-pay | Admitting: Physical Therapy

## 2014-07-07 ENCOUNTER — Ambulatory Visit: Payer: BLUE CROSS/BLUE SHIELD | Admitting: Physical Therapy

## 2014-07-07 DIAGNOSIS — M25661 Stiffness of right knee, not elsewhere classified: Secondary | ICD-10-CM

## 2014-07-07 DIAGNOSIS — R600 Localized edema: Secondary | ICD-10-CM

## 2014-07-07 DIAGNOSIS — Z4789 Encounter for other orthopedic aftercare: Secondary | ICD-10-CM | POA: Diagnosis not present

## 2014-07-07 NOTE — Therapy (Signed)
[facility] [place] [address] [place], [place], [address] Phone: [phone]   Fax:  [phone]  Physical Therapy Treatment  Patient Details  Name: Chelsea Callahan MRN: [id number] Date of Birth: 04-Jul-1950 Referring Provider:  Eldred Manges, MD  Encounter Date: 07/07/2014      PT End of Session - 07/07/14 0930    Visit Number 5   Date for PT Re-Evaluation 08/23/14   PT Start Time 0847   PT Stop Time 0945   PT Time Calculation (min) 58 min   Activity Tolerance Patient limited by pain;Patient limited by fatigue      Past Medical History  Diagnosis Date  . RA (rheumatoid arthritis)   . Anemia   . Vasculitis     on prednisone -   . PONV (postoperative nausea and vomiting)   . Spinal headache     c-section  . Raynaud disease   . Primary osteoarthritis of right knee     Past Surgical History  Procedure Laterality Date  . Cesarean section      x 2  . Hernia repair    . Gastric bypass    . Tubal ligation    . Abdominal clip      to stomach post gastric bypass  . Knee arthroplasty Left 09/13/2013    Procedure: LEFT COMPUTER ASSISTED TOTAL KNEE ARTHROPLASTY;  Surgeon: Eldred Manges, MD;  [facility];  Service: Orthopedics;  Laterality: Left;  Left Total Knee Arthroplasty, Computer Assist  . Joint replacement Right     hip  . Total knee arthroplasty Right 05/22/2014    dr Ophelia Charter  . Knee arthroplasty Right 05/23/2014    Procedure: COMPUTER ASSISTED TOTAL KNEE ARTHROPLASTY;  Surgeon: Eldred Manges, MD;  [facility]: [facility];  Service: Orthopedics;  Laterality: Right;    There were no vitals taken for this visit.  Visit Diagnosis:  Knee stiffness, right  Edema of right lower extremity      Subjective Assessment - 07/07/14 0848    Symptoms No meds and very little pain.   Pain Score 1    Pain Location Knee   Pain Orientation Right   Multiple Pain Sites Yes   Pain Score 10   Pain Location Hip   Pain Orientation Right    Pain Frequency Constant          [facility] PT Assessment - 07/07/14 0001    ROM / Strength   AROM / PROM / Strength AROM   AROM   AROM Assessment Site Knee   Right/Left Knee Right   Right Knee Extension 5   Right Knee Flexion 95                  [facility] Adult PT Treatment/Exercise - 07/07/14 0001    Exercises   Exercises Knee/Hip   Knee/Hip Exercises: Aerobic   Stationary Bike 6 minutes  rocking then full revolutions position 5   Elliptical Nustep Level 5 x 6 minutes   Knee/Hip Exercises: Seated   Stool Scoot - Round Trips 1  increased pain in left knee   Knee/Hip Exercises: Supine   Heel Slides 2 sets;15 reps;Limitations   Heel Slides Limitations friction reduction  abd/add   Other Supine Knee Exercises KTC with ball  2x15   Modalities   Modalities Electrical Stimulation;Cryotherapy;Moist Heat   Moist Heat Therapy   Number Minutes Moist Heat 15 Minutes   Moist Heat Location Other (comment)  right hip  Cryotherapy   Number Minutes Cryotherapy 15 Minutes   Cryotherapy Location Knee   Type of Cryotherapy Other (comment)  vaso   Electrical Stimulation   Electrical Stimulation Location knee/hip   Electrical Stimulation Parameters premod   Electrical Stimulation Goals Pain                  PT Short Term Goals - 07/01/14 1538    PT SHORT TERM GOAL #1   Title independent with initial HEP   Time 2   Period Weeks   Status Achieved           PT Long Term Goals - 07/01/14 1538    PT LONG TERM GOAL #1   Title independent with RICE   Time 8   Period Weeks   Status Achieved   PT LONG TERM GOAL #2   Title increase ROM of the right knee to 0-110 degrees flexion   Period Weeks   Status On-going   PT LONG TERM GOAL #3   Title walk without assistive device   Time 8   Period Weeks   Status On-going   PT LONG TERM GOAL #4   Title go up and down stairs step over step   Period Weeks   Status On-going   PT LONG TERM GOAL #5   Title increase  strength of the right knee to 4/5   Time 8   Period Weeks   Status On-going               Plan - 07/07/14 3329    Clinical Impression Statement Gait with SPC.  Weak right hip.  Complains of high pain with any motion.     Pt will benefit from skilled therapeutic intervention in order to improve on the following deficits Abnormal gait;Decreased balance;Decreased mobility;Increased edema;Decreased range of motion;Decreased strength;Difficulty walking;Pain   Rehab Potential Good   PT Frequency 3x / week   PT Duration 8 weeks   PT Treatment/Interventions Ultrasound;Cryotherapy;Electrical Stimulation;Functional mobility training;Stair training;Gait training;Therapeutic exercise;Balance training;Neuromuscular re-education;Patient/family education;Passive range of motion;Scar mobilization;Manual techniques   PT Next Visit Plan Continue with ROM and Strength.  Focus on hip to improve gait.   Consulted and Agree with Plan of Care Patient        Problem List Patient Active Problem List   Diagnosis Date Noted  . Osteoarthritis of right knee 05/27/2014  . Total knee replacement status 05/23/2014  . Rheumatoid arthritis 09/24/2013  . Osteoarthritis of left knee 09/13/2013    Class: Diagnosis of    Aarya Robinson PTA 07/07/2014, 9:31 AM  [facility] [place] [address] [place], [place], [address] Phone: [phone]   Fax:  [phone]

## 2014-07-10 ENCOUNTER — Ambulatory Visit: Payer: BLUE CROSS/BLUE SHIELD | Admitting: Physical Therapy

## 2014-07-10 ENCOUNTER — Encounter: Payer: Self-pay | Admitting: Physical Therapy

## 2014-07-10 DIAGNOSIS — Z4789 Encounter for other orthopedic aftercare: Secondary | ICD-10-CM | POA: Diagnosis not present

## 2014-07-10 DIAGNOSIS — R600 Localized edema: Secondary | ICD-10-CM

## 2014-07-10 DIAGNOSIS — M25661 Stiffness of right knee, not elsewhere classified: Secondary | ICD-10-CM

## 2014-07-10 NOTE — Therapy (Signed)
[facility] [place] [address] [place], [place], [address] Phone: [phone]   Fax:  [phone]  Physical Therapy Treatment  Patient Details  Name: Chelsea Callahan MRN: [id number] Date of Birth: 05-11-1950 Referring Provider:  Eldred Manges, MD  Encounter Date: 07/10/2014      PT End of Session - 07/10/14 1727    Visit Number 6   Number of Visits 16   Date for PT Re-Evaluation 08/23/14   PT Start Time 1628   PT Stop Time 1717   PT Time Calculation (min) 49 min   Activity Tolerance Patient limited by pain;Patient limited by fatigue      Past Medical History  Diagnosis Date  . RA (rheumatoid arthritis)   . Anemia   . Vasculitis     on prednisone -   . PONV (postoperative nausea and vomiting)   . Spinal headache     c-section  . Raynaud disease   . Primary osteoarthritis of right knee     Past Surgical History  Procedure Laterality Date  . Cesarean section      x 2  . Hernia repair    . Gastric bypass    . Tubal ligation    . Abdominal clip      to stomach post gastric bypass  . Knee arthroplasty Left 09/13/2013    Procedure: LEFT COMPUTER ASSISTED TOTAL KNEE ARTHROPLASTY;  Surgeon: Eldred Manges, MD;  [facility];  Service: Orthopedics;  Laterality: Left;  Left Total Knee Arthroplasty, Computer Assist  . Joint replacement Right     hip  . Total knee arthroplasty Right 05/22/2014    dr Ophelia Charter  . Knee arthroplasty Right 05/23/2014    Procedure: COMPUTER ASSISTED TOTAL KNEE ARTHROPLASTY;  Surgeon: Eldred Manges, MD;  [facility]: [facility];  Service: Orthopedics;  Laterality: Right;    There were no vitals filed for this visit.  Visit Diagnosis:  Knee stiffness, right  Edema of right lower extremity      Subjective Assessment - 07/10/14 1634    Symptoms Doing good.   Currently in Pain? Yes   Pain Score 1    Pain Location Knee   Pain Orientation Right   Multiple Pain Sites Yes   Pain Score 3   Pain  Location Hip   Pain Orientation Right                       [facility] Adult PT Treatment/Exercise - 07/10/14 0001    Exercises   Exercises Knee/Hip   Knee/Hip Exercises: Aerobic   Stationary Bike 6 minutes   Elliptical Nustep Level 6 x 6 minutes   Knee/Hip Exercises: Machines for Strengthening   Cybex Knee Extension 5# 2x15   Cybex Knee Flexion 25# 2x15   Modalities   Modalities Electrical Stimulation;Cryotherapy;Moist Heat   Moist Heat Therapy   Number Minutes Moist Heat 15 Minutes   Moist Heat Location Other (comment)  right hip   Cryotherapy   Number Minutes Cryotherapy 15 Minutes   Cryotherapy Location Knee   Type of Cryotherapy Other (comment)  vaso   Electrical Stimulation   Electrical Stimulation Location knee/hip   Electrical Stimulation Parameters premod   Electrical Stimulation Goals Pain                  PT Short Term Goals - 07/01/14 1538    PT SHORT TERM GOAL #1   Title independent with initial HEP  Time 2   Period Weeks   Status Achieved           PT Long Term Goals - 07/01/14 1538    PT LONG TERM GOAL #1   Title independent with RICE   Time 8   Period Weeks   Status Achieved   PT LONG TERM GOAL #2   Title increase ROM of the right knee to 0-110 degrees flexion   Period Weeks   Status On-going   PT LONG TERM GOAL #3   Title walk without assistive device   Time 8   Period Weeks   Status On-going   PT LONG TERM GOAL #4   Title go up and down stairs step over step   Period Weeks   Status On-going   PT LONG TERM GOAL #5   Title increase strength of the right knee to 4/5   Time 8   Period Weeks   Status On-going               Plan - 07/10/14 1653    Clinical Impression Statement Complains of pain in left knee during exercises.  Ambulated to machinery without assistive device today.   Pt will benefit from skilled therapeutic intervention in order to improve on the following deficits Abnormal gait;Decreased  balance;Decreased mobility;Increased edema;Decreased range of motion;Decreased strength;Difficulty walking;Pain   Rehab Potential Good   PT Frequency 3x / week   PT Duration 8 weeks   PT Treatment/Interventions Ultrasound;Cryotherapy;Electrical Stimulation;Functional mobility training;Stair training;Gait training;Therapeutic exercise;Balance training;Neuromuscular re-education;Patient/family education;Passive range of motion;Scar mobilization;Manual techniques   PT Next Visit Plan Continue with ROM and Strength.  Focus on hip to improve gait.   Consulted and Agree with Plan of Care Patient        Problem List Patient Active Problem List   Diagnosis Date Noted  . Osteoarthritis of right knee 05/27/2014  . Total knee replacement status 05/23/2014  . Rheumatoid arthritis 09/24/2013  . Osteoarthritis of left knee 09/13/2013    Class: Diagnosis of    Lenor Provencher PTA 07/10/2014, 5:28 PM  [facility] [place] [address] [place], [place], [address] Phone: [phone]   Fax:  [phone]

## 2014-07-15 ENCOUNTER — Encounter: Payer: Self-pay | Admitting: Physical Therapy

## 2014-07-15 ENCOUNTER — Ambulatory Visit: Payer: BLUE CROSS/BLUE SHIELD | Admitting: Physical Therapy

## 2014-07-15 DIAGNOSIS — M25661 Stiffness of right knee, not elsewhere classified: Secondary | ICD-10-CM

## 2014-07-15 DIAGNOSIS — R600 Localized edema: Secondary | ICD-10-CM

## 2014-07-15 DIAGNOSIS — Z4789 Encounter for other orthopedic aftercare: Secondary | ICD-10-CM | POA: Diagnosis not present

## 2014-07-15 NOTE — Therapy (Signed)
[facility] [place] [address] [place], [place], [address] Phone: [phone]   Fax:  [phone]  Physical Therapy Treatment  Patient Details  Name: Chelsea Callahan MRN: [id number] Date of Birth: May 02, 1951 Referring Provider:  Eldred Manges, MD  Encounter Date: 07/15/2014      PT End of Session - 07/15/14 1624    Visit Number 7   Number of Visits 16   Date for PT Re-Evaluation 08/23/14   PT Start Time 1530   PT Stop Time 1640   PT Time Calculation (min) 70 min   Activity Tolerance Patient limited by pain;Patient limited by fatigue   Behavior During Therapy [facility] for tasks assessed/performed      Past Medical History  Diagnosis Date  . RA (rheumatoid arthritis)   . Anemia   . Vasculitis     on prednisone -   . PONV (postoperative nausea and vomiting)   . Spinal headache     c-section  . Raynaud disease   . Primary osteoarthritis of right knee     Past Surgical History  Procedure Laterality Date  . Cesarean section      x 2  . Hernia repair    . Gastric bypass    . Tubal ligation    . Abdominal clip      to stomach post gastric bypass  . Knee arthroplasty Left 09/13/2013    Procedure: LEFT COMPUTER ASSISTED TOTAL KNEE ARTHROPLASTY;  Surgeon: Eldred Manges, MD;  [facility]: [facility] OR;  Service: Orthopedics;  Laterality: Left;  Left Total Knee Arthroplasty, Computer Assist  . Joint replacement Right     hip  . Total knee arthroplasty Right 05/22/2014    dr Ophelia Charter  . Knee arthroplasty Right 05/23/2014    Procedure: COMPUTER ASSISTED TOTAL KNEE ARTHROPLASTY;  Surgeon: Eldred Manges, MD;  [facility]: [facility] OR;  Service: Orthopedics;  Laterality: Right;    There were no vitals filed for this visit.  Visit Diagnosis:  No diagnosis found.      Subjective Assessment - 07/15/14 1537    Symptoms Better. Just weak.   Currently in Pain? Yes   Pain Score 1    Multiple Pain Sites No            [facility] PT Assessment - 07/15/14  0001    ROM / Strength   AROM / PROM / Strength AROM   AROM   AROM Assessment Site Knee   Right/Left Knee Right   Right Knee Extension 3   Right Knee Flexion 98                   [facility] Adult PT Treatment/Exercise - 07/15/14 0001    Exercises   Exercises Knee/Hip   Knee/Hip Exercises: Aerobic   Stationary Bike 6 minutes   Elliptical Nustep Level 6 x 6 minutes   Knee/Hip Exercises: Standing   Other Standing Knee Exercises right hip flexion  2x20   Knee/Hip Exercises: Seated   Stool Scoot - Round Trips 1   Knee/Hip Exercises: Supine   Straight Leg Raises 2 sets;5 reps   Straight Leg Raises Limitations place and hold   Other Supine Knee Exercises KTC with ball  2x20   Modalities   Modalities Electrical Stimulation;Moist Heat;Cryotherapy   Moist Heat Therapy   Number Minutes Moist Heat 20 Minutes   Moist Heat Location Other (comment)  right hip (anterior)   Cryotherapy   Number Minutes Cryotherapy 20 Minutes  Cryotherapy Location Knee   Type of Cryotherapy Other (comment)  vaso (med pressure)   Electrical Stimulation   Electrical Stimulation Location right anterior hip   Electrical Stimulation Parameters premod   Electrical Stimulation Goals Pain                  PT Short Term Goals - 07/01/14 1538    PT SHORT TERM GOAL #1   Title independent with initial HEP   Time 2   Period Weeks   Status Achieved           PT Long Term Goals - 07/01/14 1538    PT LONG TERM GOAL #1   Title independent with RICE   Time 8   Period Weeks   Status Achieved   PT LONG TERM GOAL #2   Title increase ROM of the right knee to 0-110 degrees flexion   Period Weeks   Status On-going   PT LONG TERM GOAL #3   Title walk without assistive device   Time 8   Period Weeks   Status On-going   PT LONG TERM GOAL #4   Title go up and down stairs step over step   Period Weeks   Status On-going   PT LONG TERM GOAL #5   Title increase strength of the right knee to  4/5   Time 8   Period Weeks   Status On-going               Problem List Patient Active Problem List   Diagnosis Date Noted  . Osteoarthritis of right knee 05/27/2014  . Total knee replacement status 05/23/2014  . Rheumatoid arthritis 09/24/2013  . Osteoarthritis of left knee 09/13/2013    Class: Diagnosis of    Gwyn Mehring PTA 07/15/2014, 4:26 PM  [facility] [place] [address] [place], [place], [address] Phone: [phone]   Fax:  [phone]

## 2014-07-17 ENCOUNTER — Ambulatory Visit: Payer: BLUE CROSS/BLUE SHIELD | Admitting: Physical Therapy

## 2014-07-22 ENCOUNTER — Ambulatory Visit: Payer: BLUE CROSS/BLUE SHIELD | Admitting: Physical Therapy

## 2014-07-22 ENCOUNTER — Encounter: Payer: Self-pay | Admitting: Physical Therapy

## 2014-07-22 DIAGNOSIS — M25661 Stiffness of right knee, not elsewhere classified: Secondary | ICD-10-CM

## 2014-07-22 DIAGNOSIS — R600 Localized edema: Secondary | ICD-10-CM

## 2014-07-22 DIAGNOSIS — Z4789 Encounter for other orthopedic aftercare: Secondary | ICD-10-CM | POA: Diagnosis not present

## 2014-07-22 NOTE — Therapy (Signed)
[facility] [place] [address] [place], [place], [address] Phone: [phone]   Fax:  [phone]  Physical Therapy Treatment  Patient Details  Name: Chelsea Callahan MRN: [id number] Date of Birth: [birth year] Referring Provider:  Eldred Manges, MD  Encounter Date: 07/22/2014      PT End of Session - 07/22/14 1132    Visit Number 8   Number of Visits 16   Date for PT Re-Evaluation 08/23/14   PT Start Time 1100   PT Stop Time 1132   PT Time Calculation (min) 32 min   Activity Tolerance Patient limited by fatigue   Behavior During Therapy [facility] for tasks assessed/performed      Past Medical History  Diagnosis Date  . RA (rheumatoid arthritis)   . Anemia   . Vasculitis     on prednisone -   . PONV (postoperative nausea and vomiting)   . Spinal headache     c-section  . Raynaud disease   . Primary osteoarthritis of right knee     Past Surgical History  Procedure Laterality Date  . Cesarean section      x 2  . Hernia repair    . Gastric bypass    . Tubal ligation    . Abdominal clip      to stomach post gastric bypass  . Knee arthroplasty Left 09/13/2013    Procedure: LEFT COMPUTER ASSISTED TOTAL KNEE ARTHROPLASTY;  Surgeon: Eldred Manges, MD;  [facility];  Service: Orthopedics;  Laterality: Left;  Left Total Knee Arthroplasty, Computer Assist  . Joint replacement Right     hip  . Total knee arthroplasty Right 05/22/2014    dr Ophelia Charter  . Knee arthroplasty Right 05/23/2014    Procedure: COMPUTER ASSISTED TOTAL KNEE ARTHROPLASTY;  Surgeon: Eldred Manges, MD;  [facility];  Service: Orthopedics;  Laterality: Right;    There were no vitals filed for this visit.  Visit Diagnosis:  Knee stiffness, right  Edema of right lower extremity      Subjective Assessment - 07/22/14 1104    Symptoms No pain.  I'm just not strong.   Currently in Pain? No/denies   Multiple Pain Sites No            [facility] PT  Assessment - 07/22/14 0001    ROM / Strength   AROM / PROM / Strength AROM   AROM   AROM Assessment Site Knee   Right/Left Knee Right   Right Knee Extension 2   Right Knee Flexion 103                   [facility] Adult PT Treatment/Exercise - 07/22/14 0001    Exercises   Exercises Knee/Hip   Knee/Hip Exercises: Aerobic   Stationary Bike 6 minutes   Elliptical Nustep Level 6 x 6 minutes   Knee/Hip Exercises: Supine   Straight Leg Raises 2 sets;5 reps  first set place and hold   Straight Leg Raises Limitations place and hold  unable to lift first set   Other Supine Knee Exercises KTC with ball 2x15   Other Supine Knee Exercises abd/add with ball 2x15                  PT Short Term Goals - 07/01/14 1538    PT SHORT TERM GOAL #1   Title independent with initial HEP   Time 2   Period Weeks   Status Achieved  PT Long Term Goals - 07/01/14 1538    PT LONG TERM GOAL #1   Title independent with RICE   Time 8   Period Weeks   Status Achieved   PT LONG TERM GOAL #2   Title increase ROM of the right knee to 0-110 degrees flexion   Period Weeks   Status On-going   PT LONG TERM GOAL #3   Title walk without assistive device   Time 8   Period Weeks   Status On-going   PT LONG TERM GOAL #4   Title go up and down stairs step over step   Period Weeks   Status On-going   PT LONG TERM GOAL #5   Title increase strength of the right knee to 4/5   Time 8   Period Weeks   Status On-going               Plan - 07/22/14 1132    Clinical Impression Statement Increased ROM. Patient was able to lift RLE from mat table actively today.   Pt will benefit from skilled therapeutic intervention in order to improve on the following deficits Abnormal gait;Decreased balance;Decreased mobility;Increased edema;Decreased range of motion;Decreased strength;Difficulty walking;Pain   Rehab Potential Good   PT Frequency 3x / week   PT Duration 8 weeks   PT  Treatment/Interventions Ultrasound;Cryotherapy;Electrical Stimulation;Functional mobility training;Stair training;Gait training;Therapeutic exercise;Balance training;Neuromuscular re-education;Patient/family education;Passive range of motion;Scar mobilization;Manual techniques   PT Next Visit Plan Continue with ROM and Strength.  Focus on hip to improve gait.   Consulted and Agree with Plan of Care Patient        Problem List Patient Active Problem List   Diagnosis Date Noted  . Osteoarthritis of right knee 05/27/2014  . Total knee replacement status 05/23/2014  . Rheumatoid arthritis 09/24/2013  . Osteoarthritis of left knee 09/13/2013    Class: Diagnosis of    Aletha Allebach PTA 07/22/2014, 11:36 AM  [facility] [place] [address] [place], [place], [address] Phone: [phone]   Fax:  [phone]

## 2014-07-24 ENCOUNTER — Encounter: Payer: Self-pay | Admitting: Physical Therapy

## 2014-07-24 ENCOUNTER — Ambulatory Visit: Payer: BLUE CROSS/BLUE SHIELD | Admitting: Physical Therapy

## 2014-07-24 DIAGNOSIS — Z4789 Encounter for other orthopedic aftercare: Secondary | ICD-10-CM | POA: Diagnosis not present

## 2014-07-24 DIAGNOSIS — R600 Localized edema: Secondary | ICD-10-CM

## 2014-07-24 DIAGNOSIS — M25661 Stiffness of right knee, not elsewhere classified: Secondary | ICD-10-CM

## 2014-07-24 NOTE — Therapy (Signed)
[facility] [place] [address] [place], [place], [address] Phone: [phone]   Fax:  [phone]  Physical Therapy Treatment  Patient Details  Name: Chelsea Callahan MRN: [id number] Date of Birth: [birth year] Referring Provider:  Eldred Manges, MD  Encounter Date: 07/24/2014      PT End of Session - 07/24/14 1144    Visit Number 9   Number of Visits 16   Date for PT Re-Evaluation 08/23/14   PT Start Time 1100   PT Stop Time 1142   PT Time Calculation (min) 42 min   Activity Tolerance Patient limited by fatigue   Behavior During Therapy [facility] for tasks assessed/performed      Past Medical History  Diagnosis Date  . RA (rheumatoid arthritis)   . Anemia   . Vasculitis     on prednisone -   . PONV (postoperative nausea and vomiting)   . Spinal headache     c-section  . Raynaud disease   . Primary osteoarthritis of right knee     Past Surgical History  Procedure Laterality Date  . Cesarean section      x 2  . Hernia repair    . Gastric bypass    . Tubal ligation    . Abdominal clip      to stomach post gastric bypass  . Knee arthroplasty Left 09/13/2013    Procedure: LEFT COMPUTER ASSISTED TOTAL KNEE ARTHROPLASTY;  Surgeon: Eldred Manges, MD;  [facility]: [facility];  Service: Orthopedics;  Laterality: Left;  Left Total Knee Arthroplasty, Computer Assist  . Joint replacement Right     hip  . Total knee arthroplasty Right 05/22/2014    dr Ophelia Charter  . Knee arthroplasty Right 05/23/2014    Procedure: COMPUTER ASSISTED TOTAL KNEE ARTHROPLASTY;  Surgeon: Eldred Manges, MD;  Location: [facility] OR;  Service: Orthopedics;  Laterality: Right;    There were no vitals filed for this visit.  Visit Diagnosis:  Knee stiffness, right  Edema of right lower extremity      Subjective Assessment - 07/24/14 1106    Symptoms I'm hanging in there.   Currently in Pain? No/denies   Multiple Pain Sites No            [facility] PT Assessment -  07/24/14 0001    ROM / Strength   AROM / PROM / Strength Strength   Strength   Strength Assessment Site Knee   Right/Left Knee Right   Right Knee Flexion 3+/5   Right Knee Extension 3+/5                   [facility] Adult PT Treatment/Exercise - 07/24/14 0001    Exercises   Exercises Knee/Hip   Knee/Hip Exercises: Aerobic   Stationary Bike 6 minutes   Elliptical Nustep Level 6 x 6 minutes   Knee/Hip Exercises: Machines for Strengthening   Cybex Knee Extension 10# 2x10   Cybex Knee Flexion 25# 2x15   Knee/Hip Exercises: Seated   Other Seated Knee Exercises abd/add stool scoot x15   Knee/Hip Exercises: Supine   Other Supine Knee Exercises KTC with ball 2x15   Other Supine Knee Exercises abd/add with ball x15                  PT Short Term Goals - 07/01/14 1538    PT SHORT TERM GOAL #1   Title independent with initial HEP   Time 2   Period Weeks  Status Achieved           PT Long Term Goals - 07/01/14 1538    PT LONG TERM GOAL #1   Title independent with RICE   Time 8   Period Weeks   Status Achieved   PT LONG TERM GOAL #2   Title increase ROM of the right knee to 0-110 degrees flexion   Period Weeks   Status On-going   PT LONG TERM GOAL #3   Title walk without assistive device   Time 8   Period Weeks   Status On-going   PT LONG TERM GOAL #4   Title go up and down stairs step over step   Period Weeks   Status On-going   PT LONG TERM GOAL #5   Title increase strength of the right knee to 4/5   Time 8   Period Weeks   Status On-going               Plan - 07/24/14 1139    Clinical Impression Statement Strength is improving in right hip.  Lack of strength is still major contributor to gait deficits.        Problem List Patient Active Problem List   Diagnosis Date Noted  . Osteoarthritis of right knee 05/27/2014  . Total knee replacement status 05/23/2014  . Rheumatoid arthritis 09/24/2013  . Osteoarthritis of left knee  09/13/2013    Class: Diagnosis of    Rylen Swindler PTA 07/24/2014, 11:46 AM  [facility] [place] [address] [place], [place], [address] Phone: [phone]   Fax:  [phone]

## 2014-07-28 ENCOUNTER — Ambulatory Visit: Payer: BLUE CROSS/BLUE SHIELD | Admitting: Physical Therapy

## 2014-07-28 ENCOUNTER — Encounter: Payer: Self-pay | Admitting: Physical Therapy

## 2014-07-28 DIAGNOSIS — Z4789 Encounter for other orthopedic aftercare: Secondary | ICD-10-CM | POA: Diagnosis not present

## 2014-07-28 DIAGNOSIS — M25661 Stiffness of right knee, not elsewhere classified: Secondary | ICD-10-CM

## 2014-07-28 DIAGNOSIS — R600 Localized edema: Secondary | ICD-10-CM

## 2014-07-28 NOTE — Therapy (Signed)
[facility] [place] [address] [place], [place], [address] Phone: [phone]   Fax:  [phone]  Physical Therapy Treatment  Patient Details  Name: Chelsea Callahan MRN: [id number] Date of Birth: [birth year] Referring Provider:  Eldred Manges, MD  Encounter Date: 07/28/2014      PT End of Session - 07/28/14 1701    Visit Number 10   Number of Visits 16   Date for PT Re-Evaluation 08/23/14   PT Start Time 1615   PT Stop Time 1704   PT Time Calculation (min) 49 min   Activity Tolerance Patient tolerated treatment well;Patient limited by fatigue   Behavior During Therapy [facility] for tasks assessed/performed      Past Medical History  Diagnosis Date  . RA (rheumatoid arthritis)   . Anemia   . Vasculitis     on prednisone -   . PONV (postoperative nausea and vomiting)   . Spinal headache     c-section  . Raynaud disease   . Primary osteoarthritis of right knee     Past Surgical History  Procedure Laterality Date  . Cesarean section      x 2  . Hernia repair    . Gastric bypass    . Tubal ligation    . Abdominal clip      to stomach post gastric bypass  . Knee arthroplasty Left 09/13/2013    Procedure: LEFT COMPUTER ASSISTED TOTAL KNEE ARTHROPLASTY;  Surgeon: Eldred Manges, MD;  Location: [facility] OR;  Service: Orthopedics;  Laterality: Left;  Left Total Knee Arthroplasty, Computer Assist  . Joint replacement Right     hip  . Total knee arthroplasty Right 05/22/2014    dr Ophelia Charter  . Knee arthroplasty Right 05/23/2014    Procedure: COMPUTER ASSISTED TOTAL KNEE ARTHROPLASTY;  Surgeon: Eldred Manges, MD;  [facility]: [facility] OR;  Service: Orthopedics;  Laterality: Right;    There were no vitals filed for this visit.  Visit Diagnosis:  No diagnosis found.      Subjective Assessment - 07/28/14 1634    Symptoms getting better   Currently in Pain? No/denies   Multiple Pain Sites No                       [facility] Adult PT  Treatment/Exercise - 07/28/14 0001    Exercises   Exercises Knee/Hip   Knee/Hip Exercises: Aerobic   Stationary Bike 6 minutes   Elliptical Nustep Level 6 x 6 minutes   Knee/Hip Exercises: Standing   Other Standing Knee Exercises hamstring curls 2x10   Knee/Hip Exercises: Seated   Other Seated Knee Exercises abd/add stool scoot x15   Other Seated Knee Exercises hip flex 2x15   Knee/Hip Exercises: Supine   Other Supine Knee Exercises KTC with ball 2x15   Knee/Hip Exercises: Machines for Strengthening   Cybex Leg Press 20# 3x10                  PT Short Term Goals - 07/01/14 1538    PT SHORT TERM GOAL #1   Title independent with initial HEP   Time 2   Period Weeks   Status Achieved           PT Long Term Goals - 07/01/14 1538    PT LONG TERM GOAL #1   Title independent with RICE   Time 8   Period Weeks   Status Achieved   PT LONG TERM  GOAL #2   Title increase ROM of the right knee to 0-110 degrees flexion   Period Weeks   Status On-going   PT LONG TERM GOAL #3   Title walk without assistive device   Time 8   Period Weeks   Status On-going   PT LONG TERM GOAL #4   Title go up and down stairs step over step   Period Weeks   Status On-going   PT LONG TERM GOAL #5   Title increase strength of the right knee to 4/5   Time 8   Period Weeks   Status On-going               Problem List Patient Active Problem List   Diagnosis Date Noted  . Osteoarthritis of right knee 05/27/2014  . Total knee replacement status 05/23/2014  . Rheumatoid arthritis 09/24/2013  . Osteoarthritis of left knee 09/13/2013    Class: Diagnosis of    Latrisha Coiro PTA 07/28/2014, 5:07 PM  [facility] [place] [address] [place], [place], [address] Phone: [phone]   Fax:  [phone]

## 2014-08-01 ENCOUNTER — Encounter: Payer: Self-pay | Admitting: Physical Therapy

## 2014-08-01 ENCOUNTER — Ambulatory Visit: Payer: BLUE CROSS/BLUE SHIELD | Attending: Orthopaedic Surgery | Admitting: Physical Therapy

## 2014-08-01 DIAGNOSIS — Z96651 Presence of right artificial knee joint: Secondary | ICD-10-CM | POA: Insufficient documentation

## 2014-08-01 DIAGNOSIS — R609 Edema, unspecified: Secondary | ICD-10-CM | POA: Diagnosis not present

## 2014-08-01 DIAGNOSIS — Z4789 Encounter for other orthopedic aftercare: Secondary | ICD-10-CM | POA: Insufficient documentation

## 2014-08-01 DIAGNOSIS — M25661 Stiffness of right knee, not elsewhere classified: Secondary | ICD-10-CM | POA: Insufficient documentation

## 2014-08-01 DIAGNOSIS — R600 Localized edema: Secondary | ICD-10-CM

## 2014-08-01 NOTE — Therapy (Signed)
[facility] [place] [address] [place], [place], [address] Phone: [phone]   Fax:  [phone]  Physical Therapy Treatment  Patient Details  Name: Chelsea Callahan MRN: [id number] Date of Birth: 28-Jul-1950 Referring Provider:  Eldred Manges, MD  Encounter Date: 08/01/2014      PT End of Session - 08/01/14 1054    Visit Number 11   Number of Visits 16   Date for PT Re-Evaluation 08/23/14   PT Start Time 1015   PT Stop Time 1055   PT Time Calculation (min) 40 min   Activity Tolerance Patient tolerated treatment well   Behavior During Therapy Chelsea Callahan for tasks assessed/performed      Past Medical History  Diagnosis Date  . RA (rheumatoid arthritis)   . Anemia   . Vasculitis     on prednisone -   . PONV (postoperative nausea and vomiting)   . Spinal headache     c-section  . Raynaud disease   . Primary osteoarthritis of right knee     Past Surgical History  Procedure Laterality Date  . Cesarean section      x 2  . Hernia repair    . Gastric bypass    . Tubal ligation    . Abdominal clip      to stomach post gastric bypass  . Knee arthroplasty Left 09/13/2013    Procedure: LEFT COMPUTER ASSISTED TOTAL KNEE ARTHROPLASTY;  Surgeon: Chelsea Manges, MD;  [facility]: [facility];  Service: Orthopedics;  Laterality: Left;  Left Total Knee Arthroplasty, Computer Assist  . Joint replacement Right     hip  . Total knee arthroplasty Right 05/22/2014    dr Ophelia Charter  . Knee arthroplasty Right 05/23/2014    Procedure: COMPUTER ASSISTED TOTAL KNEE ARTHROPLASTY;  Surgeon: Chelsea Manges, MD;  Location: [facility];  Service: Orthopedics;  Laterality: Right;    There were no vitals filed for this visit.  Visit Diagnosis:  Knee stiffness, right  Edema of right lower extremity      Subjective Assessment - 08/01/14 1021    Symptoms No pain, just stiff.   Currently in Pain? No/denies            [facility] PT Assessment - 08/01/14 0001    ROM /  Strength   AROM / PROM / Strength AROM;Strength   AROM   AROM Assessment Site Knee   Right/Left Knee Right   Right Knee Extension 1   Right Knee Flexion 105   Strength   Strength Assessment Site Knee   Right/Left Knee Right   Right Knee Flexion 4+/5   Right Knee Extension 4+/5                   Chelsea Callahan - 08/01/14 0001    Exercises   Exercises Knee/Hip   Knee/Hip Exercises: Aerobic   Stationary Bike 6 minutes   Elliptical Nustep Level 6 x 6 minutes   Knee/Hip Exercises: Standing   Other Standing Knee Exercises hip flex,ext,and abd with green theraband  2x15   Knee/Hip Exercises: Seated   Other Seated Knee Exercises hip abd green theraband 2x15                PT Education - 08/01/14 1057    Education provided Yes   Education Details Patient given hip exercise handout and green theraband.   Person(s) Educated Patient   Methods Explanation;Demonstration   Comprehension Verbalized understanding;Returned demonstration  PT Short Term Goals - 07/01/14 1538    PT SHORT TERM GOAL #1   Title independent with initial HEP   Time 2   Period Weeks   Status Achieved           PT Long Term Goals - 08/01/14 1055    PT LONG TERM GOAL #1   Title independent with RICE   Time 8   Period Weeks   Status Achieved   PT LONG TERM GOAL #2   Title increase ROM of the right knee to 0-110 degrees flexion   Time 8   Period Weeks   Status On-going   PT LONG TERM GOAL #3   Title walk without assistive device   Time 8   Period Weeks   Status On-going   PT LONG TERM GOAL #4   Title go up and down stairs step over step   Time 8   Period Weeks   Status On-going   PT LONG TERM GOAL #5   Title increase strength of the right knee to 4/5   Time 8   Period Weeks   Status Achieved               Plan - 08/01/14 1056    Clinical Impression Statement Patient ambulated to therapy without assistive device today.  Reports  manuevering stairs reciprocating.  Increased ROM and strength.   Pt will benefit from skilled therapeutic intervention in order to improve on the following deficits Abnormal gait;Decreased balance;Decreased mobility;Increased edema;Decreased range of motion;Decreased strength;Difficulty walking;Pain   Rehab Potential Good   PT Frequency 3x / week   PT Duration 8 weeks   PT Treatment/Interventions Ultrasound;Cryotherapy;Electrical Stimulation;Functional mobility training;Stair training;Gait training;Therapeutic exercise;Balance training;Neuromuscular re-education;Patient/family education;Passive range of motion;Scar mobilization;Manual techniques   PT Next Visit Plan Continue with ROM and Strength.  Focus on hip to improve gait.   Consulted and Agree with Plan of Care Patient        Problem List Patient Active Problem List   Diagnosis Date Noted  . Osteoarthritis of right knee 05/27/2014  . Total knee replacement status 05/23/2014  . Rheumatoid arthritis 09/24/2013  . Osteoarthritis of left knee 09/13/2013    Class: Diagnosis of    Chelsea Callahan PTA 08/01/2014, 10:59 AM  [facility] [place] [address] [place], [place], [address] Phone: [phone]   Fax:  [phone]

## 2014-08-05 ENCOUNTER — Ambulatory Visit: Payer: BLUE CROSS/BLUE SHIELD | Admitting: Physical Therapy

## 2014-08-05 ENCOUNTER — Encounter: Payer: Self-pay | Admitting: Physical Therapy

## 2014-08-05 DIAGNOSIS — R600 Localized edema: Secondary | ICD-10-CM

## 2014-08-05 DIAGNOSIS — Z4789 Encounter for other orthopedic aftercare: Secondary | ICD-10-CM | POA: Diagnosis not present

## 2014-08-05 DIAGNOSIS — M25661 Stiffness of right knee, not elsewhere classified: Secondary | ICD-10-CM

## 2014-08-05 NOTE — Therapy (Signed)
[facility] [address] [place], [place], [address] Phone: [phone]   Fax:  [phone]  Physical Therapy Treatment  Patient Details  Name: Chelsea Callahan MRN: [id number] Date of Birth: 09-Jul-1950 Referring Provider:  Eldred Manges, MD  Encounter Date: 08/05/2014      PT End of Session - 08/05/14 1133    Visit Number 12   Number of Visits 16   Date for PT Re-Evaluation 08/23/14   PT Start Time 1057   PT Stop Time 1133   PT Time Calculation (min) 36 min   Activity Tolerance Patient tolerated treatment well   Behavior During Therapy [facility] for tasks assessed/performed      Past Medical History  Diagnosis Date  . RA (rheumatoid arthritis)   . Anemia   . Vasculitis     on prednisone -   . PONV (postoperative nausea and vomiting)   . Spinal headache     c-section  . Raynaud disease   . Primary osteoarthritis of right knee     Past Surgical History  Procedure Laterality Date  . Cesarean section      x 2  . Hernia repair    . Gastric bypass    . Tubal ligation    . Abdominal clip      to stomach post gastric bypass  . Knee arthroplasty Left 09/13/2013    Procedure: LEFT COMPUTER ASSISTED TOTAL KNEE ARTHROPLASTY;  Surgeon: Eldred Manges, MD;  [facility];  Service: Orthopedics;  Laterality: Left;  Left Total Knee Arthroplasty, Computer Assist  . Joint replacement Right     hip  . Total knee arthroplasty Right 05/22/2014    dr Ophelia Charter  . Knee arthroplasty Right 05/23/2014    Procedure: COMPUTER ASSISTED TOTAL KNEE ARTHROPLASTY;  Surgeon: Eldred Manges, MD;  [facility];  Service: Orthopedics;  Laterality: Right;    There were no vitals filed for this visit.  Visit Diagnosis:  No diagnosis found.      Subjective Assessment - 08/05/14 1055    Subjective No pain, just stiff.   Currently in Pain? No/denies   Multiple Pain Sites No            [facility] PT Assessment - 08/05/14 0001    ROM / Strength    AROM / PROM / Strength AROM   AROM   AROM Assessment Site Knee   Right/Left Knee Right   Right Knee Extension 1   Right Knee Flexion 105                   [facility] Adult PT Treatment/Exercise - 08/05/14 0001    Exercises   Exercises Knee/Hip   Knee/Hip Exercises: Aerobic   Stationary Bike 8 minutes   Elliptical Nustep Level 6 x 8 minutes   Knee/Hip Exercises: Standing   Walking with Sports Cord 30# 5 reps fwd then bk   Knee/Hip Exercises: Supine   Other Supine Knee Exercises KTC with ball 2x30                  PT Short Term Goals - 07/01/14 1538    PT SHORT TERM GOAL #1   Title independent with initial HEP   Time 2   Period Weeks   Status Achieved           PT Long Term Goals - 08/01/14 1055    PT LONG TERM GOAL #1   Title independent with RICE  Time 8   Period Weeks   Status Achieved   PT LONG TERM GOAL #2   Title increase ROM of the right knee to 0-110 degrees flexion   Time 8   Period Weeks   Status On-going   PT LONG TERM GOAL #3   Title walk without assistive device   Time 8   Period Weeks   Status On-going   PT LONG TERM GOAL #4   Title go up and down stairs step over step   Time 8   Period Weeks   Status On-going   PT LONG TERM GOAL #5   Title increase strength of the right knee to 4/5   Time 8   Period Weeks   Status Achieved               Problem List Patient Active Problem List   Diagnosis Date Noted  . Osteoarthritis of right knee 05/27/2014  . Total knee replacement status 05/23/2014  . Rheumatoid arthritis 09/24/2013  . Osteoarthritis of left knee 09/13/2013    Class: Diagnosis of    Luan Maberry PTA 08/05/2014, 11:35 AM  [facility] [address] [place], [place], [address] Phone: [phone]   Fax:  [phone]

## 2014-08-08 ENCOUNTER — Ambulatory Visit: Payer: BLUE CROSS/BLUE SHIELD | Admitting: Physical Therapy

## 2014-08-08 ENCOUNTER — Encounter: Payer: Self-pay | Admitting: Physical Therapy

## 2014-08-08 DIAGNOSIS — Z4789 Encounter for other orthopedic aftercare: Secondary | ICD-10-CM | POA: Diagnosis not present

## 2014-08-08 DIAGNOSIS — M25661 Stiffness of right knee, not elsewhere classified: Secondary | ICD-10-CM

## 2014-08-08 NOTE — Therapy (Signed)
[facility] [address] [place], [place], [address] Phone: [phone]   Fax:  [phone]  Physical Therapy Treatment  Patient Details  Name: Chelsea Callahan MRN: [id number] Date of Birth: [birth year] Referring Provider:  Eldred Manges, MD  Encounter Date: 08/08/2014      PT End of Session - 08/08/14 1134    Visit Number 13   Number of Visits 16   PT Start Time 1055   PT Stop Time 1145   PT Time Calculation (min) 50 min      Past Medical History  Diagnosis Date  . RA (rheumatoid arthritis)   . Anemia   . Vasculitis     on prednisone -   . PONV (postoperative nausea and vomiting)   . Spinal headache     c-section  . Raynaud disease   . Primary osteoarthritis of right knee     Past Surgical History  Procedure Laterality Date  . Cesarean section      x 2  . Hernia repair    . Gastric bypass    . Tubal ligation    . Abdominal clip      to stomach post gastric bypass  . Knee arthroplasty Left 09/13/2013    Procedure: LEFT COMPUTER ASSISTED TOTAL KNEE ARTHROPLASTY;  Surgeon: Eldred Manges, MD;  [facility];  Service: Orthopedics;  Laterality: Left;  Left Total Knee Arthroplasty, Computer Assist  . Joint replacement Right     hip  . Total knee arthroplasty Right 05/22/2014    dr Ophelia Charter  . Knee arthroplasty Right 05/23/2014    Procedure: COMPUTER ASSISTED TOTAL KNEE ARTHROPLASTY;  Surgeon: Eldred Manges, MD;  [facility];  Service: Orthopedics;  Laterality: Right;    There were no vitals filed for this visit.  Visit Diagnosis:  Knee stiffness, right      Subjective Assessment - 08/08/14 1113    Subjective feeling pretty good   Currently in Pain? No/denies                       [facility] Adult PT Treatment/Exercise - 08/08/14 0001    Knee/Hip Exercises: Aerobic   Stationary Bike 8 minutes   Elliptical Nustep Level 6 x 8 minutes   Knee/Hip Exercises: Standing   Walking with Sports Cord  30# 5 reps fwd then bk   Other Standing Knee Exercises hip flex,ext,and abd with green theraband  2x15   Knee/Hip Exercises: Seated   Stool Scoot - Round Trips 2   Other Seated Knee Exercises hip abd green theraband 2x15   Other Seated Knee Exercises hip flex 2x15   Knee/Hip Exercises: Supine   Quad Sets Right;3 sets  green tband   Other Supine Knee Exercises KTC with ball 2x30                PT Education - 08/08/14 1133    Education provided Yes   Education Details educated on muscles of LE and strengthening ther ex   Person(s) Educated Patient   Methods Explanation   Comprehension Verbalized understanding          PT Short Term Goals - 07/01/14 1538    PT SHORT TERM GOAL #1   Title independent with initial HEP   Time 2   Period Weeks   Status Achieved           PT Long Term Goals - 08/01/14 1055    PT  LONG TERM GOAL #1   Title independent with RICE   Time 8   Period Weeks   Status Achieved   PT LONG TERM GOAL #2   Title increase ROM of the right knee to 0-110 degrees flexion   Time 8   Period Weeks   Status On-going   PT LONG TERM GOAL #3   Title walk without assistive device   Time 8   Period Weeks   Status On-going   PT LONG TERM GOAL #4   Title go up and down stairs step over step   Time 8   Period Weeks   Status On-going   PT LONG TERM GOAL #5   Title increase strength of the right knee to 4/5   Time 8   Period Weeks   Status Achieved               Plan - 08/08/14 1134    Clinical Impression Statement amb without AD, no c/o knee pain, c/o more of muscle in quad/hip   PT Next Visit Plan Continue with ROM and Strength.  Focus on hip to improve gait. WRITE MD NOTE        Problem List Patient Active Problem List   Diagnosis Date Noted  . Osteoarthritis of right knee 05/27/2014  . Total knee replacement status 05/23/2014  . Rheumatoid arthritis 09/24/2013  . Osteoarthritis of left knee 09/13/2013    Class: Diagnosis of     PAYSEUR,ANGIE PTA 08/08/2014, 11:38 AM  [facility] [address] [place], [place], [address] Phone: [phone]   Fax:  [phone]

## 2014-08-11 ENCOUNTER — Ambulatory Visit: Payer: BLUE CROSS/BLUE SHIELD | Admitting: Physical Therapy

## 2014-08-11 ENCOUNTER — Encounter: Payer: Self-pay | Admitting: Physical Therapy

## 2014-08-11 DIAGNOSIS — M25661 Stiffness of right knee, not elsewhere classified: Secondary | ICD-10-CM

## 2014-08-11 DIAGNOSIS — Z4789 Encounter for other orthopedic aftercare: Secondary | ICD-10-CM | POA: Diagnosis not present

## 2014-08-11 DIAGNOSIS — R600 Localized edema: Secondary | ICD-10-CM

## 2014-08-11 NOTE — Therapy (Signed)
[place], [place], [address] Phone: [phone]   Fax:  [phone]  Physical Therapy Treatment  Patient Details  Name: Chelsea Callahan MRN: [id number] Date of Birth: 29-Apr-1951 Referring Provider:  Marybelle Killings, MD  Encounter Date: 08/11/2014      PT End of Session - 08/11/14 1105    Visit Number 14   Date for PT Re-Evaluation 08/23/14   PT Start Time 1014   PT Stop Time 1110   PT Time Calculation (min) 56 min      Past Medical History  Diagnosis Date  . RA (rheumatoid arthritis)   . Anemia   . Vasculitis     on prednisone -   . PONV (postoperative nausea and vomiting)   . Spinal headache     c-section  . Raynaud disease   . Primary osteoarthritis of right knee     Past Surgical History  Procedure Laterality Date  . Cesarean section      x 2  . Hernia repair    . Gastric bypass    . Tubal ligation    . Abdominal clip      to stomach post gastric bypass  . Knee arthroplasty Left 09/13/2013    Procedure: LEFT COMPUTER ASSISTED TOTAL KNEE ARTHROPLASTY;  Surgeon: Marybelle Killings, MD;  Location: [place];  Service: Orthopedics;  Laterality: Left;  Left Total Knee Arthroplasty, Computer Assist  . Joint replacement Right     hip  . Total knee arthroplasty Right 05/22/2014    dr Lorin Mercy  . Knee arthroplasty Right 05/23/2014    Procedure: COMPUTER ASSISTED TOTAL KNEE ARTHROPLASTY;  Surgeon: Marybelle Killings, MD;  Location: [place];  Service: Orthopedics;  Laterality: Right;    There were no vitals filed for this visit.  Visit Diagnosis:  Knee stiffness, right - Plan: PT plan of care cert/re-cert  Edema of right lower extremity - Plan: PT plan of care cert/re-cert      Subjective Assessment - 08/11/14 1016    Subjective Doing okay.  At night the pain is a 5-6/10.   Pertinent History had left TKR May 2015, right THR 11 years ago   Patient Stated Goals walk and go up stairs normally without pain    Currently in Pain? Yes   Pain Score 2    Pain Location Knee   Pain Orientation Right   Pain Descriptors / Indicators Tightness   Pain Type Acute pain   Pain Onset More than a month ago   Pain Frequency Intermittent   Aggravating Factors  being on feet increases the stiffness   Pain Relieving Factors rest and ice   Effect of Pain on Daily Activities limits my mobility and ability to do ADL's            [facility] PT Assessment - 08/11/14 0001    AROM   AROM Assessment Site Knee   Right/Left Knee Right   Right Knee Extension 5   Right Knee Flexion 110                   [facility] Adult PT Treatment/Exercise - 08/11/14 0001    Knee/Hip Exercises: Stretches   Passive Hamstring Stretch 2 reps;60 seconds   Hip Flexor Stretch 20 seconds;4 reps   Knee/Hip Exercises: Aerobic   Stationary Bike 8 minutes   Elliptical Nustep Level 7 x 8 minutes   Knee/Hip Exercises: Machines for Strengthening   Cybex Knee  Flexion 25# 2x15   Knee/Hip Exercises: Standing   Functional Squat Limitations green Tband squats    Walking with Sports Cord 40# 5 reps fwd then bk   Other Standing Knee Exercises hip flex,ext,and abd with green theraband   Knee/Hip Exercises: Seated   Other Seated Knee Exercises hip abd green theraband 2x15   Other Seated Knee Exercises hip flex 2x15   Knee/Hip Exercises: Supine   Straight Leg Raises 2 sets;5 reps   Straight Leg Raises Limitations place and hold                  PT Short Term Goals - 07/01/14 1538    PT SHORT TERM GOAL #1   Title independent with initial HEP   Time 2   Period Weeks   Status Achieved           PT Long Term Goals - 08/11/14 1106    PT LONG TERM GOAL #2   Title increase ROM of the right knee to 0-110 degrees flexion   Status Partially Met   PT LONG TERM GOAL #3   Title walk without assistive device   Status Partially Met   PT LONG TERM GOAL #4   Title go up and down stairs step over step   Status Partially Met                Plan - 08/11/14 1105    Clinical Impression Statement Has right hip flexor tightness and pain with any active SLR   Pt will benefit from skilled therapeutic intervention in order to improve on the following deficits Abnormal gait;Decreased balance;Decreased mobility;Increased edema;Decreased range of motion;Decreased strength;Difficulty walking;Pain   Rehab Potential Good   PT Frequency 2x / week   PT Duration 4 weeks   PT Treatment/Interventions Ultrasound;Cryotherapy;Electrical Stimulation;Functional mobility training;Stair training;Gait training;Therapeutic exercise;Balance training;Neuromuscular re-education;Patient/family education;Passive range of motion;Scar mobilization;Manual techniques   PT Next Visit Plan Work on ROM and function al gait   Consulted and Agree with Plan of Care Patient        Problem List Patient Active Problem List   Diagnosis Date Noted  . Osteoarthritis of right knee 05/27/2014  . Total knee replacement status 05/23/2014  . Rheumatoid arthritis 09/24/2013  . Osteoarthritis of left knee 09/13/2013    Class: Diagnosis of    Sumner Boast, PT 08/11/2014, 11:08 AM  [place] [address] [place], [place], [address] Phone: [phone]   Fax:  [phone]

## 2014-08-14 ENCOUNTER — Encounter: Payer: Self-pay | Admitting: Physical Therapy

## 2014-08-14 ENCOUNTER — Ambulatory Visit: Payer: BLUE CROSS/BLUE SHIELD | Admitting: Physical Therapy

## 2014-08-14 DIAGNOSIS — M25661 Stiffness of right knee, not elsewhere classified: Secondary | ICD-10-CM

## 2014-08-14 DIAGNOSIS — Z4789 Encounter for other orthopedic aftercare: Secondary | ICD-10-CM | POA: Diagnosis not present

## 2014-08-14 NOTE — Therapy (Signed)
[place], [place], [address] Phone: [phone]   Fax:  [phone]  Physical Therapy Treatment  Patient Details  Name: Chelsea Callahan MRN: [id number] Date of Birth: 1951/01/05 Referring Provider:  Marybelle Killings, MD  Encounter Date: 08/14/2014      PT End of Session - 08/14/14 1058    Visit Number 15   Number of Visits 24   Date for PT Re-Evaluation 09/22/14   PT Start Time 8546   PT Stop Time 1110   PT Time Calculation (min) 55 min      Past Medical History  Diagnosis Date  . RA (rheumatoid arthritis)   . Anemia   . Vasculitis     on prednisone -   . PONV (postoperative nausea and vomiting)   . Spinal headache     c-section  . Raynaud disease   . Primary osteoarthritis of right knee     Past Surgical History  Procedure Laterality Date  . Cesarean section      x 2  . Hernia repair    . Gastric bypass    . Tubal ligation    . Abdominal clip      to stomach post gastric bypass  . Knee arthroplasty Left 09/13/2013    Procedure: LEFT COMPUTER ASSISTED TOTAL KNEE ARTHROPLASTY;  Surgeon: Marybelle Killings, MD;  Location: [place];  Service: Orthopedics;  Laterality: Left;  Left Total Knee Arthroplasty, Computer Assist  . Joint replacement Right     hip  . Total knee arthroplasty Right 05/22/2014    dr Lorin Mercy  . Knee arthroplasty Right 05/23/2014    Procedure: COMPUTER ASSISTED TOTAL KNEE ARTHROPLASTY;  Surgeon: Marybelle Killings, MD;  Location: [place];  Service: Orthopedics;  Laterality: Right;    There were no vitals filed for this visit.  Visit Diagnosis:  Knee stiffness, right      Subjective Assessment - 08/14/14 1030    Subjective new MD note ( scanned) iliosoas inflammation/? tendonitis. " if it was not for my hip my knee would be fine"   Currently in Pain? Yes   Pain Score 2    Pain Location Knee   Pain Orientation Right                       [facility] Adult PT  Treatment/Exercise - 08/14/14 0001    Knee/Hip Exercises: Aerobic   Stationary Bike 8 minutes   Elliptical Nustep Level 7 x 8 minutes   Knee/Hip Exercises: Standing   Other Standing Knee Exercises blue tband 2 sets 10 hip flex,ext and bad bilaterally on airex   Modalities   Modalities Ultrasound;Iontophoresis   Ultrasound   Ultrasound Location RT ant hip/groin  with RT hip flexors on a stretch   Ultrasound Parameters 1.8 mHz 1w/cm2   Ultrasound Goals Pain   Iontophoresis   Type of Iontophoresis Dexamethasone   Location RT ant hip   Dose 1.2 cc   Time 4 hour leave on patch   Manual Therapy   Manual Therapy --  pressure with [place] head to faciliate STW and stretch with [place]                  PT Short Term Goals - 07/01/14 1538    PT SHORT TERM GOAL #1   Title independent with initial HEP   Time 2   Period Weeks   Status Achieved  PT Long Term Goals - 08/11/14 1106    PT LONG TERM GOAL #2   Title increase ROM of the right knee to 0-110 degrees flexion   Status Partially Met   PT LONG TERM GOAL #3   Title walk without assistive device   Status Partially Met   PT LONG TERM GOAL #4   Title go up and down stairs step over step   Status Partially Met     Will add Iontophoresis to address the hip issue          Plan - 08/14/14 1100    Clinical Impression Statement pt with new diagnosis and script for RT hip tendonitis, added [place] on stretch and ionto ( discussed and okayed by PT M. Riki Sheer)   PT Next Visit Plan assess how modalities worked        Problem List Patient Active Problem List   Diagnosis Date Noted  . Osteoarthritis of right knee 05/27/2014  . Total knee replacement status 05/23/2014  . Rheumatoid arthritis 09/24/2013  . Osteoarthritis of left knee 09/13/2013    Class: Diagnosis of    PAYSEUR,ANGIE PTA 08/14/2014, 11:02 AM  [place] [place] [address] [place], [place], [address] Phone: [phone]   Fax:  [phone]

## 2014-08-18 ENCOUNTER — Ambulatory Visit: Payer: BLUE CROSS/BLUE SHIELD

## 2014-08-20 ENCOUNTER — Ambulatory Visit: Payer: BLUE CROSS/BLUE SHIELD | Admitting: Physical Therapy

## 2014-08-20 DIAGNOSIS — Z4789 Encounter for other orthopedic aftercare: Secondary | ICD-10-CM | POA: Diagnosis not present

## 2014-08-20 DIAGNOSIS — M25661 Stiffness of right knee, not elsewhere classified: Secondary | ICD-10-CM

## 2014-08-20 DIAGNOSIS — R600 Localized edema: Secondary | ICD-10-CM

## 2014-08-20 NOTE — Therapy (Signed)
[place], [place], [address] Phone: [phone]   Fax:  [phone]  Physical Therapy Treatment  Patient Details  Name: Chelsea Callahan MRN: [id number] Date of Birth: 12-12-1950 Referring Provider:  Marybelle Killings, MD  Encounter Date: 08/20/2014      PT End of Session - 08/20/14 1149    Visit Number 16   Number of Visits 24   Date for PT Re-Evaluation 09/22/14   PT Start Time 1100   PT Stop Time 1148   PT Time Calculation (min) 48 min   Activity Tolerance Patient tolerated treatment well   Behavior During Therapy [facility] for tasks assessed/performed      Past Medical History  Diagnosis Date  . RA (rheumatoid arthritis)   . Anemia   . Vasculitis     on prednisone -   . PONV (postoperative nausea and vomiting)   . Spinal headache     c-section  . Raynaud disease   . Primary osteoarthritis of right knee     Past Surgical History  Procedure Laterality Date  . Cesarean section      x 2  . Hernia repair    . Gastric bypass    . Tubal ligation    . Abdominal clip      to stomach post gastric bypass  . Knee arthroplasty Left 09/13/2013    Procedure: LEFT COMPUTER ASSISTED TOTAL KNEE ARTHROPLASTY;  Surgeon: Marybelle Killings, MD;  Location: [place];  Service: Orthopedics;  Laterality: Left;  Left Total Knee Arthroplasty, Computer Assist  . Joint replacement Right     hip  . Total knee arthroplasty Right 05/22/2014    dr Lorin Mercy  . Knee arthroplasty Right 05/23/2014    Procedure: COMPUTER ASSISTED TOTAL KNEE ARTHROPLASTY;  Surgeon: Marybelle Killings, MD;  Location: [place];  Service: Orthopedics;  Laterality: Right;    There were no vitals filed for this visit.  Visit Diagnosis:  Knee stiffness, right  Edema of right lower extremity      Subjective Assessment - 08/20/14 1103    Subjective R hip feels better; still stiff though.  R knee just feels stiff and swollen   Pertinent History had left TKR May  2015, right THR 11 years ago   Currently in Pain? No/denies                         [facility] Adult PT Treatment/Exercise - 08/20/14 1105    Knee/Hip Exercises: Stretches   Passive Hamstring Stretch 3 reps;30 seconds   Passive Hamstring Stretch Limitations supine with strap   Hip Flexor Stretch 3 reps;30 seconds   Hip Flexor Stretch Limitations supine on mat   Knee/Hip Exercises: Aerobic   Stationary Bike 8 minutes   Elliptical Nustep Level 7 x 8 minutes   Knee/Hip Exercises: Machines for Strengthening   Cybex Knee Extension 20# 3x10   Cybex Knee Flexion 25# 3x10   Knee/Hip Exercises: Standing   Walking with Sports Cord 40# 10 reps fwd then bk   Iontophoresis   Type of Iontophoresis Dexamethasone   Location RT ant hip   Dose 1.5 cc   Time 4 hour leave on patch                  PT Short Term Goals - 07/01/14 1538    PT SHORT TERM GOAL #1   Title independent with initial HEP   Time  2   Period Weeks   Status Achieved           PT Long Term Goals - 08/11/14 1106    PT LONG TERM GOAL #2   Title increase ROM of the right knee to 0-110 degrees flexion   Status Partially Met   PT LONG TERM GOAL #3   Title walk without assistive device   Status Partially Met   PT LONG TERM GOAL #4   Title go up and down stairs step over step   Status Partially Met               Plan - 08/20/14 1149    Clinical Impression Statement R hip feels much better following last session.  Will cont to benefit from PT to maximize function and mobility.   PT Next Visit Plan assess response to ionto only; swelling management and strengthening   Consulted and Agree with Plan of Care Patient        Problem List Patient Active Problem List   Diagnosis Date Noted  . Osteoarthritis of right knee 05/27/2014  . Total knee replacement status 05/23/2014  . Rheumatoid arthritis 09/24/2013  . Osteoarthritis of left knee 09/13/2013    Class: Diagnosis of   Laureen Abrahams, PT, DPT 08/20/2014 11:51 AM  [place] [address] [place], [place], [address] Phone: [phone]   Fax:  [phone]

## 2014-08-21 ENCOUNTER — Ambulatory Visit: Payer: BLUE CROSS/BLUE SHIELD | Admitting: Physical Therapy

## 2014-08-26 ENCOUNTER — Ambulatory Visit: Payer: BLUE CROSS/BLUE SHIELD | Admitting: Physical Therapy

## 2014-08-26 ENCOUNTER — Encounter: Payer: Self-pay | Admitting: Physical Therapy

## 2014-08-26 DIAGNOSIS — Z4789 Encounter for other orthopedic aftercare: Secondary | ICD-10-CM | POA: Diagnosis not present

## 2014-08-26 DIAGNOSIS — M25661 Stiffness of right knee, not elsewhere classified: Secondary | ICD-10-CM

## 2014-08-26 NOTE — Therapy (Signed)
[place], [place], [address] Phone: [phone]   Fax:  [phone]  Physical Therapy Treatment  Patient Details  Name: Chelsea Callahan MRN: [id number] Date of Birth: 08-24-1950 Referring Provider:  Marybelle Killings, MD  Encounter Date: 08/26/2014      PT End of Session - 08/26/14 1108    Visit Number 17   PT Start Time 2951   PT Stop Time 1120   PT Time Calculation (min) 63 min      Past Medical History  Diagnosis Date  . RA (rheumatoid arthritis)   . Anemia   . Vasculitis     on prednisone -   . PONV (postoperative nausea and vomiting)   . Spinal headache     c-section  . Raynaud disease   . Primary osteoarthritis of right knee     Past Surgical History  Procedure Laterality Date  . Cesarean section      x 2  . Hernia repair    . Gastric bypass    . Tubal ligation    . Abdominal clip      to stomach post gastric bypass  . Knee arthroplasty Left 09/13/2013    Procedure: LEFT COMPUTER ASSISTED TOTAL KNEE ARTHROPLASTY;  Surgeon: Marybelle Killings, MD;  Location: [place];  Service: Orthopedics;  Laterality: Left;  Left Total Knee Arthroplasty, Computer Assist  . Joint replacement Right     hip  . Total knee arthroplasty Right 05/22/2014    dr Lorin Mercy  . Knee arthroplasty Right 05/23/2014    Procedure: COMPUTER ASSISTED TOTAL KNEE ARTHROPLASTY;  Surgeon: Marybelle Killings, MD;  Location: [place];  Service: Orthopedics;  Laterality: Right;    There were no vitals filed for this visit.  Visit Diagnosis:  Knee stiffness, right      Subjective Assessment - 08/26/14 1019    Subjective Rt hip is better, some painfree motion, ionto is great. Knee is just stiff, no pain,working out at gym   Currently in Pain? No/denies                         [facility] Adult PT Treatment/Exercise - 08/26/14 0001    Knee/Hip Exercises: Aerobic   Stationary Bike 8 minutes   Elliptical Nustep Level 7 x 8  minutes   Knee/Hip Exercises: Machines for Strengthening   Cybex Knee Extension 20# 3x10   Cybex Knee Flexion 25# 3x10   Iontophoresis   Type of Iontophoresis Dexamethasone   Location RT ant hip   Dose 1.5 cc   Time 4 hour leave on patch   Manual Therapy   Manual Therapy Myofascial release  PROM with myofascial release to RT hip supine and SL   Myofascial Release Deep STW to quad and hip flexor on stretch                  PT Short Term Goals - 07/01/14 1538    PT SHORT TERM GOAL #1   Title independent with initial HEP   Time 2   Period Weeks   Status Achieved           PT Long Term Goals - 08/11/14 1106    PT LONG TERM GOAL #2   Title increase ROM of the right knee to 0-110 degrees flexion   Status Partially Met   PT LONG TERM GOAL #3   Title walk without assistive device  Status Partially Met   PT LONG TERM GOAL #4   Title go up and down stairs step over step   Status Partially Met               Plan - 08/26/14 1109    Clinical Impression Statement pt tolerated STW and stretches well, tight but looser with decreased pain after,improved gait and function with ex after stretches. pt getting good relief with ionto patches   PT Next Visit Plan assess goals        Problem List Patient Active Problem List   Diagnosis Date Noted  . Osteoarthritis of right knee 05/27/2014  . Total knee replacement status 05/23/2014  . Rheumatoid arthritis 09/24/2013  . Osteoarthritis of left knee 09/13/2013    Class: Diagnosis of    Larita Deremer,ANGIE PTA 08/26/2014, 11:12 AM  [place] [address] [place], [place], [address] Phone: [phone]   Fax:  [phone]

## 2014-08-29 ENCOUNTER — Ambulatory Visit: Payer: BLUE CROSS/BLUE SHIELD | Admitting: Physical Therapy

## 2014-08-29 ENCOUNTER — Encounter: Payer: Self-pay | Admitting: Physical Therapy

## 2014-08-29 DIAGNOSIS — Z4789 Encounter for other orthopedic aftercare: Secondary | ICD-10-CM | POA: Diagnosis not present

## 2014-08-29 DIAGNOSIS — M25661 Stiffness of right knee, not elsewhere classified: Secondary | ICD-10-CM

## 2014-08-29 NOTE — Therapy (Signed)
[facility] [place] [address] [place], [place], [address] Phone: [phone]   Fax:  [phone]  Physical Therapy Treatment  Patient Details  Name: Chelsea Callahan MRN: [id number] Date of Birth: 08/02/1950 Referring Provider:  Eldred Manges, MD  Encounter Date: 08/29/2014      PT End of Session - 08/29/14 1144    Visit Number 18   PT Start Time 1056   PT Stop Time 1150   PT Time Calculation (min) 54 min      Past Medical History  Diagnosis Date  . RA (rheumatoid arthritis)   . Anemia   . Vasculitis     on prednisone -   . PONV (postoperative nausea and vomiting)   . Spinal headache     c-section  . Raynaud disease   . Primary osteoarthritis of right knee     Past Surgical History  Procedure Laterality Date  . Cesarean section      x 2  . Hernia repair    . Gastric bypass    . Tubal ligation    . Abdominal clip      to stomach post gastric bypass  . Knee arthroplasty Left 09/13/2013    Procedure: LEFT COMPUTER ASSISTED TOTAL KNEE ARTHROPLASTY;  Surgeon: Eldred Manges, MD;  [facility];  Service: Orthopedics;  Laterality: Left;  Left Total Knee Arthroplasty, Computer Assist  . Joint replacement Right     hip  . Total knee arthroplasty Right 05/22/2014    dr Ophelia Charter  . Knee arthroplasty Right 05/23/2014    Procedure: COMPUTER ASSISTED TOTAL KNEE ARTHROPLASTY;  Surgeon: Eldred Manges, MD;  [facility];  Service: Orthopedics;  Laterality: Right;    There were no vitals filed for this visit.  Visit Diagnosis:  Knee stiffness, right      Subjective Assessment - 08/29/14 1058    Subjective knee is just so stiff, hip is 50% better. Gym is going well   Pain Score 4    Pain Location Hip   Pain Orientation Right            [facility] PT Assessment - 08/29/14 0001    AROM   AROM Assessment Site Knee   Right/Left Knee Right   Right Knee Extension 3   Right Knee Flexion 115                      [facility] Adult PT Treatment/Exercise - 08/29/14 0001    Knee/Hip Exercises: Aerobic   Stationary Bike 8 minutes   Elliptical Nustep Level 7 x 8 minutes   Iontophoresis   Type of Iontophoresis Dexamethasone   Location RT ant hip   Dose 1.5 cc   Time 4 hour leave on patch   Manual Therapy   Manual Therapy Myofascial release  PROM with myofascial release to RT hip supine and SL   Myofascial Release Deep STW to quad and hip flexor on stretch                  PT Short Term Goals - 07/01/14 1538    PT SHORT TERM GOAL #1   Title independent with initial HEP   Time 2   Period Weeks   Status Achieved           PT Long Term Goals - 08/29/14 1120    PT LONG TERM GOAL #2   Baseline 3-115  Plan - 08/29/14 1145    Clinical Impression Statement pt making good improvements in decreased pain in T hip and increased hip and knee ROM, improvements in ADLs and fucn at home including ym        Problem List Patient Active Problem List   Diagnosis Date Noted  . Osteoarthritis of right knee 05/27/2014  . Total knee replacement status 05/23/2014  . Rheumatoid arthritis 09/24/2013  . Osteoarthritis of left knee 09/13/2013    Class: Diagnosis of    PAYSEUR,ANGIE PTA 08/29/2014, 11:46 AM  [facility] [place] [address] [place], [place], [address] Phone: [phone]   Fax:  [phone]

## 2014-09-02 ENCOUNTER — Encounter: Payer: Self-pay | Admitting: Physical Therapy

## 2014-09-02 ENCOUNTER — Ambulatory Visit: Payer: BLUE CROSS/BLUE SHIELD | Attending: Orthopaedic Surgery | Admitting: Physical Therapy

## 2014-09-02 DIAGNOSIS — Z4789 Encounter for other orthopedic aftercare: Secondary | ICD-10-CM | POA: Insufficient documentation

## 2014-09-02 DIAGNOSIS — R609 Edema, unspecified: Secondary | ICD-10-CM | POA: Diagnosis not present

## 2014-09-02 DIAGNOSIS — M25661 Stiffness of right knee, not elsewhere classified: Secondary | ICD-10-CM | POA: Insufficient documentation

## 2014-09-02 DIAGNOSIS — Z96651 Presence of right artificial knee joint: Secondary | ICD-10-CM | POA: Diagnosis not present

## 2014-09-02 NOTE — Therapy (Signed)
[facility] [place] [address] [place], [place], [address] Phone: [phone]   Fax:  [phone]  Physical Therapy Treatment  Patient Details  Name: Chelsea Callahan MRN: [id number] Date of Birth: 1951/02/22 Referring Provider:  Eldred Manges, MD  Encounter Date: 09/02/2014      PT End of Session - 09/02/14 1337    Visit Number 19   PT Start Time 1145   PT Stop Time 1235   PT Time Calculation (min) 50 min      Past Medical History  Diagnosis Date  . RA (rheumatoid arthritis)   . Anemia   . Vasculitis     on prednisone -   . PONV (postoperative nausea and vomiting)   . Spinal headache     c-section  . Raynaud disease   . Primary osteoarthritis of right knee     Past Surgical History  Procedure Laterality Date  . Cesarean section      x 2  . Hernia repair    . Gastric bypass    . Tubal ligation    . Abdominal clip      to stomach post gastric bypass  . Knee arthroplasty Left 09/13/2013    Procedure: LEFT COMPUTER ASSISTED TOTAL KNEE ARTHROPLASTY;  Surgeon: Eldred Manges, MD;  [facility];  Service: Orthopedics;  Laterality: Left;  Left Total Knee Arthroplasty, Computer Assist  . Joint replacement Right     hip  . Total knee arthroplasty Right 05/22/2014    dr Ophelia Charter  . Knee arthroplasty Right 05/23/2014    Procedure: COMPUTER ASSISTED TOTAL KNEE ARTHROPLASTY;  Surgeon: Eldred Manges, MD;  [facility];  Service: Orthopedics;  Laterality: Right;    There were no vitals filed for this visit.  Visit Diagnosis:  Knee stiffness, right      Subjective Assessment - 09/02/14 1152    Subjective I am able to do more things with my hip.  I feel like there is more movement.  The knee is still stiff.  Pt. states she is exercising and stretching at the gym but cannot get the deep muscles like in PT.    Currently in Pain? No/denies   Pain Score 0-No pain                         [facility] Adult PT  Treatment/Exercise - 09/02/14 0001    Knee/Hip Exercises: Aerobic   Stationary Bike 8 minutes   Elliptical Nustep Level 7 x 8 minutes   Knee/Hip Exercises: Machines for Strengthening   Cybex Leg Press 20# 3x10   Knee/Hip Exercises: Plyometrics   Other Plyometric Exercises flex and ext and marching with red theraband 2x10 bilaterally p/t had pain with flex and marching  abd green theraband 3x10   Iontophoresis   Type of Iontophoresis Dexamethasone   Location RT ant hip   Dose 1.5 cc   Time 4 hour leave on patch   Manual Therapy   Manual Therapy Myofascial release  PROM with myofascial release to RT hip supine and SL   Myofascial Release Deep STW to quad and hip flexor on stretch                  PT Short Term Goals - 07/01/14 1538    PT SHORT TERM GOAL #1   Title independent with initial HEP   Time 2   Period Weeks   Status Achieved  PT Long Term Goals - 08/29/14 1120    PT LONG TERM GOAL #2   Baseline 3-115               Plan - 09/02/14 1338    Clinical Impression Statement with passive stretches, the hip flexors loosen but with active exercise the pt. still experiences pain.    PT Next Visit Plan assess goals and g-code        Problem List Patient Active Problem List   Diagnosis Date Noted  . Osteoarthritis of right knee 05/27/2014  . Total knee replacement status 05/23/2014  . Rheumatoid arthritis 09/24/2013  . Osteoarthritis of left knee 09/13/2013    Class: Diagnosis of   Paysuer, Chrissie Noa 09/02/2014, 1:45 PM  [facility] [place] [address] [place], [place], [address] Phone: [phone]   Fax:  [phone]

## 2014-09-05 ENCOUNTER — Encounter: Payer: Self-pay | Admitting: Physical Therapy

## 2014-09-05 ENCOUNTER — Ambulatory Visit: Payer: BLUE CROSS/BLUE SHIELD | Admitting: Physical Therapy

## 2014-09-05 DIAGNOSIS — Z4789 Encounter for other orthopedic aftercare: Secondary | ICD-10-CM | POA: Diagnosis not present

## 2014-09-05 DIAGNOSIS — M25661 Stiffness of right knee, not elsewhere classified: Secondary | ICD-10-CM

## 2014-09-05 NOTE — Therapy (Signed)
[place], [place], [address] Phone: [phone]   Fax:  [phone]  Physical Therapy Treatment  Patient Details  Name: Chelsea Callahan MRN: [id number] Date of Birth: [birth year] Referring Provider:  Marybelle Killings, MD  Encounter Date: 09/05/2014      PT End of Session - 09/05/14 1110    PT Start Time 1013   PT Stop Time 1112   PT Time Calculation (min) 59 min      Past Medical History  Diagnosis Date  . RA (rheumatoid arthritis)   . Anemia   . Vasculitis     on prednisone -   . PONV (postoperative nausea and vomiting)   . Spinal headache     c-section  . Raynaud disease   . Primary osteoarthritis of right knee     Past Surgical History  Procedure Laterality Date  . Cesarean section      x 2  . Hernia repair    . Gastric bypass    . Tubal ligation    . Abdominal clip      to stomach post gastric bypass  . Knee arthroplasty Left 09/13/2013    Procedure: LEFT COMPUTER ASSISTED TOTAL KNEE ARTHROPLASTY;  Surgeon: Marybelle Killings, MD;  Location: [place];  Service: Orthopedics;  Laterality: Left;  Left Total Knee Arthroplasty, Computer Assist  . Joint replacement Right     hip  . Total knee arthroplasty Right 05/22/2014    dr Lorin Mercy  . Knee arthroplasty Right 05/23/2014    Procedure: COMPUTER ASSISTED TOTAL KNEE ARTHROPLASTY;  Surgeon: Marybelle Killings, MD;  Location: [facility];  Service: Orthopedics;  Laterality: Right;    There were no vitals filed for this visit.  Visit Diagnosis:  Knee stiffness, right      Subjective Assessment - 09/05/14 1031    Subjective I am doing good today.  Hip and knee are both stiff today.  The gym is still going well.  Ionto patches are helping decrease the pain in the hip flexors.  The hip is feeling 50% better.                          [place] Adult PT Treatment/Exercise - 09/05/14 0001    Knee/Hip Exercises: Aerobic   Stationary Bike 8 minutes   Knee/Hip Exercises: Seated   Other Seated Knee Exercises active seated marching on exercise ball 2x10   Knee/Hip Exercises: Supine   Other Supine Knee Exercises KTC, bridges, and rotations with ball 2x10    Manual Therapy   Manual Therapy Myofascial release  PROM with myofascial release to RT hip supine and SL   Myofascial Release Deep STW to quad and hip flexor on stretch                  PT Short Term Goals - 07/01/14 1538    PT SHORT TERM GOAL #1   Title independent with initial HEP   Time 2   Period Weeks   Status Achieved           PT Long Term Goals - 09/05/14 1108    PT LONG TERM GOAL #1   Title independent with RICE   Status Achieved   PT LONG TERM GOAL #2   Title increase ROM of the right knee to 0-110 degrees flexion   Status Achieved   PT LONG TERM GOAL #3   Title walk without  assistive device   Status Achieved   PT LONG TERM GOAL #4   Title go up and down stairs step over step   Status Achieved   PT LONG TERM GOAL #5   Title increase strength of the right knee to 4/5   Status Achieved               Plan - 09/05/14 1103    Clinical Impression Statement pt. is still having pain with active exercise in the hip flexors.  Passive stretches have helped.  All of her goals have been been met.     PT Next Visit Plan GCODE DONE. SEE NEW GOALS ADDED        Problem List Patient Active Problem List   Diagnosis Date Noted  . Osteoarthritis of right knee 05/27/2014  . Total knee replacement status 05/23/2014  . Rheumatoid arthritis 09/24/2013  . Osteoarthritis of left knee 09/13/2013    Class: Diagnosis of   Angie Payseur PTA Oneal Grout SPTA 09/05/2014, 11:11 AM  [place], [place], [address] Phone: [phone]   Fax:  [phone]

## 2014-09-10 ENCOUNTER — Ambulatory Visit: Payer: BLUE CROSS/BLUE SHIELD | Admitting: Physical Therapy

## 2014-09-10 ENCOUNTER — Encounter: Payer: Self-pay | Admitting: Physical Therapy

## 2014-09-10 DIAGNOSIS — Z4789 Encounter for other orthopedic aftercare: Secondary | ICD-10-CM | POA: Diagnosis not present

## 2014-09-10 DIAGNOSIS — R600 Localized edema: Secondary | ICD-10-CM

## 2014-09-10 DIAGNOSIS — M25661 Stiffness of right knee, not elsewhere classified: Secondary | ICD-10-CM

## 2014-09-10 NOTE — Therapy (Signed)
[facility] [place] [address] [place], [place], [address] Phone: [phone]   Fax:  [phone]  Physical Therapy Treatment  Patient Details  Name: Chelsea Callahan MRN: [id number] Date of Birth: 1950/12/04 Referring Provider:  Eldred Manges, MD  Encounter Date: 09/10/2014      PT End of Session - 09/10/14 1647    Visit Number 21   Date for PT Re-Evaluation 09/22/14   PT Start Time 1614   PT Stop Time 1715   PT Time Calculation (min) 61 min      Past Medical History  Diagnosis Date  . RA (rheumatoid arthritis)   . Anemia   . Vasculitis     on prednisone -   . PONV (postoperative nausea and vomiting)   . Spinal headache     c-section  . Raynaud disease   . Primary osteoarthritis of right knee     Past Surgical History  Procedure Laterality Date  . Cesarean section      x 2  . Hernia repair    . Gastric bypass    . Tubal ligation    . Abdominal clip      to stomach post gastric bypass  . Knee arthroplasty Left 09/13/2013    Procedure: LEFT COMPUTER ASSISTED TOTAL KNEE ARTHROPLASTY;  Surgeon: Eldred Manges, MD;  [facility];  Service: Orthopedics;  Laterality: Left;  Left Total Knee Arthroplasty, Computer Assist  . Joint replacement Right     hip  . Total knee arthroplasty Right 05/22/2014    dr Ophelia Charter  . Knee arthroplasty Right 05/23/2014    Procedure: COMPUTER ASSISTED TOTAL KNEE ARTHROPLASTY;  Surgeon: Eldred Manges, MD;  [facility];  Service: Orthopedics;  Laterality: Right;    There were no vitals filed for this visit.  Visit Diagnosis:  Knee stiffness, right  Edema of right lower extremity      Subjective Assessment - 09/10/14 1625    Subjective Some pain in the anterior right hip, difficulty with raising right hip up   Currently in Pain? Yes   Pain Score 1    Pain Location Hip   Pain Orientation Right   Pain Descriptors / Indicators Tightness                          [facility] Adult PT Treatment/Exercise - 09/10/14 0001    Knee/Hip Exercises: Stretches   Passive Hamstring Stretch 30 seconds;3 reps   Hip Flexor Stretch 3 reps;30 seconds   Knee/Hip Exercises: Aerobic   Elliptical Elliptical R=5, I =7 x 3 minutes with 1 rest   Isokinetic 5# pulley, hip abduction and extension bilateral   Knee/Hip Exercises: Standing   Other Standing Knee Exercises sitting 2# hip flexion   Moist Heat Therapy   Number Minutes Moist Heat 15 Minutes   Moist Heat Location --  right anterior hip and lateral thigh   Electrical Stimulation   Electrical Stimulation Location right anterior hip and into the ITB   Electrical Stimulation Parameters IFC   Electrical Stimulation Goals Pain   Iontophoresis   Type of Iontophoresis Dexamethasone   Location RT ant hip   Dose 1.5 cc   Time 4 hour leave on patch   Manual Therapy   Manual Therapy Myofascial release   Myofascial Release to right lateral thigh and anterior thigh, did not tolerate much due to pain and tenderness  PT Short Term Goals - 07/01/14 1538    PT SHORT TERM GOAL #1   Title independent with initial HEP   Time 2   Period Weeks   Status Achieved           PT Long Term Goals - 09/05/14 1201    Additional Long Term Goals   Additional Long Term Goals Yes   PT LONG TERM GOAL #6   Title decrease anterior hip pain 75%   Time 8   Period Weeks   Status New   PT LONG TERM GOAL #7   Title increase hip strength to 4+/5   Time 8   Period Weeks   Status New   PT LONG TERM GOAL #8   Title ambulate for 30 minutes without hip pain increasing to > 2/10   Time 8   Period Weeks   Status New               Plan - 09/10/14 1648    Clinical Impression Statement Pain elicited with resisted hip flexion, decreased endurance on the elliptical, very tender lateral thigh and anterior thigh   PT Next Visit Plan work toward new goals, add exercises as tolerated   Consulted and Agree with  Plan of Care Patient        Problem List Patient Active Problem List   Diagnosis Date Noted  . Osteoarthritis of right knee 05/27/2014  . Total knee replacement status 05/23/2014  . Rheumatoid arthritis 09/24/2013  . Osteoarthritis of left knee 09/13/2013    Class: Diagnosis of    Jearld Lesch, PT 09/10/2014, 5:00 PM  [facility] [place] [address] [place], [place], [address] Phone: [phone]   Fax:  [phone]

## 2014-09-12 ENCOUNTER — Ambulatory Visit: Payer: BLUE CROSS/BLUE SHIELD | Admitting: Physical Therapy

## 2014-09-12 ENCOUNTER — Encounter: Payer: Self-pay | Admitting: Physical Therapy

## 2014-09-12 DIAGNOSIS — M25661 Stiffness of right knee, not elsewhere classified: Secondary | ICD-10-CM

## 2014-09-12 DIAGNOSIS — Z4789 Encounter for other orthopedic aftercare: Secondary | ICD-10-CM | POA: Diagnosis not present

## 2014-09-12 NOTE — Therapy (Signed)
[facility] [place] [address] [place], [place], [address] Phone: [phone]   Fax:  [phone]  Physical Therapy Treatment  Patient Details  Name: Chelsea Callahan MRN: [id number] Date of Birth: 12-16-1950 Referring Provider:  Eldred Manges, MD  Encounter Date: 09/12/2014      PT End of Session - 09/12/14 1131    Visit Number 22   Date for PT Re-Evaluation 09/22/14   PT Start Time 1051   PT Stop Time 1131   PT Time Calculation (min) 40 min   Behavior During Therapy [facility] for tasks assessed/performed      Past Medical History  Diagnosis Date  . RA (rheumatoid arthritis)   . Anemia   . Vasculitis     on prednisone -   . PONV (postoperative nausea and vomiting)   . Spinal headache     c-section  . Raynaud disease   . Primary osteoarthritis of right knee     Past Surgical History  Procedure Laterality Date  . Cesarean section      x 2  . Hernia repair    . Gastric bypass    . Tubal ligation    . Abdominal clip      to stomach post gastric bypass  . Knee arthroplasty Left 09/13/2013    Procedure: LEFT COMPUTER ASSISTED TOTAL KNEE ARTHROPLASTY;  Surgeon: Eldred Manges, MD;  [facility];  Service: Orthopedics;  Laterality: Left;  Left Total Knee Arthroplasty, Computer Assist  . Joint replacement Right     hip  . Total knee arthroplasty Right 05/22/2014    dr Ophelia Charter  . Knee arthroplasty Right 05/23/2014    Procedure: COMPUTER ASSISTED TOTAL KNEE ARTHROPLASTY;  Surgeon: Eldred Manges, MD;  [facility];  Service: Orthopedics;  Laterality: Right;    There were no vitals filed for this visit.  Visit Diagnosis:  Knee stiffness, right      Subjective Assessment - 09/12/14 1056    Subjective reports that she was pretty sore after last treatment   Currently in Pain? Yes   Pain Score 2    Pain Location Hip   Pain Orientation Right                         [facility] Adult PT Treatment/Exercise -  09/12/14 0001    Knee/Hip Exercises: Stretches   Passive Hamstring Stretch 30 seconds;3 reps   Hip Flexor Stretch 3 reps;30 seconds   Knee/Hip Exercises: Aerobic   Elliptical Elliptical R=5, I =7 x 3 minutes with 1 rest   Isokinetic 5# pulley, hip abduction and extension bilateral, added straight leg hip flexion   Knee/Hip Exercises: Machines for Strengthening   Cybex Leg Press 20# 3x10   Knee/Hip Exercises: Plyometrics   Other Plyometric Exercises resisted gait all directions   Manual Therapy   Manual Therapy Myofascial release   Myofascial Release used roller, gentle to a medium pressure anterior to lateral right thigh                  PT Short Term Goals - 07/01/14 1538    PT SHORT TERM GOAL #1   Title independent with initial HEP   Time 2   Period Weeks   Status Achieved           PT Long Term Goals - 09/05/14 1201    Additional Long Term Goals   Additional Long Term Goals Yes  PT LONG TERM GOAL #6   Title decrease anterior hip pain 75%   Time 8   Period Weeks   Status New   PT LONG TERM GOAL #7   Title increase hip strength to 4+/5   Time 8   Period Weeks   Status New   PT LONG TERM GOAL #8   Title ambulate for 30 minutes without hip pain increasing to > 2/10   Time 8   Period Weeks   Status New               Problem List Patient Active Problem List   Diagnosis Date Noted  . Osteoarthritis of right knee 05/27/2014  . Total knee replacement status 05/23/2014  . Rheumatoid arthritis 09/24/2013  . Osteoarthritis of left knee 09/13/2013    Class: Diagnosis of    Jearld Lesch., PT 09/12/2014, 11:32 AM  [facility] [place] [address] [place], [place], [address] Phone: [phone]   Fax:  [phone]

## 2014-09-16 ENCOUNTER — Encounter: Payer: Self-pay | Admitting: Physical Therapy

## 2014-09-16 ENCOUNTER — Ambulatory Visit: Payer: BLUE CROSS/BLUE SHIELD | Admitting: Physical Therapy

## 2014-09-16 DIAGNOSIS — M25661 Stiffness of right knee, not elsewhere classified: Secondary | ICD-10-CM

## 2014-09-16 DIAGNOSIS — Z4789 Encounter for other orthopedic aftercare: Secondary | ICD-10-CM | POA: Diagnosis not present

## 2014-09-16 NOTE — Therapy (Signed)
[facility] [place] [address] [place], [place], [address] Phone: [phone]   Fax:  [phone]  Physical Therapy Treatment  Patient Details  Name: Raylea Adcox MRN: [id number] Date of Birth: 18-Sep-1950 Referring Provider:  Eldred Manges, MD  Encounter Date: 09/16/2014      PT End of Session - 09/16/14 1106    Visit Number 23   Number of Visits 24   Date for PT Re-Evaluation 09/22/14   PT Start Time 1020   PT Stop Time 1100   PT Time Calculation (min) 40 min      Past Medical History  Diagnosis Date  . RA (rheumatoid arthritis)   . Anemia   . Vasculitis     on prednisone -   . PONV (postoperative nausea and vomiting)   . Spinal headache     c-section  . Raynaud disease   . Primary osteoarthritis of right knee     Past Surgical History  Procedure Laterality Date  . Cesarean section      x 2  . Hernia repair    . Gastric bypass    . Tubal ligation    . Abdominal clip      to stomach post gastric bypass  . Knee arthroplasty Left 09/13/2013    Procedure: LEFT COMPUTER ASSISTED TOTAL KNEE ARTHROPLASTY;  Surgeon: Eldred Manges, MD;  [facility];  Service: Orthopedics;  Laterality: Left;  Left Total Knee Arthroplasty, Computer Assist  . Joint replacement Right     hip  . Total knee arthroplasty Right 05/22/2014    dr Ophelia Charter  . Knee arthroplasty Right 05/23/2014    Procedure: COMPUTER ASSISTED TOTAL KNEE ARTHROPLASTY;  Surgeon: Eldred Manges, MD;  [facility];  Service: Orthopedics;  Laterality: Right;    There were no vitals filed for this visit.  Visit Diagnosis:  Knee stiffness, right      Subjective Assessment - 09/16/14 1023    Subjective doing elliptical at gym 2 sets 5 min, no pain just stiff   Currently in Pain? No/denies                         [facility] Adult PT Treatment/Exercise - 09/16/14 0001    Knee/Hip Exercises: Stretches   Hip Flexor Stretch 3 reps;30 seconds   Hip  Flexor Stretch Limitations supine on mat   Knee: Self-Stretch to increase Flexion 30 seconds;2 reps   Knee/Hip Exercises: Aerobic   Stationary Bike Nustep L8 6 min   Elliptical Elliptical R=4, I =6 x 2 min fwd 2 min backward   Tread Mill OFF push an pull bilaterally 15 times   Knee/Hip Exercises: Plyometrics   Other Plyometric Exercises 8inch step touch alternate 20 times, 6 inch step up 20 times alt   Other Plyometric Exercises 2# hip flex with knee ext 2 sets 5,2# hip flex and abd 10 times each, 2# marching 10 times each   Manual Therapy   Manual Therapy Myofascial release   Myofascial Release used roller, gentle to a medium pressure anterior to lateral right thigh                  PT Short Term Goals - 07/01/14 1538    PT SHORT TERM GOAL #1   Title independent with initial HEP   Time 2   Period Weeks   Status Achieved           PT  Long Term Goals - 09/05/14 1201    Additional Long Term Goals   Additional Long Term Goals Yes   PT LONG TERM GOAL #6   Title decrease anterior hip pain 75%   Time 8   Period Weeks   Status New   PT LONG TERM GOAL #7   Title increase hip strength to 4+/5   Time 8   Period Weeks   Status New   PT LONG TERM GOAL #8   Title ambulate for 30 minutes without hip pain increasing to > 2/10   Time 8   Period Weeks   Status New               Plan - 09/16/14 1106    Clinical Impression Statement pt with improved ability to hip flex with less pain, increased ROM   PT Home Exercise Plan HOLD 2 weeks after next visit to assure gym transition. FINALIZE gym ex        Problem List Patient Active Problem List   Diagnosis Date Noted  . Osteoarthritis of right knee 05/27/2014  . Total knee replacement status 05/23/2014  . Rheumatoid arthritis 09/24/2013  . Osteoarthritis of left knee 09/13/2013    Class: Diagnosis of    Taner Rzepka,ANGIE PTA  09/16/2014, 11:08 AM  [facility] [place] [address] [place], [place], [address] Phone: [phone]   Fax:  [phone]

## 2014-09-19 ENCOUNTER — Ambulatory Visit: Payer: BLUE CROSS/BLUE SHIELD | Admitting: Physical Therapy

## 2014-09-19 ENCOUNTER — Encounter: Payer: Self-pay | Admitting: Physical Therapy

## 2014-09-19 DIAGNOSIS — Z4789 Encounter for other orthopedic aftercare: Secondary | ICD-10-CM | POA: Diagnosis not present

## 2014-09-19 DIAGNOSIS — M25661 Stiffness of right knee, not elsewhere classified: Secondary | ICD-10-CM

## 2014-09-19 NOTE — Therapy (Signed)
[place], [place], [address] Phone: [phone]   Fax:  [phone]  Physical Therapy Treatment  Patient Details  Name: Chelsea Callahan MRN: [id number] Date of Birth: 12/27/1950 Referring Provider:  Marybelle Killings, MD  Encounter Date: 09/19/2014      PT End of Session - 09/19/14 1121    PT Start Time 1055   PT Stop Time 1135   PT Time Calculation (min) 40 min      Past Medical History  Diagnosis Date  . RA (rheumatoid arthritis)   . Anemia   . Vasculitis     on prednisone -   . PONV (postoperative nausea and vomiting)   . Spinal headache     c-section  . Raynaud disease   . Primary osteoarthritis of right knee     Past Surgical History  Procedure Laterality Date  . Cesarean section      x 2  . Hernia repair    . Gastric bypass    . Tubal ligation    . Abdominal clip      to stomach post gastric bypass  . Knee arthroplasty Left 09/13/2013    Procedure: LEFT COMPUTER ASSISTED TOTAL KNEE ARTHROPLASTY;  Surgeon: Marybelle Killings, MD;  Location: [place];  Service: Orthopedics;  Laterality: Left;  Left Total Knee Arthroplasty, Computer Assist  . Joint replacement Right     hip  . Total knee arthroplasty Right 05/22/2014    dr Lorin Mercy  . Knee arthroplasty Right 05/23/2014    Procedure: COMPUTER ASSISTED TOTAL KNEE ARTHROPLASTY;  Surgeon: Marybelle Killings, MD;  Location: [place];  Service: Orthopedics;  Laterality: Right;    There were no vitals filed for this visit.  Visit Diagnosis:  Knee stiffness, right      Subjective Assessment - 09/19/14 1100    Subjective ready for D/C, I know it will take time and I know what to do at gym   Currently in Pain? No/denies                         [facility] Adult PT Treatment/Exercise - 09/19/14 0001    Knee/Hip Exercises: Aerobic   Stationary Bike Nustep L8 6 min   Elliptical Elliptical R=4, I =6 x 3 min fwd 3 min backward   Knee/Hip  Exercises: Standing   Wall Squat 2 sets;5 sets;3 seconds   Walking with Sports Cord 40# 5 times each way, plus marching   Other Standing Knee Exercises 2# hip flex with knee ext 2 sets 10                PT Education - 09/19/14 1121    Education provided Yes   Education Details gym safety and progression, hip stretching@home    Person(s) Educated Patient   Methods Explanation   Comprehension Verbalized understanding;Returned demonstration          PT Short Term Goals - 07/01/14 1538    PT SHORT TERM GOAL #1   Title independent with initial HEP   Time 2   Period Weeks   Status Achieved           PT Long Term Goals - 09/19/14 1101    PT LONG TERM GOAL #2   Title increase ROM of the right knee to 0-110 degrees flexion   Status Achieved   PT LONG TERM GOAL #3   Title walk without assistive device  Status Achieved   PT LONG TERM GOAL #4   Title go up and down stairs step over step   Status Achieved   PT LONG TERM GOAL #5   Title increase strength of the right knee to 4/5   Status Achieved   PT LONG TERM GOAL #6   Title decrease anterior hip pain 75%   Status Achieved   PT LONG TERM GOAL #7   Title increase hip strength to 4+/5   Status Achieved   PT LONG TERM GOAL #8   Title ambulate for 30 minutes without hip pain increasing to > 2/10   Status Achieved               Plan - 09/19/14 1123    Clinical Impression Statement goals met, pt independant with gym transition. Discharge   PT Home Exercise Plan HOLD 2 weeks after next visit to assure gym transition. FINALIZE gym ex        Problem List Patient Active Problem List   Diagnosis Date Noted  . Osteoarthritis of right knee 05/27/2014  . Total knee replacement status 05/23/2014  . Rheumatoid arthritis 09/24/2013  . Osteoarthritis of left knee 09/13/2013    Class: Diagnosis of    Lesslie Mckeehan,ANGIE PTA Lum Babe PT 09/19/2014, 11:29 AM  [place] [address] [address] [place], [place], [address] Phone: [phone]   Fax:  [phone]

## 2014-09-23 ENCOUNTER — Ambulatory Visit: Payer: BLUE CROSS/BLUE SHIELD | Admitting: Physical Therapy

## 2014-09-26 ENCOUNTER — Ambulatory Visit: Payer: BLUE CROSS/BLUE SHIELD | Admitting: Physical Therapy

## 2014-12-24 DIAGNOSIS — M419 Scoliosis, unspecified: Secondary | ICD-10-CM | POA: Diagnosis not present

## 2014-12-25 DIAGNOSIS — M170 Bilateral primary osteoarthritis of knee: Secondary | ICD-10-CM | POA: Diagnosis not present

## 2014-12-25 DIAGNOSIS — M359 Systemic involvement of connective tissue, unspecified: Secondary | ICD-10-CM | POA: Diagnosis not present

## 2014-12-25 DIAGNOSIS — Z79899 Other long term (current) drug therapy: Secondary | ICD-10-CM | POA: Diagnosis not present

## 2014-12-25 DIAGNOSIS — M160 Bilateral primary osteoarthritis of hip: Secondary | ICD-10-CM | POA: Diagnosis not present

## 2015-01-12 ENCOUNTER — Encounter: Payer: Self-pay | Admitting: Physical Therapy

## 2015-01-12 ENCOUNTER — Ambulatory Visit: Payer: 59 | Admitting: Physical Therapy

## 2015-01-12 ENCOUNTER — Ambulatory Visit: Payer: BLUE CROSS/BLUE SHIELD | Attending: Orthopaedic Surgery | Admitting: Physical Therapy

## 2015-01-12 DIAGNOSIS — M25661 Stiffness of right knee, not elsewhere classified: Secondary | ICD-10-CM | POA: Insufficient documentation

## 2015-01-12 DIAGNOSIS — M545 Low back pain, unspecified: Secondary | ICD-10-CM

## 2015-01-12 DIAGNOSIS — R600 Localized edema: Secondary | ICD-10-CM | POA: Diagnosis present

## 2015-01-12 DIAGNOSIS — M533 Sacrococcygeal disorders, not elsewhere classified: Secondary | ICD-10-CM

## 2015-01-12 NOTE — Therapy (Signed)
[place], [place], [address] Phone: [phone]   Fax:  [phone]  Physical Therapy Evaluation  Patient Details  Name: Liann Spaeth MRN: [id number] Date of Birth: 1950/10/25 Referring Provider:  Marybelle Killings, MD  Encounter Date: 01/12/2015      PT End of Session - 01/12/15 0958    Visit Number 1   Date for PT Re-Evaluation 03/14/15   PT Start Time 0922   PT Stop Time 1015   PT Time Calculation (min) 53 min   Activity Tolerance Patient tolerated treatment well   Behavior During Therapy [facility] for tasks assessed/performed      Past Medical History  Diagnosis Date  . RA (rheumatoid arthritis)   . Anemia   . Vasculitis     on prednisone -   . PONV (postoperative nausea and vomiting)   . Spinal headache     c-section  . Raynaud disease   . Primary osteoarthritis of right knee     Past Surgical History  Procedure Laterality Date  . Cesarean section      x 2  . Hernia repair    . Gastric bypass    . Tubal ligation    . Abdominal clip      to stomach post gastric bypass  . Knee arthroplasty Left 09/13/2013    Procedure: LEFT COMPUTER ASSISTED TOTAL KNEE ARTHROPLASTY;  Surgeon: Marybelle Killings, MD;  Location: [place];  Service: Orthopedics;  Laterality: Left;  Left Total Knee Arthroplasty, Computer Assist  . Joint replacement Right     hip  . Total knee arthroplasty Right 05/22/2014    dr Lorin Mercy  . Knee arthroplasty Right 05/23/2014    Procedure: COMPUTER ASSISTED TOTAL KNEE ARTHROPLASTY;  Surgeon: Marybelle Killings, MD;  Location: [place];  Service: Orthopedics;  Laterality: Right;    There were no vitals filed for this visit.  Visit Diagnosis:  Right-sided low back pain without sciatica - Plan: PT plan of care cert/re-cert  SI (sacroiliac) pain - Plan: PT plan of care cert/re-cert      Subjective Assessment - 01/12/15 0931    Subjective I have been having low back pain, worse as the day  goes on.  Denies radicular symptoms.  X-rays show DDD and reports a bulging disc   Limitations Lifting;Standing;Walking;House hold activities   Patient Stated Goals have less pain   Currently in Pain? Yes   Pain Score 3    Pain Location Back   Pain Orientation Right;Lower   Pain Descriptors / Indicators Aching   Pain Type Chronic pain   Pain Onset More than a month ago   Pain Frequency Intermittent   Aggravating Factors  walking and as the day goes on pain will get up to 8/10   Pain Relieving Factors mornings are pretty good, rest helps   Effect of Pain on Daily Activities less pain and easier to walk and do things like shop            [facility] PT Assessment - 01/12/15 0001    Assessment   Medical Diagnosis low back pain   Onset Date/Surgical Date 12/22/14   Prior Therapy none on the back   Precautions   Precautions None   Balance Screen   Has the patient fallen in the past 6 months No   Has the patient had a decrease in activity level because of a fear of falling?  No   Is the  patient reluctant to leave their home because of a fear of falling?  No   Home [employer] residence   Additional Comments stairs, housework, she does some yardwork   Prior Function   Level of Independence Independent   Leisure 3x/week at the gym    Posture/Postural Control   Posture Comments right hip is higher than the left in standing, has a lift in the left shoe   AROM   Overall AROM Comments lumbar ROM decreased 25% with some discomfort   Strength   Overall Strength Comments 3+/5 for the LE's   Flexibility   Soft Tissue Assessment /Muscle Length --  tight piriformis mms, tighter on the right than the left   Palpation   Palpation comment tight and tender in the right lumbar area, rgidity palpable, right iliac crest higher than the left,  in supine the right leg about 1/2" longer, when we went to long sitting the leg length appeard to equalize, she is tender in the  right SI and this may signal a    Special Tests    Special Tests --   Ambulation/Gait   Gait Comments no assistive device, appears to have the left leg shorter than the right, as when she steps on the left foot she appears to drop down                   [facility] Adult PT Treatment/Exercise - 01/12/15 0001    Modalities   Modalities Electrical Stimulation;Moist Heat   Moist Heat Therapy   Number Minutes Moist Heat 15 Minutes   Moist Heat Location Lumbar Spine   Electrical Stimulation   Electrical Stimulation Location right SI area   Electrical Stimulation Action IFC   Electrical Stimulation Parameters tolerance   Electrical Stimulation Goals Pain                PT Education - 01/12/15 0957    Education provided Yes   Education Details SI MET exercises   Person(s) Educated Patient   Methods Explanation;Demonstration;Handout   Comprehension Verbalized understanding          PT Short Term Goals - 01/12/15 1001    PT SHORT TERM GOAL #1   Title independent with initial HEP   Time 2   Period Weeks   Status New           PT Long Term Goals - 01/12/15 1001    PT LONG TERM GOAL #1   Title decrease pain 50%   Time 8   Status New   PT LONG TERM GOAL #2   Title increase lumbar ROM to WFL's   Time 8   Period Weeks   Status New   PT LONG TERM GOAL #3   Title report increased tolerance to gait for at least 30 minutes   Time 8   Period Weeks   Status New   PT LONG TERM GOAL #4   Title be safe with exercises at the gym for the low back   Time [place] - 01/12/15 0959    Clinical Impression Statement Patient reports that she has low back pain for over a year.  She had a left TKR in 2015 and a right TKR in January 2016.  She feels that she has had some pain with back after walking with cane.  She does appear to have the right leg being longer in supine but = in long sitting   Pt will benefit from skilled  therapeutic intervention in order to improve on the following deficits Abnormal gait;Decreased strength;Difficulty walking;Pain;Increased muscle spasms   Rehab Potential Good   PT Frequency 2x / week   PT Duration 8 weeks   PT Treatment/Interventions ADLs/Self Care Home Management;Moist Heat;Traction;Ultrasound;Therapeutic activities;Therapeutic exercise;Manual techniques   PT Next Visit Plan continue to assess SI   Consulted and Agree with Plan of Care Patient          G-Codes - 18-Jan-2015 1006    Functional Assessment Tool Used foto   Functional Limitation Other PT primary   Other PT Primary Current Status (F9432) At least 40 percent but less than 60 percent impaired, limited or restricted   Other PT Primary Goal Status (W0379) At least 20 percent but less than 40 percent impaired, limited or restricted       Problem List Patient Active Problem List   Diagnosis Date Noted  . Osteoarthritis of right knee 05/27/2014  . Total knee replacement status 05/23/2014  . Rheumatoid arthritis 09/24/2013  . Osteoarthritis of left knee 09/13/2013    Class: Diagnosis of    Sumner Boast., PT 01/18/2015, 10:29 AM  [place], [place], [address] Phone: [phone]   Fax:  [phone]

## 2015-01-16 ENCOUNTER — Ambulatory Visit: Payer: BLUE CROSS/BLUE SHIELD | Admitting: Physical Therapy

## 2015-01-16 DIAGNOSIS — M545 Low back pain, unspecified: Secondary | ICD-10-CM

## 2015-01-16 DIAGNOSIS — M533 Sacrococcygeal disorders, not elsewhere classified: Secondary | ICD-10-CM

## 2015-01-16 DIAGNOSIS — M25661 Stiffness of right knee, not elsewhere classified: Secondary | ICD-10-CM

## 2015-01-16 DIAGNOSIS — R600 Localized edema: Secondary | ICD-10-CM

## 2015-01-16 NOTE — Therapy (Signed)
[facility] [address] [place], [place], [address] Phone: [phone]   Fax:  [phone]  Physical Therapy Treatment  Patient Details  Name: Chelsea Callahan MRN: [id number] Date of Birth: 16-Jun-1950 Referring Provider:  Eldred Manges, MD  Encounter Date: 01/16/2015      PT End of Session - 01/16/15 1036    Visit Number 2   Date for PT Re-Evaluation 03/14/15   PT Start Time 0945   PT Stop Time 1020   PT Time Calculation (min) 35 min   Activity Tolerance Patient tolerated treatment well   Behavior During Therapy [facility] for tasks assessed/performed      Past Medical History  Diagnosis Date   RA (rheumatoid arthritis)    Anemia    Vasculitis     on prednisone -    PONV (postoperative nausea and vomiting)    Spinal headache     c-section   Raynaud disease    Primary osteoarthritis of right knee     Past Surgical History  Procedure Laterality Date   Cesarean section      x 2   Hernia repair     Gastric bypass     Tubal ligation     Abdominal clip      to stomach post gastric bypass   Knee arthroplasty Left 09/13/2013    Procedure: LEFT COMPUTER ASSISTED TOTAL KNEE ARTHROPLASTY;  Surgeon: Eldred Manges, MD;  [facility];  Service: Orthopedics;  Laterality: Left;  Left Total Knee Arthroplasty, Computer Assist   Joint replacement Right     hip   Total knee arthroplasty Right 05/22/2014    dr Ophelia Charter   Knee arthroplasty Right 05/23/2014    Procedure: COMPUTER ASSISTED TOTAL KNEE ARTHROPLASTY;  Surgeon: Eldred Manges, MD;  Location[facility];  Service: Orthopedics;  Laterality: Right;    There were no vitals filed for this visit.  Visit Diagnosis:  Right-sided low back pain without sciatica  SI (sacroiliac) pain  Knee stiffness, right  Edema of right lower extremity      Subjective Assessment - 01/16/15 1036    Subjective Low back pain persists.  Mildly better with HEP.  No report of LE pain    Pain Score 3    Pain Location Back   Pain Orientation Right                         [facility] Adult PT Treatment/Exercise - 01/16/15 0001    Exercises   Exercises Lumbar   Lumbar Exercises: Stretches   Active Hamstring Stretch 3 reps;30 seconds   Single Knee to Chest Stretch 5 reps;10 seconds   Double Knee to Chest Stretch 5 reps;10 seconds   Lower Trunk Rotation 10 seconds;5 reps   Hip Flexor Stretch 3 reps;30 seconds   Pelvic Tilt 5 reps;10 seconds   Piriformis Stretch 3 reps;20 seconds   Lumbar Exercises: Aerobic   Stationary Bike 8 mins   Lumbar Exercises: Supine   Bridge 15 reps;5 seconds   Straight Leg Raise 10 reps;2 seconds   Large Ball Abdominal Isometric 10 reps;5 seconds   Other Supine Lumbar Exercises PPT w/ lat leg drop in hook lying 10x 5" B   Other Supine Lumbar Exercises PPt w/ SLR 2x10 B                  PT Short Term Goals - 01/12/15 1001    PT SHORT  TERM GOAL #1   Title independent with initial HEP   Time 2   Period Weeks   Status New           PT Long Term Goals - 01/12/15 1001    PT LONG TERM GOAL #1   Title decrease pain 50%   Time 8   Status New   PT LONG TERM GOAL #2   Title increase lumbar ROM to WFL's   Time 8   Period Weeks   Status New   PT LONG TERM GOAL #3   Title report increased tolerance to gait for at least 30 minutes   Time 8   Period Weeks   Status New   PT LONG TERM GOAL #4   Title be safe with exercises at the gym for the low back   Time 8   Period Weeks   Status New               Plan - 01/16/15 1037    Clinical Impression Statement Good TVA recruitment with PPT. Increased challenge w/ SLR Right vs Left   PT Next Visit Plan Cont to progress Core strength and stability        Problem List Patient Active Problem List   Diagnosis Date Noted   Osteoarthritis of right knee 05/27/2014   Total knee replacement status 05/23/2014   Rheumatoid arthritis 09/24/2013    Osteoarthritis of left knee 09/13/2013    Class: Diagnosis of    Tomie China, PTA 01/16/2015, 10:39 AM  [facility] [address] [place], [place], [address] Phone: [phone]   Fax:  [phone]

## 2015-01-20 ENCOUNTER — Ambulatory Visit: Payer: BLUE CROSS/BLUE SHIELD | Admitting: Physical Therapy

## 2015-01-22 ENCOUNTER — Ambulatory Visit: Payer: BLUE CROSS/BLUE SHIELD | Admitting: Physical Therapy

## 2015-01-22 ENCOUNTER — Encounter: Payer: Self-pay | Admitting: Physical Therapy

## 2015-01-22 DIAGNOSIS — M545 Low back pain, unspecified: Secondary | ICD-10-CM

## 2015-01-22 NOTE — Therapy (Signed)
[place], [place], [address] Phone: [phone]   Fax:  [phone]  Physical Therapy Treatment  Patient Details  Name: Chelsea Callahan MRN: [id number] Date of Birth: 08/02/1950 Referring Verner Mccrone:  Marybelle Killings, MD  Encounter Date: 01/22/2015      PT End of Session - 01/22/15 1056    Visit Number 3   Date for PT Re-Evaluation 03/14/15   PT Start Time 1017   PT Stop Time 1057   PT Time Calculation (min) 40 min   Activity Tolerance Patient tolerated treatment well   Behavior During Therapy [facility] for tasks assessed/performed      Past Medical History  Diagnosis Date  . RA (rheumatoid arthritis)   . Anemia   . Vasculitis     on prednisone -   . PONV (postoperative nausea and vomiting)   . Spinal headache     c-section  . Raynaud disease   . Primary osteoarthritis of right knee     Past Surgical History  Procedure Laterality Date  . Cesarean section      x 2  . Hernia repair    . Gastric bypass    . Tubal ligation    . Abdominal clip      to stomach post gastric bypass  . Knee arthroplasty Left 09/13/2013    Procedure: LEFT COMPUTER ASSISTED TOTAL KNEE ARTHROPLASTY;  Surgeon: Marybelle Killings, MD;  Location: [place];  Service: Orthopedics;  Laterality: Left;  Left Total Knee Arthroplasty, Computer Assist  . Joint replacement Right     hip  . Total knee arthroplasty Right 05/22/2014    dr Lorin Mercy  . Knee arthroplasty Right 05/23/2014    Procedure: COMPUTER ASSISTED TOTAL KNEE ARTHROPLASTY;  Surgeon: Marybelle Killings, MD;  Location: [place];  Service: Orthopedics;  Laterality: Right;    There were no vitals filed for this visit.  Visit Diagnosis:  Right-sided low back pain without sciatica      Subjective Assessment - 01/22/15 1018    Subjective Pt reports that she has been doing the exercises and is able to get up a little better. Pt reports that she does not have pain until later in the day    Patient Stated Goals have less pain   Currently in Pain? No/denies   Pain Score 0-No pain   Pain Location Back   Pain Orientation Right                         [facility] Adult PT Treatment/Exercise - 01/22/15 0001    Exercises   Exercises Knee/Hip   Lumbar Exercises: Stretches   Passive Hamstring Stretch 10 seconds;3 reps   Single Knee to Chest Stretch 3 reps;10 seconds   Double Knee to Chest Stretch 3 reps;10 seconds   Lower Trunk Rotation 10 seconds;3 reps   Lumbar Exercises: Aerobic   Stationary Bike NuStep L4 x6 min    Lumbar Exercises: Standing   Row Power tower;Strengthening;AROM;Both;15 reps  #35, 2 sets    Other Standing Lumbar Exercises Front reaches with blue weighted ball 2X10    Knee/Hip Exercises: Machines for Strengthening   Cybex Knee Extension #20 2X15    Cybex Knee Flexion #25 2X15   Cybex Leg Press #40 3X10    Knee/Hip Exercises: Standing   Hip Extension 2 sets;10 reps;Stengthening;AROM   Extension Limitations #10    Knee/Hip Exercises: Seated   Sit to [employer]  2 sets;10 reps;without UE support  first set holding blue weighted ball                   PT Short Term Goals - 01/22/15 1058    PT SHORT TERM GOAL #1   Title independent with initial HEP   Status Achieved           PT Long Term Goals - 01/22/15 1058    PT LONG TERM GOAL #1   Title decrease pain 50%   Status Partially Met               Plan - 01/22/15 1056    Clinical Impression Statement Pt able to tolerate gym level exercises this date without subjective reports of increase pain. Pt does have some L knee pain when performing lower trunk rotation, Pt reports that her L knee does not always work right.    Pt will benefit from skilled therapeutic intervention in order to improve on the following deficits Abnormal gait;Decreased strength;Difficulty walking;Pain;Increased muscle spasms   Rehab Potential Good   PT Frequency 2x / week   PT Duration 8 weeks   PT  Treatment/Interventions ADLs/Self Care Home Management;Moist Heat;Traction;Ultrasound;Therapeutic activities;Therapeutic exercise;Manual techniques   PT Next Visit Plan Cont to progress Core strength and stability        Problem List Patient Active Problem List   Diagnosis Date Noted  . Osteoarthritis of right knee 05/27/2014  . Total knee replacement status 05/23/2014  . Rheumatoid arthritis 09/24/2013  . Osteoarthritis of left knee 09/13/2013    Class: Diagnosis of    Scot Jun, PTA  01/22/2015, 10:59 AM  [place], [place], [address] Phone: [phone]   Fax:  [phone]

## 2015-02-03 ENCOUNTER — Ambulatory Visit: Payer: BLUE CROSS/BLUE SHIELD | Attending: Orthopaedic Surgery | Admitting: Physical Therapy

## 2015-02-03 ENCOUNTER — Encounter: Payer: Self-pay | Admitting: Physical Therapy

## 2015-02-03 DIAGNOSIS — M545 Low back pain, unspecified: Secondary | ICD-10-CM

## 2015-02-03 DIAGNOSIS — M533 Sacrococcygeal disorders, not elsewhere classified: Secondary | ICD-10-CM | POA: Diagnosis present

## 2015-02-03 DIAGNOSIS — M25661 Stiffness of right knee, not elsewhere classified: Secondary | ICD-10-CM | POA: Diagnosis present

## 2015-02-03 NOTE — Therapy (Signed)
[place], [place], [address] Phone: [phone]   Fax:  [phone]  Physical Therapy Treatment  Patient Details  Name: Chelsea Callahan MRN: [id number] Date of Birth: August 06, 1950 Referring Provider:  Marybelle Killings, MD  Encounter Date: 02/03/2015      PT End of Session - 02/03/15 1142    Visit Number 4   Date for PT Re-Evaluation 03/14/15   PT Start Time 1104   PT Stop Time 1145   PT Time Calculation (min) 41 min   Activity Tolerance Patient tolerated treatment well   Behavior During Therapy [facility] for tasks assessed/performed      Past Medical History  Diagnosis Date  . RA (rheumatoid arthritis) ([place])   . Anemia   . Vasculitis ([place])     on prednisone -   . PONV (postoperative nausea and vomiting)   . Spinal headache     c-section  . Raynaud disease   . Primary osteoarthritis of right knee     Past Surgical History  Procedure Laterality Date  . Cesarean section      x 2  . Hernia repair    . Gastric bypass    . Tubal ligation    . Abdominal clip      to stomach post gastric bypass  . Knee arthroplasty Left 09/13/2013    Procedure: LEFT COMPUTER ASSISTED TOTAL KNEE ARTHROPLASTY;  Surgeon: Marybelle Killings, MD;  Location: [place];  Service: Orthopedics;  Laterality: Left;  Left Total Knee Arthroplasty, Computer Assist  . Joint replacement Right     hip  . Total knee arthroplasty Right 05/22/2014    dr Lorin Mercy  . Knee arthroplasty Right 05/23/2014    Procedure: COMPUTER ASSISTED TOTAL KNEE ARTHROPLASTY;  Surgeon: Marybelle Killings, MD;  Location: [place];  Service: Orthopedics;  Laterality: Right;    There were no vitals filed for this visit.  Visit Diagnosis:  Right-sided low back pain without sciatica  SI (sacroiliac) pain      Subjective Assessment - 02/03/15 1104    Subjective Pt reports that she has not taken pain medication today. Pt reports pain in her low back R>L   Limitations  Lifting;Standing;Walking;House hold activities   Patient Stated Goals have less pain   Currently in Pain? Yes   Pain Score 4    Pain Location Back   Pain Orientation Right;Lower   Pain Descriptors / Indicators Aching   Pain Type Chronic pain            [facility] PT Assessment - 02/03/15 0001    Strength   Overall Strength Comments 3+/5 for the LE's                     [facility] Adult PT Treatment/Exercise - 02/03/15 0001    Lumbar Exercises: Stretches   Passive Hamstring Stretch 5 reps;10 seconds   Single Knee to Chest Stretch 3 reps;10 seconds   Lower Trunk Rotation 1 rep;10 seconds   Lumbar Exercises: Aerobic   Stationary Bike NuStep L5 x8 min    Lumbar Exercises: Standing   Row Power tower;Strengthening;AROM;Both;15 reps  2 sets    Row Limitations 25    Other Standing Lumbar Exercises Front reaches with blue weighted ball 2X10    Lumbar Exercises: Supine   Bridge 5 seconds;10 reps;2 seconds   Straight Leg Raise 10 reps;2 seconds   Knee/Hip Exercises: Machines for Strengthening   Cybex Knee Extension #  20 3X10    Cybex Knee Flexion #25 2X15   Cybex Leg Press #40 3X10    Knee/Hip Exercises: Seated   Sit to Sand 2 sets;without UE support;15 reps  10lb                  PT Short Term Goals - 01/22/15 1058    PT SHORT TERM GOAL #1   Title independent with initial HEP   Status Achieved           PT Long Term Goals - 02/03/15 1145    PT LONG TERM GOAL #2   Title increase lumbar ROM to WFL's   PT LONG TERM GOAL #3   Title report increased tolerance to gait for at least 30 minutes   Status Partially Met               Plan - 02/03/15 1143    Clinical Impression Statement Pt reports that she does not have pain post treatment. Pt tolerated all interventions well. Pt does report R hip pain with SLR and decrease ROM.    Pt will benefit from skilled therapeutic intervention in order to improve on the following deficits Abnormal gait;Decreased  strength;Difficulty walking;Pain;Increased muscle spasms   Rehab Potential Good   PT Frequency 2x / week   PT Duration 8 weeks   PT Treatment/Interventions ADLs/Self Care Home Management;Moist Heat;Traction;Ultrasound;Therapeutic activities;Therapeutic exercise;Manual techniques   PT Next Visit Plan Cont to progress Core strength and stability        Problem List Patient Active Problem List   Diagnosis Date Noted  . Osteoarthritis of right knee 05/27/2014  . Total knee replacement status 05/23/2014  . Rheumatoid arthritis ([place]) 09/24/2013  . Osteoarthritis of left knee 09/13/2013    Class: Diagnosis of    Scot Jun, PTA  02/03/2015, 11:46 AM  [place], [place], [address] Phone: [phone]   Fax:  [phone]

## 2015-02-05 ENCOUNTER — Encounter: Payer: Self-pay | Admitting: Physical Therapy

## 2015-02-05 ENCOUNTER — Ambulatory Visit: Payer: BLUE CROSS/BLUE SHIELD | Admitting: Physical Therapy

## 2015-02-05 DIAGNOSIS — M533 Sacrococcygeal disorders, not elsewhere classified: Secondary | ICD-10-CM

## 2015-02-05 DIAGNOSIS — M545 Low back pain, unspecified: Secondary | ICD-10-CM

## 2015-02-05 NOTE — Therapy (Signed)
[place] [facility] [place], [place], [address] Phone: [phone]   Fax:  [phone]  Physical Therapy Treatment  Patient Details  Name: Chelsea Callahan MRN: [id number] Date of Birth: 05/27/1950 Referring Provider:  Marybelle Killings, MD  Encounter Date: 02/05/2015      PT End of Session - 02/05/15 1142    Visit Number 4   Date for PT Re-Evaluation 03/14/15   PT Start Time 1102   PT Stop Time 1142   PT Time Calculation (min) 40 min   Activity Tolerance Patient tolerated treatment well   Behavior During Therapy [facility] for tasks assessed/performed      Past Medical History  Diagnosis Date  . RA (rheumatoid arthritis) ([place])   . Anemia   . Vasculitis ([place])     on prednisone -   . PONV (postoperative nausea and vomiting)   . Spinal headache     c-section  . Raynaud disease   . Primary osteoarthritis of right knee     Past Surgical History  Procedure Laterality Date  . Cesarean section      x 2  . Hernia repair    . Gastric bypass    . Tubal ligation    . Abdominal clip      to stomach post gastric bypass  . Knee arthroplasty Left 09/13/2013    Procedure: LEFT COMPUTER ASSISTED TOTAL KNEE ARTHROPLASTY;  Surgeon: Marybelle Killings, MD;  Location: [place];  Service: Orthopedics;  Laterality: Left;  Left Total Knee Arthroplasty, Computer Assist  . Joint replacement Right     hip  . Total knee arthroplasty Right 05/22/2014    dr Lorin Mercy  . Knee arthroplasty Right 05/23/2014    Procedure: COMPUTER ASSISTED TOTAL KNEE ARTHROPLASTY;  Surgeon: Marybelle Killings, MD;  Location: [place];  Service: Orthopedics;  Laterality: Right;    There were no vitals filed for this visit.  Visit Diagnosis:  Right-sided low back pain without sciatica  SI (sacroiliac) pain      Subjective Assessment - 02/05/15 1109    Subjective Pt reports that she has R hip pain in the evening   Patient Stated Goals have less pain   Currently in Pain?  No/denies   Pain Score 0-No pain   Pain Location Back   Pain Orientation Right   Pain Type Chronic pain   Pain Onset More than a month ago                         [facility] Adult PT Treatment/Exercise - 02/05/15 0001    Lumbar Exercises: Aerobic   Elliptical I10 R5 x38mn   Lumbar Exercises: Standing   Row Power tower;Strengthening;AROM;Both;15 reps   Row Limitations 35   Other Standing Lumbar Exercises Front reaches with yellow weighted ball 2X10    Other Standing Lumbar Exercises Standing straight arm pull downs #35 2x15    Lumbar Exercises: Seated   Sit to Stand 10 reps  with overhead lift holding yellow weighted ball, x2    Knee/Hip Exercises: Aerobic   Stationary Bike x 6 min    Knee/Hip Exercises: Machines for Strengthening   Cybex Leg Press #50 3X10    Shoulder Exercises: Power [employer]  Other Power TUnumProvidentExercises Seated Rows #35 2x15    Other Power Tower Exercises Lat pull downs #25 2x15  PT Short Term Goals - 01/22/15 1058    PT SHORT TERM GOAL #1   Title independent with initial HEP   Status Achieved           PT Long Term Goals - 02/03/15 1145    PT LONG TERM GOAL #2   Title increase lumbar ROM to WFL's   PT LONG TERM GOAL #3   Title report increased tolerance to gait for at least 30 minutes   Status Partially Met               Plan - 02/05/15 1142    Clinical Impression Statement Pt tolerated more standing and strengthening interventions this date. Pt able to complete all interventions without subjective reports of pain.   Pt will benefit from skilled therapeutic intervention in order to improve on the following deficits Abnormal gait;Decreased strength;Difficulty walking;Pain;Increased muscle spasms   Rehab Potential Good   PT Frequency 2x / week   PT Duration 8 weeks   PT Treatment/Interventions ADLs/Self Care Home Management;Moist Heat;Traction;Ultrasound;Therapeutic activities;Therapeutic exercise;Manual  techniques   PT Next Visit Plan Cont to progress Core strength and stability        Problem List Patient Active Problem List   Diagnosis Date Noted  . Osteoarthritis of right knee 05/27/2014  . Total knee replacement status 05/23/2014  . Rheumatoid arthritis ([place]) 09/24/2013  . Osteoarthritis of left knee 09/13/2013    Class: Diagnosis of    Scot Jun, PTA  02/05/2015, 11:44 AM  [place] [address] [place], [place], [address] Phone: [phone]   Fax:  [phone]

## 2015-02-11 ENCOUNTER — Encounter: Payer: Self-pay | Admitting: Physical Therapy

## 2015-02-11 ENCOUNTER — Ambulatory Visit: Payer: BLUE CROSS/BLUE SHIELD | Admitting: Physical Therapy

## 2015-02-11 DIAGNOSIS — M545 Low back pain, unspecified: Secondary | ICD-10-CM

## 2015-02-11 DIAGNOSIS — M25661 Stiffness of right knee, not elsewhere classified: Secondary | ICD-10-CM

## 2015-02-11 DIAGNOSIS — M533 Sacrococcygeal disorders, not elsewhere classified: Secondary | ICD-10-CM

## 2015-02-11 NOTE — Therapy (Signed)
[facility] [place], [place], [address] Phone: [phone]   Fax:  [phone]  Physical Therapy Treatment  Patient Details  Name: Chelsea Callahan MRN: [id number] Date of Birth: 1951/04/05 Referring Provider:  Marybelle Killings, MD  Encounter Date: 02/11/2015      PT End of Session - 02/11/15 1101    Visit Number 5   Date for PT Re-Evaluation 03/14/15   PT Start Time 1012   PT Stop Time 1102   PT Time Calculation (min) 50 min   Activity Tolerance Patient tolerated treatment well   Behavior During Therapy [facility] for tasks assessed/performed      Past Medical History  Diagnosis Date  . RA (rheumatoid arthritis) ([place])   . Anemia   . Vasculitis ([place])     on prednisone -   . PONV (postoperative nausea and vomiting)   . Spinal headache     c-section  . Raynaud disease   . Primary osteoarthritis of right knee     Past Surgical History  Procedure Laterality Date  . Cesarean section      x 2  . Hernia repair    . Gastric bypass    . Tubal ligation    . Abdominal clip      to stomach post gastric bypass  . Knee arthroplasty Left 09/13/2013    Procedure: LEFT COMPUTER ASSISTED TOTAL KNEE ARTHROPLASTY;  Surgeon: Marybelle Killings, MD;  Location: [facility];  Service: Orthopedics;  Laterality: Left;  Left Total Knee Arthroplasty, Computer Assist  . Joint replacement Right     hip  . Total knee arthroplasty Right 05/22/2014    dr Lorin Mercy  . Knee arthroplasty Right 05/23/2014    Procedure: COMPUTER ASSISTED TOTAL KNEE ARTHROPLASTY;  Surgeon: Marybelle Killings, MD;  Location: [place];  Service: Orthopedics;  Laterality: Right;    There were no vitals filed for this visit.  Visit Diagnosis:  Right-sided low back pain without sciatica  SI (sacroiliac) pain  Knee stiffness, right      Subjective Assessment - 02/11/15 1012    Subjective Pt reports that she has learned if she tightens her core her back doesn't hurt.   Currently in Pain? No/denies   Pain Score 0-No pain                         [facility] Adult PT Treatment/Exercise - 02/11/15 0001    Lumbar Exercises: Aerobic   Stationary Bike NuStep L6 x7 min    Lumbar Exercises: Standing   Row Power tower;Strengthening;AROM;Both;15 reps  2 sets    Row Limitations 35   Other Standing Lumbar Exercises Standing straight arm pull downs #35 2x15    Lumbar Exercises: Supine   Bridge 10 reps;2 seconds  2 sets    Straight Leg Raise 10 reps;2 seconds   Knee/Hip Exercises: Machines for Strengthening   Cybex Leg Press #50 3X10    Knee/Hip Exercises: Seated   Long Arc Quad Both;2 sets;10 reps   Long Arc Quad Weight 3 lbs.   Other Seated Knee/Hip Exercises March 2x10   Marching Weights 3 lbs.   Hamstring Curl Both;Strengthening;2 sets;10 reps   Hamstring Limitations Blue Tband   Sit to [employer] 2 sets;without UE support;15 reps  10lb    Shoulder Exercises: Power Armed forces logistics/support/administrative officer Rows #45 3x10    Other Power Engineer, water Lat pull downs #35 3x10  PT Short Term Goals - 01/22/15 1058    PT SHORT TERM GOAL #1   Title independent with initial HEP   Status Achieved           PT Long Term Goals - 02/03/15 1145    PT LONG TERM GOAL #2   Title increase lumbar ROM to WFL's   PT LONG TERM GOAL #3   Title report increased tolerance to gait for at least 30 minutes   Status Partially Met               Plan - 02/11/15 1102    Clinical Impression Statement Continues to progress, reports less pain when she contracts her core.    Pt will benefit from skilled therapeutic intervention in order to improve on the following deficits Abnormal gait;Decreased strength;Difficulty walking;Pain;Increased muscle spasms   Rehab Potential Good   PT Frequency 2x / week   PT Duration 8 weeks   PT Treatment/Interventions ADLs/Self Care Home Management;Moist Heat;Traction;Ultrasound;Therapeutic  activities;Therapeutic exercise;Manual techniques   PT Next Visit Plan Cont to progress Core strength and stability        Problem List Patient Active Problem List   Diagnosis Date Noted  . Osteoarthritis of right knee 05/27/2014  . Total knee replacement status 05/23/2014  . Rheumatoid arthritis ([place]) 09/24/2013  . Osteoarthritis of left knee 09/13/2013    Class: Diagnosis of    Scot Jun, PTA 02/11/2015, 11:05 AM  [place], [place], [address] Phone: [phone]   Fax:  [phone]

## 2015-02-13 ENCOUNTER — Ambulatory Visit: Payer: BLUE CROSS/BLUE SHIELD | Admitting: Physical Therapy

## 2015-02-13 ENCOUNTER — Encounter: Payer: Self-pay | Admitting: Physical Therapy

## 2015-02-13 DIAGNOSIS — M545 Low back pain, unspecified: Secondary | ICD-10-CM

## 2015-02-13 NOTE — Therapy (Addendum)
[place], [place], [address] Phone: [phone]   Fax:  [phone]  Physical Therapy Treatment  Patient Details  Name: Chelsea Callahan MRN: [id number] Date of Birth: [birth year] No Data Recorded  Encounter Date: 02/13/2015      PT End of Session - 02/13/15 0853    Visit Number 6   Date for PT Re-Evaluation 03/14/15   PT Start Time 0810   PT Stop Time 0850   PT Time Calculation (min) 40 min      Past Medical History  Diagnosis Date  . RA (rheumatoid arthritis) ([place])   . Anemia   . Vasculitis ([place])     on prednisone -   . PONV (postoperative nausea and vomiting)   . Spinal headache     c-section  . Raynaud disease   . Primary osteoarthritis of right knee     Past Surgical History  Procedure Laterality Date  . Cesarean section      x 2  . Hernia repair    . Gastric bypass    . Tubal ligation    . Abdominal clip      to stomach post gastric bypass  . Knee arthroplasty Left 09/13/2013    Procedure: LEFT COMPUTER ASSISTED TOTAL KNEE ARTHROPLASTY;  Surgeon: Marybelle Killings, MD;  Location: [facility];  Service: Orthopedics;  Laterality: Left;  Left Total Knee Arthroplasty, Computer Assist  . Joint replacement Right     hip  . Total knee arthroplasty Right 05/22/2014    dr Lorin Mercy  . Knee arthroplasty Right 05/23/2014    Procedure: COMPUTER ASSISTED TOTAL KNEE ARTHROPLASTY;  Surgeon: Marybelle Killings, MD;  Location: [facility];  Service: Orthopedics;  Laterality: Right;    There were no vitals filed for this visit.  Visit Diagnosis:  Right-sided low back pain without sciatica      Subjective Assessment - 02/13/15 0815    Subjective running latethis morning,knees not working- medicated so no pain   Currently in Pain? No/denies            [facility] PT Assessment - 02/13/15 0001    AROM   Overall AROM Comments lumbar ROM decreased 25% with some discomfort  no significant changes                      [facility] Adult PT Treatment/Exercise - 02/13/15 0001    Lumbar Exercises: Aerobic   Stationary Bike NuStep L6 x101min    Elliptical I10 R5 x30min   Lumbar Exercises: Standing   Row Power tower;Strengthening;AROM;Both;15 reps   Row Limitations 35 3 sets 10   Other Standing Lumbar Exercises standing obl 8# 2 sets 10   Other Standing Lumbar Exercises weighted ball chest press into OH 2 set s10   Lumbar Exercises: Supine   Other Supine Lumbar Exercises 15 min core stab/pilates   Shoulder Exercises: Power Warden/ranger Exercises Lat pull downs #35 3x10                  PT Short Term Goals - 01/22/15 1058    PT SHORT TERM GOAL #1   Title independent with initial HEP   Status Achieved           PT Long Term Goals - 02/13/15 0830    PT LONG TERM GOAL #1   Title decrease pain 50%   Status On-going   PT LONG TERM GOAL #2  Title increase lumbar ROM to WFL's   Status On-going   PT LONG TERM GOAL #3   Title report increased tolerance to gait for at least 30 minutes   Baseline better in morning and worse in evening so hard to stay   Status Partially Met   PT LONG TERM GOAL #4   Title be safe with exercises at the gym for the low back   PT LONG TERM GOAL #5   Title increase strength of the right knee to 4/5   Status On-going               Plan - 02/13/15 0853    Clinical Impression Statement pt states she is at gym freq, but continues to have weaknessand pain with core ther ex   PT Next Visit Plan Issue Handout for gym program        Problem List Patient Active Problem List   Diagnosis Date Noted  . Osteoarthritis of right knee 05/27/2014  . Total knee replacement status 05/23/2014  . Rheumatoid arthritis ([place]) 09/24/2013  . Osteoarthritis of left knee 09/13/2013    Class: Diagnosis of   PHYSICAL THERAPY DISCHARGE SUMMARY  Visits from Start of Care: 6   Plan: Patient agrees to discharge.  Patient goals were  partially met. Patient is being discharged due to being pleased with the current functional level.  ?????       Kynesha Guerin,ANGIE PTA 02/13/2015, 8:54 AM  [place], [place], [address] Phone: [phone]   Fax:  [phone]  Name: Chelsea Callahan MRN: [id number] Date of Birth: 07-17-1950

## 2015-02-17 ENCOUNTER — Ambulatory Visit: Payer: BLUE CROSS/BLUE SHIELD | Admitting: Physical Therapy

## 2015-02-19 ENCOUNTER — Ambulatory Visit: Payer: BLUE CROSS/BLUE SHIELD | Admitting: Physical Therapy

## 2015-02-24 DIAGNOSIS — M419 Scoliosis, unspecified: Secondary | ICD-10-CM | POA: Diagnosis not present

## 2015-02-24 DIAGNOSIS — M1711 Unilateral primary osteoarthritis, right knee: Secondary | ICD-10-CM | POA: Diagnosis not present

## 2015-02-24 DIAGNOSIS — M160 Bilateral primary osteoarthritis of hip: Secondary | ICD-10-CM | POA: Diagnosis not present

## 2015-02-24 DIAGNOSIS — M170 Bilateral primary osteoarthritis of knee: Secondary | ICD-10-CM | POA: Diagnosis not present

## 2015-11-05 DIAGNOSIS — Z79899 Other long term (current) drug therapy: Secondary | ICD-10-CM | POA: Diagnosis not present

## 2015-11-16 DIAGNOSIS — R634 Abnormal weight loss: Secondary | ICD-10-CM | POA: Diagnosis not present

## 2015-11-16 DIAGNOSIS — Z09 Encounter for follow-up examination after completed treatment for conditions other than malignant neoplasm: Secondary | ICD-10-CM | POA: Diagnosis not present

## 2015-11-16 DIAGNOSIS — M359 Systemic involvement of connective tissue, unspecified: Secondary | ICD-10-CM | POA: Diagnosis not present

## 2015-11-16 DIAGNOSIS — M5137 Other intervertebral disc degeneration, lumbosacral region: Secondary | ICD-10-CM | POA: Diagnosis not present

## 2016-01-26 DIAGNOSIS — M79622 Pain in left upper arm: Secondary | ICD-10-CM | POA: Diagnosis not present

## 2016-01-26 DIAGNOSIS — M545 Low back pain: Secondary | ICD-10-CM | POA: Diagnosis not present

## 2016-01-26 DIAGNOSIS — M25562 Pain in left knee: Secondary | ICD-10-CM | POA: Diagnosis not present

## 2016-01-28 ENCOUNTER — Other Ambulatory Visit (INDEPENDENT_AMBULATORY_CARE_PROVIDER_SITE_OTHER): Payer: Self-pay | Admitting: Orthopaedic Surgery

## 2016-01-28 DIAGNOSIS — M4716 Other spondylosis with myelopathy, lumbar region: Secondary | ICD-10-CM

## 2016-01-28 DIAGNOSIS — M4316 Spondylolisthesis, lumbar region: Secondary | ICD-10-CM

## 2016-02-09 ENCOUNTER — Other Ambulatory Visit: Payer: 59

## 2016-02-15 ENCOUNTER — Ambulatory Visit
Admission: RE | Admit: 2016-02-15 | Discharge: 2016-02-15 | Disposition: A | Discharge status: Home / Self Care | Payer: Medicare Other | Source: Ambulatory Visit | Attending: Orthopaedic Surgery | Admitting: Orthopaedic Surgery

## 2016-02-15 DIAGNOSIS — M4716 Other spondylosis with myelopathy, lumbar region: Secondary | ICD-10-CM

## 2016-02-15 DIAGNOSIS — M4316 Spondylolisthesis, lumbar region: Secondary | ICD-10-CM

## 2016-02-15 DIAGNOSIS — M48061 Spinal stenosis, lumbar region without neurogenic claudication: Secondary | ICD-10-CM | POA: Diagnosis not present

## 2016-02-19 ENCOUNTER — Ambulatory Visit (INDEPENDENT_AMBULATORY_CARE_PROVIDER_SITE_OTHER): Payer: Self-pay | Admitting: Orthopaedic Surgery

## 2016-03-09 ENCOUNTER — Ambulatory Visit (INDEPENDENT_AMBULATORY_CARE_PROVIDER_SITE_OTHER): Payer: Medicare Other | Admitting: Orthopaedic Surgery

## 2016-03-09 ENCOUNTER — Encounter (INDEPENDENT_AMBULATORY_CARE_PROVIDER_SITE_OTHER): Payer: Self-pay

## 2016-03-09 ENCOUNTER — Ambulatory Visit (INDEPENDENT_AMBULATORY_CARE_PROVIDER_SITE_OTHER): Payer: Medicare Other

## 2016-03-09 ENCOUNTER — Encounter (INDEPENDENT_AMBULATORY_CARE_PROVIDER_SITE_OTHER): Payer: Self-pay | Admitting: Orthopaedic Surgery

## 2016-03-09 VITALS — BP 136/74 | HR 66

## 2016-03-09 DIAGNOSIS — M48062 Spinal stenosis, lumbar region with neurogenic claudication: Secondary | ICD-10-CM

## 2016-03-09 DIAGNOSIS — M545 Low back pain, unspecified: Secondary | ICD-10-CM | POA: Insufficient documentation

## 2016-03-09 DIAGNOSIS — M48061 Spinal stenosis, lumbar region without neurogenic claudication: Secondary | ICD-10-CM | POA: Insufficient documentation

## 2016-03-09 DIAGNOSIS — G8929 Other chronic pain: Secondary | ICD-10-CM

## 2016-03-09 NOTE — Progress Notes (Addendum)
Office Visit Note   Patient: Chelsea Callahan           Date of Birth: 12-21-1950           MRN: [id number] Visit Date: 03/09/2016              Requested by: No referring provider defined for this encounter. PCP: Pcp Not In System   Assessment & Plan: Visit Diagnoses:  1. Spinal stenosis of lumbar region with neurogenic claudication   2. Chronic bilateral low back pain without sciatica   3.      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 She would be a candidate for simple decompression would not require a fusion. She has one level disease plan would be single level decompression overnight stay in the hospital use of intraoperative microscope. She would be on a walking program after the surgery. We'll check her back in 3 months or symptoms of gradually progressing and she will call if she decides she wants to proceed with the surgery prior to office follow-up in 3 months.  Follow-Up Instructions: Return in about 3 months (around 06/09/2016).  Patient spinal stenosis. Long discussion about the physiology of the condition was held with the patient. We spent the 20 minutes discussing surgical options. She does not have instability that would require an Instrumented fusion. She shows 1 mm and could have simple decompression procedure. We discussed the risks of infection reoperation, dural tear spinal fluid leak potential over a number of years for recurrent stenosis to occur she could have progressive spondylolisthesis that might require fusion at some point in the future. Questions were elicited and answered. Orders:  Orders Placed This Encounter  Procedures  . XR Lumbar Spine 2-3 Views   No orders of the defined types were placed in this encounter.     Procedures: No procedures performed   Clinical Data: No additional findings.   Subjective: Chief Complaint  Patient presents with  . Spine -  Pain, Follow-up    Patient is here to review MRI Lumbar spine.  Patient's has leg weakness when she walks in the grocery store. She's made from the air plane closest to the baggage claim just to the bag is clean and then has to stop and sit down and rest. She is not able to get her bags and then go to the car. She gets relief with sitting. She is able to get up walk and repeat her distance.  Review of Systems 14 point review of systems updated she's had previous total knee replacement doing well right total hip arthroplasty done in [place]. Diagnosis of rheumatoid arthritis. Neurogenic claudication symptoms with progression. Raynaud's disease history of vasculitis. Negative for cardiac problems   Objective: Vital Signs: BP 136/74   Pulse 66   Physical Exam  Constitutional: She is oriented to person, place, and time. She appears well-developed.  HENT:  Head: Normocephalic.  Right Ear: External ear normal.  Left Ear: External ear normal.  Eyes: Pupils are equal, round, and reactive to light.  Neck: No tracheal deviation present. No thyromegaly present.  Cardiovascular: Normal rate.   Pulmonary/Chest: Effort normal.  Abdominal: Soft.  Neurological: She is alert and oriented to person, place, and time.  Skin: Skin is warm and dry.  Psychiatric: She has a normal mood and affect. Her behavior is normal.    Ortho Exam normal skin over the lumbar spine show scoliosis. Slight obliquity  of the pelvis. Well-healed right total hip arthroplasty incision in total knee arthroplasty. Distal pulses are intact EHL anterior tib is intact negative straight leg raising. No pain with right left hip range of motion.  Specialty Comments:  No specialty comments available.  Imaging: Xr Lumbar Spine 2-3 Views  Result Date: 03/09/2016 4 view x-rays of the lumbar spine are obtained AP lateral and a lateral flexion-extension x-ray. This shows changes at the L4-5 level from 9.2-10.3 mm with flexion  extension. She moves slightly more than 1 mm. Impression: L4-5 degenerative spondylolisthesis with 1 mm change on flexion-extension  MR Lumbar Spine Wo Contrast (Accession [id number]) (Order [id number])  Imaging  Date: 02/15/2016 Department: [facility] AT [address] Released By: Eugenio Hoes Authorizing: Eldred Manges, MD  Exam Information   Status Exam Begun  Exam Ended   Final [99] 02/15/2016 12:52 PM 02/15/2016 1:23 PM  PACS Images   Show images for MR Lumbar Spine Wo Contrast  Study Result   CLINICAL DATA:  Right-sided chronic low back pain and bilateral leg pain, increasing. Lumbar spondylolisthesis.  EXAM: MRI LUMBAR SPINE WITHOUT CONTRAST  TECHNIQUE: Multiplanar, multisequence MR imaging of the lumbar spine was performed. No intravenous contrast was administered.  COMPARISON:  Radiographs dated 01/26/2016  FINDINGS: Segmentation:  Normal.  Alignment: 5 mm spondylolisthesis at L4-5 due to severe bilateral facet arthritis.  Vertebrae:  Normal.  Conus medullaris: Extends to the L2 level and appears normal.  Paraspinal and other soft tissues: There are several slightly prominent lymph nodes in the left periaortic level at L2-3, the largest having a minimum diameter of 11.4 mm. 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  Disc levels:  T10-11 through T12-L1: Disc space narrowing. Small broad-based disc bulges with no neural impingement.  L1-2 and L2-3:  Normal.  L3-4: Left far lateral disc protrusion with a slight mass effect upon the left L3 nerve lateral to the neural foramen. Slight bilateral facet arthritis.  L4-5: Grade 1 spondylolisthesis with severe bilateral facet arthritis. Severe spinal stenosis with marked compression of both lateral recesses which should affect both L5 nerves, best seen on image 23 of series 7. Minimal broad-based bulge of the uncovered disc, most prominent  into the right neural foramen. Moderately severe bilateral foraminal stenosis right worse than left. Slight must effect upon the right L4 nerve at the lateral aspect of the neural foramen, best seen on images 21 of series 6 and series 7.  L5-S1: Severe bilateral facet arthritis. Normal disc. Widely patent neural foramina.  IMPRESSION: 1. Severe spinal stenosis and severe right lateral recess compression at L4-5. 2. Disc protrusion in and lateral to the right neural foramen at L4-5 has a mass effect upon the L4 nerve. 3. Left far lateral disc protrusion at L3-4 has a mass effect upon the left L3 nerve. 4. Nonspecific slight left periaortic adenopathy.   Electronically Signed   By: Francene Boyers M.D.   On: 02/15/2016 15:06      PMFS History: Patient Active Problem List   Diagnosis Date Noted  . Low back pain 03/09/2016  . Spinal stenosis of lumbar region 03/09/2016  . Osteoarthritis of right knee 05/27/2014  . Total knee replacement status 05/23/2014  . Rheumatoid arthritis ([facility]) 09/24/2013  . Osteoarthritis of left knee 09/13/2013    Class: Diagnosis of   Past Medical History:  Diagnosis Date  . Anemia   . PONV (postoperative nausea and vomiting)   . Primary osteoarthritis  of right knee   . RA (rheumatoid arthritis) ([facility])   . Raynaud disease   . Spinal headache    c-section  . Vasculitis ([facility])    on prednisone -     Family History  Problem Relation Age of Onset  . Autoimmune disease Other   . Cancer - Other Other     Past Surgical History:  Procedure Laterality Date  . abdominal clip     to stomach post gastric bypass  . CESAREAN SECTION     x 2  . GASTRIC BYPASS    . HERNIA REPAIR    . JOINT REPLACEMENT Right    hip  . KNEE ARTHROPLASTY Left 09/13/2013   Procedure: LEFT COMPUTER ASSISTED TOTAL KNEE ARTHROPLASTY;  Surgeon: Eldred Manges, MD;  [facility];  Service: Orthopedics;  Laterality: Left;  Left Total Knee Arthroplasty, Computer Assist  .  KNEE ARTHROPLASTY Right 05/23/2014   Procedure: COMPUTER ASSISTED TOTAL KNEE ARTHROPLASTY;  Surgeon: Eldred Manges, MD;  Location[facility];  Service: Orthopedics;  Laterality: Right;  . TOTAL KNEE ARTHROPLASTY Right 05/22/2014   dr Ophelia Charter  . TUBAL LIGATION     Social History   Occupational History  . Not on file.   Social History Main Topics  . Smoking status: Former Smoker    Types: Cigarettes  . Smokeless tobacco: Never Used     Comment: Quit smoking cigarettes in 1999  . Alcohol use 2.4 oz/week    4 Glasses of wine per week  . Drug use: No  . Sexual activity: Not on file

## 2016-03-28 ENCOUNTER — Telehealth (INDEPENDENT_AMBULATORY_CARE_PROVIDER_SITE_OTHER): Payer: Self-pay | Admitting: Orthopaedic Surgery

## 2016-03-28 NOTE — Telephone Encounter (Signed)
Patient wants to schedule surgery  

## 2016-03-29 NOTE — Telephone Encounter (Signed)
See message. Patient is ready to schedule. Would you please fill out surgical sheet, thank you.

## 2016-03-29 NOTE — Telephone Encounter (Signed)
Blue sheet in my OUTbox.  Thanks ucall

## 2016-04-07 DIAGNOSIS — Z09 Encounter for follow-up examination after completed treatment for conditions other than malignant neoplasm: Secondary | ICD-10-CM | POA: Diagnosis not present

## 2016-04-07 DIAGNOSIS — H2513 Age-related nuclear cataract, bilateral: Secondary | ICD-10-CM | POA: Diagnosis not present

## 2016-04-07 DIAGNOSIS — H04123 Dry eye syndrome of bilateral lacrimal glands: Secondary | ICD-10-CM | POA: Diagnosis not present

## 2016-04-07 DIAGNOSIS — Z79899 Other long term (current) drug therapy: Secondary | ICD-10-CM | POA: Diagnosis not present

## 2016-05-01 ENCOUNTER — Other Ambulatory Visit: Payer: Self-pay | Admitting: Rheumatology

## 2016-05-03 NOTE — Telephone Encounter (Signed)
Labs are due, we can do these in Jan at her follow up  Last visit 11/16/15 Next visit 05/12/16 Eye exam is also due patient states she did go for the eye exam,in December was told normal Ok to refill per Dr Corliss Skains

## 2016-05-05 ENCOUNTER — Encounter (HOSPITAL_COMMUNITY)
Admission: RE | Admit: 2016-05-05 | Discharge: 2016-05-05 | Disposition: A | Discharge status: Home / Self Care | Payer: Medicare Other | Source: Ambulatory Visit | Attending: Orthopaedic Surgery | Admitting: Orthopaedic Surgery

## 2016-05-05 ENCOUNTER — Encounter (HOSPITAL_COMMUNITY): Payer: Self-pay

## 2016-05-05 DIAGNOSIS — Z01812 Encounter for preprocedural laboratory examination: Secondary | ICD-10-CM | POA: Diagnosis not present

## 2016-05-05 HISTORY — DX: Other neutropenia: D70.8

## 2016-05-05 LAB — URINALYSIS, ROUTINE W REFLEX MICROSCOPIC
Bilirubin Urine: NEGATIVE
Glucose, UA: NEGATIVE mg/dL
HGB URINE DIPSTICK: NEGATIVE
Ketones, ur: NEGATIVE mg/dL
Leukocytes, UA: NEGATIVE
NITRITE: NEGATIVE
PROTEIN: NEGATIVE mg/dL
SPECIFIC GRAVITY, URINE: 1.01 (ref 1.005–1.030)
pH: 6 (ref 5.0–8.0)

## 2016-05-05 LAB — COMPREHENSIVE METABOLIC PANEL
ALBUMIN: 3.9 g/dL (ref 3.5–5.0)
ALT: 27 U/L (ref 14–54)
ANION GAP: 6 (ref 5–15)
AST: 34 U/L (ref 15–41)
Alkaline Phosphatase: 83 U/L (ref 38–126)
BUN: 11 mg/dL (ref 6–20)
CHLORIDE: 101 mmol/L (ref 101–111)
CO2: 30 mmol/L (ref 22–32)
Calcium: 10.2 mg/dL (ref 8.9–10.3)
Creatinine, Ser: 0.8 mg/dL (ref 0.44–1.00)
GFR calc Af Amer: >60 mL/min (ref >60)
GFR calc non Af Amer: >60 mL/min (ref >60)
GLUCOSE: 79 mg/dL (ref 65–99)
POTASSIUM: 4.1 mmol/L (ref 3.5–5.1)
SODIUM: 137 mmol/L (ref 135–145)
Total Bilirubin: 0.3 mg/dL (ref 0.3–1.2)
Total Protein: 7.1 g/dL (ref 6.5–8.1)

## 2016-05-05 LAB — CBC
HCT: 38.7 % (ref 36.0–46.0)
HEMOGLOBIN: 12.2 g/dL (ref 12.0–15.0)
MCH: 28 pg (ref 26.0–34.0)
MCHC: 31.5 g/dL (ref 30.0–36.0)
MCV: 88.8 fL (ref 78.0–100.0)
PLATELETS: 265 10*3/uL (ref 150–400)
RBC: 4.36 MIL/uL (ref 3.87–5.11)
RDW: 14.4 % (ref 11.5–15.5)
WBC: 6.3 10*3/uL (ref 4.0–10.5)

## 2016-05-05 LAB — SURGICAL PCR SCREEN
MRSA, PCR: NEGATIVE
STAPHYLOCOCCUS AUREUS: NEGATIVE

## 2016-05-05 LAB — APTT: APTT: 34 s (ref 24–36)

## 2016-05-05 LAB — PROTIME-INR
INR: 1.01
Prothrombin Time: 13.3 seconds (ref 11.4–15.2)

## 2016-05-05 NOTE — Pre-Procedure Instructions (Signed)
    Obie Silos  05/05/2016    Your procedure is scheduled on Friday, January 12.  Report to [facility] Admitting at 8:00 AM               Your surgery or procedure is scheduled for 10:00 AM    Call this number if you have problems the morning of surgery: [phone]                For any other questions, please call [phone], Monday - Friday 8 AM - 4 PM.    Remember:  Do not eat food or drink liquids after midnight Thursday, January 11.  Take these medicines the morning of surgery with A SIP OF WATER : May take Tylenol if needed.                  1 Week prior to surgery STOP taking Aspirin , Aspirin Products (Goody Powder, Excedrin Migraine), Ibuprofen (Advil), Naproxen (Aleve), Vitaminss and Herbal Products (ie Fish Oil)   Do not wear jewelry, make-up or nail polish.  Do not wear lotions, powders, or perfumes, or deodorant.  Do not shave 48 hours prior to surgery.    Do not bring valuables to the hospital.  [facility] is not responsible for any belongings or valuables.  Contacts, dentures or bridgework may not be worn into surgery.  Leave your suitcase in the car.  After surgery it may be brought to your room.  For patients admitted to the hospital, discharge time will be determined by your treatment team.  Special instructions: Review  [facility] - Preparing For Surgery.  Review handouts that were given: [facility]- Preparing For Surgery and Patient Instructions for Mupirocin Application, Incentive Spirometry, Pain Booklet

## 2016-05-05 NOTE — Progress Notes (Addendum)
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 Patient is followed by Dr Corliss Skains for arthritis. Patient reported that she will speak with her Dr. I instructed patient to let nurse know what was decided when she arrives for surgery.  Mrs Miyazaki reports that she had a stress test - treadmill, around 4 years ago at [facility] in [place], [place]. Patient did not have any idea as to why it was done. I sent a fax requesting records. Patient reported that the the test was normal and she did not need to follow up with a cardiologist.

## 2016-05-11 DIAGNOSIS — Z9884 Bariatric surgery status: Secondary | ICD-10-CM | POA: Insufficient documentation

## 2016-05-11 DIAGNOSIS — M19072 Primary osteoarthritis, left ankle and foot: Secondary | ICD-10-CM

## 2016-05-11 DIAGNOSIS — M47816 Spondylosis without myelopathy or radiculopathy, lumbar region: Secondary | ICD-10-CM | POA: Insufficient documentation

## 2016-05-11 DIAGNOSIS — Z79899 Other long term (current) drug therapy: Secondary | ICD-10-CM | POA: Insufficient documentation

## 2016-05-11 DIAGNOSIS — M19071 Primary osteoarthritis, right ankle and foot: Secondary | ICD-10-CM | POA: Insufficient documentation

## 2016-05-11 DIAGNOSIS — Z96641 Presence of right artificial hip joint: Secondary | ICD-10-CM | POA: Insufficient documentation

## 2016-05-11 NOTE — Progress Notes (Signed)
Office Visit Note  Patient: Chelsea Callahan             Date of Birth: May 11, 1950           MRN: [id number]             PCP: No PCP Per Patient Referring: No ref. provider found Visit Date: 05/12/2016 Occupation: retired    Subjective:  Left knee pain   History of Present Illness: Chelsea Callahan is a 66 y.o. female with history of autoimmune disease osteoarthritis and disc disease. She states that she'll be having lumbar spine surgery by Dr. Ophelia Charter for spinal stenosis. She's been having pain in the back of her lateral lower extremities for the spinal stenosis. She's also having some discomfort in her left knee. She has bilateral total knee replacement. Raynauds only bothers her when she is exposed to cold weather. Been tolerating Plaquenil well. She denies any joint swelling.  Activities of Daily Living:  Patient reports morning stiffness for 10 minutes.   Patient Denies nocturnal pain.  Difficulty dressing/grooming: Denies Difficulty climbing stairs: Denies Difficulty getting out of chair: Denies Difficulty using hands for taps, buttons, cutlery, and/or writing: Denies   Review of Systems  Constitutional: Positive for weight gain. Negative for fatigue, night sweats, weight loss and weakness.  HENT: Negative for mouth sores, trouble swallowing, trouble swallowing, mouth dryness and nose dryness.   Eyes: Negative for pain, redness, visual disturbance and dryness.  Respiratory: Negative for cough, shortness of breath and difficulty breathing.   Cardiovascular: Negative for chest pain, palpitations, hypertension, irregular heartbeat and swelling in legs/feet.  Gastrointestinal: Negative for blood in stool, constipation and diarrhea.  Endocrine: Negative for increased urination.  Genitourinary: Negative for vaginal dryness.  Musculoskeletal: Positive for arthralgias, joint pain and morning stiffness. Negative for joint swelling, myalgias, muscle weakness, muscle tenderness and myalgias.    Skin: Positive for color change. Negative for rash, hair loss, skin tightness, ulcers and sensitivity to sunlight.  Allergic/Immunologic: Negative for susceptible to infections.  Neurological: Negative for dizziness, memory loss and night sweats.  Hematological: Negative for swollen glands.  Psychiatric/Behavioral: Negative for depressed mood and sleep disturbance. The patient is not nervous/anxious.     PMFS History:  Patient Active Problem List   Diagnosis Date Noted  . High risk medication use 05/11/2016  . History of total hip replacement, right 05/11/2016  . Primary osteoarthritis of both feet 05/11/2016  . 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  . 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  . Low back pain 03/09/2016  . Spinal stenosis of lumbar region 03/09/2016  . Osteoarthritis of right knee 05/27/2014  . History of total knee replacement, bilateral 05/23/2014  . Autoimmune disease (HCC) 09/24/2013  . Osteoarthritis of left knee 09/13/2013    Class: Diagnosis of    Past Medical History:  Diagnosis Date  . Anemia   . 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HCC)   . PONV (postoperative nausea and vomiting)   . Primary osteoarthritis of right knee   . RA (rheumatoid arthritis) (HCC)   . Raynaud disease   . Raynaud disease   . Spinal headache    c-section  . Vasculitis (HCC)    on prednisone -     Family History  Problem Relation Age of Onset  . Autoimmune disease Other   . Cancer - Other Other    Past Surgical History:  Procedure Laterality Date  . abdominal clip     to stomach post gastric bypass  . CESAREAN SECTION  x 2  . GASTRIC BYPASS    . HERNIA REPAIR     inverted hermia  . JOINT REPLACEMENT Right    hip  . KNEE ARTHROPLASTY Left 09/13/2013   Procedure: LEFT COMPUTER ASSISTED TOTAL KNEE ARTHROPLASTY;  Surgeon: Eldred Manges, MD;  Location: [facility] OR;  Service: Orthopedics;  Laterality: Left;  Left Total Knee Arthroplasty, Computer Assist  . KNEE ARTHROPLASTY Right  05/23/2014   Procedure: COMPUTER ASSISTED TOTAL KNEE ARTHROPLASTY;  Surgeon: Eldred Manges, MD;  Location: [facility] OR;  Service: Orthopedics;  Laterality: Right;  . TOTAL KNEE ARTHROPLASTY Right 05/22/2014   dr Ophelia Charter  . TUBAL LIGATION     Social History   Social History Narrative  . No narrative on file     Objective: Vital Signs: BP 130/64   Pulse 72   Resp 16   Ht 5\' 2"  (1.575 m)   Wt 172 lb (78 kg)   BMI 31.46 kg/m    Physical Exam  Constitutional: She is oriented to person, place, and time. She appears well-developed and well-nourished.  HENT:  Head: Normocephalic and atraumatic.  Eyes: Conjunctivae and EOM are normal.  Neck: Normal range of motion.  Cardiovascular: Normal rate, regular rhythm, normal heart sounds and intact distal pulses.   Pulmonary/Chest: Effort normal.  Crackles in bilateral lung bases  Abdominal: Soft. Bowel sounds are normal.  Lymphadenopathy:    She has no cervical adenopathy.  Neurological: She is alert and oriented to person, place, and time.  Skin: Skin is warm and dry. Capillary refill takes less than 2 seconds.  Psychiatric: She has a normal mood and affect. Her behavior is normal.  Nursing note and vitals reviewed.    Musculoskeletal Exam: C-spine, thoracic spine good range of motion she has limited painful range of motion of her lumbar spine. Hip joints knee joints ankles MTPs PIPs with good range of motion. She has limited range of motion of her right hip joint and limited extension of her right knee joint. She has bilateral total knee replacement which appears to be doing well. Ankle joints are good range of motion. No synovitis was noted.  CDAI Exam: No CDAI exam completed.    Investigation: Findings:  05/06/2015 CBC normal, CMP normal 11/05/2015 CBC hemoglobin 11.4, CMP normal, UA normal     Imaging: No results found.  Speciality Comments: No specialty comments available.    Procedures:  No procedures performed Allergies:  Ginger; Pork-derived products; and Shellfish allergy   Assessment / Plan:     Visit Diagnoses: Autoimmune disease (HCC) - Positive ANA 1:80 nucleolar, positive RF, negative CCP, anticardiolipin IgM 21, Raynauds, questionable history of vasculitis, ANCAnegative. She has no synovitis on examination. She has some crackles in the bases of her lungs today on exam. I'll obtain chest x-ray for evaluation.  High risk medication use - Plaquenil 200 mg 1 tablet a.m., half tablet p.m. -most recent labs were normal. She's been getting her eye exams. Plan: CBC with Differential/Platelet, COMPLETE METABOLIC PANEL WITH GFR, Urinalysis, Routine w reflex microscopic  History of total knee replacement, bilateral: Right knee joint has limited extension.  History of total hip replacement, right: She had discomfort with range of motion  Primary osteoarthritis of both feet: Proper fitting shoes were discussed.  DDD lumbar spine with spinal stenosis she is scheduled to have him are spine surgery  History of gastric bypass  Lung crackles - Plan: DG Chest 2 View    Orders: Orders Placed This Encounter  Procedures  .  DG Chest 2 View  . CBC with Differential/Platelet  . COMPLETE METABOLIC PANEL WITH GFR  . Urinalysis, Routine w reflex microscopic   No orders of the defined types were placed in this encounter.   Face-to-face time spent with patient was 30 minutes. 50% of time was spent in counseling and coordination of care.  Follow-Up Instructions: Return in about 5 months (around 10/10/2016) for Autoimmune disease.   Pollyann Savoy, MD  Note - This record has been created using Animal nutritionist.  Chart creation errors have been sought, but may not always  have been located. Such creation errors do not reflect on  the standard of medical care.

## 2016-05-12 ENCOUNTER — Ambulatory Visit (INDEPENDENT_AMBULATORY_CARE_PROVIDER_SITE_OTHER): Payer: Medicare Other | Admitting: Rheumatology

## 2016-05-12 ENCOUNTER — Ambulatory Visit (HOSPITAL_COMMUNITY)
Admission: RE | Admit: 2016-05-12 | Discharge: 2016-05-12 | Disposition: A | Discharge status: Home / Self Care | Payer: Medicare Other | Source: Ambulatory Visit | Attending: Rheumatology | Admitting: Rheumatology

## 2016-05-12 ENCOUNTER — Encounter: Payer: Self-pay | Admitting: Rheumatology

## 2016-05-12 VITALS — BP 130/64 | HR 72 | Resp 16 | Ht 62.0 in | Wt 172.0 lb

## 2016-05-12 DIAGNOSIS — R0989 Other specified symptoms and signs involving the circulatory and respiratory systems: Secondary | ICD-10-CM | POA: Diagnosis not present

## 2016-05-12 DIAGNOSIS — Z9889 Other specified postprocedural states: Secondary | ICD-10-CM

## 2016-05-12 DIAGNOSIS — I700 Atherosclerosis of aorta: Secondary | ICD-10-CM | POA: Diagnosis not present

## 2016-05-12 DIAGNOSIS — M47816 Spondylosis without myelopathy or radiculopathy, lumbar region: Secondary | ICD-10-CM | POA: Diagnosis not present

## 2016-05-12 DIAGNOSIS — Z79899 Other long term (current) drug therapy: Secondary | ICD-10-CM

## 2016-05-12 DIAGNOSIS — M359 Systemic involvement of connective tissue, unspecified: Secondary | ICD-10-CM

## 2016-05-12 DIAGNOSIS — Z96641 Presence of right artificial hip joint: Secondary | ICD-10-CM | POA: Diagnosis not present

## 2016-05-12 DIAGNOSIS — Z9884 Bariatric surgery status: Secondary | ICD-10-CM

## 2016-05-12 DIAGNOSIS — M19072 Primary osteoarthritis, left ankle and foot: Secondary | ICD-10-CM | POA: Diagnosis not present

## 2016-05-12 DIAGNOSIS — Z96653 Presence of artificial knee joint, bilateral: Secondary | ICD-10-CM | POA: Diagnosis not present

## 2016-05-12 DIAGNOSIS — M19071 Primary osteoarthritis, right ankle and foot: Secondary | ICD-10-CM

## 2016-05-12 NOTE — Patient Instructions (Signed)
Standing Labs We placed an order today for your standing lab work.    Please come back and get your standing labs in June 2018.   We have open lab Monday through Friday from 8:30-11:30 AM and 1:30-4 PM at the office of Dr. Shaili Deveshwar/Naitik Panwala, PA.   The office is located at [address], [address], [place], [place] [address] No appointment is necessary.   Labs are drawn by [facility].  You may receive a bill from [facility] for your lab work.     

## 2016-05-12 NOTE — Progress Notes (Signed)
Rheumatology Medication Review by a Pharmacist Does the patient feel that his/her medications are working for him/her?  Yes Has the patient been experiencing any side effects to the medications prescribed?  No Does the patient have any problems obtaining medications?  No  Issues to address at subsequent visits: None   Pharmacist comments:  Chelsea Callahan is a pleasant 66 yo F who presents for follow up of her auto-immune disease.  Patient is currently taking hydroxychloroquine 200 mg twice daily Monday through Friday.  She denies any questions or concerns regarding her medications.  Patient had CBC and CMP on 05/05/16 which was normal.  She will be due for standing labs again in June 2018.  Patient reports she had her hydroxychloroquine eye exam in December of 2017 with Dr. Dione Callahan.  She reports her eye exam was normal except for some development of cataracts.  Will request eye exam form from Dr. Laruth Callahan office.     Chelsea Callahan, Pharm.D., BCPS Clinical Pharmacist Pager: [phone] Phone: [phone] 05/12/2016 1:57 PM

## 2016-05-12 NOTE — Anesthesia Preprocedure Evaluation (Signed)
Anesthesia Evaluation  Patient identified by MRN, date of birth, ID band Patient awake    Reviewed: Allergy & Precautions, NPO status , Patient's Chart, lab work & pertinent test results, reviewed documented beta blocker date and time   History of Anesthesia Complications (+) PONV  Airway Mallampati: II   Neck ROM: Full    Dental  (+) Edentulous Upper, Dental Advisory Given   Pulmonary former smoker,    breath sounds clear to auscultation       Cardiovascular negative cardio ROS   Rhythm:Regular  EKG brady   Neuro/Psych    GI/Hepatic negative GI ROS, Neg liver ROS,   Endo/Other  negative endocrine ROS  Renal/GU negative Renal ROS     Musculoskeletal   Abdominal (+) + obese,   Peds  Hematology negative hematology ROS (+)   Anesthesia Other Findings Easy mask and DL   Reproductive/Obstetrics                             Anesthesia Physical  Anesthesia Plan  ASA: II  Anesthesia Plan: General   Post-op Pain Management:  Regional for Post-op pain   Induction: Intravenous  Airway Management Planned:   Additional Equipment:   Intra-op Plan:   Post-operative Plan: Extubation in OR  Informed Consent: I have reviewed the patients History and Physical, chart, labs and discussed the procedure including the risks, benefits and alternatives for the proposed anesthesia with the patient or authorized representative who has indicated his/her understanding and acceptance.     Plan Discussed with:   Anesthesia Plan Comments:         Anesthesia Quick Evaluation

## 2016-05-12 NOTE — H&P (Signed)
Chelsea Callahan is an 66 y.o. female.   Chief Complaint: low back pain and leg pain HPI: Patient with history of L4-5 stenosis and the above complaint scheduled to have surgical intervention 05/13/2016. Progressively worsening symptoms. Failed conservative treatment.  Past Medical History:  Diagnosis Date  . Anemia   . 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HCC)   . PONV (postoperative nausea and vomiting)   . Primary osteoarthritis of right knee   . RA (rheumatoid arthritis) (HCC)   . Raynaud disease   . Raynaud disease   . Spinal headache    c-section  . Vasculitis (HCC)    on prednisone -     Past Surgical History:  Procedure Laterality Date  . abdominal clip     to stomach post gastric bypass  . CESAREAN SECTION     x 2  . GASTRIC BYPASS    . HERNIA REPAIR     inverted hermia  . JOINT REPLACEMENT Right    hip  . KNEE ARTHROPLASTY Left 09/13/2013   Procedure: LEFT COMPUTER ASSISTED TOTAL KNEE ARTHROPLASTY;  Surgeon: Eldred Manges, MD;  Location: [facility] OR;  Service: Orthopedics;  Laterality: Left;  Left Total Knee Arthroplasty, Computer Assist  . KNEE ARTHROPLASTY Right 05/23/2014   Procedure: COMPUTER ASSISTED TOTAL KNEE ARTHROPLASTY;  Surgeon: Eldred Manges, MD;  Location: [facility] OR;  Service: Orthopedics;  Laterality: Right;  . TOTAL KNEE ARTHROPLASTY Right 05/22/2014   dr Ophelia Charter  . TUBAL LIGATION      Family History  Problem Relation Age of Onset  . Autoimmune disease Other   . Cancer - Other Other    Social History:  reports that she has quit smoking. Her smoking use included Cigarettes. She has never used smokeless tobacco. She reports that she drinks about 2.4 oz of alcohol per week . She reports that she does not use drugs.  Allergies:  Allergies  Allergen Reactions  . Ginger Swelling    Stomach swells  . Pork-Derived Products Other (See Comments)    UNSPECIFIED "PERSONAL REASONS"  . Shellfish Allergy Other (See Comments)    UNSPECIFIED "Personal reasons/dietary"    No  prescriptions prior to admission.    No results found for this or any previous visit (from the past 48 hour(s)). Dg Chest 2 View  Result Date: 05/12/2016 CLINICAL DATA:  Crackles in the lung bases. Preoperative for lumbar decompression. EXAM: CHEST  2 VIEW COMPARISON:  09/13/2013 FINDINGS: Atherosclerotic calcification of the aortic arch. Upper normal heart size. Lungs currently appear clear. No significant blunting of the costophrenic angles. Atherosclerotic calcification of the abdominal aorta. IMPRESSION: 1. The lungs appear clear. 2. Atherosclerotic aortic arch.  Upper normal heart size. Electronically Signed   By: Gaylyn Rong M.D.   On: 05/12/2016 16:36    Review of Systems  Constitutional: Negative.   HENT: Negative.   Eyes: Negative.   Respiratory: Negative.   Cardiovascular: Negative.   Gastrointestinal: Negative.   Genitourinary: Negative.   Musculoskeletal: Positive for back pain.  Skin: Negative.   Endo/Heme/Allergies: Negative.   Psychiatric/Behavioral: Negative.     There were no vitals taken for this visit. Physical Exam  Constitutional: She is oriented to person, place, and time. No distress.  HENT:  Head: Normocephalic and atraumatic.  Eyes: EOM are normal. Pupils are equal, round, and reactive to light.  Neck: Normal range of motion.  GI: She exhibits no distension.  Musculoskeletal: She exhibits tenderness.  Neurological: She is alert and oriented to person, place, and time.  Skin: Skin is warm and dry.  Psychiatric: She has a normal mood and affect.    Ortho Exam normal skin over the lumbar spine show scoliosis. Slight obliquity of the pelvis. Well-healed right total hip arthroplasty incision in total knee arthroplasty. Distal pulses are intact EHL anterior tib is intact negative straight leg raising. No pain with right left hip range of motion.  Study Result   CLINICAL DATA:  Right-sided chronic low back pain and bilateral leg pain, increasing.  Lumbar spondylolisthesis.  EXAM: MRI LUMBAR SPINE WITHOUT CONTRAST  TECHNIQUE: Multiplanar, multisequence MR imaging of the lumbar spine was performed. No intravenous contrast was administered.  COMPARISON:  Radiographs dated 01/26/2016  FINDINGS: Segmentation:  Normal.  Alignment: 5 mm spondylolisthesis at L4-5 due to severe bilateral facet arthritis.  Vertebrae:  Normal.  Conus medullaris: Extends to the L2 level and appears normal.  Paraspinal and other soft tissues: There are several slightly prominent lymph nodes in the left periaortic level at L2-3, the largest having a minimum diameter of 11.4 mm. 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  Disc levels:  T10-11 through T12-L1: Disc space narrowing. Small broad-based disc bulges with no neural impingement.  L1-2 and L2-3:  Normal.  L3-4: Left far lateral disc protrusion with a slight mass effect upon the left L3 nerve lateral to the neural foramen. Slight bilateral facet arthritis.  L4-5: Grade 1 spondylolisthesis with severe bilateral facet arthritis. Severe spinal stenosis with marked compression of both lateral recesses which should affect both L5 nerves, best seen on image 23 of series 7. Minimal broad-based bulge of the uncovered disc, most prominent into the right neural foramen. Moderately severe bilateral foraminal stenosis right worse than left. Slight must effect upon the right L4 nerve at the lateral aspect of the neural foramen, best seen on images 21 of series 6 and series 7.  L5-S1: Severe bilateral facet arthritis. Normal disc. Widely patent neural foramina.  IMPRESSION: 1. Severe spinal stenosis and severe right lateral recess compression at L4-5. 2. Disc protrusion in and lateral to the right neural foramen at L4-5 has a mass effect upon the L4 nerve. 3. Left far lateral disc protrusion at L3-4 has a mass effect upon the left L3  nerve. 4. Nonspecific slight left periaortic adenopathy.   Electronically Signed   By: Francene Boyers M.D.   On: 02/15/2016 15:06     Assessment/Plan L4-5 stenosis with low back pain and leg pain.  We'll proceed with L4-5 decompression is scheduled. Surgical procedure along with potential rehabilitation/recovery time discussed. All questions answered.  Zonia Kief, PA-C 05/12/2016, 4:41 PM

## 2016-05-13 ENCOUNTER — Encounter (HOSPITAL_COMMUNITY)
Admission: RE | Disposition: A | Discharge status: Home / Self Care | Payer: Self-pay | Source: Ambulatory Visit | Attending: Orthopaedic Surgery

## 2016-05-13 ENCOUNTER — Ambulatory Visit (HOSPITAL_COMMUNITY): Payer: Medicare Other | Admitting: Anesthesiology

## 2016-05-13 ENCOUNTER — Encounter (HOSPITAL_COMMUNITY): Payer: Self-pay | Admitting: *Deleted

## 2016-05-13 ENCOUNTER — Ambulatory Visit (HOSPITAL_COMMUNITY): Payer: Medicare Other

## 2016-05-13 ENCOUNTER — Observation Stay (HOSPITAL_COMMUNITY)
Admission: RE | Admit: 2016-05-13 | Discharge: 2016-05-14 | Disposition: A | Discharge status: Home / Self Care | Payer: Medicare Other | Source: Ambulatory Visit | Attending: Orthopaedic Surgery | Admitting: Orthopaedic Surgery

## 2016-05-13 DIAGNOSIS — I700 Atherosclerosis of aorta: Secondary | ICD-10-CM | POA: Diagnosis not present

## 2016-05-13 DIAGNOSIS — Z91013 Allergy to seafood: Secondary | ICD-10-CM | POA: Diagnosis not present

## 2016-05-13 DIAGNOSIS — G8929 Other chronic pain: Secondary | ICD-10-CM | POA: Insufficient documentation

## 2016-05-13 DIAGNOSIS — Z87891 Personal history of nicotine dependence: Secondary | ICD-10-CM | POA: Diagnosis not present

## 2016-05-13 DIAGNOSIS — Z8349 Family history of other endocrine, nutritional and metabolic diseases: Secondary | ICD-10-CM | POA: Diagnosis not present

## 2016-05-13 DIAGNOSIS — Z9884 Bariatric surgery status: Secondary | ICD-10-CM | POA: Insufficient documentation

## 2016-05-13 DIAGNOSIS — M4316 Spondylolisthesis, lumbar region: Secondary | ICD-10-CM | POA: Diagnosis not present

## 2016-05-13 DIAGNOSIS — M549 Dorsalgia, unspecified: Secondary | ICD-10-CM | POA: Insufficient documentation

## 2016-05-13 DIAGNOSIS — Z79899 Other long term (current) drug therapy: Secondary | ICD-10-CM | POA: Insufficient documentation

## 2016-05-13 DIAGNOSIS — Z91018 Allergy to other foods: Secondary | ICD-10-CM | POA: Diagnosis not present

## 2016-05-13 DIAGNOSIS — M069 Rheumatoid arthritis, unspecified: Secondary | ICD-10-CM | POA: Diagnosis not present

## 2016-05-13 DIAGNOSIS — M5126 Other intervertebral disc displacement, lumbar region: Secondary | ICD-10-CM | POA: Insufficient documentation

## 2016-05-13 DIAGNOSIS — Z96641 Presence of right artificial hip joint: Secondary | ICD-10-CM | POA: Insufficient documentation

## 2016-05-13 DIAGNOSIS — M48061 Spinal stenosis, lumbar region without neurogenic claudication: Secondary | ICD-10-CM | POA: Diagnosis present

## 2016-05-13 DIAGNOSIS — Z96653 Presence of artificial knee joint, bilateral: Secondary | ICD-10-CM | POA: Diagnosis not present

## 2016-05-13 DIAGNOSIS — Z981 Arthrodesis status: Secondary | ICD-10-CM | POA: Diagnosis not present

## 2016-05-13 DIAGNOSIS — Z419 Encounter for procedure for purposes other than remedying health state, unspecified: Secondary | ICD-10-CM

## 2016-05-13 DIAGNOSIS — Z79891 Long term (current) use of opiate analgesic: Secondary | ICD-10-CM | POA: Diagnosis not present

## 2016-05-13 DIAGNOSIS — M48062 Spinal stenosis, lumbar region with neurogenic claudication: Secondary | ICD-10-CM | POA: Diagnosis not present

## 2016-05-13 DIAGNOSIS — I7300 Raynaud's syndrome without gangrene: Secondary | ICD-10-CM | POA: Diagnosis not present

## 2016-05-13 DIAGNOSIS — Z9851 Tubal ligation status: Secondary | ICD-10-CM | POA: Diagnosis not present

## 2016-05-13 HISTORY — PX: LUMBAR LAMINECTOMY/DECOMPRESSION MICRODISCECTOMY: SHX5026

## 2016-05-13 SURGERY — LUMBAR LAMINECTOMY/DECOMPRESSION MICRODISCECTOMY
Anesthesia: General | Site: Spine Lumbar

## 2016-05-13 MED ORDER — PROBIOTIC PO CAPS: 1.0000 | ORAL_CAPSULE | Freq: Every day | ORAL | Status: DC

## 2016-05-13 MED ORDER — FENTANYL CITRATE (PF) 100 MCG/2ML IJ SOLN
INTRAMUSCULAR | Status: DC | PRN
  Administered 2016-05-13: 100 ug via INTRAVENOUS

## 2016-05-13 MED ORDER — CEFAZOLIN SODIUM-DEXTROSE 2-4 GM/100ML-% IV SOLN
2.0000 g | INTRAVENOUS | Status: AC
  Administered 2016-05-13: 2 g via INTRAVENOUS
  Filled 2016-05-13: qty 100

## 2016-05-13 MED ORDER — FENTANYL CITRATE (PF) 100 MCG/2ML IJ SOLN
25.0000 ug | INTRAMUSCULAR | Status: DC | PRN
  Administered 2016-05-13 (×2): 50 ug via INTRAVENOUS

## 2016-05-13 MED ORDER — RISAQUAD PO CAPS
1.0000 | ORAL_CAPSULE | Freq: Every day | ORAL | Status: DC
  Administered 2016-05-14: 1 via ORAL
  Filled 2016-05-13: qty 1

## 2016-05-13 MED ORDER — NEOSTIGMINE METHYLSULFATE 10 MG/10ML IV SOLN
INTRAVENOUS | Status: DC | PRN
  Administered 2016-05-13: 4 mg via INTRAVENOUS

## 2016-05-13 MED ORDER — DEXAMETHASONE SODIUM PHOSPHATE 10 MG/ML IJ SOLN
INTRAMUSCULAR | Status: DC | PRN
  Administered 2016-05-13: 4 mg via INTRAVENOUS

## 2016-05-13 MED ORDER — ONDANSETRON HCL 4 MG/2ML IJ SOLN
INTRAMUSCULAR | Status: DC | PRN
  Administered 2016-05-13: 4 mg via INTRAVENOUS

## 2016-05-13 MED ORDER — FENTANYL CITRATE (PF) 100 MCG/2ML IJ SOLN
INTRAMUSCULAR | Status: AC
  Filled 2016-05-13: qty 2

## 2016-05-13 MED ORDER — HYDROMORPHONE HCL 1 MG/ML IJ SOLN
INTRAMUSCULAR | Status: DC | PRN
  Administered 2016-05-13 (×4): .25 mg via INTRAVENOUS

## 2016-05-13 MED ORDER — SODIUM CHLORIDE 0.9 % IV SOLN: 250.0000 mL | INTRAVENOUS | Status: DC

## 2016-05-13 MED ORDER — SODIUM CHLORIDE 0.9 % IV SOLN
INTRAVENOUS | Status: DC
  Administered 2016-05-13: 15:00:00 via INTRAVENOUS

## 2016-05-13 MED ORDER — ONDANSETRON HCL 4 MG/2ML IJ SOLN
INTRAMUSCULAR | Status: AC
  Filled 2016-05-13: qty 2

## 2016-05-13 MED ORDER — METHOCARBAMOL 1000 MG/10ML IJ SOLN: 500.0000 mg | Freq: Four times a day (QID) | INTRAVENOUS | Status: DC | PRN

## 2016-05-13 MED ORDER — ROCURONIUM BROMIDE 50 MG/5ML IV SOSY
PREFILLED_SYRINGE | INTRAVENOUS | Status: AC
  Filled 2016-05-13: qty 5

## 2016-05-13 MED ORDER — PROPOFOL 10 MG/ML IV BOLUS
INTRAVENOUS | Status: AC
  Filled 2016-05-13: qty 20

## 2016-05-13 MED ORDER — HYDROXYCHLOROQUINE SULFATE 200 MG PO TABS
200.0000 mg | ORAL_TABLET | ORAL | Status: DC
  Administered 2016-05-13 (×2): 200 mg via ORAL
  Filled 2016-05-13 (×2): qty 1

## 2016-05-13 MED ORDER — SODIUM CHLORIDE 0.9% FLUSH: 3.0000 mL | INTRAVENOUS | Status: DC | PRN

## 2016-05-13 MED ORDER — LIDOCAINE 2% (20 MG/ML) 5 ML SYRINGE
INTRAMUSCULAR | Status: AC
  Filled 2016-05-13: qty 5

## 2016-05-13 MED ORDER — PROPOFOL 10 MG/ML IV BOLUS
INTRAVENOUS | Status: DC | PRN
  Administered 2016-05-13: 100 mg via INTRAVENOUS
  Administered 2016-05-13: 40 mg via INTRAVENOUS

## 2016-05-13 MED ORDER — ROCURONIUM BROMIDE 100 MG/10ML IV SOLN
INTRAVENOUS | Status: DC | PRN
  Administered 2016-05-13: 50 mg via INTRAVENOUS

## 2016-05-13 MED ORDER — HYDROMORPHONE HCL 2 MG/ML IJ SOLN: 0.5000 mg | INTRAMUSCULAR | Status: DC | PRN

## 2016-05-13 MED ORDER — GLYCOPYRROLATE 0.2 MG/ML IJ SOLN
INTRAMUSCULAR | Status: DC | PRN
Start: 2016-05-13 — End: 2016-05-13
  Administered 2016-05-13: 0.2 mg via INTRAVENOUS
  Administered 2016-05-13: 0.6 mg via INTRAVENOUS

## 2016-05-13 MED ORDER — METHOCARBAMOL 500 MG PO TABS: 500.0000 mg | ORAL_TABLET | Freq: Four times a day (QID) | ORAL | Status: DC | PRN

## 2016-05-13 MED ORDER — BUPIVACAINE HCL (PF) 0.25 % IJ SOLN
INTRAMUSCULAR | Status: DC | PRN
  Administered 2016-05-13: 10 mL

## 2016-05-13 MED ORDER — SUCCINYLCHOLINE CHLORIDE 200 MG/10ML IV SOSY
PREFILLED_SYRINGE | INTRAVENOUS | Status: AC
  Filled 2016-05-13: qty 10

## 2016-05-13 MED ORDER — ACETAMINOPHEN 325 MG PO TABS
650.0000 mg | ORAL_TABLET | ORAL | Status: DC | PRN
  Administered 2016-05-13 – 2016-05-14 (×3): 650 mg via ORAL
  Administered 2016-05-14: 325 mg via ORAL
  Filled 2016-05-13 (×4): qty 2

## 2016-05-13 MED ORDER — SODIUM CHLORIDE 0.9% FLUSH
3.0000 mL | Freq: Two times a day (BID) | INTRAVENOUS | Status: DC
  Administered 2016-05-13: 3 mL via INTRAVENOUS

## 2016-05-13 MED ORDER — NEOSTIGMINE METHYLSULFATE 5 MG/5ML IV SOSY
PREFILLED_SYRINGE | INTRAVENOUS | Status: AC
  Filled 2016-05-13: qty 5

## 2016-05-13 MED ORDER — DOCUSATE SODIUM 100 MG PO CAPS
100.0000 mg | ORAL_CAPSULE | Freq: Two times a day (BID) | ORAL | Status: DC
  Administered 2016-05-13 – 2016-05-14 (×3): 100 mg via ORAL
  Filled 2016-05-13 (×3): qty 1

## 2016-05-13 MED ORDER — HYDROMORPHONE HCL 1 MG/ML IJ SOLN
INTRAMUSCULAR | Status: AC
  Filled 2016-05-13: qty 1

## 2016-05-13 MED ORDER — KETOROLAC TROMETHAMINE 30 MG/ML IJ SOLN
INTRAMUSCULAR | Status: AC
  Filled 2016-05-13: qty 1

## 2016-05-13 MED ORDER — KETOROLAC TROMETHAMINE 15 MG/ML IJ SOLN
INTRAMUSCULAR | Status: DC | PRN
  Administered 2016-05-13: 15 mg via INTRAVENOUS

## 2016-05-13 MED ORDER — CHLORHEXIDINE GLUCONATE 4 % EX LIQD: 60.0000 mL | Freq: Once | CUTANEOUS | Status: DC

## 2016-05-13 MED ORDER — MIDAZOLAM HCL 2 MG/2ML IJ SOLN
INTRAMUSCULAR | Status: AC
  Filled 2016-05-13: qty 2

## 2016-05-13 MED ORDER — ONDANSETRON HCL 4 MG/2ML IJ SOLN: 4.0000 mg | INTRAMUSCULAR | Status: DC | PRN

## 2016-05-13 MED ORDER — OXYCODONE-ACETAMINOPHEN 5-325 MG PO TABS
1.0000 | ORAL_TABLET | Freq: Four times a day (QID) | ORAL | Status: DC | PRN
  Administered 2016-05-13 (×2): 2 via ORAL
  Administered 2016-05-14: 1 via ORAL
  Filled 2016-05-13: qty 2
  Filled 2016-05-13: qty 1
  Filled 2016-05-13: qty 2

## 2016-05-13 MED ORDER — PHENYLEPHRINE 40 MCG/ML (10ML) SYRINGE FOR IV PUSH (FOR BLOOD PRESSURE SUPPORT)
PREFILLED_SYRINGE | INTRAVENOUS | Status: AC
  Filled 2016-05-13: qty 10

## 2016-05-13 MED ORDER — OXYCODONE-ACETAMINOPHEN 5-325 MG PO TABS: 1.0000 | ORAL_TABLET | Freq: Four times a day (QID) | ORAL | 0 refills | Status: DC | PRN

## 2016-05-13 MED ORDER — MIDAZOLAM HCL 5 MG/5ML IJ SOLN
INTRAMUSCULAR | Status: DC | PRN
  Administered 2016-05-13: 2 mg via INTRAVENOUS

## 2016-05-13 MED ORDER — DEXAMETHASONE SODIUM PHOSPHATE 10 MG/ML IJ SOLN
INTRAMUSCULAR | Status: AC
  Filled 2016-05-13: qty 1

## 2016-05-13 MED ORDER — PHENOL 1.4 % MT LIQD: 1.0000 | OROMUCOSAL | Status: DC | PRN

## 2016-05-13 MED ORDER — PHENYLEPHRINE 40 MCG/ML (10ML) SYRINGE FOR IV PUSH (FOR BLOOD PRESSURE SUPPORT)
PREFILLED_SYRINGE | INTRAVENOUS | Status: DC | PRN
  Administered 2016-05-13: 80 ug via INTRAVENOUS

## 2016-05-13 MED ORDER — LIDOCAINE HCL (CARDIAC) 20 MG/ML IV SOLN
INTRAVENOUS | Status: DC | PRN
  Administered 2016-05-13: 50 mg via INTRAVENOUS

## 2016-05-13 MED ORDER — METHOCARBAMOL 500 MG PO TABS
500.0000 mg | ORAL_TABLET | Freq: Four times a day (QID) | ORAL | 0 refills | Status: DC | PRN
Start: 2016-05-13 — End: 2018-03-07

## 2016-05-13 MED ORDER — ACETAMINOPHEN 650 MG RE SUPP: 650.0000 mg | RECTAL | Status: DC | PRN

## 2016-05-13 MED ORDER — POLYETHYLENE GLYCOL 3350 17 G PO PACK: 17.0000 g | PACK | Freq: Every day | ORAL | Status: DC | PRN

## 2016-05-13 MED ORDER — MENTHOL 3 MG MT LOZG: 1.0000 | LOZENGE | OROMUCOSAL | Status: DC | PRN

## 2016-05-13 MED ORDER — LACTATED RINGERS IV SOLN
INTRAVENOUS | Status: DC | PRN
  Administered 2016-05-13 (×3): via INTRAVENOUS

## 2016-05-13 MED ORDER — CEFAZOLIN IN D5W 1 GM/50ML IV SOLN
1.0000 g | Freq: Three times a day (TID) | INTRAVENOUS | Status: AC
  Administered 2016-05-13 (×2): 1 g via INTRAVENOUS
  Filled 2016-05-13 (×2): qty 50

## 2016-05-13 MED ORDER — HEMOSTATIC AGENTS (NO CHARGE) OPTIME
TOPICAL | Status: DC | PRN
  Administered 2016-05-13: 1 via TOPICAL

## 2016-05-13 MED ORDER — 0.9 % SODIUM CHLORIDE (POUR BTL) OPTIME
TOPICAL | Status: DC | PRN
  Administered 2016-05-13: 1000 mL

## 2016-05-13 MED ORDER — BUPIVACAINE HCL (PF) 0.25 % IJ SOLN
INTRAMUSCULAR | Status: AC
  Filled 2016-05-13: qty 30

## 2016-05-13 SURGICAL SUPPLY — 49 items
BENZOIN TINCTURE PRP APPL 2/3 (GAUZE/BANDAGES/DRESSINGS) ×2 IMPLANT
BUR ROUND FLUTED 4 SOFT TCH (BURR) ×2 IMPLANT
CANISTER SUCT 3000ML PPV (MISCELLANEOUS) ×2 IMPLANT
CLSR STERI-STRIP ANTIMIC 1/2X4 (GAUZE/BANDAGES/DRESSINGS) ×2 IMPLANT
COVER SURGICAL LIGHT HANDLE (MISCELLANEOUS) ×2 IMPLANT
DECANTER SPIKE VIAL GLASS SM (MISCELLANEOUS) ×2 IMPLANT
DERMABOND ADVANCED (GAUZE/BANDAGES/DRESSINGS)
DERMABOND ADVANCED .7 DNX12 (GAUZE/BANDAGES/DRESSINGS) IMPLANT
DRAPE MICROSCOPE LEICA (MISCELLANEOUS) ×2 IMPLANT
DRAPE PROXIMA HALF (DRAPES) ×4 IMPLANT
DRAPE SURG 17X23 STRL (DRAPES) ×2 IMPLANT
DRSG MEPILEX BORDER 4X4 (GAUZE/BANDAGES/DRESSINGS) IMPLANT
DRSG MEPILEX BORDER 4X8 (GAUZE/BANDAGES/DRESSINGS) ×2 IMPLANT
DURAPREP 26ML APPLICATOR (WOUND CARE) ×2 IMPLANT
ELECT BLADE 4.0 EZ CLEAN MEGAD (MISCELLANEOUS) ×2
ELECT REM PT RETURN 9FT ADLT (ELECTROSURGICAL) ×2
ELECTRODE BLDE 4.0 EZ CLN MEGD (MISCELLANEOUS) ×1 IMPLANT
ELECTRODE REM PT RTRN 9FT ADLT (ELECTROSURGICAL) ×1 IMPLANT
GLOVE BIOGEL PI IND STRL 8 (GLOVE) ×2 IMPLANT
GLOVE BIOGEL PI INDICATOR 8 (GLOVE) ×2
GLOVE ORTHO TXT STRL SZ7.5 (GLOVE) ×4 IMPLANT
GOWN STRL REUS W/ TWL LRG LVL3 (GOWN DISPOSABLE) ×2 IMPLANT
GOWN STRL REUS W/ TWL XL LVL3 (GOWN DISPOSABLE) ×1 IMPLANT
GOWN STRL REUS W/TWL 2XL LVL3 (GOWN DISPOSABLE) ×2 IMPLANT
GOWN STRL REUS W/TWL LRG LVL3 (GOWN DISPOSABLE) ×2
GOWN STRL REUS W/TWL XL LVL3 (GOWN DISPOSABLE) ×1
KIT BASIN OR (CUSTOM PROCEDURE TRAY) ×2 IMPLANT
KIT ROOM TURNOVER OR (KITS) ×2 IMPLANT
MANIFOLD NEPTUNE II (INSTRUMENTS) IMPLANT
NEEDLE HYPO 25GX1X1/2 BEV (NEEDLE) ×2 IMPLANT
NEEDLE SPNL 18GX3.5 QUINCKE PK (NEEDLE) ×2 IMPLANT
NS IRRIG 1000ML POUR BTL (IV SOLUTION) ×2 IMPLANT
PACK LAMINECTOMY ORTHO (CUSTOM PROCEDURE TRAY) ×2 IMPLANT
PAD ARMBOARD 7.5X6 YLW CONV (MISCELLANEOUS) ×4 IMPLANT
PATTIES SURGICAL .5 X.5 (GAUZE/BANDAGES/DRESSINGS) IMPLANT
PATTIES SURGICAL .75X.75 (GAUZE/BANDAGES/DRESSINGS) IMPLANT
SURGIFLO W/THROMBIN 8M KIT (HEMOSTASIS) ×2 IMPLANT
SUT BONE WAX W31G (SUTURE) ×2 IMPLANT
SUT VIC AB 0 CT1 27 (SUTURE)
SUT VIC AB 0 CT1 27XBRD ANBCTR (SUTURE) IMPLANT
SUT VIC AB 1 CT1 27 (SUTURE)
SUT VIC AB 1 CT1 27XBRD ANBCTR (SUTURE) IMPLANT
SUT VIC AB 1 CTX 36 (SUTURE) ×1
SUT VIC AB 1 CTX36XBRD ANBCTR (SUTURE) ×1 IMPLANT
SUT VIC AB 2-0 CT1 27 (SUTURE) ×2
SUT VIC AB 2-0 CT1 TAPERPNT 27 (SUTURE) ×2 IMPLANT
SUT VIC AB 3-0 X1 27 (SUTURE) ×2 IMPLANT
TOWEL OR 17X24 6PK STRL BLUE (TOWEL DISPOSABLE) ×2 IMPLANT
TOWEL OR 17X26 10 PK STRL BLUE (TOWEL DISPOSABLE) ×2 IMPLANT

## 2016-05-13 NOTE — Anesthesia Procedure Notes (Addendum)
Procedure Name: Intubation Date/Time: 05/13/2016 7:35 AM Performed by: Marchelle Folks ANN Pre-anesthesia Checklist: Patient identified, Emergency Drugs available, Suction available, Patient being monitored and Timeout performed Patient Re-evaluated:Patient Re-evaluated prior to inductionOxygen Delivery Method: Circle system utilized Preoxygenation: Pre-oxygenation with 100% oxygen Intubation Type: IV induction and Cricoid Pressure applied Ventilation: Mask ventilation without difficulty Laryngoscope Size: Mac and 3 Grade View: Grade I Tube type: Oral Number of attempts: 1 Airway Equipment and Method: Stylet Placement Confirmation: ETT inserted through vocal cords under direct vision,  positive ETCO2 and breath sounds checked- equal and bilateral Secured at: 21 cm Tube secured with: Tape Dental Injury: Teeth and Oropharynx as per pre-operative assessment

## 2016-05-13 NOTE — Anesthesia Postprocedure Evaluation (Signed)
Anesthesia Post Note  Patient: Chelsea Callahan  Procedure(s) Performed: Procedure(s) (LRB): L4-5 Decompression (N/A)  Patient location during evaluation: PACU Anesthesia Type: General Level of consciousness: sedated Pain management: satisfactory to patient Vital Signs Assessment: post-procedure vital signs reviewed and stable Respiratory status: spontaneous breathing Cardiovascular status: stable Anesthetic complications: no       Last Vitals:  Vitals:   05/13/16 1122 05/13/16 1414  BP: 137/66 (!) 125/49  Pulse: (!) 58 66  Resp: 12 14  Temp: 36.5 C 36.8 C    Last Pain:  Vitals:   05/13/16 1414  TempSrc: Oral  PainSc:                  Jiles Garter

## 2016-05-13 NOTE — Transfer of Care (Signed)
Immediate Anesthesia Transfer of Care Note  Patient: Chelsea Callahan  Procedure(s) Performed: Procedure(s): L4-5 Decompression (N/A)  Patient Location: PACU  Anesthesia Type:General  Level of Consciousness: awake  Airway & Oxygen Therapy: Patient Spontanous Breathing and Patient connected to nasal cannula oxygen  Post-op Assessment: Report given to RN and Post -op Vital signs reviewed and stable  Post vital signs: Reviewed  Last Vitals: 143/78, 72, 12 95%   Vitals:   05/13/16 0618  BP: (!) 153/54  Pulse: 67  Resp: 18  Temp: 36.6 C    Last Pain:  Vitals:   05/13/16 0618  TempSrc: Oral      Patients Stated Pain Goal: 0 (05/13/16 0630)  Complications: No apparent anesthesia complications

## 2016-05-13 NOTE — Progress Notes (Signed)
Awakened slighly anxious "I can't breat".Aucultated all lung fields / ant/post and found to be cta. Offered sips water and given pain med for anxiety.

## 2016-05-13 NOTE — Interval H&P Note (Signed)
History and Physical Interval Note:  05/13/2016 7:18 AM  Chelsea Callahan  has presented today for surgery, with the diagnosis of L4-5 Stenosis  The various methods of treatment have been discussed with the patient and family. After consideration of risks, benefits and other options for treatment, the patient has consented to  Procedure(s): L4-5 Decompression (N/A) as a surgical intervention .  The patient's history has been reviewed, patient examined, no change in status, stable for surgery.  I have reviewed the patient's chart and labs.  Questions were answered to the patient's satisfaction.     Eldred Manges

## 2016-05-13 NOTE — Progress Notes (Signed)
Spoke with Dr Sherron Ales re: 2nd episode of pt stating " I can't breathe"/ lasted only less than 1 minute. He came by to eval and no Rx ordered. VSS/ pt returned to sleep

## 2016-05-13 NOTE — Discharge Instructions (Signed)
°  ORTHOPEDIC DISCHARGE INSTRUCTIONS   -Ok to shower 5 days postop.  Do not apply any creams or ointments to incision.  Do not remove steri-strips.  Can use 4x4 gauze and tape for dressing changes.  No aggressive activity.  No bending, twisting,squatting or prolonged sitting.  Mostly be in reclined position or lying down.    -No driving until further notice.  -If you have any increased pain, fever/chills or questions you should contact our office immediately.  Physician is available after hours.

## 2016-05-13 NOTE — Brief Op Note (Signed)
05/13/2016  9:31 AM  PATIENT:  Chelsea Callahan  66 y.o. female  PRE-OPERATIVE DIAGNOSIS:  L4-5 Stenosis  POST-OPERATIVE DIAGNOSIS:  L4-5 Stenosis  PROCEDURE:  Procedure(s): L4-5 Decompression (N/A)  SURGEON:  Surgeon(s) and Role:    * Eldred Manges, MD - Primary  PHYSICIAN ASSISTANT: Elmo Rio m.Iliana Hutt PA-C    ANESTHESIA:   general  EBL:  Total I/O In: 1000 [I.V.:1000] Out: 150 [Blood:150]  BLOOD ADMINISTERED:none  DRAINS: none   LOCAL MEDICATIONS USED:  MARCAINE     SPECIMEN:  No Specimen  DISPOSITION OF SPECIMEN:  N/A  COUNTS:  YES  TOURNIQUET:  * No tourniquets in log *  DICTATION: .Dragon Dictation  PLAN OF CARE: Admit for overnight observation  PATIENT DISPOSITION:  PACU - hemodynamically stable.

## 2016-05-13 NOTE — Op Note (Signed)
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  Postop diagnosis: Same  Procedure: L4 and L5 decompression, lateral recess decompression and foraminotomies bilateral  for spinal stenosis, microscope assisted. (2 segment procedure)  Surgeon: Annell Greening M.D.  Assistant: Zonia Kief PA-C medically necessary and present for the entire procedure  Anesthesia :Gen. plus Marcaine skin local  Complications: None  This 66 year old female has degenerative spondylolisthesis at L4-5 with compression, spinal stenosis and neurogenic claudication symptoms which are felt conservative treatment.  Procedure: After induction general anesthesia operculum intubation patient was placed prone on chest rolls careful padding and positioning leg pumpers preoperative antibiotics and timeout procedure. The back was prepped with DuraPrep there squared with towels Betadine Steri-Drape applied Ancef was given prophylactically.  Needle localization crosstable lateral with the needle at L4-5 space. Midline incision was made subperiosteal dissection out the lamina. There was a short distance between the facet joints with some congenital stenosis and the degenerative spondylolisthesis with multifactorial stenosis. Patient had overhanging facets causing severe lateral recess stenosis bilaterally. Preoperative x-rays showed although she had degenerative spondylolisthesis she was stable and not shifting more than 3 mm on flexion extension lateral bending films. The lamina of L4 was removed top portion of L5 was removed corresponding to the areas on the MRI where she had central stenosis. Upper microscope was draped and brought in. 4 mm fluted bur was used to thin the lamina. Overhanging spurs removed. There was severe lateral recess stenosis causing compression from each side of the dura and spurs were removed with 2 and 3 mm Kerrison Ronjair's with protection of the dura with  paddies and the dureko retractor. Portion the medial facet was removed on both sides and continue removal of hypertrophic thick chunks of ligament was performed until the dura was decompressed impactor around 2. Bone was removed out the level of the pedicle at L4 and L5. Careful palpation proximal and distal was performed to make sure that the decompression had been extended cephalad and caudad to correct the area of stenosis. Penfield four was placed cephalad and caudad and a third x-ray was taken. A second x-ray was taken with the Coker clamps placed at the top and bottom for plan decompression this was suggested repeat film taken so that we were exactly at the area for decompression. Once decompression was performed the final film was taken showing the 2 Penfield 4's at the base of the canal at the cephalad and caudad area of decompression. This showed the tip of the Penfield 4 was the bottom of the pedicle at L5 and the bottom of the pedicle at L4. Some Surgicel was placed in the lateral gutters the base was dry. There are some epidural veins but they did not require cauterization since they were not bleeding. Lateral gutters were run with the [facility] to make sure there are no spikes of bone and no remaining pieces of ligament. Some remaining small pieces of ligament were removed and both lateral gutters were smooth. Copious irrigation was used followed by layered closure #1 Vicryl in the fascia ~subtendinous tissue fatty layer and septic her skin closure. Dermabond Mark infiltration in postop dressing.

## 2016-05-14 ENCOUNTER — Encounter (HOSPITAL_COMMUNITY): Payer: Self-pay | Admitting: Orthopaedic Surgery

## 2016-05-14 DIAGNOSIS — Z96653 Presence of artificial knee joint, bilateral: Secondary | ICD-10-CM | POA: Diagnosis not present

## 2016-05-14 DIAGNOSIS — I7300 Raynaud's syndrome without gangrene: Secondary | ICD-10-CM | POA: Diagnosis not present

## 2016-05-14 DIAGNOSIS — M5126 Other intervertebral disc displacement, lumbar region: Secondary | ICD-10-CM | POA: Diagnosis not present

## 2016-05-14 DIAGNOSIS — M4316 Spondylolisthesis, lumbar region: Secondary | ICD-10-CM | POA: Diagnosis not present

## 2016-05-14 DIAGNOSIS — M069 Rheumatoid arthritis, unspecified: Secondary | ICD-10-CM | POA: Diagnosis not present

## 2016-05-14 DIAGNOSIS — M48062 Spinal stenosis, lumbar region with neurogenic claudication: Secondary | ICD-10-CM | POA: Diagnosis not present

## 2016-05-14 LAB — CBC
HEMATOCRIT: 31.4 % — AB (ref 36.0–46.0)
HEMOGLOBIN: 9.9 g/dL — AB (ref 12.0–15.0)
MCH: 27.6 pg (ref 26.0–34.0)
MCHC: 31.5 g/dL (ref 30.0–36.0)
MCV: 87.5 fL (ref 78.0–100.0)
Platelets: 228 10*3/uL (ref 150–400)
RBC: 3.59 MIL/uL — AB (ref 3.87–5.11)
RDW: 14.2 % (ref 11.5–15.5)
WBC: 6.4 10*3/uL (ref 4.0–10.5)

## 2016-05-14 LAB — BASIC METABOLIC PANEL
Anion gap: 5 (ref 5–15)
BUN: 9 mg/dL (ref 6–20)
CHLORIDE: 106 mmol/L (ref 101–111)
CO2: 27 mmol/L (ref 22–32)
CREATININE: 0.75 mg/dL (ref 0.44–1.00)
Calcium: 9.6 mg/dL (ref 8.9–10.3)
GFR calc Af Amer: >60 mL/min (ref >60)
GFR calc non Af Amer: >60 mL/min (ref >60)
GLUCOSE: 86 mg/dL (ref 65–99)
POTASSIUM: 3.8 mmol/L (ref 3.5–5.1)
SODIUM: 138 mmol/L (ref 135–145)

## 2016-05-14 MED ORDER — MELOXICAM 7.5 MG PO TABS
15.0000 mg | ORAL_TABLET | Freq: Every day | ORAL | Status: AC
  Administered 2016-05-14: 15 mg via ORAL
  Filled 2016-05-14: qty 2

## 2016-05-14 NOTE — Care Management Note (Signed)
66 yo F s/p L4 and L5 decompression, lateral recess decompression and foraminotomies bilateral  for spinal stenosis, microscope assisted. (2 segment procedure).  Received referral to assist with [facility] and DME.  Met with pt and husband. She plans to return home with the support of her husband. She has a walker. She denies any d/c needs.

## 2016-05-14 NOTE — Progress Notes (Signed)
Subjective: Patient stable.  She sitting in the chair.  She is comfortable but reports a headache which is not the spinal headache which she has had before.   Objective: Vital signs in last 24 hours: Temp:  [97.5 F (36.4 C)-98.6 F (37 C)] 97.9 F (36.6 C) (01/13 0530) Pulse Rate:  [54-72] 64 (01/13 0530) Resp:  [5-19] 14 (01/12 1414) BP: (111-164)/(42-78) 122/59 (01/13 0530) SpO2:  [91 %-100 %] 100 % (01/13 0530)  Intake/Output from previous day: 01/12 0701 - 01/13 0700 In: 1822.4 [P.O.:720; I.V.:1052.4; IV Piggyback:50] Out: 152 [Urine:2; Blood:150] Intake/Output this shift: No intake/output data recorded.  Exam:  Sensation intact distally Dorsiflexion/Plantar flexion intact  Labs:  Recent Labs  05/14/16 0544  HGB 9.9*    Recent Labs  05/14/16 0544  WBC 6.4  RBC 3.59*  HCT 31.4*  PLT 228    Recent Labs  05/14/16 0544  NA 138  K 3.8  CL 106  CO2 27  BUN 9  CREATININE 0.75  GLUCOSE 86  CALCIUM 9.6   No results for input(s): LABPT, INR in the last 72 hours.  Assessment/Plan: Plan is to change the dressing on her back incision to another Mepilex and discharge home this afternoon.  We will try 1 anti-inflammatory for the headache.   [name] Dean 05/14/2016, 9:13 AM

## 2016-05-16 NOTE — Discharge Summary (Signed)
Patient ID: Chelsea Callahan MRN: [id number] DOB/AGE: 65-May-1952 65 y.o.  Admit date: 05/13/2016 Discharge date: 05/16/2016  Admission Diagnoses:  Active Problems:   Spinal stenosis of lumbar region   Lumbar stenosis   Discharge Diagnoses:  Active Problems:   Spinal stenosis of lumbar region   Lumbar stenosis  status post Procedure(s): L4-5 Decompression  Past Medical History:  Diagnosis Date  . Anemia   . 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HCC)   . PONV (postoperative nausea and vomiting)   . Primary osteoarthritis of right knee   . RA (rheumatoid arthritis) (HCC)   . Raynaud disease   . Raynaud disease   . Spinal headache    c-section  . Vasculitis (HCC)    on prednisone -     Surgeries: Procedure(s): L4-5 Decompression on 05/13/2016   Consultants:   Discharged Condition: Improved  Hospital Course: Chelsea Callahan is an 66 y.o. female who was admitted 05/13/2016 for operative treatment of Lumbar stenosis. Patient failed conservative treatments (please see the history and physical for the specifics) and had severe unremitting pain that affects sleep, daily activities and work/hobbies. After pre-op clearance, the patient was taken to the operating room on 05/13/2016 and underwent  Procedure(s): L4-5 Decompression.    Patient was given perioperative antibiotics: Anti-infectives    Start     Dose/Rate Route Frequency Ordered Stop   05/13/16 1530  ceFAZolin (ANCEF) IVPB 1 g/50 mL premix     1 g 100 mL/hr over 30 Minutes Intravenous Every 8 hours 05/13/16 1122 05/14/16 0000   05/13/16 1130  hydroxychloroquine (PLAQUENIL) tablet 200 mg  Status:  Discontinued     200 mg Oral 2 times per day on Mon Tue Wed Thu Fri 05/13/16 1122 05/14/16 1812   05/13/16 0529  ceFAZolin (ANCEF) IVPB 2g/100 mL premix     2 g 200 mL/hr over 30 Minutes Intravenous On call to O.R. 05/13/16 [id number] 05/13/16 0727       Patient was given sequential compression devices and early ambulation to prevent DVT.    Patient benefited maximally from hospital stay and there were no complications. At the time of discharge, the patient was urinating/moving their bowels without difficulty, tolerating a regular diet, pain is controlled with oral pain medications and they have been cleared by PT/OT.   Recent vital signs: No data found.    Recent laboratory studies:  Recent Labs  05/14/16 0544  WBC 6.4  HGB 9.9*  HCT 31.4*  PLT 228  NA 138  K 3.8  CL 106  CO2 27  BUN 9  CREATININE 0.75  GLUCOSE 86  CALCIUM 9.6     Discharge Medications:   Allergies as of 05/14/2016      Reactions   Ginger Swelling   Stomach swells   Pork-derived Products Other (See Comments)   UNSPECIFIED "PERSONAL REASONS"   Shellfish Allergy Other (See Comments)   UNSPECIFIED "Personal reasons/dietary"      Medication List    STOP taking these medications   acetaminophen 650 MG CR tablet Commonly known as:  TYLENOL   TURMERIC PO     TAKE these medications   FISH OIL PO Take 1 capsule by mouth daily.   hydroxychloroquine 200 MG tablet Commonly known as:  PLAQUENIL TAKE 1 TABLET BY MOUTH 2 TIMES DAILY ON MONDAYS THRU FRIDAY ONLY   methocarbamol 500 MG tablet Commonly known as:  ROBAXIN Take 1 tablet (500 mg total) by mouth every 6 (six) hours as needed for muscle spasms.  multivitamin with minerals Tabs tablet Take 1 tablet by mouth daily.   oxyCODONE-acetaminophen 5-325 MG tablet Commonly known as:  ROXICET Take 1 tablet by mouth every 6 (six) hours as needed for severe pain.   Probiotic Caps Take 1 capsule by mouth daily.   VITAMIN D PO Take 1 capsule by mouth daily.       Diagnostic Studies: Dg Chest 2 View  Result Date: 05/12/2016 CLINICAL DATA:  Crackles in the lung bases. Preoperative for lumbar decompression. EXAM: CHEST  2 VIEW COMPARISON:  09/13/2013 FINDINGS: Atherosclerotic calcification of the aortic arch. Upper normal heart size. Lungs currently appear clear. No significant  blunting of the costophrenic angles. Atherosclerotic calcification of the abdominal aorta. IMPRESSION: 1. The lungs appear clear. 2. Atherosclerotic aortic arch.  Upper normal heart size. Electronically Signed   By: Gaylyn Rong M.D.   On: 05/12/2016 16:36   Dg Lumbar Spine Complete  Result Date: 05/13/2016 CLINICAL DATA:  L4-5 decompression EXAM: LUMBAR SPINE - COMPLETE 4+ VIEW COMPARISON:  02/15/2016 FINDINGS: On the initial picture, a needle projects over the L4 spinous process. On the second image, surgical instruments project over the L3 and L4 spinous processes. On the next image, surgical instruments project over the L4 and L5 spinous processes. On the final picture, surgical instruments project over the L4 and L5 pedicles. IMPRESSION: Intraoperative localization for L4-5 surgery. Electronically Signed   By: Jolaine Click M.D.   On: 05/13/2016 09:32    Discharge Instructions    Call MD / Call 911    Complete by:  As directed    If you experience chest pain or shortness of breath, CALL 911 and be transported to the hospital emergency room.  If you develope a fever above 101 F, pus (white drainage) or increased drainage or redness at the wound, or calf pain, call your surgeon's office.   Constipation Prevention    Complete by:  As directed    Drink plenty of fluids.  Prune juice may be helpful.  You may use a stool softener, such as Colace (over the counter) 100 mg twice a day.  Use MiraLax (over the counter) for constipation as needed.   Diet - low sodium heart healthy    Complete by:  As directed    Increase activity slowly as tolerated    Complete by:  As directed       Follow-up Information    Call Eldred Manges, MD.   Specialty:  Orthopedic Surgery Why:  need return office visit one week  Contact information: [address] [place] [address] [phone]           Discharge Plan:  discharge to Home  Disposition:     Signed: Zonia Kief for Dr.  Annell Greening [facility] orthopedics 05/16/2016, 3:46 PM

## 2016-06-08 ENCOUNTER — Encounter (INDEPENDENT_AMBULATORY_CARE_PROVIDER_SITE_OTHER): Payer: Self-pay | Admitting: Orthopaedic Surgery

## 2016-06-08 ENCOUNTER — Ambulatory Visit (INDEPENDENT_AMBULATORY_CARE_PROVIDER_SITE_OTHER): Payer: Medicare Other | Admitting: Orthopaedic Surgery

## 2016-06-08 ENCOUNTER — Telehealth: Payer: Self-pay | Admitting: Rheumatology

## 2016-06-08 VITALS — Ht 62.0 in | Wt 172.0 lb

## 2016-06-08 DIAGNOSIS — Z9889 Other specified postprocedural states: Secondary | ICD-10-CM

## 2016-06-08 NOTE — Telephone Encounter (Signed)
Patient states she there is a blood drive at [employer] and wants to know if there is a contraindication for her to donate blood.

## 2016-06-08 NOTE — Telephone Encounter (Signed)
I called the patient.There is no contraindication and patient given blood except she does have hemoglobin that's low at 9.9 on 05/14/2016 labs.Based on that fact, I've advised her against giving blood. Note: She has a history of anemia based on review of her previous CBC.

## 2016-06-08 NOTE — Telephone Encounter (Signed)
Patient has questions in regards to donating blood. Please call.

## 2016-06-08 NOTE — Progress Notes (Signed)
Office Visit Note   Patient: Chelsea Callahan           Date of Birth: Aug 04, 1950           MRN: [id number] Visit Date: 06/08/2016              Requested by: No referring provider defined for this encounter. PCP: No PCP Per Patient   Assessment & Plan: Visit Diagnoses:  Status post L4-5 decompression   Plan: Advised patient to gradually increase her walking distances over the next few weeks. Avoid bending twisting lifting. Follow-up in about 5 weeks for recheck. Returns sooner if needed.  Follow-Up Instructions: Return in about 5 weeks (around 07/13/2016).   Orders:  No orders of the defined types were placed in this encounter.  No orders of the defined types were placed in this encounter.     Procedures: No procedures performed   Clinical Data: No additional findings.   Subjective: Chief Complaint  Patient presents with  . Lower Back - Routine Post Op    L4 & L5 decompression, lateral recess decompression, & foraminotomies bilateral for spinal stenosis. 05/13/16 3 weeks 5 days post op.    Patient is following up L4 &5 decompression, lateral recess decompression & foraminotomies bilateral spinal stenosis. She is 3 weeks 5 days post op. She is doing well overall. She is no longer having to take pain medications. Patient incision is well intact and healed. Last steri strip removed. She does complain of weakness still and is wearing a brace on occasion.     Review of Systems  All other systems reviewed and are negative.    Objective: Vital Signs: Ht 5\' 2"  (1.575 m)   Wt 172 lb (78 kg)   BMI 31.46 kg/m   Physical Exam  Constitutional: She is oriented to person, place, and time. No distress.  HENT:  Head: Normocephalic.  Eyes: Pupils are equal, round, and reactive to light.  Neck: Normal range of motion.  Pulmonary/Chest: No respiratory distress.  Musculoskeletal:  Lumbar incision is well-healed. No signs of infection.  Neurological: She is alert and oriented to  person, place, and time.  Skin: Skin is warm and dry.  Psychiatric: She has a normal mood and affect.    Ortho Exam  Specialty Comments:  No specialty comments available.  Imaging: No results found.   PMFS History: Patient Active Problem List   Diagnosis Date Noted  . Lumbar stenosis 05/13/2016  . High risk medication use 05/11/2016  . History of total hip replacement, right 05/11/2016  . Primary osteoarthritis of both feet 05/11/2016  . 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  . 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  . Low back pain 03/09/2016  . Spinal stenosis of lumbar region 03/09/2016  . Osteoarthritis of right knee 05/27/2014  . History of total knee replacement, bilateral 05/23/2014  . Autoimmune disease (HCC) 09/24/2013  . Osteoarthritis of left knee 09/13/2013    Class: Diagnosis of   Past Medical History:  Diagnosis Date  . Anemia   . 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HCC)   . PONV (postoperative nausea and vomiting)   . Primary osteoarthritis of right knee   . RA (rheumatoid arthritis) (HCC)   . Raynaud disease   . Raynaud disease   . Spinal headache    c-section  . Vasculitis (HCC)    on prednisone -     Family History  Problem Relation Age of Onset  . Autoimmune disease Other   . Cancer - Other  Other     Past Surgical History:  Procedure Laterality Date  . abdominal clip     to stomach post gastric bypass  . CESAREAN SECTION     x 2  . GASTRIC BYPASS    . HERNIA REPAIR     inverted hermia  . JOINT REPLACEMENT Right    hip  . KNEE ARTHROPLASTY Left 09/13/2013   Procedure: LEFT COMPUTER ASSISTED TOTAL KNEE ARTHROPLASTY;  Surgeon: Eldred Manges, MD;  Location: [facility] OR;  Service: Orthopedics;  Laterality: Left;  Left Total Knee Arthroplasty, Computer Assist  . KNEE ARTHROPLASTY Right 05/23/2014   Procedure: COMPUTER ASSISTED TOTAL KNEE ARTHROPLASTY;  Surgeon: Eldred Manges, MD;  Location: [facility] OR;  Service: Orthopedics;  Laterality: Right;  . LUMBAR  LAMINECTOMY/DECOMPRESSION MICRODISCECTOMY N/A 05/13/2016   Procedure: L4-5 Decompression;  Surgeon: Eldred Manges, MD;  Location[facility];  Service: Orthopedics;  Laterality: N/A;  . TOTAL KNEE ARTHROPLASTY Right 05/22/2014   dr Ophelia Charter  . TUBAL LIGATION     Social History   Occupational History  . Not on file.   Social History Main Topics  . Smoking status: Former Smoker    Types: Cigarettes  . Smokeless tobacco: Never Used     Comment: Quit smoking cigarettes in 1999- light smoker  . Alcohol use 2.4 oz/week    4 Glasses of wine per week  . Drug use: No  . Sexual activity: Not on file

## 2016-06-28 DIAGNOSIS — Z1231 Encounter for screening mammogram for malignant neoplasm of breast: Secondary | ICD-10-CM | POA: Diagnosis not present

## 2016-06-28 DIAGNOSIS — Z0000 Encounter for general adult medical examination without abnormal findings: Secondary | ICD-10-CM | POA: Diagnosis not present

## 2016-06-28 DIAGNOSIS — Z1211 Encounter for screening for malignant neoplasm of colon: Secondary | ICD-10-CM | POA: Diagnosis not present

## 2016-06-28 DIAGNOSIS — Z2821 Immunization not carried out because of patient refusal: Secondary | ICD-10-CM | POA: Insufficient documentation

## 2016-06-28 DIAGNOSIS — Z1159 Encounter for screening for other viral diseases: Secondary | ICD-10-CM | POA: Diagnosis not present

## 2016-06-28 DIAGNOSIS — E2839 Other primary ovarian failure: Secondary | ICD-10-CM | POA: Diagnosis not present

## 2016-06-29 ENCOUNTER — Other Ambulatory Visit: Payer: Self-pay | Admitting: Rheumatology

## 2016-06-29 NOTE — Telephone Encounter (Signed)
ok 

## 2016-06-29 NOTE — Telephone Encounter (Signed)
Last Visit: 05/02/16 Next Visit: 10/10/16 Labs: 05/14/16 Hgb 9.9 PLQ Eye Exam: 04/07/16 WNL  Okay to refill PLQ?

## 2016-07-01 ENCOUNTER — Other Ambulatory Visit: Payer: Self-pay | Admitting: Family Medicine

## 2016-07-01 DIAGNOSIS — E2839 Other primary ovarian failure: Secondary | ICD-10-CM

## 2016-07-01 DIAGNOSIS — Z1231 Encounter for screening mammogram for malignant neoplasm of breast: Secondary | ICD-10-CM

## 2016-07-15 ENCOUNTER — Ambulatory Visit (INDEPENDENT_AMBULATORY_CARE_PROVIDER_SITE_OTHER): Payer: Medicare Other | Admitting: Orthopaedic Surgery

## 2016-07-15 ENCOUNTER — Encounter (INDEPENDENT_AMBULATORY_CARE_PROVIDER_SITE_OTHER): Payer: Self-pay | Admitting: Orthopaedic Surgery

## 2016-07-15 VITALS — BP 123/74 | HR 69 | Ht 62.0 in | Wt 175.0 lb

## 2016-07-15 DIAGNOSIS — M48062 Spinal stenosis, lumbar region with neurogenic claudication: Secondary | ICD-10-CM

## 2016-07-15 NOTE — Progress Notes (Signed)
Post-Op Visit Note   Patient: Chelsea Callahan           Date of Birth: 07-14-1950           MRN: [id number] Visit Date: 07/15/2016 PCP: No PCP Per Patient   Assessment & Plan:  Chief Complaint:  Chief Complaint  Patient presents with  . Lower Back - Routine Post Op   Visit Diagnoses:  1. Spinal stenosis of lumbar region with neurogenic claudication   Postop from L4-5 decompression 1/12 2018 with good relief of neurogenic claudication symptoms.  Plan: Patient's walking much further than she could get before the surgery. Incision is well-healed she's happy with the surgical result and we will check her back again as needed  Follow-Up Instructions: Return if symptoms worsen or fail to improve.   Orders:  No orders of the defined types were placed in this encounter.  No orders of the defined types were placed in this encounter.  HPI Patient returns for five week follow up. She is status post L4-5 decompression on 05/13/2016. She is 9 weeks post op. She states that she feels great and is having no problems.   Imaging: No results found.  PMFS History: Patient Active Problem List   Diagnosis Date Noted  . Lumbar stenosis 05/13/2016  . High risk medication use 05/11/2016  . History of total hip replacement, right 05/11/2016  . Primary osteoarthritis of both feet 05/11/2016  . 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  . 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  . Low back pain 03/09/2016  . Spinal stenosis of lumbar region 03/09/2016  . Osteoarthritis of right knee 05/27/2014  . History of total knee replacement, bilateral 05/23/2014  . Autoimmune disease (HCC) 09/24/2013  . Osteoarthritis of left knee 09/13/2013    Class: Diagnosis of   Past Medical History:  Diagnosis Date  . Anemia   . 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HCC)   . PONV (postoperative nausea and vomiting)   . Primary osteoarthritis of right knee   . RA (rheumatoid arthritis) (HCC)   . Raynaud disease   . Raynaud  disease   . Spinal headache    c-section  . Vasculitis (HCC)    on prednisone -     Family History  Problem Relation Age of Onset  . Autoimmune disease Other   . Cancer - Other Other     Past Surgical History:  Procedure Laterality Date  . abdominal clip     to stomach post gastric bypass  . CESAREAN SECTION     x 2  . GASTRIC BYPASS    . HERNIA REPAIR     inverted hermia  . JOINT REPLACEMENT Right    hip  . KNEE ARTHROPLASTY Left 09/13/2013   Procedure: LEFT COMPUTER ASSISTED TOTAL KNEE ARTHROPLASTY;  Surgeon: Eldred Manges, MD;  Location: [facility] OR;  Service: Orthopedics;  Laterality: Left;  Left Total Knee Arthroplasty, Computer Assist  . KNEE ARTHROPLASTY Right 05/23/2014   Procedure: COMPUTER ASSISTED TOTAL KNEE ARTHROPLASTY;  Surgeon: Eldred Manges, MD;  Location: [facility] OR;  Service: Orthopedics;  Laterality: Right;  . LUMBAR LAMINECTOMY/DECOMPRESSION MICRODISCECTOMY N/A 05/13/2016   Procedure: L4-5 Decompression;  Surgeon: Eldred Manges, MD;  Location: [facility] OR;  Service: Orthopedics;  Laterality: N/A;  . TOTAL KNEE ARTHROPLASTY Right 05/22/2014   dr Ophelia Charter  . TUBAL LIGATION     Social History   Occupational History  . Not on file.   Social History Main Topics  . Smoking status: Former Smoker  Types: Cigarettes  . Smokeless tobacco: Never Used     Comment: Quit smoking cigarettes in 1999- light smoker  . Alcohol use 2.4 oz/week    4 Glasses of wine per week  . Drug use: No  . Sexual activity: Not on file

## 2016-07-18 DIAGNOSIS — Z1212 Encounter for screening for malignant neoplasm of rectum: Secondary | ICD-10-CM | POA: Diagnosis not present

## 2016-07-18 DIAGNOSIS — Z1211 Encounter for screening for malignant neoplasm of colon: Secondary | ICD-10-CM | POA: Diagnosis not present

## 2016-07-22 ENCOUNTER — Ambulatory Visit
Admission: RE | Admit: 2016-07-22 | Discharge: 2016-07-22 | Disposition: A | Discharge status: Home / Self Care | Payer: Medicare Other | Source: Ambulatory Visit | Attending: Family Medicine | Admitting: Family Medicine

## 2016-07-22 DIAGNOSIS — Z1231 Encounter for screening mammogram for malignant neoplasm of breast: Secondary | ICD-10-CM

## 2016-07-22 DIAGNOSIS — E2839 Other primary ovarian failure: Secondary | ICD-10-CM

## 2016-07-22 DIAGNOSIS — M8589 Other specified disorders of bone density and structure, multiple sites: Secondary | ICD-10-CM | POA: Diagnosis not present

## 2016-08-17 DIAGNOSIS — L989 Disorder of the skin and subcutaneous tissue, unspecified: Secondary | ICD-10-CM | POA: Diagnosis not present

## 2016-08-25 ENCOUNTER — Other Ambulatory Visit: Payer: Self-pay | Admitting: Rheumatology

## 2016-08-25 NOTE — Telephone Encounter (Signed)
05/12/16 10/10/16 Labs 05/05/16 CBC CMP WNL  Normal eye exam 04/07/16 Ok to refill per Dr Corliss Skains

## 2016-10-10 ENCOUNTER — Ambulatory Visit: Payer: Medicare Other | Admitting: Rheumatology

## 2016-10-13 NOTE — Progress Notes (Signed)
Office Visit Note  Patient: Chelsea Callahan             Date of Birth: 22-Mar-1951           MRN: [id number]             PCP: Verlon Au, MD Referring: No ref. provider found Visit Date: 10/17/2016 Occupation: @GUAROCC @    Subjective:  Medication Management   History of Present Illness: Chelsea Callahan is a 66 y.o. female last seen by Dr. Corliss Skains on 05/12/2016 for autoimmune disease . Patient is on Plaquenil 200 mg twice a day Monday through Friday. Patient's last Plaquenil eye exam was December 2017 at Dr. Laruth Bouchard office.  Patient states her autoimmune disease is stable. No new rash. No additional fatigue. No oral or nasal ulcers. No joint pain, stiffness, swelling. Hx of raynauds --> no flare; usually flares w/ cold weather exposure.   Patient gets her bone density ordered by Dr. Virl Son. Patient reports that her bone density recently done showed some osteopenia and they wanted to treat with calcium, vitamin D, weightbearing exercise. Patient is following that advice currently. She'll coordinate DEXA  care through her PCP's office.   Activities of Daily Living:  Patient reports morning stiffness for 15 minutes.   Patient Denies nocturnal pain.  Difficulty dressing/grooming: Denies Difficulty climbing stairs: Denies Difficulty getting out of chair: Denies Difficulty using hands for taps, buttons, cutlery, and/or writing: Denies   Review of Systems  Constitutional: Negative for fatigue.  HENT: Negative for mouth sores and mouth dryness.   Eyes: Negative for dryness.  Respiratory: Negative for shortness of breath.   Gastrointestinal: Negative for constipation and diarrhea.  Musculoskeletal: Negative for myalgias and myalgias.  Skin: Negative for sensitivity to sunlight.  Psychiatric/Behavioral: Negative for decreased concentration and sleep disturbance.    PMFS History:  Patient Active Problem List   Diagnosis Date Noted  . Lumbar stenosis 05/13/2016  .  High risk medication use 05/11/2016  . History of total hip replacement, right 05/11/2016  . Primary osteoarthritis of both feet 05/11/2016  . 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  . 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  . Low back pain 03/09/2016  . Spinal stenosis of lumbar region 03/09/2016  . Osteoarthritis of right knee 05/27/2014  . History of total knee replacement, bilateral 05/23/2014  . Autoimmune disease (HCC) 09/24/2013  . Osteoarthritis of left knee 09/13/2013    Class: Diagnosis of    Past Medical History:  Diagnosis Date  . Anemia   . 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HCC)   . PONV (postoperative nausea and vomiting)   . Primary osteoarthritis of right knee   . RA (rheumatoid arthritis) (HCC)   . Raynaud disease   . Raynaud disease   . Spinal headache    c-section  . Vasculitis (HCC)    on prednisone -     Family History  Problem Relation Age of Onset  . Autoimmune disease Other   . Cancer - Other Other    Past Surgical History:  Procedure Laterality Date  . abdominal clip     to stomach post gastric bypass  . CESAREAN SECTION     x 2  . GASTRIC BYPASS    . HERNIA REPAIR     inverted hermia  . JOINT REPLACEMENT Right    hip  . KNEE ARTHROPLASTY Left 09/13/2013   Procedure: LEFT COMPUTER ASSISTED TOTAL KNEE ARTHROPLASTY;  Surgeon: Eldred Manges, MD;  Location: [facility] OR;  Service: Orthopedics;  Laterality: Left;  Left Total Knee Arthroplasty, Computer Assist  . KNEE ARTHROPLASTY Right 05/23/2014   Procedure: COMPUTER ASSISTED TOTAL KNEE ARTHROPLASTY;  Surgeon: Eldred Manges, MD;  Location: [facility] OR;  Service: Orthopedics;  Laterality: Right;  . LUMBAR LAMINECTOMY/DECOMPRESSION MICRODISCECTOMY N/A 05/13/2016   Procedure: L4-5 Decompression;  Surgeon: Eldred Manges, MD;  Location: [facility] OR;  Service: Orthopedics;  Laterality: N/A;  . TOTAL KNEE ARTHROPLASTY Right 05/22/2014   dr Ophelia Charter  . TUBAL LIGATION     Social History   Social History Narrative  . No narrative on  file     Objective: Vital Signs: BP 138/70   Pulse 74   Resp 14   Ht 5\' 2"  (1.575 m)   Wt 166 lb (75.3 kg)   BMI 30.36 kg/m    Physical Exam  Constitutional: She is oriented to person, place, and time. She appears well-developed and well-nourished.  HENT:  Head: Normocephalic and atraumatic.  Eyes: EOM are normal. Pupils are equal, round, and reactive to light.  Cardiovascular: Normal rate, regular rhythm and normal heart sounds.  Exam reveals no gallop and no friction rub.   No murmur heard. Pulmonary/Chest: Effort normal and breath sounds normal. She has no wheezes. She has no rales.  Abdominal: Soft. Bowel sounds are normal. She exhibits no distension. There is no tenderness. There is no guarding. No hernia.  Musculoskeletal: Normal range of motion. She exhibits no edema, tenderness or deformity.  Lymphadenopathy:    She has no cervical adenopathy.  Neurological: She is alert and oriented to person, place, and time. Coordination normal.  Skin: Skin is warm and dry. Capillary refill takes less than 2 seconds. No rash noted.  Psychiatric: She has a normal mood and affect. Her behavior is normal.  Nursing note and vitals reviewed.    Musculoskeletal Exam:  Full range of motion of all joints Grip strength is equal and strong bilaterally Fiber myalgia tender points are all absent  CDAI Exam: CDAI Homunculus Exam:   Joint Counts:  CDAI Tender Joint count: 0 CDAI Swollen Joint count: 0  Global Assessments:  Patient Global Assessment: 1 Provider Global Assessment: 1  CDAI Calculated Score: 2  No synovitis on exam  Investigation: No additional findings. CBC Latest Ref Rng & Units 05/14/2016 05/05/2016 05/27/2014  WBC 4.0 - 10.5 K/uL 6.4 6.3 -  Hemoglobin 12.0 - 15.0 g/dL 05/29/2014) 8.1(W 29.9)  Hematocrit 36.0 - 46.0 % 31.4(L) 38.7 30.7(L)  Platelets 150 - 400 K/uL 228 265 -   CMP Latest Ref Rng & Units 05/14/2016 05/05/2016 05/24/2014  Glucose 65 - 99 mg/dL 86 79 05/26/2014)    BUN 6 - 20 mg/dL 9 11 9   Creatinine 0.44 - 1.00 mg/dL 696(V 8.93  Sodium 135 - 145 mmol/L 138 137 136  Potassium 3.5 - 5.1 mmol/L 3.8 4.1 4.2  Chloride 101 - 111 mmol/L 106 101 102  CO2 22 - 32 mmol/L 27 30 23   Calcium 8.9 - 10.3 mg/dL 9.6 8.10 9.5  Total Protein 6.5 - 8.1 g/dL - 7.1 -  Total Bilirubin 0.3 - 1.2 mg/dL - 0.3 -  Alkaline Phos 38 - 126 U/L - 83 -  AST 15 - 41 U/L - 34 -  ALT 14 - 54 U/L - 27 -    Imaging: No results found.  Speciality Comments: No specialty comments available.    Procedures:  No procedures performed Allergies: Ginger; Pork-derived products; and Shellfish allergy   Assessment / Plan:  Visit Diagnoses: Autoimmune disease (HCC) -  Positive ANA 1:80 nucleolar, positive RF, negative CCP, anticardiolipin IgM 21, ANCA negative, history of Raynauds, questionable history of vasculitis - Plan: C3 and C4, Cardiolipin antibodies, IgG, IgM, IgA, Anti-DNA antibody, double-stranded  High risk medication use - PLQ 300 mg po qd - Plan: CBC with Differential/Platelet, COMPLETE METABOLIC PANEL WITH GFR, Urinalysis, Routine w reflex microscopic, C3 and C4, Cardiolipin antibodies, IgG, IgM, IgA, Anti-DNA antibody, double-stranded  History of total hip replacement, right  History of total knee replacement, bilateral  Primary osteoarthritis of both feet  DDD Lumbar spine with spinal stenosis - L4-L5 decompression 1/18 by Dr. Ophelia Charter  History of gastric bypass  Osteopenia of multiple sites - DXA 3/18   Plan: Autoimmune disease. History of positive ANA with a titer of 1:80 and nucleolar pattern; positive rheumatoid factor; negative CCP; negative double-stranded DNA; positive anticardiolipin IgM. History of Raynaud's. Currently no flare.  Today, patient reports no joint pain, stiffness, swelling; no oral or nasal ulcers; no new rash; no additional fatigue. Doing well with the autoimmune disease. Patient is compliant with her Plaquenil which she takes 200 mg  twice a day Monday through Friday only  #2: High risk prescription. Taking Plaquenil 200 twice a day Monday through Friday only; Plaquenil eye exam done through Dr. Laruth Bouchard office; last one was December 2017 and was negative.  We will update patient's labs today: EBC with differential, CMP with GFR, urinalysis C3-C4, double-stranded DNA, anticardiolipin.   #3: Patient gets her bone density ordered through her PCPs office. Recently was told that she had osteopenia in the 1 to manage that with weightbearing exercise, vitamin D, calcium. Please see Epic for full report; hears a summary of the assessment:===> ASSESSMENT: The BMD measured at Femur Neck is 0.848 g/cm2 with a T-score of -1.4. This patient is considered osteopenic according to World Health Organization [facility]) criteria. L-4 was excluded due to prior surgery. Right femur was excluded due to surgical hardware.  Site Region Measured Date Measured Age YA BMD Significant CHANGE T-score Left Femur Neck   07/22/[age]         -1.4    0.848 g/cm2  AP Spine   L1-L3  07/22/2016    66.1         -1.3    1.023 g/cm2  #4: Return to clinic in 5 months.  #5: On the last visit on 05/12/2016, patient had crackles on lungs during physical exam. Therefore chest x-ray was ordered. The chest x-ray was negative. Please see Epic for details; here is a summary of the impression:===> IMPRESSION: 1. The lungs appear clear. 2. Atherosclerotic aortic arch.  Upper normal heart size. On today's exam, there are no crackles on exam nor is the patient complaining of shortness of breath.   Orders: Orders Placed This Encounter  Procedures  . CBC with Differential/Platelet  . COMPLETE METABOLIC PANEL WITH GFR  . Urinalysis, Routine w reflex microscopic  . C3 and C4  . Cardiolipin antibodies, IgG, IgM, IgA  . Anti-DNA antibody, double-stranded   No orders of the defined types were placed in this encounter.  Face-to-face time spent with  patient was 30 minutes. 50% of time was spent in counseling and coordination of care.  Follow-Up Instructions: Return in about 5 months (around 03/19/2017) for A.D. ; plq 200 bid m-f; openia (pcp); autoimmune labs today; stable.   Tawni Pummel, PA-C  Note - This record has been created using [employer].  Chart creation  errors have been sought, but may not always  have been located. Such creation errors do not reflect on  the standard of medical care.

## 2016-10-17 ENCOUNTER — Ambulatory Visit (INDEPENDENT_AMBULATORY_CARE_PROVIDER_SITE_OTHER): Payer: Medicare Other | Admitting: Rheumatology

## 2016-10-17 ENCOUNTER — Encounter: Payer: Self-pay | Admitting: Rheumatology

## 2016-10-17 VITALS — BP 138/70 | HR 74 | Resp 14 | Ht 62.0 in | Wt 166.0 lb

## 2016-10-17 DIAGNOSIS — Z96641 Presence of right artificial hip joint: Secondary | ICD-10-CM | POA: Diagnosis not present

## 2016-10-17 DIAGNOSIS — M19072 Primary osteoarthritis, left ankle and foot: Secondary | ICD-10-CM

## 2016-10-17 DIAGNOSIS — Z96653 Presence of artificial knee joint, bilateral: Secondary | ICD-10-CM | POA: Diagnosis not present

## 2016-10-17 DIAGNOSIS — M47816 Spondylosis without myelopathy or radiculopathy, lumbar region: Secondary | ICD-10-CM | POA: Diagnosis not present

## 2016-10-17 DIAGNOSIS — Z79899 Other long term (current) drug therapy: Secondary | ICD-10-CM

## 2016-10-17 DIAGNOSIS — M19071 Primary osteoarthritis, right ankle and foot: Secondary | ICD-10-CM

## 2016-10-17 DIAGNOSIS — Z9884 Bariatric surgery status: Secondary | ICD-10-CM | POA: Diagnosis not present

## 2016-10-17 DIAGNOSIS — M8589 Other specified disorders of bone density and structure, multiple sites: Secondary | ICD-10-CM | POA: Diagnosis not present

## 2016-10-17 DIAGNOSIS — D8989 Other specified disorders involving the immune mechanism, not elsewhere classified: Secondary | ICD-10-CM | POA: Diagnosis not present

## 2016-10-17 DIAGNOSIS — M359 Systemic involvement of connective tissue, unspecified: Secondary | ICD-10-CM

## 2016-10-17 LAB — CBC WITH DIFFERENTIAL/PLATELET
BASOS ABS: 56 {cells}/uL (ref 0–200)
Basophils Relative: 1 %
EOS PCT: 3 %
Eosinophils Absolute: 168 cells/uL (ref 15–500)
HEMATOCRIT: 37.3 % (ref 35.0–45.0)
HEMOGLOBIN: 11.8 g/dL (ref 11.7–15.5)
LYMPHS ABS: 1400 {cells}/uL (ref 850–3900)
Lymphocytes Relative: 25 %
MCH: 27.1 pg (ref 27.0–33.0)
MCHC: 31.6 g/dL — AB (ref 32.0–36.0)
MCV: 85.6 fL (ref 80.0–100.0)
MPV: 9.7 fL (ref 7.5–12.5)
Monocytes Absolute: 336 cells/uL (ref 200–950)
Monocytes Relative: 6 %
NEUTROS ABS: 3640 {cells}/uL (ref 1500–7800)
NEUTROS PCT: 65 %
Platelets: 280 10*3/uL (ref 140–400)
RBC: 4.36 MIL/uL (ref 3.80–5.10)
RDW: 15.4 % — ABNORMAL HIGH (ref 11.0–15.0)
WBC: 5.6 10*3/uL (ref 3.8–10.8)

## 2016-10-18 LAB — COMPLETE METABOLIC PANEL WITH GFR
ALK PHOS: 80 U/L (ref 33–130)
ALT: 20 U/L (ref 6–29)
AST: 30 U/L (ref 10–35)
Albumin: 4.2 g/dL (ref 3.6–5.1)
BILIRUBIN TOTAL: 0.4 mg/dL (ref 0.2–1.2)
BUN: 13 mg/dL (ref 7–25)
CO2: 23 mmol/L (ref 20–31)
Calcium: 10.3 mg/dL (ref 8.6–10.4)
Chloride: 103 mmol/L (ref 98–110)
Creat: 0.61 mg/dL (ref 0.50–0.99)
GFR, Est African American: >89 mL/min (ref >=60)
GLUCOSE: 73 mg/dL (ref 65–99)
POTASSIUM: 4.7 mmol/L (ref 3.5–5.3)
SODIUM: 139 mmol/L (ref 135–146)
TOTAL PROTEIN: 6.5 g/dL (ref 6.1–8.1)

## 2016-10-18 LAB — CARDIOLIPIN ANTIBODIES, IGG, IGM, IGA
Anticardiolipin IgG: <14 [GPL'U]
Anticardiolipin IgM: <12 [MPL'U]

## 2016-10-18 LAB — URINALYSIS, MICROSCOPIC ONLY
BACTERIA UA: NONE SEEN [HPF]
CRYSTALS: NONE SEEN [HPF]
Casts: NONE SEEN [LPF]
RBC / HPF: NONE SEEN RBC/HPF (ref <=2)
Squamous Epithelial / LPF: NONE SEEN [HPF] (ref <=5)
Yeast: NONE SEEN [HPF]

## 2016-10-18 LAB — URINALYSIS, ROUTINE W REFLEX MICROSCOPIC
BILIRUBIN URINE: NEGATIVE
GLUCOSE, UA: NEGATIVE
HGB URINE DIPSTICK: NEGATIVE
Ketones, ur: NEGATIVE
Nitrite: NEGATIVE
PROTEIN: NEGATIVE
Specific Gravity, Urine: 1.016 (ref 1.001–1.035)
pH: 5.5 (ref 5.0–8.0)

## 2016-10-18 LAB — C3 AND C4
C3 COMPLEMENT: 112 mg/dL (ref 83–193)
C4 Complement: 22 mg/dL (ref 15–57)

## 2016-10-18 LAB — ANTI-DNA ANTIBODY, DOUBLE-STRANDED: ds DNA Ab: 1 IU/mL

## 2017-01-05 DIAGNOSIS — L603 Nail dystrophy: Secondary | ICD-10-CM | POA: Diagnosis not present

## 2017-01-05 DIAGNOSIS — L608 Other nail disorders: Secondary | ICD-10-CM | POA: Diagnosis not present

## 2017-03-02 NOTE — Progress Notes (Signed)
Office Visit Note  Patient: Chelsea Callahan             Date of Birth: May 14, 1950           MRN: [id number]             PCP: Verlon Au, MD Referring: Verlon Au, MD Visit Date: 03/14/2017 Occupation: @GUAROCC @    Subjective:  Medication management   History of Present Illness: Kaylea Genske is a 66 y.o. female with history of autoimmune disease. She states she's been doing well and tolerating the medication well. Her Raynauds is not very active yet. She is not had any recent rash. Her right total hip replacement and bilateral total knee replacements has been doing well. She's using some orthotics from [employer] and is been helpful. Lower back is much better after the surgery.   Activities of Daily Living:  Patient reports morning stiffness forminutes.  5 Patient Denies nocturnal pain.  Difficulty dressing/grooming: Denies Difficulty climbing stairs: Denies Difficulty getting out of chair: Denies Difficulty using hands for taps, buttons, cutlery, and/or writing: Denies   Review of Systems  Constitutional: Negative for fatigue, night sweats, weight gain, weight loss and weakness.  HENT: Negative for mouth sores, trouble swallowing, trouble swallowing, mouth dryness and nose dryness.   Eyes: Negative for pain, redness, visual disturbance and dryness.  Respiratory: Negative for cough, shortness of breath and difficulty breathing.   Cardiovascular: Negative for chest pain, palpitations, hypertension, irregular heartbeat and swelling in legs/feet.  Gastrointestinal: Negative for blood in stool, constipation and diarrhea.  Endocrine: Negative for increased urination.  Genitourinary: Negative for vaginal dryness.  Musculoskeletal: Positive for arthralgias, joint pain and morning stiffness. Negative for joint swelling, myalgias, muscle weakness, muscle tenderness and myalgias.  Skin: Negative for color change, rash, hair loss, skin tightness, ulcers and sensitivity to  sunlight.  Allergic/Immunologic: Negative for susceptible to infections.  Neurological: Negative for dizziness, memory loss and night sweats.  Hematological: Negative for swollen glands.  Psychiatric/Behavioral: Negative for depressed mood and sleep disturbance. The patient is not nervous/anxious.     PMFS History:  Patient Active Problem List   Diagnosis Date Noted  . Lumbar stenosis 05/13/2016  . High risk medication use 05/11/2016  . History of total hip replacement, right 05/11/2016  . Primary osteoarthritis of both feet 05/11/2016  . 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  . 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  . Low back pain 03/09/2016  . Spinal stenosis of lumbar region 03/09/2016  . Osteoarthritis of right knee 05/27/2014  . History of total knee replacement, bilateral 05/23/2014  . Autoimmune disease (HCC) 09/24/2013  . Osteoarthritis of left knee 09/13/2013    Class: Diagnosis of    Past Medical History:  Diagnosis Date  . Anemia   . 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HCC)   . PONV (postoperative nausea and vomiting)   . Primary osteoarthritis of right knee   . RA (rheumatoid arthritis) (HCC)   . Raynaud disease   . Raynaud disease   . Spinal headache    c-section  . Vasculitis (HCC)    on prednisone -     Family History  Problem Relation Age of Onset  . Cancer Brother   . Autoimmune disease Other   . Cancer - Other Other    Past Surgical History:  Procedure Laterality Date  . abdominal clip     to stomach post gastric bypass  . CESAREAN SECTION     x 2  . GASTRIC  BYPASS    . HERNIA REPAIR     inverted hermia  . JOINT REPLACEMENT Right    hip  . JOINT REPLACEMENT Bilateral    knee   . TOTAL KNEE ARTHROPLASTY Right 05/22/2014   dr Ophelia Charter  . TUBAL LIGATION     Social History   Social History Narrative  . Not on file     Objective: Vital Signs: BP 123/67 (BP Location: Left Arm, Patient Position: Sitting, Cuff Size: Normal)   Pulse 67   Resp 18    Ht 5\' 2"  (1.575 m)   Wt 176 lb (79.8 kg)   BMI 32.19 kg/m    Physical Exam  Constitutional: She is oriented to person, place, and time. She appears well-developed and well-nourished.  HENT:  Head: Normocephalic and atraumatic.  Eyes: Conjunctivae and EOM are normal.  Neck: Normal range of motion.  Cardiovascular: Normal rate, regular rhythm, normal heart sounds and intact distal pulses.  Pulmonary/Chest: Effort normal and breath sounds normal.  Abdominal: Soft. Bowel sounds are normal.  Lymphadenopathy:    She has no cervical adenopathy.  Neurological: She is alert and oriented to person, place, and time.  Skin: Skin is warm and dry. Capillary refill takes less than 2 seconds.  Psychiatric: She has a normal mood and affect. Her behavior is normal.  Nursing note and vitals reviewed.    Musculoskeletal Exam: C-spine and thoracic lumbar spine good range of motion. Shoulder joints elbow joints wrist joints MCPs PIPs DIPs with good range of motion with no synovitis. Her right total hip replacement and bilateral total knee replacements are doing well with no synovitis. She does not have much discomfort in her lower back.  CDAI Exam: CDAI Homunculus Exam:   Tenderness:  RLE: tibiofemoral LLE: tibiofemoral  Joint Counts:  CDAI Tender Joint count: 2 CDAI Swollen Joint count: 0  Global Assessments:  Patient Global Assessment: 5 Provider Global Assessment: 2  CDAI Calculated Score: 9    Investigation: No additional findings.PLQ eye exam: 04/07/2016 CBC Latest Ref Rng & Units 10/17/2016 05/14/2016 05/05/2016  WBC 3.8 - 10.8 K/uL 5.6 6.4 6.3  Hemoglobin 11.7 - 15.5 g/dL 07/03/2016 41.3) 2.4(M  Hematocrit 35.0 - 45.0 % 37.3 31.4(L) 38.7  Platelets 140 - 400 K/uL 280 228 265   CMP Latest Ref Rng & Units 10/17/2016 05/14/2016 05/05/2016  Glucose 65 - 99 mg/dL 73 86 79  BUN 7 - 25 mg/dL 13 9 11   Creatinine 0.50 - 0.99 mg/dL 07/03/2016 2.72  Sodium 135 - 146 mmol/L 139 138 137  Potassium 3.5  - 5.3 mmol/L 4.7 3.8 4.1  Chloride 98 - 110 mmol/L 103 106 101  CO2 20 - 31 mmol/L 23 27 30   Calcium 8.6 - 10.4 mg/dL 5.36 9.6 6.44  Total Protein 6.1 - 8.1 g/dL 6.5 - 7.1  Total Bilirubin 0.2 - 1.2 mg/dL 0.4 - 0.3  Alkaline Phos 33 - 130 U/L 80 - 83  AST 10 - 35 U/L 30 - 34  ALT 6 - 29 U/L 20 - 27    Imaging: No results found.  Speciality Comments: No specialty comments available.    Procedures:  No procedures performed Allergies: Ginger; Pork-derived products; and Shellfish allergy   Assessment / Plan:     Visit Diagnoses: Autoimmune disease (HCC) - Positive ANA 1:80 nucleolar, positive RF, negative CCP, anticardiolipin IgM 21, Raynauds, questionable history of vasculitis,patient is clinically doing well on Plaquenil. Raynolds is currently not active. Warm clothing during the winter months was  discussed.   High risk medication use - Plaquenil 200 mg 1 tablet  po bid M-FPLQ eye exam:is due in December.  - Plan: CBC with Differential/Platelet, Urinalysis, Routine w reflex microscopic, COMPLETE METABOLIC PANEL WITH GFRtoday.  History of total hip replacement, right: Doing well  History of total knee replacement, bilateral - Right knee joint has limited extension: She does not have much discomfort with range of motion although she does have discomfort with mobility. Weight loss diet and exercise was discussed. She's been active and exercising.  Primary osteoarthritis of both feet: New orthotics have been helpful.  DDD (degenerative disc disease), lumbar - her lower back pain is much better after number spine surgery.  History of gastric bypass  High risk medication use - Plaquenil 200 mg 1 tablet a.m., half tablet p.m. - Plan: CBC with Differential/Platelet, Urinalysis, Routine w reflex microscopic, COMPLETE METABOLIC PANEL WITH GFR    Orders: No orders of the defined types were placed in this encounter.  No orders of the defined types were placed in this  encounter.     Follow-Up Instructions: Return in about 5 months (around 08/12/2017) for Autoimmune disease, Osteoarthritis.   Pollyann Savoy, MD  Note - This record has been created using [employer].  Chart creation errors have been sought, but may not always  have been located. Such creation errors do not reflect on  the standard of medical care.

## 2017-03-14 ENCOUNTER — Ambulatory Visit (INDEPENDENT_AMBULATORY_CARE_PROVIDER_SITE_OTHER): Payer: Medicare Other | Admitting: Rheumatology

## 2017-03-14 ENCOUNTER — Encounter: Payer: Self-pay | Admitting: Rheumatology

## 2017-03-14 VITALS — BP 123/67 | HR 67 | Resp 18 | Ht 62.0 in | Wt 176.0 lb

## 2017-03-14 DIAGNOSIS — Z79899 Other long term (current) drug therapy: Secondary | ICD-10-CM | POA: Diagnosis not present

## 2017-03-14 DIAGNOSIS — M5136 Other intervertebral disc degeneration, lumbar region: Secondary | ICD-10-CM | POA: Diagnosis not present

## 2017-03-14 DIAGNOSIS — M19071 Primary osteoarthritis, right ankle and foot: Secondary | ICD-10-CM | POA: Diagnosis not present

## 2017-03-14 DIAGNOSIS — M19072 Primary osteoarthritis, left ankle and foot: Secondary | ICD-10-CM

## 2017-03-14 DIAGNOSIS — Z9884 Bariatric surgery status: Secondary | ICD-10-CM | POA: Diagnosis not present

## 2017-03-14 DIAGNOSIS — Z96653 Presence of artificial knee joint, bilateral: Secondary | ICD-10-CM | POA: Diagnosis not present

## 2017-03-14 DIAGNOSIS — Z96641 Presence of right artificial hip joint: Secondary | ICD-10-CM | POA: Diagnosis not present

## 2017-03-14 DIAGNOSIS — D8989 Other specified disorders involving the immune mechanism, not elsewhere classified: Secondary | ICD-10-CM

## 2017-03-14 DIAGNOSIS — M359 Systemic involvement of connective tissue, unspecified: Secondary | ICD-10-CM

## 2017-03-15 LAB — CBC WITH DIFFERENTIAL/PLATELET
BASOS ABS: 61 {cells}/uL (ref 0–200)
BASOS PCT: 1.1 %
EOS PCT: 4.2 %
Eosinophils Absolute: 231 cells/uL (ref 15–500)
HCT: 35.8 % (ref 35.0–45.0)
HEMOGLOBIN: 11.7 g/dL (ref 11.7–15.5)
LYMPHS ABS: 1194 {cells}/uL (ref 850–3900)
MCH: 27.5 pg (ref 27.0–33.0)
MCHC: 32.7 g/dL (ref 32.0–36.0)
MCV: 84.2 fL (ref 80.0–100.0)
MONOS PCT: 8.7 %
MPV: 10.6 fL (ref 7.5–12.5)
NEUTROS ABS: 3537 {cells}/uL (ref 1500–7800)
Neutrophils Relative %: 64.3 %
Platelets: 268 10*3/uL (ref 140–400)
RBC: 4.25 10*6/uL (ref 3.80–5.10)
RDW: 13.8 % (ref 11.0–15.0)
Total Lymphocyte: 21.7 %
WBC mixed population: 479 cells/uL (ref 200–950)
WBC: 5.5 10*3/uL (ref 3.8–10.8)

## 2017-03-15 LAB — COMPLETE METABOLIC PANEL WITH GFR
AG Ratio: 1.6 (calc) (ref 1.0–2.5)
ALBUMIN MSPROF: 3.9 g/dL (ref 3.6–5.1)
ALKALINE PHOSPHATASE (APISO): 74 U/L (ref 33–130)
ALT: 17 U/L (ref 6–29)
AST: 27 U/L (ref 10–35)
BUN: 13 mg/dL (ref 7–25)
CALCIUM: 9.7 mg/dL (ref 8.6–10.4)
CO2: 31 mmol/L (ref 20–32)
Chloride: 103 mmol/L (ref 98–110)
Creat: 0.64 mg/dL (ref 0.50–0.99)
GFR, EST NON AFRICAN AMERICAN: 94 mL/min/{1.73_m2} (ref >=60)
GFR, Est African American: 109 mL/min/{1.73_m2} (ref >=60)
GLUCOSE: 68 mg/dL (ref 65–99)
Globulin: 2.5 g/dL (calc) (ref 1.9–3.7)
Potassium: 4.7 mmol/L (ref 3.5–5.3)
Sodium: 138 mmol/L (ref 135–146)
Total Bilirubin: 0.5 mg/dL (ref 0.2–1.2)
Total Protein: 6.4 g/dL (ref 6.1–8.1)

## 2017-03-15 LAB — URINALYSIS, ROUTINE W REFLEX MICROSCOPIC
BILIRUBIN URINE: NEGATIVE
GLUCOSE, UA: NEGATIVE
Hgb urine dipstick: NEGATIVE
KETONES UR: NEGATIVE
Leukocytes, UA: NEGATIVE
Nitrite: NEGATIVE
PROTEIN: NEGATIVE
Specific Gravity, Urine: 1.02 (ref 1.001–1.03)
pH: <=5 (ref 5.0–8.0)

## 2017-03-15 NOTE — Progress Notes (Signed)
Labs are stable.

## 2017-05-16 DIAGNOSIS — Z79899 Other long term (current) drug therapy: Secondary | ICD-10-CM | POA: Diagnosis not present

## 2017-05-16 DIAGNOSIS — H2513 Age-related nuclear cataract, bilateral: Secondary | ICD-10-CM | POA: Diagnosis not present

## 2017-05-16 DIAGNOSIS — M069 Rheumatoid arthritis, unspecified: Secondary | ICD-10-CM | POA: Diagnosis not present

## 2017-05-16 DIAGNOSIS — H04123 Dry eye syndrome of bilateral lacrimal glands: Secondary | ICD-10-CM | POA: Diagnosis not present

## 2017-05-31 ENCOUNTER — Other Ambulatory Visit: Payer: Self-pay | Admitting: *Deleted

## 2017-05-31 MED ORDER — HYDROXYCHLOROQUINE SULFATE 200 MG PO TABS: ORAL_TABLET | ORAL | 1 refills | Status: DC

## 2017-05-31 NOTE — Telephone Encounter (Signed)
Refill request refill received vis fax  Last Visit: 03/14/17 Next visit: 08/17/17 Labs: 03/14/17 Stable PLQ Eye Exam: 05/16/2017 normal  Okay to refill per Dr. Corliss Skains

## 2017-08-03 NOTE — Progress Notes (Signed)
Office Visit Note  Patient: Chelsea Callahan             Date of Birth: 11/09/1950           MRN: [id number]             PCP: Verlon Au, MD Referring: Verlon Au, MD Visit Date: 08/17/2017 Occupation: @GUAROCC @    Subjective:  Medication Management   History of Present Illness: Chelsea Callahan is a 67 y.o. female with history of autoimmune disease and osteoarthritis.  She states she has been doing well.  Her both knee which are replaced are doing okay.  She is not having much discomfort in her feet.  She denies any rash.  With the warmer weather she is not having much discomfort with raynauds phenomenon.  She denies any discomfort in her lower back since the surgery.  Activities of Daily Living:  Patient reports morning stiffness for 0 minute.   Patient Denies nocturnal pain.  Difficulty dressing/grooming: Denies Difficulty climbing stairs: Denies Difficulty getting out of chair: Denies Difficulty using hands for taps, buttons, cutlery, and/or writing: Denies   Review of Systems  Constitutional: Negative for fatigue, night sweats, weight gain and weight loss.  HENT: Negative for mouth sores, trouble swallowing, trouble swallowing, mouth dryness and nose dryness.   Eyes: Negative for pain, redness, visual disturbance and dryness.  Respiratory: Negative for cough, shortness of breath and difficulty breathing.   Cardiovascular: Negative for chest pain, palpitations, hypertension, irregular heartbeat and swelling in legs/feet.  Gastrointestinal: Negative for blood in stool, constipation and diarrhea.  Endocrine: Negative for increased urination.  Genitourinary: Negative for vaginal dryness.  Musculoskeletal: Positive for arthralgias, joint pain and morning stiffness. Negative for joint swelling, myalgias, muscle weakness, muscle tenderness and myalgias.  Skin: Negative for color change, rash, hair loss, skin tightness, ulcers and sensitivity to sunlight.    Allergic/Immunologic: Negative for susceptible to infections.  Neurological: Negative for dizziness, memory loss, night sweats and weakness.  Hematological: Negative for swollen glands.  Psychiatric/Behavioral: Negative for depressed mood and sleep disturbance. The patient is not nervous/anxious.     PMFS History:  Patient Active Problem List   Diagnosis Date Noted  . Lumbar stenosis 05/13/2016  . High risk medication use 05/11/2016  . History of total hip replacement, right 05/11/2016  . Primary osteoarthritis of both feet 05/11/2016  . 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  . 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  . Low back pain 03/09/2016  . Spinal stenosis of lumbar region 03/09/2016  . Osteoarthritis of right knee 05/27/2014  . History of total knee replacement, bilateral 05/23/2014  . Autoimmune disease (HCC) 09/24/2013  . Osteoarthritis of left knee 09/13/2013    Class: Diagnosis of    Past Medical History:  Diagnosis Date  . Anemia   . 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HCC)   . PONV (postoperative nausea and vomiting)   . Primary osteoarthritis of right knee   . RA (rheumatoid arthritis) (HCC)   . Raynaud disease   . Raynaud disease   . Spinal headache    c-section  . Vasculitis (HCC)    on prednisone -     Family History  Problem Relation Age of Onset  . Cancer Brother    Past Surgical History:  Procedure Laterality Date  . abdominal clip     to stomach post gastric bypass  . CESAREAN SECTION     x 2  . GASTRIC BYPASS    . HERNIA REPAIR  inverted hermia  . JOINT REPLACEMENT Right    hip  . JOINT REPLACEMENT Bilateral    knee   . KNEE ARTHROPLASTY Left 09/13/2013   Procedure: LEFT COMPUTER ASSISTED TOTAL KNEE ARTHROPLASTY;  Surgeon: Eldred Manges, MD;  Location: [facility] OR;  Service: Orthopedics;  Laterality: Left;  Left Total Knee Arthroplasty, Computer Assist  . KNEE ARTHROPLASTY Right 05/23/2014   Procedure: COMPUTER ASSISTED TOTAL KNEE ARTHROPLASTY;   Surgeon: Eldred Manges, MD;  Location: [facility] OR;  Service: Orthopedics;  Laterality: Right;  . LUMBAR LAMINECTOMY/DECOMPRESSION MICRODISCECTOMY N/A 05/13/2016   Procedure: L4-5 Decompression;  Surgeon: Eldred Manges, MD;  Location: [facility] OR;  Service: Orthopedics;  Laterality: N/A;  . TOTAL KNEE ARTHROPLASTY Right 05/22/2014   dr Ophelia Charter  . TUBAL LIGATION     Social History   Social History Narrative  . Not on file     Objective: Vital Signs: BP 123/68 (BP Location: Left Arm, Patient Position: Sitting, Cuff Size: Normal)   Pulse 67   Resp 14   Ht 5\' 2"  (1.575 m)   Wt 164 lb 8 oz (74.6 kg)   BMI 30.09 kg/m    Physical Exam  Constitutional: She is oriented to person, place, and time. She appears well-developed and well-nourished.  HENT:  Head: Normocephalic and atraumatic.  Eyes: Conjunctivae and EOM are normal.  Neck: Normal range of motion.  Cardiovascular: Normal rate, regular rhythm, normal heart sounds and intact distal pulses.  Pulmonary/Chest: Effort normal and breath sounds normal.  Abdominal: Soft. Bowel sounds are normal.  Lymphadenopathy:    She has no cervical adenopathy.  Neurological: She is alert and oriented to person, place, and time.  Skin: Skin is warm and dry. Capillary refill takes less than 2 seconds.  Psychiatric: She has a normal mood and affect. Her behavior is normal.  Nursing note and vitals reviewed.    Musculoskeletal Exam: C-spine thoracic spine good range of motion.  Lumbar spine some discomfort with range of motion.  Shoulder joints of his wrist restraint MCPs PIPs DIPs with good range of motion.  Right hip joint is replaced but had good range of motion.  She has bilateral total knee replacement which full extension.  No MTP or PIP swelling was noted.  CDAI Exam: No CDAI exam completed.    Investigation: No additional findings.PLQ eye exam: 05/16/2017 CBC Latest Ref Rng & Units 03/14/2017 10/17/2016 05/14/2016  WBC 3.8 - 10.8 Thousand/uL 5.5 5.6 6.4    Hemoglobin 11.7 - 15.5 g/dL 05/16/2016 72.6 20.3)  Hematocrit 35.0 - 45.0 % 35.8 37.3 31.4(L)  Platelets 140 - 400 Thousand/uL 268 280 228   CMP Latest Ref Rng & Units 03/14/2017 10/17/2016 05/14/2016  Glucose 65 - 99 mg/dL 68 73 86  BUN 7 - 25 mg/dL 13 13 9   Creatinine 0.50 - 0.99 mg/dL 05/16/2016 7.41  Sodium 135 - 146 mmol/L 138 139 138  Potassium 3.5 - 5.3 mmol/L 4.7 4.7 3.8  Chloride 98 - 110 mmol/L 103 103 106  CO2 20 - 32 mmol/L 31 23 27   Calcium 8.6 - 10.4 mg/dL 9.7 6.38 9.6  Total Protein 6.1 - 8.1 g/dL 6.4 6.5 -  Total Bilirubin 0.2 - 1.2 mg/dL 0.5 0.4 -  Alkaline Phos 33 - 130 U/L - 80 -  AST 10 - 35 U/L 27 30 -  ALT 6 - 29 U/L 17 20 -    Imaging: No results found.  Speciality Comments: PLQ eye exam: 05/16/2017 normal. Dr. . Follow  up in 1 year.    Procedures:  No procedures performed Allergies: Ginger; Pork-derived products; and Shellfish allergy   Assessment / Plan:     Visit Diagnoses: Autoimmune disease (HCC) - Positive ANA 1:80 nucleolar, positive RF, negative CCP, anticardiolipin IgM 21, Raynauds, questionable history of vasculitis, -patient is clinically doing well without any synovitis on examination.  She has not had any rash from vasculitis.  Her rainouts is currently not active as the weather has warmed up.  Will obtain following labs to monitor the disease process.  If her labs are stable we may be able to reduce her Plaquenil to once a day.  Plan: Urinalysis, Routine w reflex microscopic, Anti-DNA antibody, double-stranded, C3 and C4, Sedimentation rate, Cardiolipin antibodies, IgG, IgM, IgA  High risk medication use - PLQ 200 mg p.o. twice daily Monday through Fridayeye exam: 05/16/2017 - Plan: CBC with Differential/Platelet, COMPLETE METABOLIC PANEL WITH GFR  Primary osteoarthritis of both feet-she has been using proper fitting shoes which is helpful.  DDD (degenerative disc disease), lumbar-she is not having much discomfort in the lumbar region  currently.  She has been doing yoga which has been helpful.  History of total knee replacement, bilateral-doing well  History of total hip replacement, right-she has good range of motion  History of gastric bypass   Patient reports getting DEXA through her PCP.  According to her the report has been normal.  Orders: Orders Placed This Encounter  Procedures  . CBC with Differential/Platelet  . COMPLETE METABOLIC PANEL WITH GFR  . Urinalysis, Routine w reflex microscopic  . Anti-DNA antibody, double-stranded  . C3 and C4  . Sedimentation rate  . Cardiolipin antibodies, IgG, IgM, IgA   No orders of the defined types were placed in this encounter.  .  Follow-Up Instructions: Return in about 6 months (around 02/16/2018) for Autoimmune disease, Osteoarthritis.   Pollyann Savoy, MD  Note - This record has been created using Animal nutritionist.  Chart creation errors have been sought, but may not always  have been located. Such creation errors do not reflect on  the standard of medical care.

## 2017-08-17 ENCOUNTER — Ambulatory Visit (INDEPENDENT_AMBULATORY_CARE_PROVIDER_SITE_OTHER): Payer: Medicare Other | Admitting: Rheumatology

## 2017-08-17 ENCOUNTER — Encounter: Payer: Self-pay | Admitting: Rheumatology

## 2017-08-17 VITALS — BP 123/68 | HR 67 | Resp 14 | Ht 62.0 in | Wt 164.5 lb

## 2017-08-17 DIAGNOSIS — M5136 Other intervertebral disc degeneration, lumbar region: Secondary | ICD-10-CM | POA: Diagnosis not present

## 2017-08-17 DIAGNOSIS — Z9884 Bariatric surgery status: Secondary | ICD-10-CM | POA: Diagnosis not present

## 2017-08-17 DIAGNOSIS — D8989 Other specified disorders involving the immune mechanism, not elsewhere classified: Secondary | ICD-10-CM | POA: Diagnosis not present

## 2017-08-17 DIAGNOSIS — M19072 Primary osteoarthritis, left ankle and foot: Secondary | ICD-10-CM

## 2017-08-17 DIAGNOSIS — Z79899 Other long term (current) drug therapy: Secondary | ICD-10-CM

## 2017-08-17 DIAGNOSIS — Z96653 Presence of artificial knee joint, bilateral: Secondary | ICD-10-CM

## 2017-08-17 DIAGNOSIS — Z96641 Presence of right artificial hip joint: Secondary | ICD-10-CM

## 2017-08-17 DIAGNOSIS — M19071 Primary osteoarthritis, right ankle and foot: Secondary | ICD-10-CM

## 2017-08-17 DIAGNOSIS — M359 Systemic involvement of connective tissue, unspecified: Secondary | ICD-10-CM

## 2017-08-18 LAB — URINALYSIS, ROUTINE W REFLEX MICROSCOPIC
Bacteria, UA: NONE SEEN /HPF
Bilirubin Urine: NEGATIVE
Glucose, UA: NEGATIVE
HYALINE CAST: NONE SEEN /LPF
Hgb urine dipstick: NEGATIVE
Nitrite: NEGATIVE
PH: 7 (ref 5.0–8.0)
Protein, ur: NEGATIVE
RBC / HPF: NONE SEEN /HPF (ref 0–2)
SPECIFIC GRAVITY, URINE: 1.016 (ref 1.001–1.03)
Squamous Epithelial / LPF: NONE SEEN /HPF (ref <=5)

## 2017-08-18 LAB — COMPLETE METABOLIC PANEL WITH GFR
AG RATIO: 1.8 (calc) (ref 1.0–2.5)
ALBUMIN MSPROF: 4.4 g/dL (ref 3.6–5.1)
ALT: 15 U/L (ref 6–29)
AST: 25 U/L (ref 10–35)
Alkaline phosphatase (APISO): 73 U/L (ref 33–130)
BILIRUBIN TOTAL: 0.6 mg/dL (ref 0.2–1.2)
BUN: 8 mg/dL (ref 7–25)
CHLORIDE: 100 mmol/L (ref 98–110)
CO2: 33 mmol/L — ABNORMAL HIGH (ref 20–32)
Calcium: 10.6 mg/dL — ABNORMAL HIGH (ref 8.6–10.4)
Creat: 0.76 mg/dL (ref 0.50–0.99)
GFR, EST AFRICAN AMERICAN: 95 mL/min/{1.73_m2} (ref >=60)
GFR, Est Non African American: 82 mL/min/{1.73_m2} (ref >=60)
Globulin: 2.4 g/dL (calc) (ref 1.9–3.7)
Glucose, Bld: 86 mg/dL (ref 65–99)
POTASSIUM: 4.6 mmol/L (ref 3.5–5.3)
SODIUM: 138 mmol/L (ref 135–146)
TOTAL PROTEIN: 6.8 g/dL (ref 6.1–8.1)

## 2017-08-18 LAB — CARDIOLIPIN ANTIBODIES, IGG, IGM, IGA: Anticardiolipin IgM: <12 [MPL'U]

## 2017-08-18 LAB — CBC WITH DIFFERENTIAL/PLATELET
BASOS ABS: 59 {cells}/uL (ref 0–200)
Basophils Relative: 1.1 %
EOS ABS: 151 {cells}/uL (ref 15–500)
EOS PCT: 2.8 %
HEMATOCRIT: 37.4 % (ref 35.0–45.0)
HEMOGLOBIN: 12.5 g/dL (ref 11.7–15.5)
LYMPHS ABS: 1264 {cells}/uL (ref 850–3900)
MCH: 28.5 pg (ref 27.0–33.0)
MCHC: 33.4 g/dL (ref 32.0–36.0)
MCV: 85.2 fL (ref 80.0–100.0)
MPV: 10.5 fL (ref 7.5–12.5)
Monocytes Relative: 8.3 %
NEUTROS ABS: 3478 {cells}/uL (ref 1500–7800)
NEUTROS PCT: 64.4 %
Platelets: 264 10*3/uL (ref 140–400)
RBC: 4.39 10*6/uL (ref 3.80–5.10)
RDW: 13.6 % (ref 11.0–15.0)
Total Lymphocyte: 23.4 %
WBC: 5.4 10*3/uL (ref 3.8–10.8)
WBCMIX: 448 {cells}/uL (ref 200–950)

## 2017-08-18 LAB — SEDIMENTATION RATE: Sed Rate: 9 mm/h (ref 0–30)

## 2017-08-18 LAB — C3 AND C4
C3 COMPLEMENT: 118 mg/dL (ref 83–193)
C4 COMPLEMENT: 27 mg/dL (ref 15–57)

## 2017-08-18 LAB — ANTI-DNA ANTIBODY, DOUBLE-STRANDED: ds DNA Ab: 1 IU/mL

## 2017-08-21 NOTE — Progress Notes (Signed)
May dc calcium supplement

## 2017-09-06 ENCOUNTER — Other Ambulatory Visit: Payer: Self-pay | Admitting: Family Medicine

## 2017-09-06 DIAGNOSIS — Z1231 Encounter for screening mammogram for malignant neoplasm of breast: Secondary | ICD-10-CM

## 2017-09-07 ENCOUNTER — Ambulatory Visit
Admission: RE | Admit: 2017-09-07 | Discharge: 2017-09-07 | Disposition: A | Discharge status: Home / Self Care | Payer: Medicare Other | Source: Ambulatory Visit | Attending: Family Medicine | Admitting: Family Medicine

## 2017-09-07 DIAGNOSIS — Z1231 Encounter for screening mammogram for malignant neoplasm of breast: Secondary | ICD-10-CM

## 2017-09-08 DIAGNOSIS — M79674 Pain in right toe(s): Secondary | ICD-10-CM | POA: Diagnosis not present

## 2017-09-08 DIAGNOSIS — S93401S Sprain of unspecified ligament of right ankle, sequela: Secondary | ICD-10-CM | POA: Diagnosis not present

## 2017-09-08 DIAGNOSIS — G8929 Other chronic pain: Secondary | ICD-10-CM | POA: Diagnosis not present

## 2017-09-14 DIAGNOSIS — G8929 Other chronic pain: Secondary | ICD-10-CM | POA: Diagnosis not present

## 2017-09-14 DIAGNOSIS — M79674 Pain in right toe(s): Secondary | ICD-10-CM | POA: Diagnosis not present

## 2017-09-19 ENCOUNTER — Ambulatory Visit: Payer: Medicare Other | Admitting: Physical Therapy

## 2017-09-21 DIAGNOSIS — M545 Low back pain: Secondary | ICD-10-CM | POA: Diagnosis not present

## 2017-09-21 DIAGNOSIS — M25571 Pain in right ankle and joints of right foot: Secondary | ICD-10-CM | POA: Diagnosis not present

## 2017-09-27 DIAGNOSIS — M545 Low back pain: Secondary | ICD-10-CM | POA: Diagnosis not present

## 2017-09-27 DIAGNOSIS — M25571 Pain in right ankle and joints of right foot: Secondary | ICD-10-CM | POA: Diagnosis not present

## 2017-10-02 DIAGNOSIS — M545 Low back pain: Secondary | ICD-10-CM | POA: Diagnosis not present

## 2017-10-02 DIAGNOSIS — M25571 Pain in right ankle and joints of right foot: Secondary | ICD-10-CM | POA: Diagnosis not present

## 2017-10-09 DIAGNOSIS — M25571 Pain in right ankle and joints of right foot: Secondary | ICD-10-CM | POA: Diagnosis not present

## 2017-10-09 DIAGNOSIS — M545 Low back pain: Secondary | ICD-10-CM | POA: Diagnosis not present

## 2017-10-13 DIAGNOSIS — M25571 Pain in right ankle and joints of right foot: Secondary | ICD-10-CM | POA: Diagnosis not present

## 2017-10-13 DIAGNOSIS — M545 Low back pain: Secondary | ICD-10-CM | POA: Diagnosis not present

## 2017-11-23 ENCOUNTER — Telehealth: Payer: Self-pay | Admitting: Rheumatology

## 2017-11-23 NOTE — Telephone Encounter (Signed)
Patient called requesting prescription refill of Hydroxychloroquine to be sent to [facility] on [address] in [place].  Patient states she will run out of medication next week.

## 2017-11-24 MED ORDER — HYDROXYCHLOROQUINE SULFATE 200 MG PO TABS: ORAL_TABLET | ORAL | 1 refills | Status: DC

## 2017-11-24 NOTE — Telephone Encounter (Signed)
Last Visit: 08/17/17 Next visit: 02/20/18 Labs: 08/17/17 May dc calcium supplement PLQ eye exam: 05/16/2017 normal.   Okay to refill per Dr. Corliss Skains

## 2017-12-27 ENCOUNTER — Ambulatory Visit (INDEPENDENT_AMBULATORY_CARE_PROVIDER_SITE_OTHER): Payer: Medicare Other

## 2017-12-27 ENCOUNTER — Ambulatory Visit (INDEPENDENT_AMBULATORY_CARE_PROVIDER_SITE_OTHER): Payer: Medicare Other | Admitting: Orthopaedic Surgery

## 2017-12-27 ENCOUNTER — Encounter (INDEPENDENT_AMBULATORY_CARE_PROVIDER_SITE_OTHER): Payer: Self-pay | Admitting: Orthopaedic Surgery

## 2017-12-27 VITALS — BP 128/76 | HR 66 | Ht 62.0 in | Wt 160.0 lb

## 2017-12-27 DIAGNOSIS — M25561 Pain in right knee: Secondary | ICD-10-CM | POA: Diagnosis not present

## 2017-12-27 DIAGNOSIS — G8929 Other chronic pain: Secondary | ICD-10-CM

## 2017-12-27 DIAGNOSIS — M25562 Pain in left knee: Secondary | ICD-10-CM

## 2017-12-27 DIAGNOSIS — M79642 Pain in left hand: Secondary | ICD-10-CM | POA: Diagnosis not present

## 2017-12-27 DIAGNOSIS — Z9889 Other specified postprocedural states: Secondary | ICD-10-CM | POA: Insufficient documentation

## 2017-12-27 NOTE — Progress Notes (Signed)
Office Visit Note   Patient: Chelsea Callahan           Date of Birth: 05/12/1950           MRN: [id number] Visit Date: 12/27/2017              Requested by: Verlon Au, MD [address] [place], [place] [address] PCP: Verlon Au, MD   Assessment & Plan: Visit Diagnoses:  1. Chronic pain of both knees   2. Pain in left hand   3.    Quad weakness post bilateral TKA  Plan: Patient has sprain of her left MP joint after fall.  Repetitive falls are likely related to bilateral quad weakness post total knee arthroplasty with inadequate quad strengthening.  We will send her for physical therapy for quad strengthening.  Thumb gutter splint applied with Velcro that she can use intermittently.  I plan to check her back in 2 months.  We discussed the importance of quad strengthening to improve her gait and decrease her fall risk.  She has for a plastic surgery referral for excessive subcutaneous fat around her knees particularly overhanging the patella which is a cosmetic problem that bothers her significantly after her significant weight loss with gastric bypass surgery.  Referral will be made.  Office recheck 2 months.  Follow-Up Instructions: No follow-ups on file.   Orders:  Orders Placed This Encounter  Procedures  . XR Knee 1-2 Views Right  . XR Knee 1-2 Views Left  . XR Hand Complete Left   No orders of the defined types were placed in this encounter.     Procedures: No procedures performed   Clinical Data: No additional findings.   Subjective: Chief Complaint  Patient presents with  . Left Knee - Pain  . Right Knee - Pain  . Left Thumb - Pain    HPI 67 year old female who had left TKA in 2015 and right TKA 2016 followed by 2018 L4-5 decompression for spinal stenosis returns with history of having some falls.  She recently fell and injured her left thumb at the MP joint with pain and stiffness.  She taped a splint over the dorsum of the thumb  which is helped to some degree.  She has had pain with squeezing points to the MP joint where she has discomfort.  Her falls have been related to leg weakness.  She has to use a rail when she walks up steps she tries to avoid long rows of steps going down.  She went through physical therapy after total knee arthroplasties as well as lumbar decompression and exercise program.  No residual stenosis symptoms.  Review of Systems previous right total elbow arthroplasty done more than 10 years ago in [place].  Bilateral total knee arthroplasties.  Positive for past history of morbid obesity with weight loss greater than 100 pounds after gastric bypass surgery.  Previous lumbar decompression for L4-5 spinal stenosis 2018.  14 point review of systems otherwise negative as it pertains HPI.   Objective: Vital Signs: BP 128/76   Pulse 66   Ht 5\' 2"  (1.575 m)   Wt 160 lb (72.6 kg)   BMI 29.26 kg/m   Physical Exam  Constitutional: She is oriented to person, place, and time. She appears well-developed.  HENT:  Head: Normocephalic.  Right Ear: External ear normal.  Left Ear: External ear normal.  Eyes: Pupils are equal, round, and reactive to light.  Neck: No tracheal  deviation present. No thyromegaly present.  Cardiovascular: Normal rate.  Pulmonary/Chest: Effort normal.  Abdominal: Soft.  Neurological: She is alert and oriented to person, place, and time.  Skin: Skin is warm and dry.  Psychiatric: She has a normal mood and affect. Her behavior is normal.    Ortho Exam patient has well-healed total knee arthroplasty incisions.  She has scant subcutaneous adipose tissue overhang over her patella bilaterally as well as significant extra subcutaneous adipose tissue on the thighs.  Well-healed incisions from arm tuck procedures right and left.  Good knee stability both knees.  With single stance step up she has a hop type gait with bilateral residual quad weakness post total knee arthroplasties.  No pain  with right hip range of motion well-healed posterior incision right hip.  Distal pulses are intact negative straight leg raising.  Well-healed lumbar incision.  She walks with slight knee flexed weak quad gait.  Both knees do reach full extension passively and flex to 120 degrees right and left.  No pes bursa tenderness.  No excessive AP laxity right or left knee in flexion.  Good collateral balance in flexion and extension both knees.  Patient has some tenderness over the MP joint left thumb with some laxity symmetrically of the ulnar collateral ligaments both right and left.  No triggering but mild tenderness over the A1 pulley of the left thumb.  No tenderness over the [facility] joint.  Scaphoid is normal to palpation good wrist range of motion.  She has some discomfort at the MP joint with side thumb pinch.  Ulnar nerve the elbow median nerve the wrist and forearm is normal. Specialty Comments:  No specialty comments available.  Imaging: No results found.   PMFS History: Patient Active Problem List   Diagnosis Date Noted  . Lumbar stenosis 05/13/2016  . High risk medication use 05/11/2016  . History of total hip replacement, right 05/11/2016  . Primary osteoarthritis of both feet 05/11/2016  . 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  . 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  . Low back pain 03/09/2016  . Spinal stenosis of lumbar region 03/09/2016  . Osteoarthritis of right knee 05/27/2014  . History of total knee replacement, bilateral 05/23/2014  . Autoimmune disease (HCC) 09/24/2013  . Osteoarthritis of left knee 09/13/2013    Class: Diagnosis of   Past Medical History:  Diagnosis Date  . Anemia   . 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HCC)   . PONV (postoperative nausea and vomiting)   . Primary osteoarthritis of right knee   . RA (rheumatoid arthritis) (HCC)   . Raynaud disease   . Raynaud disease   . Spinal headache    c-section  . Vasculitis (HCC)    on prednisone -     Family History   Problem Relation Age of Onset  . Cancer Brother     Past Surgical History:  Procedure Laterality Date  . abdominal clip     to stomach post gastric bypass  . CESAREAN SECTION     x 2  . GASTRIC BYPASS    . HERNIA REPAIR     inverted hermia  . JOINT REPLACEMENT Right    hip  . JOINT REPLACEMENT Bilateral    knee   . KNEE ARTHROPLASTY Left 09/13/2013   Procedure: LEFT COMPUTER ASSISTED TOTAL KNEE ARTHROPLASTY;  Surgeon: Eldred Manges, MD;  Location: [facility] OR;  Service: Orthopedics;  Laterality: Left;  Left Total Knee Arthroplasty, Computer Assist  . KNEE ARTHROPLASTY Right 05/23/2014  Procedure: COMPUTER ASSISTED TOTAL KNEE ARTHROPLASTY;  Surgeon: Eldred Manges, MD;  Location: [facility] OR;  Service: Orthopedics;  Laterality: Right;  . LUMBAR LAMINECTOMY/DECOMPRESSION MICRODISCECTOMY N/A 05/13/2016   Procedure: L4-5 Decompression;  Surgeon: Eldred Manges, MD;  Location: [facility] OR;  Service: Orthopedics;  Laterality: N/A;  . TOTAL KNEE ARTHROPLASTY Right 05/22/2014   dr Ophelia Charter  . TUBAL LIGATION     Social History   Occupational History  . Not on file  Tobacco Use  . Smoking status: Former Smoker    Types: Cigarettes  . Smokeless tobacco: Never Used  . Tobacco comment: Quit smoking cigarettes in 1999- light smoker  Substance and Sexual Activity  . Alcohol use: Yes    Alcohol/week: 4.0 standard drinks    Types: 4 Glasses of wine per week    Comment: weekly   . Drug use: No  . Sexual activity: Not on file

## 2018-01-26 ENCOUNTER — Telehealth: Payer: Self-pay | Admitting: Rheumatology

## 2018-01-26 NOTE — Telephone Encounter (Signed)
Patient contacted the office to know with her being immunocompromised can she have the flu vaccination and the shingles vaccination. I advised patient that it is recommended to get both vaccinations. I advised patient to make sure she got the Shingrix as it is not the live version. Patient verbalized understanding.

## 2018-01-26 NOTE — Telephone Encounter (Signed)
Patient left a voicemail stating she has questions regarding the flu and shingles vaccine.  Patient requested a return call.

## 2018-02-06 NOTE — Progress Notes (Deleted)
Office Visit Note  Patient: Chelsea Callahan             Date of Birth: [birth year]           MRN: [id number]             PCP: Verlon Au, MD Referring: Verlon Au, MD Visit Date: 02/20/2018 Occupation: @GUAROCC @  Subjective:  No chief complaint on file.   History of Present Illness: Chelsea Callahan is a 67 y.o. female ***   Activities of Daily Living:  Patient reports morning stiffness for *** {minute/hour:19697}.   Patient {ACTIONS;DENIES/REPORTS:21021675::"Denies"} nocturnal pain.  Difficulty dressing/grooming: {ACTIONS;DENIES/REPORTS:21021675::"Denies"} Difficulty climbing stairs: {ACTIONS;DENIES/REPORTS:21021675::"Denies"} Difficulty getting out of chair: {ACTIONS;DENIES/REPORTS:21021675::"Denies"} Difficulty using hands for taps, buttons, cutlery, and/or writing: {ACTIONS;DENIES/REPORTS:21021675::"Denies"}  No Rheumatology ROS completed.   PMFS History:  Patient Active Problem List   Diagnosis Date Noted  . Chronic pain of both knees 12/27/2017  . Pain in left hand 12/27/2017  . History of lumbar laminectomy for spinal cord decompression 12/27/2017  . Lumbar stenosis 05/13/2016  . High risk medication use 05/11/2016  . History of total hip replacement, right 05/11/2016  . Primary osteoarthritis of both feet 05/11/2016  . 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  . 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  . Low back pain 03/09/2016  . Spinal stenosis of lumbar region 03/09/2016  . Osteoarthritis of right knee 05/27/2014  . History of total knee replacement, bilateral 05/23/2014  . Autoimmune disease (HCC) 09/24/2013  . Osteoarthritis of left knee 09/13/2013    Class: Diagnosis of    Past Medical History:  Diagnosis Date  . Anemia   . 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HCC)   . PONV (postoperative nausea and vomiting)   . Primary osteoarthritis of right knee   . RA (rheumatoid arthritis) (HCC)   . Raynaud disease   . Raynaud disease   . Spinal headache    c-section  . Vasculitis (HCC)    on prednisone -     Family History  Problem Relation Age of Onset  . Cancer Brother    Past Surgical History:  Procedure Laterality Date  . abdominal clip     to stomach post gastric bypass  . CESAREAN SECTION     x 2  . GASTRIC BYPASS    . HERNIA REPAIR     inverted hermia  . JOINT REPLACEMENT Right    hip  . JOINT REPLACEMENT Bilateral    knee   . KNEE ARTHROPLASTY Left 09/13/2013   Procedure: LEFT COMPUTER ASSISTED TOTAL KNEE ARTHROPLASTY;  Surgeon: 09/15/2013, MD;  Location: [facility] OR;  Service: Orthopedics;  Laterality: Left;  Left Total Knee Arthroplasty, Computer Assist  . KNEE ARTHROPLASTY Right 05/23/2014   Procedure: COMPUTER ASSISTED TOTAL KNEE ARTHROPLASTY;  Surgeon: 05/25/2014, MD;  Location: [facility] OR;  Service: Orthopedics;  Laterality: Right;  . LUMBAR LAMINECTOMY/DECOMPRESSION MICRODISCECTOMY N/A 05/13/2016   Procedure: L4-5 Decompression;  Surgeon: 07/11/2016, MD;  Location: [facility] OR;  Service: Orthopedics;  Laterality: N/A;  . TOTAL KNEE ARTHROPLASTY Right 05/22/2014   dr 05/24/2014  . TUBAL LIGATION     Social History   Social History Narrative  . Not on file    Objective: Vital Signs: There were no vitals taken for this visit.   Physical Exam   Musculoskeletal Exam: ***  CDAI Exam: CDAI Score: Not documented Patient Global Assessment: Not documented; Provider Global Assessment: Not documented Swollen: Not documented; Tender: Not documented Joint Exam   Not documented  There is currently no information documented on the homunculus. Go to the Rheumatology activity and complete the homunculus joint exam.  Investigation: No additional findings.  Imaging: No results found.  Recent Labs: Lab Results  Component Value Date   WBC 5.4 08/17/2017   HGB 12.5 08/17/2017   PLT 264 08/17/2017   NA 138 08/17/2017   K 4.6 08/17/2017   CL 100 08/17/2017   CO2 33 (H) 08/17/2017   GLUCOSE 86 08/17/2017   BUN 8 08/17/2017    CREATININE 0.76 08/17/2017   BILITOT 0.6 08/17/2017   ALKPHOS 80 10/17/2016   AST 25 08/17/2017   ALT 15 08/17/2017   PROT 6.8 08/17/2017   ALBUMIN 4.2 10/17/2016   CALCIUM 10.6 (H) 08/17/2017   GFRAA 95 08/17/2017    Speciality Comments: PLQ eye exam: 05/16/2017 normal. Dr. Ernesto Rutherford. Follow up in 1 year.  Procedures:  No procedures performed Allergies: Ginger; Pork-derived products; and Shellfish allergy   Assessment / Plan:     Visit Diagnoses: No diagnosis found.   Orders: No orders of the defined types were placed in this encounter.  No orders of the defined types were placed in this encounter.   Face-to-face time spent with patient was *** minutes. Greater than 50% of time was spent in counseling and coordination of care.  Follow-Up Instructions: No follow-ups on file.   Ellen Henri, CMA  Note - This record has been created using Animal nutritionist.  Chart creation errors have been sought, but may not always  have been located. Such creation errors do not reflect on  the standard of medical care.

## 2018-02-20 ENCOUNTER — Ambulatory Visit: Payer: Medicare Other | Admitting: Rheumatology

## 2018-02-26 NOTE — Progress Notes (Signed)
Office Visit Note  Patient: Chelsea Callahan             Date of Birth: 03-01-1951           MRN: [id number]             PCP: Verlon Au, MD Referring: Verlon Au, MD Visit Date: 03/07/2018 Occupation: @GUAROCC @  Subjective:  Medication monitoring   History of Present Illness: Chelsea Callahan is a 67 y.o. female with history of autoimmune disease and osteoarthritis.  She is on Plaquenil 200 mg 1 tablet by mouth twice daily Monday through Friday.  She has not had any flares of autoimmune disease recently.  She denies any recent rashes, sun sensitivity, sores in her mouth or nose, sicca symptoms, fatigue, low-grade fevers.  She denies any swollen lymph nodes.  She states that she does not get the yearly influenza vaccine.  She continues to have intermittent symptoms of Raynaud's in her hands.  She denies any digital ulcerations.  She states that she occasionally has pain in bilateral knee replacements.  She denies any joint swelling.  She states that her right hip replacement is doing well.  She denies any pain in the left hip.  She denies any groin pain at this time.  She denies any pain in her feet or lower back.  She reports she does not need any refills at this time.   Activities of Daily Living:  Patient reports morning stiffness for 2 minutes.   Patient Reports nocturnal pain.  Difficulty dressing/grooming: Denies Difficulty climbing stairs: Denies Difficulty getting out of chair: Denies Difficulty using hands for taps, buttons, cutlery, and/or writing: Denies  Review of Systems  Constitutional: Negative for fatigue.  HENT: Negative for mouth sores, mouth dryness and nose dryness.   Eyes: Negative for pain, visual disturbance and dryness.  Respiratory: Negative for cough, hemoptysis, shortness of breath and difficulty breathing.   Cardiovascular: Negative for chest pain, palpitations, hypertension and swelling in legs/feet.  Gastrointestinal: Negative for blood in stool,  constipation and diarrhea.  Endocrine: Negative for increased urination.  Genitourinary: Negative for painful urination.  Musculoskeletal: Negative for arthralgias, joint pain, joint swelling, myalgias, muscle weakness, morning stiffness, muscle tenderness and myalgias.  Skin: Negative for color change, pallor, rash, hair loss, nodules/bumps, skin tightness, ulcers and sensitivity to sunlight.  Allergic/Immunologic: Negative for susceptible to infections.  Neurological: Negative for dizziness, numbness, headaches and weakness.  Hematological: Negative for swollen glands.  Psychiatric/Behavioral: Negative for depressed mood and sleep disturbance. The patient is not nervous/anxious.     PMFS History:  Patient Active Problem List   Diagnosis Date Noted  . Chronic pain of both knees 12/27/2017  . Pain in left hand 12/27/2017  . History of lumbar laminectomy for spinal cord decompression 12/27/2017  . Lumbar stenosis 05/13/2016  . High risk medication use 05/11/2016  . History of total hip replacement, right 05/11/2016  . Primary osteoarthritis of both feet 05/11/2016  . 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  . 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  . Low back pain 03/09/2016  . Spinal stenosis of lumbar region 03/09/2016  . Osteoarthritis of right knee 05/27/2014  . History of total knee replacement, bilateral 05/23/2014  . Autoimmune disease (HCC) 09/24/2013  . Osteoarthritis of left knee 09/13/2013    Class: Diagnosis of    Past Medical History:  Diagnosis Date  . Anemia   . 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HCC)   . PONV (postoperative nausea and vomiting)   . Primary  osteoarthritis of right knee   . RA (rheumatoid arthritis) (HCC)   . Raynaud disease   . Raynaud disease   . Spinal headache    c-section  . Vasculitis (HCC)    on prednisone -     Family History  Problem Relation Age of Onset  . Cancer Brother    Past Surgical History:  Procedure Laterality Date  . abdominal  clip     to stomach post gastric bypass  . CESAREAN SECTION     x 2  . GASTRIC BYPASS    . HERNIA REPAIR     inverted hermia  . JOINT REPLACEMENT Right    hip  . JOINT REPLACEMENT Bilateral    knee   . KNEE ARTHROPLASTY Left 09/13/2013   Procedure: LEFT COMPUTER ASSISTED TOTAL KNEE ARTHROPLASTY;  Surgeon: Eldred Manges, MD;  Location: [facility] OR;  Service: Orthopedics;  Laterality: Left;  Left Total Knee Arthroplasty, Computer Assist  . KNEE ARTHROPLASTY Right 05/23/2014   Procedure: COMPUTER ASSISTED TOTAL KNEE ARTHROPLASTY;  Surgeon: Eldred Manges, MD;  Location: [facility] OR;  Service: Orthopedics;  Laterality: Right;  . LUMBAR LAMINECTOMY/DECOMPRESSION MICRODISCECTOMY N/A 05/13/2016   Procedure: L4-5 Decompression;  Surgeon: Eldred Manges, MD;  Location: [facility] OR;  Service: Orthopedics;  Laterality: N/A;  . TOTAL KNEE ARTHROPLASTY Right 05/22/2014   dr Ophelia Charter  . TUBAL LIGATION     Social History   Social History Narrative  . Not on file    Objective: Vital Signs: BP 125/69 (BP Location: Left Arm, Patient Position: Sitting, Cuff Size: Normal)   Pulse (!) 59   Resp 14   Ht 5\' 2"  (1.575 m)   Wt 163 lb (73.9 kg)   BMI 29.81 kg/m    Physical Exam  Constitutional: She is oriented to person, place, and time. She appears well-developed and well-nourished.  HENT:  Head: Normocephalic and atraumatic.  No oral or nasal ulcerations.  No parotid swelling.  Eyes: Conjunctivae and EOM are normal.  Neck: Normal range of motion.  Cardiovascular: Normal rate, regular rhythm, normal heart sounds and intact distal pulses.  Pulmonary/Chest: Effort normal and breath sounds normal.  Abdominal: Soft. Bowel sounds are normal.  Lymphadenopathy:    She has no cervical adenopathy.  Neurological: She is alert and oriented to person, place, and time.  Skin: Skin is warm and dry. Capillary refill takes less than 2 seconds.  No malar rash noted.  No digital ulcerations or signs of gangrene.   Psychiatric: She has a  normal mood and affect. Her behavior is normal.  Nursing note and vitals reviewed.    Musculoskeletal Exam: C-spine, thoracic spine, lumbar spine good range of motion.  No midline spinal tenderness.  No SI joint tenderness.  Shoulder joints, elbow joints, wrist joints, MCPs and PIPs, DIPs good range of motion no synovitis.  She has complete fist formation bilaterally.  Hip joints good range of motion with no discomfort.  Bilateral knee replacements doing well.  No warmth or effusion noted.  No tenderness or swelling of ankle joints.  No tenderness of trochanteric bursa bilaterally.  CDAI Exam: CDAI Score: Not documented Patient Global Assessment: Not documented; Provider Global Assessment: Not documented Swollen: Not documented; Tender: Not documented Joint Exam   Not documented   There is currently no information documented on the homunculus. Go to the Rheumatology activity and complete the homunculus joint exam.  Investigation: No additional findings.  Imaging: No results found.  Recent Labs: Lab Results  Component Value  Date   WBC 5.4 08/17/2017   HGB 12.5 08/17/2017   PLT 264 08/17/2017   NA 138 08/17/2017   K 4.6 08/17/2017   CL 100 08/17/2017   CO2 33 (H) 08/17/2017   GLUCOSE 86 08/17/2017   BUN 8 08/17/2017   CREATININE 0.76 08/17/2017   BILITOT 0.6 08/17/2017   ALKPHOS 80 10/17/2016   AST 25 08/17/2017   ALT 15 08/17/2017   PROT 6.8 08/17/2017   ALBUMIN 4.2 10/17/2016   CALCIUM 10.6 (H) 08/17/2017   GFRAA 95 08/17/2017   Component     Latest Ref Rng & Units 08/17/2017  Anticardiolipin Ab,IgA,Qn     APL <11  Anticardiolipin Ab,IgG,Qn     GPL <14  Anticardiolipin Ab,IgM,Qn     MPL <12  C3 Complement     83 - 193 mg/dL 338  C4 Complement     15 - 57 mg/dL 27  ds DNA Ab     IU/mL 1  Sed Rate     0 - 30 mm/h 9    Speciality Comments: PLQ eye exam: 05/16/2017 normal. Dr. Ernesto Rutherford. Follow up in 1 year.  Procedures:  No procedures  performed Allergies: Ginger; Pork-derived products; and Shellfish allergy   Assessment / Plan:     Visit Diagnoses: Autoimmune disease (HCC) - Positive ANA 1:80 nucleolar, positive RF, negative CCP, anticardiolipin IgM 21, Raynauds, questionable history of vasculitis: She has not had any recent flares of autoimmune disease.  She is clinically doing well on Plaquenil 200 mg 1 tablet by mouth twice daily Monday through Friday.  She has no synovitis or joint tenderness on exam.  She has no oral or nasal ulcerations on exam.  She is not experiencing any sicca symptoms and has no parotid swelling.  She continues to have intermittent symptoms of Raynaud's but no digital ulcerations or signs of gangrene were noted.  No malar rash noted.  She has not had any recent skin rashes or sun sensitivity.  She denies any recent fatigue or low-grade fevers.  She denies any shortness of breath or chest pain.  She will continue on Plaquenil 200 mg 1 tablet by mouth twice daily Monday through Friday.  She does not need any refills at this time.  We will check autoimmune labs today.  She will follow-up in the office in 5 months.  She was advised to notify us if she develops any new or worsening symptoms.- Plan: C3 and C4, Anti-DNA antibody, double-stranded, Sedimentation rate, Urinalysis, Routine w reflex microscopic  High risk medication use -  PLQ 200 mg p.o. twice daily Monday through Friday. eye exam: 05/16/2017 -CBC and CMP were drawn today to monitor for drug toxicity.- Plan: CBC with Differential/Platelet, COMPLETE METABOLIC PANEL WITH GFR  Primary osteoarthritis of both feet: She has no discomfort in her feet at this time.  She was proper  History of total knee replacement, bilateral: Dr. Leandro Reasoner warmth or effusion.  Good range of motion with no discomfort.  She has occasional discomfort in bilateral knee replacements.  History of total hip replacement, right: Doing well.  Good range of motion with no  discomfort.  DDD Lumbar spine with spinal stenosis: No midline spinal tenderness.  Good range of motion with no discomfort.  She has no lower back pain at this time.  Other medical conditions are listed as follows:  History of gastric bypass  Osteopenia of multiple sites   Orders: Orders Placed This Encounter  Procedures  . CBC with Differential/Platelet  .  COMPLETE METABOLIC PANEL WITH GFR  . C3 and C4  . Anti-DNA antibody, double-stranded  . Sedimentation rate  . Urinalysis, Routine w reflex microscopic   No orders of the defined types were placed in this encounter.     Follow-Up Instructions: Return in about 5 months (around 08/06/2018) for Autoimmune Disease, Osteoarthritis, DDD.   Gearldine Bienenstock, PA-C  Note - This record has been created using Dragon software.  Chart creation errors have been sought, but may not always  have been located. Such creation errors do not reflect on  the standard of medical care.

## 2018-02-27 ENCOUNTER — Ambulatory Visit (INDEPENDENT_AMBULATORY_CARE_PROVIDER_SITE_OTHER): Payer: Medicare Other | Admitting: Orthopaedic Surgery

## 2018-03-07 ENCOUNTER — Encounter: Payer: Self-pay | Admitting: Physician Assistant

## 2018-03-07 ENCOUNTER — Ambulatory Visit (INDEPENDENT_AMBULATORY_CARE_PROVIDER_SITE_OTHER): Payer: Medicare Other | Admitting: Physician Assistant

## 2018-03-07 ENCOUNTER — Encounter (INDEPENDENT_AMBULATORY_CARE_PROVIDER_SITE_OTHER): Payer: Self-pay

## 2018-03-07 VITALS — BP 125/69 | HR 59 | Resp 14 | Ht 62.0 in | Wt 163.0 lb

## 2018-03-07 DIAGNOSIS — M359 Systemic involvement of connective tissue, unspecified: Secondary | ICD-10-CM

## 2018-03-07 DIAGNOSIS — Z79899 Other long term (current) drug therapy: Secondary | ICD-10-CM

## 2018-03-07 DIAGNOSIS — Z96641 Presence of right artificial hip joint: Secondary | ICD-10-CM

## 2018-03-07 DIAGNOSIS — M8589 Other specified disorders of bone density and structure, multiple sites: Secondary | ICD-10-CM

## 2018-03-07 DIAGNOSIS — M19072 Primary osteoarthritis, left ankle and foot: Secondary | ICD-10-CM

## 2018-03-07 DIAGNOSIS — Z96653 Presence of artificial knee joint, bilateral: Secondary | ICD-10-CM

## 2018-03-07 DIAGNOSIS — M47816 Spondylosis without myelopathy or radiculopathy, lumbar region: Secondary | ICD-10-CM

## 2018-03-07 DIAGNOSIS — M19071 Primary osteoarthritis, right ankle and foot: Secondary | ICD-10-CM | POA: Diagnosis not present

## 2018-03-07 DIAGNOSIS — Z9884 Bariatric surgery status: Secondary | ICD-10-CM

## 2018-03-08 LAB — URINALYSIS, ROUTINE W REFLEX MICROSCOPIC
BILIRUBIN URINE: NEGATIVE
GLUCOSE, UA: NEGATIVE
HGB URINE DIPSTICK: NEGATIVE
LEUKOCYTES UA: NEGATIVE
Nitrite: NEGATIVE
PH: 5.5 (ref 5.0–8.0)
PROTEIN: NEGATIVE
Specific Gravity, Urine: 1.013 (ref 1.001–1.03)

## 2018-03-08 LAB — CBC WITH DIFFERENTIAL/PLATELET
BASOS ABS: 53 {cells}/uL (ref 0–200)
Basophils Relative: 1 %
EOS ABS: 154 {cells}/uL (ref 15–500)
Eosinophils Relative: 2.9 %
HCT: 37.9 % (ref 35.0–45.0)
HEMOGLOBIN: 12.4 g/dL (ref 11.7–15.5)
LYMPHS ABS: 1415 {cells}/uL (ref 850–3900)
MCH: 29 pg (ref 27.0–33.0)
MCHC: 32.7 g/dL (ref 32.0–36.0)
MCV: 88.8 fL (ref 80.0–100.0)
MPV: 10.7 fL (ref 7.5–12.5)
Monocytes Relative: 7.4 %
NEUTROS ABS: 3286 {cells}/uL (ref 1500–7800)
Neutrophils Relative %: 62 %
Platelets: 247 10*3/uL (ref 140–400)
RBC: 4.27 10*6/uL (ref 3.80–5.10)
RDW: 12.5 % (ref 11.0–15.0)
Total Lymphocyte: 26.7 %
WBC: 5.3 10*3/uL (ref 3.8–10.8)
WBCMIX: 392 {cells}/uL (ref 200–950)

## 2018-03-08 LAB — COMPLETE METABOLIC PANEL WITH GFR
AG Ratio: 2 (calc) (ref 1.0–2.5)
ALT: 16 U/L (ref 6–29)
AST: 29 U/L (ref 10–35)
Albumin: 4.3 g/dL (ref 3.6–5.1)
Alkaline phosphatase (APISO): 58 U/L (ref 33–130)
BUN: 10 mg/dL (ref 7–25)
CO2: 29 mmol/L (ref 20–32)
Calcium: 10.6 mg/dL — ABNORMAL HIGH (ref 8.6–10.4)
Chloride: 102 mmol/L (ref 98–110)
Creat: 0.81 mg/dL (ref 0.50–0.99)
GFR, EST AFRICAN AMERICAN: 88 mL/min/{1.73_m2} (ref >=60)
GFR, Est Non African American: 76 mL/min/{1.73_m2} (ref >=60)
GLUCOSE: 72 mg/dL (ref 65–99)
Globulin: 2.2 g/dL (calc) (ref 1.9–3.7)
POTASSIUM: 4.7 mmol/L (ref 3.5–5.3)
Sodium: 138 mmol/L (ref 135–146)
Total Bilirubin: 0.5 mg/dL (ref 0.2–1.2)
Total Protein: 6.5 g/dL (ref 6.1–8.1)

## 2018-03-08 LAB — C3 AND C4
C3 Complement: 100 mg/dL (ref 83–193)
C4 COMPLEMENT: 25 mg/dL (ref 15–57)

## 2018-03-08 LAB — SEDIMENTATION RATE: SED RATE: 1 mm/h (ref 0–30)

## 2018-03-08 LAB — ANTI-DNA ANTIBODY, DOUBLE-STRANDED: DS DNA AB: 1 [IU]/mL

## 2018-03-08 NOTE — Progress Notes (Signed)
CBC WNL. Sed rate WNL. Complements WNL. DsDNA negative. UA revealed trace ketones. Please forward to PCP. Calcium is elevated-10.6. She should avoid taking a calcium supplement at this time.

## 2018-05-22 ENCOUNTER — Other Ambulatory Visit: Payer: Self-pay | Admitting: Rheumatology

## 2018-05-22 NOTE — Telephone Encounter (Signed)
Last Visit: 03/07/18 Next Visit: 08/13/18 Labs: 03/07/18 CBC WNL Calcium is elevated-10.6. PLQ eye exam: 05/16/2017 normal.  Okay to refill per Dr. Corliss Skains

## 2018-06-06 DIAGNOSIS — H2513 Age-related nuclear cataract, bilateral: Secondary | ICD-10-CM | POA: Diagnosis not present

## 2018-06-06 DIAGNOSIS — D899 Disorder involving the immune mechanism, unspecified: Secondary | ICD-10-CM | POA: Diagnosis not present

## 2018-06-06 DIAGNOSIS — Z79899 Other long term (current) drug therapy: Secondary | ICD-10-CM | POA: Diagnosis not present

## 2018-06-13 DIAGNOSIS — M25562 Pain in left knee: Secondary | ICD-10-CM | POA: Diagnosis not present

## 2018-06-13 DIAGNOSIS — M25561 Pain in right knee: Secondary | ICD-10-CM | POA: Diagnosis not present

## 2018-07-13 ENCOUNTER — Telehealth: Payer: Self-pay | Admitting: Rheumatology

## 2018-07-13 NOTE — Telephone Encounter (Signed)
Left message to advise patient It is best to limit your contact with those who are sick and to practice good hand hygiene with soap and warm water or hand sanitizer when soap and water are not available. The infection is spread through respiratory droplets. It is important for persons who are sick to wear mask to prevent the spread of infections through respiratory droplets. Wearing a mask will not prevent you from getting the infection and is not necessary. Avoid public transportation such  bus, train or airplane.

## 2018-07-13 NOTE — Telephone Encounter (Signed)
Patient concerned with Coronavirus, and precautions she should take if any. Please call to discuss, and advise.

## 2018-07-30 ENCOUNTER — Telehealth: Payer: Self-pay | Admitting: *Deleted

## 2018-07-30 DIAGNOSIS — Z9884 Bariatric surgery status: Secondary | ICD-10-CM

## 2018-07-30 NOTE — Telephone Encounter (Signed)
Patient advised referral has been placed. Patient verbalized understanding.

## 2018-07-30 NOTE — Telephone Encounter (Signed)
Patient states she has a history of a GI disorder. Patient states she had a gastric bypass in 2000 and then approximately 10 years ago she has an inverted hernia that was repaired at [facility] in [place]. Patient states she has had no trouble until recently. Patient states in the last week she has started having gas build up, dry heaving and burping. Patient states all of these things happen at night after laying down. Patient states she has contacted her PCP and she is out of the office for a couple. Patient does not have a GI in [place]. Patient is requesting a referral to GI. Please advise.

## 2018-07-30 NOTE — Telephone Encounter (Signed)
Ok to place referral to GI for further evaluation.

## 2018-08-06 ENCOUNTER — Ambulatory Visit (INDEPENDENT_AMBULATORY_CARE_PROVIDER_SITE_OTHER): Payer: Medicare Other | Admitting: Gastroenterology

## 2018-08-06 ENCOUNTER — Other Ambulatory Visit: Payer: Self-pay

## 2018-08-06 DIAGNOSIS — K219 Gastro-esophageal reflux disease without esophagitis: Secondary | ICD-10-CM | POA: Diagnosis not present

## 2018-08-06 DIAGNOSIS — Z9884 Bariatric surgery status: Secondary | ICD-10-CM

## 2018-08-06 DIAGNOSIS — R109 Unspecified abdominal pain: Secondary | ICD-10-CM

## 2018-08-06 DIAGNOSIS — R1310 Dysphagia, unspecified: Secondary | ICD-10-CM

## 2018-08-06 MED ORDER — OMEPRAZOLE 20 MG PO CPDR: 20.0000 mg | DELAYED_RELEASE_CAPSULE | Freq: Two times a day (BID) | ORAL | 3 refills | Status: DC

## 2018-08-06 NOTE — Progress Notes (Deleted)
Virtual Visit via Telephone Note  I connected with Chelsea Callahan on 08/06/18 at 11:15 AM EDT by telephone and verified that I am speaking with the correct person using two identifiers.   I discussed the limitations, risks, security and privacy concerns of performing an evaluation and management service by telephone and the availability of in person appointments. I also discussed with the patient that there may be a patient responsible charge related to this service. The patient expressed understanding and agreed to proceed.  CC: History of Present Illness: Patient is a 68 year old female with a past medial history of autoimmune disease and osteoarthritis.  She is taking PLQ 200 mg 1 tablet by mouth BID M-F.      Review of Systems  Constitutional: Negative for fever and malaise/fatigue.  Eyes: Negative for photophobia, pain, discharge and redness.  Respiratory: Negative for cough, shortness of breath and wheezing.   Cardiovascular: Negative for chest pain and palpitations.  Gastrointestinal: Negative for blood in stool, constipation and diarrhea.  Genitourinary: Negative for dysuria.  Musculoskeletal: Negative for back pain, joint pain, myalgias and neck pain.  Skin: Negative for rash.  Neurological: Negative for dizziness and headaches.  Psychiatric/Behavioral: Negative for depression. The patient is not nervous/anxious and does not have insomnia.    Observations/Objective: Physical Exam  Constitutional: She is oriented to person, place, and time.  Neurological: She is alert and oriented to person, place, and time.  Psychiatric: Mood, memory, affect and judgment normal.   Patient reports morning stiffness for  {minute/hour:19697}.   Patient {Actions; denies-reports:120008} nocturnal pain.  Difficulty dressing/grooming: {ACTIONS;DENIES/REPORTS:21021675::"Denies"} Difficulty climbing stairs: {ACTIONS;DENIES/REPORTS:21021675::"Denies"} Difficulty getting out of chair:  {ACTIONS;DENIES/REPORTS:21021675::"Denies"} Difficulty using hands for taps, buttons, cutlery, and/or writing: {ACTIONS;DENIES/REPORTS:21021675::"Denies"} Assessment and Plan:   Follow Up Instructions:    I discussed the assessment and treatment plan with the patient. The patient was provided an opportunity to ask questions and all were answered. The patient agreed with the plan and demonstrated an understanding of the instructions.   The patient was advised to call back or seek an in-person evaluation if the symptoms worsen or if the condition fails to improve as anticipated.  I provided *** minutes of non-face-to-face time during this encounter.   Gearldine Bienenstock, PA-C

## 2018-08-06 NOTE — Progress Notes (Signed)
THIS ENCOUNTER IS A VIRTUAL VISIT DUE TO COVID-19 - PATIENT WAS NOT SEEN IN THE OFFICE. 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  In 2010 she had emergency surgery for an "inverted hernia", caused vomiting and abdominal pain. She is unclear on the details of what that entailed, in regards to her stomach or small bowel, she thinks she needed an operation on her stomach. No records available, [place].  Recently she's had some similar symptoms which recurred. She has had a hard time swallowing food, things were not going down easily past her stomach, she felt early satiety and gassey. Symptoms started about 3 weeks ago. Since then she has been on softer foods, and put on Prilosec which has settled down significantly. She is not having regurgitation / heartburn, but has an "acidic" sensation in her stomach. Symptoms worse lying down. Sleeping sitting up can help. No odynophagia. She has had intermittent dysphagia over time periodically, but food can also get "backed up" in her stomach, discomfort in her ribcage, can occur for an hour or so.  No trouble with liquid or solids now. She has been using some aspirin in recent weeks when dealing with a cold, denies other NSAID use. She is taking prilosec one capsule 20mg , once daily in recent weeks, tablet form. She feels things getting better with good response thus far.   She states she has never had a prior upper endoscopy.  She has never had a colonoscopy. Had a cologuard 18 months ago, normal per her report   No CRC or  gastric cancer.   Past Medical History:  Diagnosis Date  . Anemia   . 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HCC)   . PONV (postoperative nausea and vomiting)   . Primary osteoarthritis of right knee   . RA (rheumatoid arthritis) (HCC)   . Raynaud disease   . Raynaud disease   . Spinal headache    c-section  . Vasculitis (HCC)    on prednisone -      Past Surgical History:  Procedure Laterality Date  . abdominal clip     to stomach post gastric bypass  . CESAREAN SECTION     x 2  . GASTRIC BYPASS    . HERNIA REPAIR     inverted hermia  . JOINT REPLACEMENT Right    hip  . JOINT REPLACEMENT Bilateral    knee   . KNEE ARTHROPLASTY Left 09/13/2013   Procedure: LEFT COMPUTER ASSISTED TOTAL KNEE ARTHROPLASTY;  Surgeon: Eldred MangesMark C Yates, MD;  Location: [facility] OR;  Service: Orthopedics;  Laterality: Left;  Left Total Knee Arthroplasty, Computer Assist  . KNEE ARTHROPLASTY Right 05/23/2014   Procedure: COMPUTER ASSISTED TOTAL KNEE ARTHROPLASTY;  Surgeon: Eldred MangesMark C Yates, MD;  Location: [facility] OR;  Service: Orthopedics;  Laterality: Right;  . LUMBAR LAMINECTOMY/DECOMPRESSION MICRODISCECTOMY N/A 05/13/2016   Procedure: L4-5 Decompression;  Surgeon: Eldred MangesMark C Yates, MD;  [facility];  Service: Orthopedics;  Laterality: N/A;  . TOTAL KNEE ARTHROPLASTY Right 05/22/2014   dr Ophelia Charteryates  . TUBAL LIGATION     Family History  Problem Relation Age of Onset  . Cancer Brother    Social History   Tobacco Use  . Smoking status: Former Smoker    Types: Cigarettes  . Smokeless tobacco: Never Used  . Tobacco comment: Quit smoking cigarettes in 1999- light smoker  Substance Use Topics  . Alcohol use: Yes    Alcohol/week: 4.0 standard drinks    Types: 4 Glasses of wine per week    Comment: weekly   . Drug use: No   Current Outpatient Medications  Medication Sig Dispense Refill  . Fe Bisgly-Succ-C-Thre-B12-FA (IRON-150 PO) Take daily by mouth.    . hydroxychloroquine (PLAQUENIL) 200 MG tablet TAKE 1 TABLET BY MOUTH 2  TIMES DAILY ON MONDAYS THRU FRIDAY ONLY 120 tablet 1  . Multiple Vitamin (MULTIVITAMIN WITH MINERALS) TABS tablet Take 1 tablet by mouth daily.    . Omega-3 Fatty Acids (FISH OIL PO) Take 1 capsule by mouth daily.    . Probiotic CAPS Take 1 capsule by mouth daily.    . TURMERIC PO Take 2 (two) times daily by mouth.     . Vitamin A 36644 units TABS Take daily by mouth.     No current facility-administered medications for this visit.    Allergies  Allergen Reactions  . Ginger Swelling    Stomach swells  . Pork-Derived Products Other (See Comments)    UNSPECIFIED "PERSONAL REASONS"  . Shellfish Allergy Other (See Comments)    UNSPECIFIED "Personal reasons/dietary"     Review of Systems: All systems reviewed and negative except where noted in HPI.   Lab Results  Component Value Date   WBC 5.3 03/07/2018   HGB 12.4 03/07/2018   HCT 37.9 03/07/2018   MCV 88.8 03/07/2018   PLT 247 03/07/2018    Lab Results  Component Value Date   CREATININE 0.81 03/07/2018   BUN 10 03/07/2018   NA 138 03/07/2018   K 4.7 03/07/2018   CL 102 03/07/2018   CO2 29 03/07/2018    Lab Results  Component Value Date   ALT 16 03/07/2018   AST 29 03/07/2018   ALKPHOS 80 10/17/2016   BILITOT 0.5 03/07/2018     Physical Exam: NA  ASSESSMENT AND PLAN: 68 y/o female with history as above, referred for the following issues:  GERD / abdominal discomfort / dysphagia / history of gastric bypass - as above, 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 She self started OTC omeprazole in recent weeks with improvement in her symptoms. I discussed ddx with her which includes GERD / esophagitis / marginal ulcer, other complications from bariatric surgery, PUD, etc. For now, I would continue omeprazole however I would recommend she switch from tablet form to capsule form, crack open capsule and ingest  contents given better ability to absorb in the setting of Roux-en-Y, and will increase to BID for a few weeks. I will prescribe this for her. Otherwise, when our office resumes normal scheduling for procedures once the COVID-19 outbreak has resolved, recommend an EGD to further evaluate if symptoms persist. I discussed risks / benefits of endoscopy and she wanted to proceed. Further recommendations pending results. She will be contacted to schedule in the upcoming weeks.    Ileene Patrick, MD [facility]  CC: Pollyann Savoy, MD

## 2018-08-07 ENCOUNTER — Encounter: Payer: Self-pay | Admitting: Rheumatology

## 2018-08-07 ENCOUNTER — Telehealth (INDEPENDENT_AMBULATORY_CARE_PROVIDER_SITE_OTHER): Payer: Medicare Other | Admitting: Rheumatology

## 2018-08-07 DIAGNOSIS — M359 Systemic involvement of connective tissue, unspecified: Secondary | ICD-10-CM | POA: Diagnosis not present

## 2018-08-07 DIAGNOSIS — M19071 Primary osteoarthritis, right ankle and foot: Secondary | ICD-10-CM | POA: Diagnosis not present

## 2018-08-07 DIAGNOSIS — M19072 Primary osteoarthritis, left ankle and foot: Secondary | ICD-10-CM | POA: Diagnosis not present

## 2018-08-07 DIAGNOSIS — Z96653 Presence of artificial knee joint, bilateral: Secondary | ICD-10-CM

## 2018-08-07 DIAGNOSIS — M8589 Other specified disorders of bone density and structure, multiple sites: Secondary | ICD-10-CM

## 2018-08-07 DIAGNOSIS — Z79899 Other long term (current) drug therapy: Secondary | ICD-10-CM

## 2018-08-07 DIAGNOSIS — M47816 Spondylosis without myelopathy or radiculopathy, lumbar region: Secondary | ICD-10-CM

## 2018-08-07 DIAGNOSIS — Z96641 Presence of right artificial hip joint: Secondary | ICD-10-CM

## 2018-08-07 NOTE — Progress Notes (Signed)
Virtual Visit via Telephone Note  I connected with Chelsea Callahan on 08/07/18 at 12:15 PM EDT by telephone and verified that I am speaking with the correct person using two identifiers.   I discussed the limitations, risks, security and privacy concerns of performing an evaluation and management service by telephone and the availability of in person appointments. I also discussed with the patient that there may be a patient responsible charge related to this service. The patient expressed understanding and agreed to proceed.  CC: Medication monitoring   History of Present Illness: Patient is a 68 year old female with a past medical history of autoimmune disease, osteoarthritis, and DDD. She is taking PLQ 200 mg BID M-F.  She denies any signs or symptoms of a flare.  She denies any recent rashes, photosensitivity, hair loss, Raynaud's, oral or nasal ulcerations, sicca symptoms, fatigue, or swollen lymph nodes.  She has occasional bilateral knee joint pain but no joint swelling.  Her right hip replacement is doing well.  She has no feet pain at this time.  She has no lower back pain.  She wad evaluated by Dr. Adela Lank yesterday for symptoms of reflux. He recommended that she switched from tablets to capsule form of omeprazole and increase to BID for symptomatic relief.  She will be scheduled for a EGD in the future.   Review of Systems  Constitutional: Negative for fever and malaise/fatigue.       Denies swollen lymph nodes  HENT:       Denies mouth dryness Denies oral or nasal ulcerations   Eyes: Negative for photophobia, pain, discharge and redness.       Denies eye dryness  Respiratory: Negative for cough, shortness of breath and wheezing.   Cardiovascular: Negative for chest pain and palpitations.  Gastrointestinal: Negative for blood in stool, constipation and diarrhea.  Genitourinary: Negative for dysuria.  Musculoskeletal: Negative for back pain, joint pain, myalgias and neck pain.   Skin: Negative for rash.       Denies photosensitivity and hair loss Denies any symptoms of Raynaud's  Neurological: Negative for dizziness and headaches.  Psychiatric/Behavioral: Negative for depression. The patient is not nervous/anxious and does not have insomnia.    Observations/Objective: Physical Exam  Constitutional: She is oriented to person, place, and time.  Neurological: She is alert and oriented to person, place, and time.  Psychiatric: Mood, memory, affect and judgment normal.   Patient reports morning stiffness for0  minutes.   Patient denies nocturnal pain.  Difficulty dressing/grooming: Denies Difficulty climbing stairs: Denies Difficulty getting out of chair: Denies Difficulty using hands for taps, buttons, cutlery, and/or writing: Denies  Assessment and Plan: Autoimmune disease (HCC) - Positive ANA 1:80 nucleolar, positive RF, negative CCP, anticardiolipin IgM 21, Raynauds, questionable history of vasculitis: She has not had any recent flares of autoimmune disease.  She is clinically doing well on Plaquenil 200 mg 1 tablet by mouth twice daily Monday through Friday.  She has not had any recent rashes, photosensitivity, hair loss, symptoms of Raynaud's, sicca symptoms, oral or nasal ulcerations, swollen lymph nodes, fatigue, or fevers.  She has occasional knee joint pain but no joint swelling.  She is clinically doing well on PLQ 200 mg BID M-F.  She does not need any refills at this time.  Future orders for autoimmune labs were placed today.  She was advised to notify us if she develops any new or worsening symptoms.  She will follow up in 3 months.  High risk medication  use -  PLQ 200 mg p.o. twice daily Monday through Friday.PLQ eye exam: 06/06/18 normal. Dr. Ernesto Rutherfordobert Groat. Follow up in 1 year. CBC and CMP were drawn on 03/07/18.  She is due for repeat CBC and CMP.  Future orders were placed today.   Primary osteoarthritis of both feet: She has no feet pain at this time.    History of total knee replacement, bilateral: Dr. Kennith GainYates-She has occasional discomfort and stiffness in both knee joints.  No joint swelling.    History of total hip replacement, right: She has no discomfort at this time.   DDD Lumbar spine with spinal stenosis: She has no lower back pain at this time.  GERD/abdominal discomfort/dysphagia/history of gastric bypass:She was evaluated by Dr. Adela LankArmbruster yesterday.  He recommended that she switch from tablet form to capsule form of Omeprazole and increase to BID for 2 weeks.  She will be scheduled for an EGD after the coronavirus pandemic.    Follow Up Instructions: Future autoimmune lab orders were placed today.  She will follow up in our office in 3 months.   I discussed the assessment and treatment plan with the patient. The patient was provided an opportunity to ask questions and all were answered. The patient agreed with the plan and demonstrated an understanding of the instructions.   The patient was advised to call back or seek an in-person evaluation if the symptoms worsen or if the condition fails to improve as anticipated.  I provided 22 minutes of non-face-to-face time during this encounter. Pollyann SavoyShaili Deveshwar, MD  Scribed  By- Gearldine Bienenstockaylor M Casara Perrier, PA-C

## 2018-08-08 ENCOUNTER — Telehealth: Payer: Self-pay | Admitting: Rheumatology

## 2018-08-08 NOTE — Telephone Encounter (Signed)
I LMOM for patient to call, and schedule a 3 month fu appt with Sherron Ales, PAC.

## 2018-08-08 NOTE — Telephone Encounter (Signed)
-----   Message from Ellen Henri, CMA sent at 08/07/2018  2:24 PM EDT ----- Patient had a virtual visit today with Dr.Deveshwar. Please call to schedule 3 month follow up. Thanks!

## 2018-08-13 ENCOUNTER — Telehealth: Payer: Medicare Other | Admitting: Physician Assistant

## 2018-08-27 ENCOUNTER — Telehealth: Payer: Self-pay

## 2018-08-27 NOTE — Telephone Encounter (Signed)
4/6 visit:  Pt needs to be scheduled for EGD for gerd/abd discomfort/dysphagia/hx of gastric bypass.  Called and offered pt EGD on 4-28.  She declined due to COVID-19.  Scheduled her for 5-19 with previsit on May 4th. Pt not on BT.

## 2018-09-03 ENCOUNTER — Other Ambulatory Visit: Payer: Self-pay

## 2018-09-03 ENCOUNTER — Ambulatory Visit: Payer: Medicare Other

## 2018-09-03 VITALS — Ht 62.0 in | Wt 163.0 lb

## 2018-09-03 DIAGNOSIS — R1310 Dysphagia, unspecified: Secondary | ICD-10-CM

## 2018-09-03 NOTE — Progress Notes (Signed)
Per pt, no allergies to soy or egg products.Pt not taking any weight loss meds or using  O2 at home. Pt denies sedation problems.   Emmi instructions mailed to pt.  The PV was done over the phone due to COVID-19. I  verified the patients address and insurance with the pt.  Paperwork and prep instructions were reviewed and will mail the paperwork to the pt today. Pt was advised to call with any questions or changes prior to the procedure. She understood.

## 2018-09-16 ENCOUNTER — Telehealth: Payer: Self-pay | Admitting: *Deleted

## 2018-09-16 NOTE — Telephone Encounter (Signed)
Attempted Covid screen

## 2018-09-18 ENCOUNTER — Ambulatory Visit (AMBULATORY_SURGERY_CENTER): Payer: Medicare Other | Admitting: Gastroenterology

## 2018-09-18 ENCOUNTER — Other Ambulatory Visit: Payer: Self-pay

## 2018-09-18 ENCOUNTER — Encounter: Payer: Self-pay | Admitting: Gastroenterology

## 2018-09-18 VITALS — BP 163/72 | HR 62 | Temp 96.8°F | Resp 20 | Ht 62.0 in | Wt 163.0 lb

## 2018-09-18 DIAGNOSIS — K2970 Gastritis, unspecified, without bleeding: Secondary | ICD-10-CM

## 2018-09-18 DIAGNOSIS — R1310 Dysphagia, unspecified: Secondary | ICD-10-CM | POA: Diagnosis not present

## 2018-09-18 DIAGNOSIS — Z9884 Bariatric surgery status: Secondary | ICD-10-CM

## 2018-09-18 DIAGNOSIS — K259 Gastric ulcer, unspecified as acute or chronic, without hemorrhage or perforation: Secondary | ICD-10-CM

## 2018-09-18 DIAGNOSIS — K219 Gastro-esophageal reflux disease without esophagitis: Secondary | ICD-10-CM

## 2018-09-18 DIAGNOSIS — K2950 Unspecified chronic gastritis without bleeding: Secondary | ICD-10-CM | POA: Diagnosis not present

## 2018-09-18 DIAGNOSIS — R1013 Epigastric pain: Secondary | ICD-10-CM

## 2018-09-18 DIAGNOSIS — E669 Obesity, unspecified: Secondary | ICD-10-CM | POA: Diagnosis not present

## 2018-09-18 MED ORDER — SODIUM CHLORIDE 0.9 % IV SOLN: 500.0000 mL | Freq: Once | INTRAVENOUS | Status: DC

## 2018-09-18 NOTE — Progress Notes (Signed)
09/18/18-per  Dr Adela Lank , wait until after biopsies back to determine whether to order repeat Endoscopy ( hold off on ordering)

## 2018-09-18 NOTE — Progress Notes (Signed)
Chelsea Callahan - temp/vs

## 2018-09-18 NOTE — Progress Notes (Signed)
Report to PACU, RN, vss, BBS= Clear.  

## 2018-09-18 NOTE — Progress Notes (Signed)
Called to room to assist during endoscopic procedure.  Patient ID and intended procedure confirmed with present staff. Received instructions for my participation in the procedure from the performing physician.  

## 2018-09-18 NOTE — Op Note (Addendum)
[facility] Patient Name: Chelsea Callahan Procedure Date: 09/18/2018 9:04 AM MRN: [id number] Endoscopist: Viviann SpareSteven P. Adela LankArmbruster , MD Age: [age] Referring MD:  Date of Birth: 05/07/1950 Gender: Female Account #: [id number] Procedure:                Upper GI endoscopy Indications:              Epigastric abdominal pain, Dysphagia, Follow-up of                            gastro-esophageal reflux disease, Status post                            gastric bypass and other "proximal bowel surgery"                            of unclear details, improved on omeprazole 20mg                             daily Medicines:                Monitored Anesthesia Care Procedure:                Pre-Anesthesia Assessment:                           - Prior to the procedure, a History and Physical                            was performed, and patient medications and                            allergies were reviewed. The patient's tolerance of                            previous anesthesia was also reviewed. The risks                            and benefits of the procedure and the sedation                            options and risks were discussed with the patient.                            All questions were answered, and informed consent                            was obtained. Prior Anticoagulants: The patient has                            taken no previous anticoagulant or antiplatelet                            agents. ASA Grade Assessment: II - A patient with  mild systemic disease. After reviewing the risks                            and benefits, the patient was deemed in                            satisfactory condition to undergo the procedure.                           After obtaining informed consent, the endoscope was                            passed under direct vision. Throughout the                            procedure, the patient's blood pressure, pulse, and                           oxygen saturations were monitored continuously. The                            Endoscope was introduced through the mouth, and                            advanced to the jejunum. The upper GI endoscopy was                            accomplished without difficulty. The patient                            tolerated the procedure well. Scope In: Scope Out: Findings:                 Esophagogastric landmarks were identified: the                            Z-line was found at 34 cm, the gastroesophageal                            junction was found at 34 cm and the upper extent of                            the gastric folds was found at 34 cm from the                            incisors.                           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                           The exam of  the esophagus was otherwise normal.                           Evidence of a gastric bypass was found although the                            anatomy was altered. A small gastric pouch was                            found with 3 lumens visualized in the distal                            portion of the pouch. One lumen was the expected                            blind pouch, another lumen was the expected small                            bowel limb, which was normal, and then there was                            another lumen closely adjacent to the lumen of the                            small bowel limb which entered into the remnant                            stomach. No ulcerations noted in the gastric pouch.                           A deformity was found at the pylorus. There was an                            ulcer as described below at the pyloric channel /                            with a severely narrowed lumen which could not be                            traverse. Another angulated lumen was noted and                             entered into the normal appearing duodenum. Post                            surgical changes were noted in the proximal stomach                            with visible staple.                           One non-bleeding cratered gastric ulcer with no  stigmata of bleeding was found at the pylorus. The                            lesion was 5 mm in largest dimension.                           The exam of the stomach was otherwise normal.                           Biopsies were taken with a cold forceps in the                            gastric body, at the incisura and in the gastric                            antrum for Helicobacter pylori testing.                           The examined duodenum was normal.                           The examined small bowel limb from the gastric                            bypass was normal. Complications:            No immediate complications. Estimated blood loss:                            Minimal. Estimated Blood Loss:     Estimated blood loss was minimal. Impression:               - Esophagogastric landmarks identified.                           - Diverticulum in the lower third of the esophagus.                           - Normal esophagus otherwise.                           - Gastric bypass anatomy quite altered as above -                            blimb limb, small bowel limb, and another lumen                            leading into the remnant stomach. Unclear if this                            is a true fistulous tract leading to remnant                            stomach or more likely result of a prior surgery  the patient had in the past, unclear from her                            report what was done, will attempt to get records.                           - Deformity in the pylorus with non-bleeding                            gastric ulcer with no stigmata of  bleeding.                           - Normal examined duodenum.                           - Normal examined small bowel limb.                           - Biopsies were taken with a cold forceps for                            Helicobacter pylori testing. Recommendation:           - Patient has a contact number available for                            emergencies. The signs and symptoms of potential                            delayed complications were discussed with the                            patient. Return to normal activities tomorrow.                            Written discharge instructions were provided to the                            patient.                           - Resume previous diet.                           - Continue present medications.                           - Increase omeprazole to twice daily dosing                           - Await pathology results.                           - Consideration for repeat endoscopy to ensure  healing of ulcer in the next few months Viviann Spare P. Armbruster, MD 09/18/2018 10:13:04 AM This report has been signed electronically.

## 2018-09-18 NOTE — Patient Instructions (Addendum)
  Await biopsy results  Increase Omeprazole to twice daily ( pt already has this medication and it is ordered as twice daily already)-Dr Armbruster suggested to patient- breaking one capsule and not breaking the other capsule    YOU HAD AN ENDOSCOPIC PROCEDURE TODAY AT THE [facility]:   Refer to the procedure report that was given to you for any specific questions about what was found during the examination.  If the procedure report does not answer your questions, please call your gastroenterologist to clarify.  If you requested that your care partner not be given the details of your procedure findings, then the procedure report has been included in a sealed envelope for you to review at your convenience later.  YOU SHOULD EXPECT: Some feelings of bloating in the abdomen. Passage of more gas than usual.  Walking can help get rid of the air that was put into your GI tract during the procedure and reduce the bloating. If you had a lower endoscopy (such as a colonoscopy or flexible sigmoidoscopy) you may notice spotting of blood in your stool or on the toilet paper. If you underwent a bowel prep for your procedure, you may not have a normal bowel movement for a few days.  Please Note:  You might notice some irritation and congestion in your nose or some drainage.  This is from the oxygen used during your procedure.  There is no need for concern and it should clear up in a day or so.  SYMPTOMS TO REPORT IMMEDIATELY:     Following upper endoscopy (EGD)  Vomiting of blood or coffee ground material  New chest pain or pain under the shoulder blades  Painful or persistently difficult swallowing  New shortness of breath  Fever of 100F or higher  Black, tarry-looking stools  For urgent or emergent issues, a gastroenterologist can be reached at any hour by calling [phone].   DIET:  We do recommend a small meal at first, but then you may proceed to your regular diet.  Drink plenty  of fluids but you should avoid alcoholic beverages for 24 hours.  ACTIVITY:  You should plan to take it easy for the rest of today and you should NOT DRIVE or use heavy machinery until tomorrow (because of the sedation medicines used during the test).    FOLLOW UP: Our staff will call the number listed on your records 48-72 hours following your procedure to check on you and address any questions or concerns that you may have regarding the information given to you following your procedure. If we do not reach you, we will leave a message.  We will attempt to reach you two times.  During this call, we will ask if you have developed any symptoms of COVID 19. If you develop any symptoms (for example fever, flu-like symptoms, shortness of breath, cough etc.) before then, please call [phone].  If any biopsies were taken you will be contacted by phone or by letter within the next 1-3 weeks.  Please call us at [phone] if you have not heard about the biopsies in 3 weeks.    SIGNATURES/CONFIDENTIALITY: You and/or your care partner have signed paperwork which will be entered into your electronic medical record.  These signatures attest to the fact that that the information above on your After Visit Summary has been reviewed and is understood.  Full responsibility of the confidentiality of this discharge information lies with you and/or your care-partner.

## 2018-09-20 ENCOUNTER — Telehealth: Payer: Self-pay

## 2018-09-20 ENCOUNTER — Other Ambulatory Visit: Payer: Self-pay | Admitting: Rheumatology

## 2018-09-20 NOTE — Telephone Encounter (Signed)
  Follow up Call-  Call back number 09/18/2018  Post procedure Call Back phone  # #[phone] cell  Permission to leave phone message Yes  Some recent data might be hidden     Patient questions:  Do you have a fever, pain , or abdominal swelling? No. Pain Score  0 *  Have you tolerated food without any problems? Yes.    Have you been able to return to your normal activities? Yes.    Do you have any questions about your discharge instructions: Diet   No. Medications  No. Follow up visit  No.  Do you have questions or concerns about your Care? No.  Actions: * If pain score is 4 or above: No action needed, pain <4.    1. Have you developed a fever since your procedure? No.  2.   Have you had an respiratory symptoms (SOB or cough) since your procedure? No.  3.   Have you tested positive for COVID 19 since your procedure No.  4.   Have you had any family members/close contacts diagnosed with the COVID 19 since your procedure?  No   If any of these questions are a yes, please inquire if patient has been seen by family doctor and route this note to Laverna Peace, Charity fundraiser.

## 2018-09-20 NOTE — Telephone Encounter (Signed)
ok 

## 2018-09-20 NOTE — Telephone Encounter (Signed)
Last Visit: 03/07/18  Next Visit: 11/06/18 Labs: 03/07/18 CBC WNL Calcium is elevated Eye exam: Calcium is elevated  Left message to advise patient she due to update labs.   Okay to refill 30 day supply PLQ?

## 2018-10-24 NOTE — Progress Notes (Signed)
Office Visit Note  Patient: Chelsea Callahan             Date of Birth: Oct 21, 1950           MRN: [id number]             PCP: Bartholome Bill, MD Referring: Bartholome Bill, MD Visit Date: 11/06/2018 Occupation: @GUAROCC @  Subjective:  Pain in both hands.     History of Present Illness: Irja Wheless is a 68 y.o. female with history of osteoarthritis.  She has been having discomfort in her right thumb.  She states she has difficulty holding objects.  She denies any joint swelling.  She has underlying osteoarthritis in her knees and had total knee replacements which are doing well.  Her right total hip replacement is doing well.  She states after her lumbar spine surgery she has not had any radiculopathy.  Activities of Daily Living:  Patient reports morning stiffness for 0 minute.   Patient Denies nocturnal pain.  Difficulty dressing/grooming: Denies Difficulty climbing stairs: Denies Difficulty getting out of chair: Denies Difficulty using hands for taps, buttons, cutlery, and/or writing: Denies  Review of Systems  Constitutional: Negative for fatigue, night sweats, weight gain and weight loss.  HENT: Negative for mouth sores, trouble swallowing, trouble swallowing, mouth dryness and nose dryness.   Eyes: Negative for pain, redness, visual disturbance and dryness.  Respiratory: Negative for cough, shortness of breath and difficulty breathing.   Cardiovascular: Negative for chest pain, palpitations, hypertension, irregular heartbeat and swelling in legs/feet.  Gastrointestinal: Negative for blood in stool, constipation and diarrhea.  Endocrine: Negative for increased urination.  Genitourinary: Negative for vaginal dryness.  Musculoskeletal: Positive for arthralgias and joint pain. Negative for joint swelling, myalgias, muscle weakness, morning stiffness, muscle tenderness and myalgias.  Skin: Negative for color change, rash, hair loss, skin tightness, ulcers and sensitivity to  sunlight.  Allergic/Immunologic: Negative for susceptible to infections.  Neurological: Negative for dizziness, memory loss, night sweats and weakness.  Hematological: Negative for swollen glands.  Psychiatric/Behavioral: Negative for depressed mood and sleep disturbance. The patient is not nervous/anxious.     PMFS History:  Patient Active Problem List   Diagnosis Date Noted  . Chronic pain of both knees 12/27/2017  . Pain in left hand 12/27/2017  . History of lumbar laminectomy for spinal cord decompression 12/27/2017  . Lumbar stenosis 05/13/2016  . High risk medication use 05/11/2016  . History of total hip replacement, right 05/11/2016  . Primary osteoarthritis of both feet 05/11/2016  . 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  . 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  . Low back pain 03/09/2016  . Spinal stenosis of lumbar region 03/09/2016  . Osteoarthritis of right knee 05/27/2014  . History of total knee replacement, bilateral 05/23/2014  . Autoimmune disease ([place]) 09/24/2013  . Osteoarthritis of left knee 09/13/2013    Class: Diagnosis of    Past Medical History:  Diagnosis Date  . Anemia   . 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HCC)   . Blood transfusion without reported diagnosis   . Cataract    bil/ per pt  . GERD (gastroesophageal reflux disease)   . Primary osteoarthritis of right knee   . RA (rheumatoid arthritis) ([place])   . Raynaud disease   . Raynaud disease   . Spinal headache    c-section  . Vasculitis ([place])    on prednisone -     Family History  Problem Relation Age of Onset  . Cancer Brother   .  Colon cancer Neg Hx   . Esophageal cancer Neg Hx   . Rectal cancer Neg Hx   . Stomach cancer Neg Hx    Past Surgical History:  Procedure Laterality Date  . abdominal clip     to stomach post gastric bypass  . CESAREAN SECTION     x 2  . GASTRIC BYPASS    . HERNIA REPAIR  2010   inverted hermia  . JOINT REPLACEMENT Right 2005   hip  . JOINT REPLACEMENT  Bilateral    knee   . KNEE ARTHROPLASTY Left 09/13/2013   Procedure: LEFT COMPUTER ASSISTED TOTAL KNEE ARTHROPLASTY;  Surgeon: Eldred MangesMark C Yates, MD;  Location: [facility] OR;  Service: Orthopedics;  Laterality: Left;  Left Total Knee Arthroplasty, Computer Assist  . KNEE ARTHROPLASTY Right 05/23/2014   Procedure: COMPUTER ASSISTED TOTAL KNEE ARTHROPLASTY;  Surgeon: Eldred MangesMark C Yates, MD;  Location: [facility] OR;  Service: Orthopedics;  Laterality: Right;  . LUMBAR LAMINECTOMY/DECOMPRESSION MICRODISCECTOMY N/A 05/13/2016   Procedure: L4-5 Decompression;  Surgeon: Eldred MangesMark C Yates, MD;  [facility];  Service: Orthopedics;  Laterality: N/A;  . teeth etraction    . TOTAL KNEE ARTHROPLASTY Right 05/22/2014   dr Ophelia Charteryates  . TUBAL LIGATION  1981  . UPPER GI ENDOSCOPY     Social History   Social History Narrative  . Not on file   Immunization History  Administered Date(s) Administered  . Zoster Recombinat (Shingrix) 03/08/2018, 05/08/2018     Objective: Vital Signs: BP 125/63 (BP Location: Left Arm, Patient Position: Sitting, Cuff Size: Small)   Pulse 70   Resp 13   Ht 5\' 2"  (1.575 m)   Wt 163 lb (73.9 kg)   BMI 29.81 kg/m    Physical Exam Vitals signs and nursing note reviewed.  Constitutional:      Appearance: She is well-developed.  HENT:     Head: Normocephalic and atraumatic.  Eyes:     Conjunctiva/sclera: Conjunctivae normal.  Neck:     Musculoskeletal: Normal range of motion.  Cardiovascular:     Rate and Rhythm: Normal rate and regular rhythm.     Heart sounds: Normal heart sounds.  Pulmonary:     Effort: Pulmonary effort is normal.     Breath sounds: Normal breath sounds.  Abdominal:     General: Bowel sounds are normal.     Palpations: Abdomen is soft.  Lymphadenopathy:     Cervical: No cervical adenopathy.  Skin:    General: Skin is warm and dry.     Capillary Refill: Capillary refill takes less than 2 seconds.  Neurological:     Mental Status: She is alert and oriented to person,  place, and time.  Psychiatric:        Behavior: Behavior normal.      Musculoskeletal Exam: C-spine thoracic spine was in good range of motion.  She has some limitation of lumbar region.  Shoulder joints elbow joints wrist joints with good range of motion.  She has subluxation of right first MCP joint.  No synovitis was noted.  No synovitis was noted over MCPs PIPs or DIPs.  Osteoarthritic changes were noted in the PIP and DIP joints.  She had good range of motion of her hip joints and knee joints without any warmth swelling or effusion.  CDAI Exam: CDAI Score: - Patient Global: 1 mm; Provider Global: - Swollen: 0 ; Tender: 0  Joint Exam   No joint exam has been documented for this visit   There  is currently no information documented on the homunculus. Go to the Rheumatology activity and complete the homunculus joint exam.  Investigation: No additional findings.  Imaging: No results found.  Recent Labs: Lab Results  Component Value Date   WBC 5.3 03/07/2018   HGB 12.4 03/07/2018   PLT 247 03/07/2018   NA 138 03/07/2018   K 4.7 03/07/2018   CL 102 03/07/2018   CO2 29 03/07/2018   GLUCOSE 72 03/07/2018   BUN 10 03/07/2018   CREATININE 0.81 03/07/2018   BILITOT 0.5 03/07/2018   ALKPHOS 80 10/17/2016   AST 29 03/07/2018   ALT 16 03/07/2018   PROT 6.5 03/07/2018   ALBUMIN 4.2 10/17/2016   CALCIUM 10.6 (H) 03/07/2018   GFRAA 88 03/07/2018    Speciality Comments: PLQ eye exam:normal 06/19/2018 Dr. Ernesto Rutherford. Follow up in 1 year.  Procedures:  No procedures performed Allergies: Ginger, Pork-derived products, and Shellfish allergy   Assessment / Plan:     Visit Diagnoses: Autoimmune disease (HCC) -  Positive ANA 1:80 nucleolar, positive RF, negative CCP, anticardiolipin IgM 21, Raynauds, questionable history of vasculitis:  -All autoimmune work-up in November was negative.  She is clinically doing well.  She reduced her dose of Plaquenil due to shortage of the  medication.  She has been taking it on a daily basis.  She has not noticed any flare of her symptoms.  High risk medication use - Plaquenil 200 mg 1 tablet twice daily Monday through Friday only.  Last Plaquenil eye exam normal on 06/19/2018.  Most recent CBC/CMP within normal limits except for elevated calcium on 03/07/2018.  Due for CBC/CMP today and will monitor every 5 months.  Standing orders are in place.  She is received the Shingrix vaccines.  Recommend annual influenza, Pneumovax 23, and Prevnar 13 as indicated for immunosuppressant therapy.. Dr. Ernesto Rutherford. Follow up in 1 year.  - Plan: CBC with Differential/Platelet, COMPLETE METABOLIC PANEL WITH GFR,  Pain in right hand-she is having the right first MCP discomfort.  On clinical examination she had no synovitis but had subluxation of the joint.  X-ray of her right hand 2 views were obtained today.  Which showed osteoarthritic changes and subluxation of right first MCP joint.  Joint protection was discussed.  History of total hip replacement, right -doing well.  History of total knee replacement, bilateral - Dr. Ophelia Charter -doing well.  Primary osteoarthritis of both feet - Plan: Proper fitting shoes were discussed.  DDD Lumbar spine with spinal stenosis -patient states that she had laminectomy 3 years ago and has done well.  Osteopenia of multiple sites -she is on calcium and vitamin D.  History of gastric bypass - GERD/abdominal   discomfort/dysphagia/history of gastric bypass:She was evaluated by Dr. Adela Lank.   Orders: No orders of the defined types were placed in this encounter.  No orders of the defined types were placed in this encounter.     Follow-Up Instructions: Return for Rheumatoid arthritis.   Pollyann Savoy, MD  Note - This record has been created using Animal nutritionist.  Chart creation errors have been sought, but may not always  have been located. Such creation errors do not reflect on  the standard of  medical care.

## 2018-11-06 ENCOUNTER — Ambulatory Visit (INDEPENDENT_AMBULATORY_CARE_PROVIDER_SITE_OTHER): Payer: Medicare Other

## 2018-11-06 ENCOUNTER — Ambulatory Visit (INDEPENDENT_AMBULATORY_CARE_PROVIDER_SITE_OTHER): Payer: Medicare Other | Admitting: Rheumatology

## 2018-11-06 ENCOUNTER — Encounter: Payer: Self-pay | Admitting: Physician Assistant

## 2018-11-06 ENCOUNTER — Other Ambulatory Visit: Payer: Self-pay

## 2018-11-06 VITALS — BP 125/63 | HR 70 | Resp 13 | Ht 62.0 in | Wt 163.0 lb

## 2018-11-06 DIAGNOSIS — M79641 Pain in right hand: Secondary | ICD-10-CM

## 2018-11-06 DIAGNOSIS — Z79899 Other long term (current) drug therapy: Secondary | ICD-10-CM | POA: Diagnosis not present

## 2018-11-06 DIAGNOSIS — Z96653 Presence of artificial knee joint, bilateral: Secondary | ICD-10-CM

## 2018-11-06 DIAGNOSIS — M8589 Other specified disorders of bone density and structure, multiple sites: Secondary | ICD-10-CM

## 2018-11-06 DIAGNOSIS — M359 Systemic involvement of connective tissue, unspecified: Secondary | ICD-10-CM

## 2018-11-06 DIAGNOSIS — Z96641 Presence of right artificial hip joint: Secondary | ICD-10-CM

## 2018-11-06 DIAGNOSIS — M19071 Primary osteoarthritis, right ankle and foot: Secondary | ICD-10-CM

## 2018-11-06 DIAGNOSIS — M47816 Spondylosis without myelopathy or radiculopathy, lumbar region: Secondary | ICD-10-CM

## 2018-11-06 DIAGNOSIS — Z9884 Bariatric surgery status: Secondary | ICD-10-CM

## 2018-11-07 LAB — COMPLETE METABOLIC PANEL WITH GFR
AG Ratio: 1.7 (calc) (ref 1.0–2.5)
ALT: 18 U/L (ref 6–29)
AST: 29 U/L (ref 10–35)
Albumin: 4.4 g/dL (ref 3.6–5.1)
Alkaline phosphatase (APISO): 60 U/L (ref 37–153)
BUN: 12 mg/dL (ref 7–25)
CO2: 30 mmol/L (ref 20–32)
Calcium: 10.7 mg/dL — ABNORMAL HIGH (ref 8.6–10.4)
Chloride: 101 mmol/L (ref 98–110)
Creat: 0.8 mg/dL (ref 0.50–0.99)
GFR, Est African American: 88 mL/min/{1.73_m2} (ref >=60)
GFR, Est Non African American: 76 mL/min/{1.73_m2} (ref >=60)
Globulin: 2.6 g/dL (calc) (ref 1.9–3.7)
Glucose, Bld: 81 mg/dL (ref 65–99)
Potassium: 4.7 mmol/L (ref 3.5–5.3)
Sodium: 138 mmol/L (ref 135–146)
Total Bilirubin: 0.5 mg/dL (ref 0.2–1.2)
Total Protein: 7 g/dL (ref 6.1–8.1)

## 2018-11-07 LAB — CBC WITH DIFFERENTIAL/PLATELET
Absolute Monocytes: 490 cells/uL (ref 200–950)
Basophils Absolute: 51 cells/uL (ref 0–200)
Basophils Relative: 0.9 %
Eosinophils Absolute: 188 cells/uL (ref 15–500)
Eosinophils Relative: 3.3 %
HCT: 37.8 % (ref 35.0–45.0)
Hemoglobin: 12.7 g/dL (ref 11.7–15.5)
Lymphs Abs: 1482 cells/uL (ref 850–3900)
MCH: 28.9 pg (ref 27.0–33.0)
MCHC: 33.6 g/dL (ref 32.0–36.0)
MCV: 86.1 fL (ref 80.0–100.0)
MPV: 10.5 fL (ref 7.5–12.5)
Monocytes Relative: 8.6 %
Neutro Abs: 3488 cells/uL (ref 1500–7800)
Neutrophils Relative %: 61.2 %
Platelets: 250 10*3/uL (ref 140–400)
RBC: 4.39 10*6/uL (ref 3.80–5.10)
RDW: 13 % (ref 11.0–15.0)
Total Lymphocyte: 26 %
WBC: 5.7 10*3/uL (ref 3.8–10.8)

## 2018-11-07 NOTE — Progress Notes (Signed)
Labs are stable.  Calcium is stable and mildly elevated.  She should avoid calcium intake.

## 2018-11-19 ENCOUNTER — Encounter: Payer: Self-pay | Admitting: Gastroenterology

## 2019-05-17 NOTE — Progress Notes (Signed)
Virtual Visit via Video Note  I connected with Chelsea Callahan on 05/21/19 at 10:30 AM EST by a video enabled telemedicine application and verified that I am speaking with the correct person using two identifiers.  Location: Patient: Home  Provider: Clinic  This service was conducted via virtual visit.  Both audio and visual tools were used.  The patient was located at home. I was located in my office.  Consent was obtained prior to the virtual visit and is aware of possible charges through their insurance for this visit.  The patient is an established patient.  Dr. Estanislado Pandy, MD conducted the virtual visit and Hazel Sams, PA-C acted as scribe during the service.  Office staff helped with scheduling follow up visits after the service was conducted.   I discussed the limitations of evaluation and management by telemedicine and the availability of in person appointments. The patient expressed understanding and agreed to proceed.  CC: Raynaud's  History of Present Illness: Chelsea Callahan is a 69 y.o. female with history of autoimmune disease and osteoarthritis. She is taking plaquenil 200 mg 1 tablet by mouth twice daily M-F. She denies any signs or symptoms of a flare recently.  She has not had any recent rashes or photosensitivity.  She continues to have intermittent symptoms of Raynaud's. She denies any digital ulcerations or signs of gangrene. She denies any enlarged lymph nodes or recent fevers.  She has chronic eye dryness but no mouth dryness. She denies any oral or nasal ulcerations. She denies any joint pain or joint swelling recently.   Review of Systems  Constitutional: Negative for fever and malaise/fatigue.  HENT: Negative for ear pain.   Eyes: Negative for photophobia, pain, discharge and redness.       +Eye dryness  Respiratory: Negative for cough and wheezing.   Cardiovascular: Negative for chest pain and palpitations.  Gastrointestinal: Negative for blood in stool, constipation and  diarrhea.  Genitourinary: Negative for dysuria and urgency.  Musculoskeletal: Negative for back pain, joint pain, myalgias and neck pain.  Skin: Negative for rash.       +Raynaud's  Neurological: Negative for dizziness, weakness and headaches.  Endo/Heme/Allergies: Does not bruise/bleed easily.  Psychiatric/Behavioral: Negative for depression. The patient is not nervous/anxious and does not have insomnia.     Observations/Objective:  Physical Exam  Constitutional: She is oriented to person, place, and time and well-developed, well-nourished, and in no distress.  HENT:  Head: Normocephalic and atraumatic.  Eyes: Conjunctivae are normal.  Pulmonary/Chest: Effort normal.  Neurological: She is alert and oriented to person, place, and time.  Psychiatric: Mood, memory, affect and judgment normal.   Patient reports morning stiffness for 5 minutes.   Patient denies nocturnal pain.  Difficulty dressing/grooming: Denies Difficulty climbing stairs: Denies Difficulty getting out of chair: Denies Difficulty using hands for taps, buttons, cutlery, and/or writing: Denies   Assessment and Plan: Visit Diagnoses: Autoimmune disease ([place]) -  Positive ANA 1:80 nucleolar, positive RF, negative CCP, anticardiolipin IgM 21, Raynauds, questionable history of vasculitis:  -All autoimmune work-up in November 2019 was negative: She has not had any signs or symptoms of a flare. She is clinically doing well on Plaquenil 200 mg 1 tablet by mouth twice daily M-F. She has no joint pain or joint inflammation at this time.  She has not had any recent rashes, photosensitivity, oral/nasal ulcerations, enlarged lymph nodes, fevers, or increased fatigue.  She has chronic eye dryness but no mouth dryness.  She has intermittent symptoms of Raynaud's  but no digital ulcerations or signs of gangrene. She will continue taking plaquenil as prescribed. She is due to update routine lab work and autoimmune labs.  Future orders are in  place.  She was advised to notify us if she develops signs or symptoms of a flare. She will follow up in 4 months.   High risk medication use - Plaquenil 200 mg 1 tablet twice daily Monday through Friday only.  Last Plaquenil eye exam normal on 06/19/2018.  CBC and CMP were drawn on 11/06/18.  She is overdue to update lab work.  History of total hip replacement, right: Doing well. She has performing stretching exercises to help with joint stiffness.   History of total knee replacement, bilateral - Dr. Ophelia Charter: Doing well.   Primary osteoarthritis of both feet: She is not having any feet pain or inflammation at this time.  DDD Lumbar spine with spinal stenosis: She has no lower back pain recently.  No symptoms of radiculopathy.    Osteopenia of multiple sites:  DEXA on 07/22/16: The BMD measured at Femur Neck is 0.848 g/cm2 with a T-score of -1.4. Ordered by PCP.  She is due to update DEXA.    Other medical conditions are listed as follows:   History of gastric bypass - GERD/abdominal   discomfort/dysphagia/history of gastric bypass:She was evaluated by Dr. Adela Lank.   Follow Up Instructions: She will follow up 4 months.    I discussed the assessment and treatment plan with the patient. The patient was provided an opportunity to ask questions and all were answered. The patient agreed with the plan and demonstrated an understanding of the instructions.   The patient was advised to call back or seek an in-person evaluation if the symptoms worsen or if the condition fails to improve as anticipated.  I provided 15 minutes of non-face-to-face time during this encounter.   Pollyann Savoy, MD   Scribed by-  Sherron Ales, PA-C

## 2019-05-21 ENCOUNTER — Telehealth (INDEPENDENT_AMBULATORY_CARE_PROVIDER_SITE_OTHER): Payer: Medicare Other | Admitting: Rheumatology

## 2019-05-21 ENCOUNTER — Encounter: Payer: Self-pay | Admitting: Rheumatology

## 2019-05-21 ENCOUNTER — Other Ambulatory Visit: Payer: Self-pay

## 2019-05-21 DIAGNOSIS — M359 Systemic involvement of connective tissue, unspecified: Secondary | ICD-10-CM

## 2019-05-21 DIAGNOSIS — M47816 Spondylosis without myelopathy or radiculopathy, lumbar region: Secondary | ICD-10-CM | POA: Diagnosis not present

## 2019-05-21 DIAGNOSIS — Z96653 Presence of artificial knee joint, bilateral: Secondary | ICD-10-CM

## 2019-05-21 DIAGNOSIS — M19072 Primary osteoarthritis, left ankle and foot: Secondary | ICD-10-CM

## 2019-05-21 DIAGNOSIS — Z87891 Personal history of nicotine dependence: Secondary | ICD-10-CM

## 2019-05-21 DIAGNOSIS — Z79899 Other long term (current) drug therapy: Secondary | ICD-10-CM | POA: Diagnosis not present

## 2019-05-21 DIAGNOSIS — Z9884 Bariatric surgery status: Secondary | ICD-10-CM

## 2019-05-21 DIAGNOSIS — M19071 Primary osteoarthritis, right ankle and foot: Secondary | ICD-10-CM | POA: Diagnosis not present

## 2019-05-21 DIAGNOSIS — Z96641 Presence of right artificial hip joint: Secondary | ICD-10-CM

## 2019-05-21 DIAGNOSIS — M8589 Other specified disorders of bone density and structure, multiple sites: Secondary | ICD-10-CM | POA: Diagnosis not present

## 2019-06-06 DIAGNOSIS — H04123 Dry eye syndrome of bilateral lacrimal glands: Secondary | ICD-10-CM | POA: Diagnosis not present

## 2019-06-06 DIAGNOSIS — D899 Disorder involving the immune mechanism, unspecified: Secondary | ICD-10-CM | POA: Diagnosis not present

## 2019-06-06 DIAGNOSIS — H2513 Age-related nuclear cataract, bilateral: Secondary | ICD-10-CM | POA: Diagnosis not present

## 2019-06-06 DIAGNOSIS — Z79899 Other long term (current) drug therapy: Secondary | ICD-10-CM | POA: Diagnosis not present

## 2019-07-03 ENCOUNTER — Other Ambulatory Visit: Payer: Self-pay

## 2019-07-03 DIAGNOSIS — M359 Systemic involvement of connective tissue, unspecified: Secondary | ICD-10-CM

## 2019-07-03 DIAGNOSIS — Z79899 Other long term (current) drug therapy: Secondary | ICD-10-CM

## 2019-07-04 LAB — CBC WITH DIFFERENTIAL/PLATELET
Absolute Monocytes: 588 cells/uL (ref 200–950)
Basophils Absolute: 48 cells/uL (ref 0–200)
Basophils Relative: 0.8 %
Eosinophils Absolute: 258 cells/uL (ref 15–500)
Eosinophils Relative: 4.3 %
HCT: 37.7 % (ref 35.0–45.0)
Hemoglobin: 12.3 g/dL (ref 11.7–15.5)
Lymphs Abs: 1104 cells/uL (ref 850–3900)
MCH: 28.7 pg (ref 27.0–33.0)
MCHC: 32.6 g/dL (ref 32.0–36.0)
MCV: 87.9 fL (ref 80.0–100.0)
MPV: 10.1 fL (ref 7.5–12.5)
Monocytes Relative: 9.8 %
Neutro Abs: 4002 cells/uL (ref 1500–7800)
Neutrophils Relative %: 66.7 %
Platelets: 248 10*3/uL (ref 140–400)
RBC: 4.29 10*6/uL (ref 3.80–5.10)
RDW: 13.1 % (ref 11.0–15.0)
Total Lymphocyte: 18.4 %
WBC: 6 10*3/uL (ref 3.8–10.8)

## 2019-07-04 LAB — C3 AND C4
C3 Complement: 106 mg/dL (ref 83–193)
C4 Complement: 24 mg/dL (ref 15–57)

## 2019-07-04 LAB — URINALYSIS, ROUTINE W REFLEX MICROSCOPIC
Bilirubin Urine: NEGATIVE
Glucose, UA: NEGATIVE
Hgb urine dipstick: NEGATIVE
Ketones, ur: NEGATIVE
Leukocytes,Ua: NEGATIVE
Nitrite: NEGATIVE
Protein, ur: NEGATIVE
Specific Gravity, Urine: 1.014 (ref 1.001–1.03)
pH: 7 (ref 5.0–8.0)

## 2019-07-04 LAB — COMPLETE METABOLIC PANEL WITH GFR
AG Ratio: 1.7 (calc) (ref 1.0–2.5)
ALT: 17 U/L (ref 6–29)
AST: 26 U/L (ref 10–35)
Albumin: 4 g/dL (ref 3.6–5.1)
Alkaline phosphatase (APISO): 59 U/L (ref 37–153)
BUN: 13 mg/dL (ref 7–25)
CO2: 30 mmol/L (ref 20–32)
Calcium: 10.4 mg/dL (ref 8.6–10.4)
Chloride: 103 mmol/L (ref 98–110)
Creat: 0.73 mg/dL (ref 0.50–0.99)
GFR, Est African American: 98 mL/min/{1.73_m2} (ref >=60)
GFR, Est Non African American: 85 mL/min/{1.73_m2} (ref >=60)
Globulin: 2.3 g/dL (calc) (ref 1.9–3.7)
Glucose, Bld: 79 mg/dL (ref 65–99)
Potassium: 4.8 mmol/L (ref 3.5–5.3)
Sodium: 139 mmol/L (ref 135–146)
Total Bilirubin: 0.5 mg/dL (ref 0.2–1.2)
Total Protein: 6.3 g/dL (ref 6.1–8.1)

## 2019-07-04 LAB — SEDIMENTATION RATE: Sed Rate: 6 mm/h (ref 0–30)

## 2019-07-04 LAB — ANTI-DNA ANTIBODY, DOUBLE-STRANDED: ds DNA Ab: 1 IU/mL

## 2019-07-04 NOTE — Progress Notes (Signed)
CBC and CMP WNL.  ESR WNL.  UA normal.

## 2019-07-04 NOTE — Progress Notes (Signed)
Complements WNL.  DsDNA is negative.  Labs are not consistent with a flare at this time

## 2019-07-06 ENCOUNTER — Other Ambulatory Visit: Payer: Self-pay | Admitting: Rheumatology

## 2019-07-08 NOTE — Telephone Encounter (Signed)
Last Visit: 05/21/19 Next Visit: 09/10/19 Labs: 07/03/19 CBC and CMP WNL Eye exam: normal 06/19/2018   Patient advised she is due to update PLQ eye exam. Patient states she just had it done and will have them send over the results.   Okay to refill per Dr. Corliss Skains

## 2019-09-03 NOTE — Progress Notes (Deleted)
Office Visit Note  Patient: Chelsea Callahan             Date of Birth: Jun 01, 1950           MRN: [id number]             PCP: Bartholome Bill, MD Referring: Bartholome Bill, MD Visit Date: 09/10/2019 Occupation: @GUAROCC @  Subjective:  No chief complaint on file.   History of Present Illness: Chelsea Callahan is a 69 y.o. female ***   Activities of Daily Living:  Patient reports morning stiffness for *** {minute/hour:19697}.   Patient {ACTIONS;DENIES/REPORTS:21021675::"Denies"} nocturnal pain.  Difficulty dressing/grooming: {ACTIONS;DENIES/REPORTS:21021675::"Denies"} Difficulty climbing stairs: {ACTIONS;DENIES/REPORTS:21021675::"Denies"} Difficulty getting out of chair: {ACTIONS;DENIES/REPORTS:21021675::"Denies"} Difficulty using hands for taps, buttons, cutlery, and/or writing: {ACTIONS;DENIES/REPORTS:21021675::"Denies"}  No Rheumatology ROS completed.   PMFS History:  Patient Active Problem List   Diagnosis Date Noted  . Chronic pain of both knees 12/27/2017  . Pain in left hand 12/27/2017  . History of lumbar laminectomy for spinal cord decompression 12/27/2017  . Lumbar stenosis 05/13/2016  . High risk medication use 05/11/2016  . History of total hip replacement, right 05/11/2016  . Primary osteoarthritis of both feet 05/11/2016  . 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  . 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  . Low back pain 03/09/2016  . Spinal stenosis of lumbar region 03/09/2016  . Osteoarthritis of right knee 05/27/2014  . History of total knee replacement, bilateral 05/23/2014  . Autoimmune disease ([place]) 09/24/2013  . Osteoarthritis of left knee 09/13/2013    Class: Diagnosis of    Past Medical History:  Diagnosis Date  . Anemia   . 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HCC)   . Blood transfusion without reported diagnosis   . Cataract    bil/ per pt  . GERD (gastroesophageal reflux disease)   . Primary osteoarthritis of right knee   . RA (rheumatoid  arthritis) ([place])   . Raynaud disease   . Raynaud disease   . Spinal headache    c-section  . Vasculitis ([place])    on prednisone -     Family History  Problem Relation Age of Onset  . Cancer Brother   . Colon cancer Neg Hx   . Esophageal cancer Neg Hx   . Rectal cancer Neg Hx   . Stomach cancer Neg Hx    Past Surgical History:  Procedure Laterality Date  . abdominal clip     to stomach post gastric bypass  . CESAREAN SECTION     x 2  . GASTRIC BYPASS    . HERNIA REPAIR  2010   inverted hermia  . JOINT REPLACEMENT Right 2005   hip  . JOINT REPLACEMENT Bilateral    knee   . KNEE ARTHROPLASTY Left 09/13/2013   Procedure: LEFT COMPUTER ASSISTED TOTAL KNEE ARTHROPLASTY;  Surgeon: Marybelle Killings, MD;  Location: [facility];  Service: Orthopedics;  Laterality: Left;  Left Total Knee Arthroplasty, Computer Assist  . KNEE ARTHROPLASTY Right 05/23/2014   Procedure: COMPUTER ASSISTED TOTAL KNEE ARTHROPLASTY;  Surgeon: Marybelle Killings, MD;  Location: [facility];  Service: Orthopedics;  Laterality: Right;  . LUMBAR LAMINECTOMY/DECOMPRESSION MICRODISCECTOMY N/A 05/13/2016   Procedure: L4-5 Decompression;  Surgeon: Marybelle Killings, MD;  Location: [facility];  Service: Orthopedics;  Laterality: N/A;  . teeth etraction    . TOTAL KNEE ARTHROPLASTY Right 05/22/2014   dr Lorin Mercy  . TUBAL LIGATION  1981  . UPPER GI ENDOSCOPY     Social History   Social History  Narrative  . Not on file   Immunization History  Administered Date(s) Administered  . Zoster Recombinat (Shingrix) 03/08/2018, 05/08/2018     Objective: Vital Signs: There were no vitals taken for this visit.   Physical Exam   Musculoskeletal Exam: ***  CDAI Exam: CDAI Score: -- Patient Global: --; Provider Global: -- Swollen: --; Tender: -- Joint Exam 09/10/2019   No joint exam has been documented for this visit   There is currently no information documented on the homunculus. Go to the Rheumatology activity and complete the homunculus joint  exam.  Investigation: No additional findings.  Imaging: No results found.  Recent Labs: Lab Results  Component Value Date   WBC 6.0 07/03/2019   HGB 12.3 07/03/2019   PLT 248 07/03/2019   NA 139 07/03/2019   K 4.8 07/03/2019   CL 103 07/03/2019   CO2 30 07/03/2019   GLUCOSE 79 07/03/2019   BUN 13 07/03/2019   CREATININE 0.73 07/03/2019   BILITOT 0.5 07/03/2019   ALKPHOS 80 10/17/2016   AST 26 07/03/2019   ALT 17 07/03/2019   PROT 6.3 07/03/2019   ALBUMIN 4.2 10/17/2016   CALCIUM 10.4 07/03/2019   GFRAA 98 07/03/2019    Speciality Comments: PLQ eye exam:normal 2//4/21 [facility] . Follow up in 1 year.  Procedures:  No procedures performed Allergies: Ginger, Pork-derived products, and Shellfish allergy   Assessment / Plan:     Visit Diagnoses: No diagnosis found.  Orders: No orders of the defined types were placed in this encounter.  No orders of the defined types were placed in this encounter.   Face-to-face time spent with patient was *** minutes. Greater than 50% of time was spent in counseling and coordination of care.  Follow-Up Instructions: No follow-ups on file.   Gearldine Bienenstock, PA-C  Note - This record has been created using Dragon software.  Chart creation errors have been sought, but may not always  have been located. Such creation errors do not reflect on  the standard of medical care.

## 2019-09-10 ENCOUNTER — Ambulatory Visit: Payer: Medicare Other | Admitting: Rheumatology

## 2019-09-29 ENCOUNTER — Other Ambulatory Visit: Payer: Self-pay | Admitting: Rheumatology

## 2019-10-01 NOTE — Telephone Encounter (Signed)
LMOM for patient to call and schedule follow-up appointment.   °

## 2019-10-01 NOTE — Telephone Encounter (Signed)
Last Visit: 05/21/2019 Next Visit: due May 2021. Message sent to the front to schedule  Labs: 07/03/2019 CBC and CMP WNL.  Eye exam: 06/06/2019 WNL  Current Dose per office note 05/21/2019: Plaquenil 200 mg 1 tablet by mouth twice daily M-F.  Okay to refill per Dr. Corliss Skains

## 2019-10-01 NOTE — Telephone Encounter (Signed)
Please schedule patient a follow up visit. Patient was due May 2021. Thanks!  

## 2019-10-09 NOTE — Progress Notes (Deleted)
Office Visit Note  Patient: Chelsea Callahan             Date of Birth: May 10, 1950           MRN: [id number]             PCP: Verlon Au, MD Referring: Verlon Au, MD Visit Date: 10/22/2019 Occupation: @GUAROCC @  Subjective:  No chief complaint on file.   History of Present Illness: Wing Gfeller is a 69 y.o. female ***   Activities of Daily Living:  Patient reports morning stiffness for *** {minute/hour:19697}.   Patient {ACTIONS;DENIES/REPORTS:21021675::"Denies"} nocturnal pain.  Difficulty dressing/grooming: {ACTIONS;DENIES/REPORTS:21021675::"Denies"} Difficulty climbing stairs: {ACTIONS;DENIES/REPORTS:21021675::"Denies"} Difficulty getting out of chair: {ACTIONS;DENIES/REPORTS:21021675::"Denies"} Difficulty using hands for taps, buttons, cutlery, and/or writing: {ACTIONS;DENIES/REPORTS:21021675::"Denies"}  No Rheumatology ROS completed.   PMFS History:  Patient Active Problem List   Diagnosis Date Noted  . Chronic pain of both knees 12/27/2017  . Pain in left hand 12/27/2017  . History of lumbar laminectomy for spinal cord decompression 12/27/2017  . Lumbar stenosis 05/13/2016  . High risk medication use 05/11/2016  . History of total hip replacement, right 05/11/2016  . Primary osteoarthritis of both feet 05/11/2016  . 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  . 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  . Low back pain 03/09/2016  . Spinal stenosis of lumbar region 03/09/2016  . Osteoarthritis of right knee 05/27/2014  . History of total knee replacement, bilateral 05/23/2014  . Autoimmune disease (HCC) 09/24/2013  . Osteoarthritis of left knee 09/13/2013    Class: Diagnosis of    Past Medical History:  Diagnosis Date  . Anemia   . 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HCC)   . Blood transfusion without reported diagnosis   . Cataract    bil/ per pt  . GERD (gastroesophageal reflux disease)   . Primary osteoarthritis of right knee   . RA (rheumatoid  arthritis) (HCC)   . Raynaud disease   . Raynaud disease   . Spinal headache    c-section  . Vasculitis (HCC)    on prednisone -     Family History  Problem Relation Age of Onset  . Cancer Brother   . Colon cancer Neg Hx   . Esophageal cancer Neg Hx   . Rectal cancer Neg Hx   . Stomach cancer Neg Hx    Past Surgical History:  Procedure Laterality Date  . abdominal clip     to stomach post gastric bypass  . CESAREAN SECTION     x 2  . GASTRIC BYPASS    . HERNIA REPAIR  2010   inverted hermia  . JOINT REPLACEMENT Right 2005   hip  . JOINT REPLACEMENT Bilateral    knee   . KNEE ARTHROPLASTY Left 09/13/2013   Procedure: LEFT COMPUTER ASSISTED TOTAL KNEE ARTHROPLASTY;  Surgeon: 09/15/2013, MD;  Location: [facility] OR;  Service: Orthopedics;  Laterality: Left;  Left Total Knee Arthroplasty, Computer Assist  . KNEE ARTHROPLASTY Right 05/23/2014   Procedure: COMPUTER ASSISTED TOTAL KNEE ARTHROPLASTY;  Surgeon: 05/25/2014, MD;  Location: [facility] OR;  Service: Orthopedics;  Laterality: Right;  . LUMBAR LAMINECTOMY/DECOMPRESSION MICRODISCECTOMY N/A 05/13/2016   Procedure: L4-5 Decompression;  Surgeon: 07/11/2016, MD;  Location: [facility] OR;  Service: Orthopedics;  Laterality: N/A;  . teeth etraction    . TOTAL KNEE ARTHROPLASTY Right 05/22/2014   dr 05/24/2014  . TUBAL LIGATION  1981  . UPPER GI ENDOSCOPY     Social History   Social History  Narrative  . Not on file   Immunization History  Administered Date(s) Administered  . Zoster Recombinat (Shingrix) 03/08/2018, 05/08/2018     Objective: Vital Signs: There were no vitals taken for this visit.   Physical Exam   Musculoskeletal Exam: ***  CDAI Exam: CDAI Score: -- Patient Global: --; Provider Global: -- Swollen: --; Tender: -- Joint Exam 10/22/2019   No joint exam has been documented for this visit   There is currently no information documented on the homunculus. Go to the Rheumatology activity and complete the homunculus joint  exam.  Investigation: No additional findings.  Imaging: No results found.  Recent Labs: Lab Results  Component Value Date   WBC 6.0 07/03/2019   HGB 12.3 07/03/2019   PLT 248 07/03/2019   NA 139 07/03/2019   K 4.8 07/03/2019   CL 103 07/03/2019   CO2 30 07/03/2019   GLUCOSE 79 07/03/2019   BUN 13 07/03/2019   CREATININE 0.73 07/03/2019   BILITOT 0.5 07/03/2019   ALKPHOS 80 10/17/2016   AST 26 07/03/2019   ALT 17 07/03/2019   PROT 6.3 07/03/2019   ALBUMIN 4.2 10/17/2016   CALCIUM 10.4 07/03/2019   GFRAA 98 07/03/2019    Speciality Comments: PLQ eye exam:normal 2//4/21 [facility] . Follow up in 1 year.  Procedures:  No procedures performed Allergies: Ginger, Pork-derived products, and Shellfish allergy   Assessment / Plan:     Visit Diagnoses: No diagnosis found.  Orders: No orders of the defined types were placed in this encounter.  No orders of the defined types were placed in this encounter.   Face-to-face time spent with patient was *** minutes. Greater than 50% of time was spent in counseling and coordination of care.  Follow-Up Instructions: No follow-ups on file.   Earnestine Mealing, CMA  Note - This record has been created using Editor, commissioning.  Chart creation errors have been sought, but may not always  have been located. Such creation errors do not reflect on  the standard of medical care.

## 2019-10-22 ENCOUNTER — Ambulatory Visit: Payer: Medicare Other | Admitting: Rheumatology

## 2019-10-23 NOTE — Progress Notes (Signed)
Office Visit Note  Patient: Chelsea Callahan             Date of Birth: 1950-11-10           MRN: [id number]             PCP: Bartholome Bill, MD Referring: Bartholome Bill, MD Visit Date: 10/24/2019 Occupation: @GUAROCC @  Subjective:  Osteoarthritis (Aching)   History of Present Illness: Chelsea Callahan is a 69 y.o. female with history of autoimmune disease and osteoarthritis.  She states she is not having any renal symptoms currently.  She denies any history of oral ulcers, nasal ulcers, malar rash or photosensitivity.  She denies any joint swelling.  She states her right hip joint which has been replaced continues to give her some trouble.  She has had bilateral total knee replacements in the past which is also causing discomfort.  She is not having any lower back pain currently.  Activities of Daily Living:  Patient reports morning stiffness for 0 none.   Patient Denies nocturnal pain.  Difficulty dressing/grooming: Denies Difficulty climbing stairs: Denies Difficulty getting out of chair: Denies Difficulty using hands for taps, buttons, cutlery, and/or writing: Reports  Review of Systems  Constitutional: Negative for fatigue, night sweats, weight gain and weight loss.  HENT: Negative for mouth sores, trouble swallowing, trouble swallowing, mouth dryness and nose dryness.   Eyes: Negative for pain, redness, visual disturbance and dryness.  Respiratory: Negative for cough, shortness of breath and difficulty breathing.   Cardiovascular: Negative for chest pain, palpitations, hypertension, irregular heartbeat and swelling in legs/feet.  Gastrointestinal: Negative for blood in stool, constipation and diarrhea.  Endocrine: Negative for excessive thirst and increased urination.  Genitourinary: Negative for difficulty urinating and vaginal dryness.  Musculoskeletal: Positive for arthralgias and joint pain. Negative for joint swelling, myalgias, muscle weakness, morning stiffness,  muscle tenderness and myalgias.  Skin: Negative for color change, rash, hair loss, skin tightness, ulcers and sensitivity to sunlight.  Allergic/Immunologic: Negative for susceptible to infections.  Neurological: Negative for dizziness, numbness, memory loss, night sweats and weakness.  Hematological: Negative for bruising/bleeding tendency and swollen glands.  Psychiatric/Behavioral: Negative for depressed mood and sleep disturbance. The patient is not nervous/anxious.     PMFS History:  Patient Active Problem List   Diagnosis Date Noted  . Chronic pain of both knees 12/27/2017  . Pain in left hand 12/27/2017  . History of lumbar laminectomy for spinal cord decompression 12/27/2017  . Lumbar stenosis 05/13/2016  . High risk medication use 05/11/2016  . History of total hip replacement, right 05/11/2016  . Primary osteoarthritis of both feet 05/11/2016  . 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  . 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  . Low back pain 03/09/2016  . Spinal stenosis of lumbar region 03/09/2016  . Osteoarthritis of right knee 05/27/2014  . History of total knee replacement, bilateral 05/23/2014  . Autoimmune disease ([place]) 09/24/2013  . Osteoarthritis of left knee 09/13/2013    Class: Diagnosis of    Past Medical History:  Diagnosis Date  . Anemia   . 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HCC)   . Blood transfusion without reported diagnosis   . Cataract    bil/ per pt  . GERD (gastroesophageal reflux disease)   . Primary osteoarthritis of right knee   . Raynaud disease   . Raynaud disease   . Spinal headache    c-section  . Vasculitis ([place])    on prednisone -     Family  History  Problem Relation Age of Onset  . Cancer Brother   . Colon cancer Neg Hx   . Esophageal cancer Neg Hx   . Rectal cancer Neg Hx   . Stomach cancer Neg Hx    Past Surgical History:  Procedure Laterality Date  . abdominal clip     to stomach post gastric bypass  . CESAREAN SECTION     x  2  . GASTRIC BYPASS    . HERNIA REPAIR  2010   inverted hermia  . HIP ARTHROPLASTY    . JOINT REPLACEMENT Right 2005   hip  . JOINT REPLACEMENT Bilateral    knee   . KNEE ARTHROPLASTY Left 09/13/2013   Procedure: LEFT COMPUTER ASSISTED TOTAL KNEE ARTHROPLASTY;  Surgeon: Eldred Manges, MD;  Location: [facility] OR;  Service: Orthopedics;  Laterality: Left;  Left Total Knee Arthroplasty, Computer Assist  . KNEE ARTHROPLASTY Right 05/23/2014   Procedure: COMPUTER ASSISTED TOTAL KNEE ARTHROPLASTY;  Surgeon: Eldred Manges, MD;  Location: [facility] OR;  Service: Orthopedics;  Laterality: Right;  . LUMBAR LAMINECTOMY/DECOMPRESSION MICRODISCECTOMY N/A 05/13/2016   Procedure: L4-5 Decompression;  Surgeon: Eldred Manges, MD;  Location: [facility] OR;  Service: Orthopedics;  Laterality: N/A;  . teeth etraction    . TOTAL KNEE ARTHROPLASTY Right 05/22/2014   dr Ophelia Charter  . TUBAL LIGATION  1981  . UPPER GI ENDOSCOPY     Social History   Social History Narrative  . Not on file   Immunization History  Administered Date(s) Administered  . Zoster Recombinat (Shingrix) 03/08/2018, 05/08/2018     Objective: Vital Signs: BP 118/64 (BP Location: Left Arm, Patient Position: Sitting, Cuff Size: Normal)   Pulse 66   Resp 16   Ht 5\' 2"  (1.575 m)   Wt 175 lb 6.4 oz (79.6 kg)   BMI 32.08 kg/m    Physical Exam Vitals and nursing note reviewed.  Constitutional:      Appearance: She is well-developed.  HENT:     Head: Normocephalic and atraumatic.  Eyes:     Conjunctiva/sclera: Conjunctivae normal.  Cardiovascular:     Rate and Rhythm: Normal rate and regular rhythm.     Heart sounds: Normal heart sounds.  Pulmonary:     Effort: Pulmonary effort is normal.     Breath sounds: Normal breath sounds.  Abdominal:     General: Bowel sounds are normal.     Palpations: Abdomen is soft.  Musculoskeletal:     Cervical back: Normal range of motion.  Lymphadenopathy:     Cervical: No cervical adenopathy.  Skin:    General: Skin  is warm and dry.     Capillary Refill: Capillary refill takes less than 2 seconds.  Neurological:     Mental Status: She is alert and oriented to person, place, and time.  Psychiatric:        Behavior: Behavior normal.      Musculoskeletal Exam: C-spine thoracic and lumbar spine with good range of motion.  Shoulder joints, elbow joints, wrist joints with good range of motion.  She has some PIP and DIP thickening with no synovitis.  Her right total hip replacement had good range of motion with some discomfort.  Her bilateral knee joints are replaced and were in good range of motion without any warmth.  She is some osteoarthritic changes in her feet with no synovitis.  CDAI Exam: CDAI Score: -- Patient Global: --; Provider Global: -- Swollen: --; Tender: -- Joint Exam 10/24/2019  No joint exam has been documented for this visit   There is currently no information documented on the homunculus. Go to the Rheumatology activity and complete the homunculus joint exam.  Investigation: No additional findings.  Imaging: No results found.  Recent Labs: Lab Results  Component Value Date   WBC 6.0 07/03/2019   HGB 12.3 07/03/2019   PLT 248 07/03/2019   NA 139 07/03/2019   K 4.8 07/03/2019   CL 103 07/03/2019   CO2 30 07/03/2019   GLUCOSE 79 07/03/2019   BUN 13 07/03/2019   CREATININE 0.73 07/03/2019   BILITOT 0.5 07/03/2019   ALKPHOS 80 10/17/2016   AST 26 07/03/2019   ALT 17 07/03/2019   PROT 6.3 07/03/2019   ALBUMIN 4.2 10/17/2016   CALCIUM 10.4 07/03/2019   GFRAA 98 07/03/2019    Speciality Comments: PLQ eye exam:normal 06/06/19 [facility] . Follow up in 1 year.  Procedures:  No procedures performed Allergies: Ginger, Pork-derived products, and Shellfish allergy   Assessment / Plan:     Visit Diagnoses: Autoimmune disease (HCC) - Positive ANA 1:80 nucleolar, positive RF, negative CCP, anticardiolipin IgM 21, Raynauds, questionable history of vasculitis.   Patient had no synovitis on examination today.  Her Raynaud's is not active.  There is no sclerodactyly or telangiectasias.  High risk medication use - Plaquenil 200 mg 1 tablet twice daily Monday through Friday only.  PLQ eye exam:normal 06/06/19.  Her labs have been stable.  Have advised her to get labs in August.  We will check autoimmune labs in January.  History of total hip replacement, right-she is experiencing increased discomfort.  If she has persistent problems have advised her to schedule an appointment with the orthopedics.  History of total knee replacement, bilateral - Dr. Ophelia Charter.  Doing well.  Primary osteoarthritis of both feet-she currently does not have much discomfort.  DDD Lumbar spine with spinal stenosis-she had decompression /laminectomy by Dr. Ophelia Charter in 2018 and has done very well.  Osteopenia of multiple sites - DEXA on 07/22/16: The BMD measured at Femur Neck is 0.848 g/cm2 with a T-score of -1.4. Ordered by PCP.  She is due to update DEXA.    History of gastric bypass - GERD/abdominal  Orders: No orders of the defined types were placed in this encounter.  No orders of the defined types were placed in this encounter.    Follow-Up Instructions: Return in about 5 months (around 03/25/2020) for Autoimmune disease.   Chelsea Savoy, MD  Note - This record has been created using Animal nutritionist.  Chart creation errors have been sought, but may not always  have been located. Such creation errors do not reflect on  the standard of medical care.

## 2019-10-24 ENCOUNTER — Encounter: Payer: Self-pay | Admitting: Rheumatology

## 2019-10-24 ENCOUNTER — Other Ambulatory Visit: Payer: Self-pay

## 2019-10-24 ENCOUNTER — Ambulatory Visit (INDEPENDENT_AMBULATORY_CARE_PROVIDER_SITE_OTHER): Payer: Medicare Other | Admitting: Rheumatology

## 2019-10-24 VITALS — BP 118/64 | HR 66 | Resp 16 | Ht 62.0 in | Wt 175.4 lb

## 2019-10-24 DIAGNOSIS — Z9884 Bariatric surgery status: Secondary | ICD-10-CM | POA: Diagnosis not present

## 2019-10-24 DIAGNOSIS — M47816 Spondylosis without myelopathy or radiculopathy, lumbar region: Secondary | ICD-10-CM | POA: Diagnosis not present

## 2019-10-24 DIAGNOSIS — Z96653 Presence of artificial knee joint, bilateral: Secondary | ICD-10-CM | POA: Diagnosis not present

## 2019-10-24 DIAGNOSIS — M359 Systemic involvement of connective tissue, unspecified: Secondary | ICD-10-CM | POA: Diagnosis not present

## 2019-10-24 DIAGNOSIS — M19072 Primary osteoarthritis, left ankle and foot: Secondary | ICD-10-CM | POA: Diagnosis not present

## 2019-10-24 DIAGNOSIS — M19071 Primary osteoarthritis, right ankle and foot: Secondary | ICD-10-CM | POA: Diagnosis not present

## 2019-10-24 DIAGNOSIS — M8589 Other specified disorders of bone density and structure, multiple sites: Secondary | ICD-10-CM

## 2019-10-24 DIAGNOSIS — Z79899 Other long term (current) drug therapy: Secondary | ICD-10-CM

## 2019-10-24 DIAGNOSIS — Z96641 Presence of right artificial hip joint: Secondary | ICD-10-CM | POA: Diagnosis not present

## 2019-10-24 NOTE — Patient Instructions (Signed)
Standing Labs We placed an order today for your standing lab work.   Please have your standing labs drawn in August  If possible, please have your labs drawn 2 weeks prior to your appointment so that the provider can discuss your results at your appointment.  We have open lab daily Monday through Thursday from 8:30-12:30 PM and 1:30-4:30 PM and Friday from 8:30-12:30 PM and 1:30-4:00 PM at the office of Dr. Pollyann Savoy, [facility].   You may experience shorter wait times on Monday and Friday afternoons. The office is located at [address], [address], [place], [place] [address] No appointment is necessary.   Labs are drawn by [employer].  You may receive a bill from [facility] for your lab work.  If you wish to have your labs drawn at another location, please call the office 24 hours in advance to send orders.  If you have any questions regarding directions or hours of operation,  please call [phone].   As a reminder, please drink plenty of water prior to coming for your lab work. Thanks!

## 2019-10-29 ENCOUNTER — Other Ambulatory Visit: Payer: Self-pay | Admitting: Gastroenterology

## 2019-10-29 DIAGNOSIS — K219 Gastro-esophageal reflux disease without esophagitis: Secondary | ICD-10-CM

## 2019-12-24 ENCOUNTER — Telehealth: Payer: Self-pay | Admitting: Rheumatology

## 2019-12-24 DIAGNOSIS — Z79899 Other long term (current) drug therapy: Secondary | ICD-10-CM | POA: Diagnosis not present

## 2019-12-24 LAB — COMPLETE METABOLIC PANEL WITH GFR
AG Ratio: 1.8 (calc) (ref 1.0–2.5)
ALT: 18 U/L (ref 6–29)
AST: 25 U/L (ref 10–35)
Albumin: 4.3 g/dL (ref 3.6–5.1)
Alkaline phosphatase (APISO): 64 U/L (ref 37–153)
BUN: 12 mg/dL (ref 7–25)
CO2: 27 mmol/L (ref 20–32)
Calcium: 10.2 mg/dL (ref 8.6–10.4)
Chloride: 102 mmol/L (ref 98–110)
Creat: 0.73 mg/dL (ref 0.50–0.99)
GFR, Est African American: 98 mL/min/{1.73_m2} (ref >=60)
GFR, Est Non African American: 85 mL/min/{1.73_m2} (ref >=60)
Globulin: 2.4 g/dL (calc) (ref 1.9–3.7)
Glucose, Bld: 74 mg/dL (ref 65–99)
Potassium: 4.6 mmol/L (ref 3.5–5.3)
Sodium: 138 mmol/L (ref 135–146)
Total Bilirubin: 0.5 mg/dL (ref 0.2–1.2)
Total Protein: 6.7 g/dL (ref 6.1–8.1)

## 2019-12-24 LAB — CBC WITH DIFFERENTIAL/PLATELET
Absolute Monocytes: 496 cells/uL (ref 200–950)
Basophils Absolute: 63 cells/uL (ref 0–200)
Basophils Relative: 1.1 %
Eosinophils Absolute: 182 cells/uL (ref 15–500)
Eosinophils Relative: 3.2 %
HCT: 38.4 % (ref 35.0–45.0)
Hemoglobin: 12.5 g/dL (ref 11.7–15.5)
Lymphs Abs: 1351 cells/uL (ref 850–3900)
MCH: 29.1 pg (ref 27.0–33.0)
MCHC: 32.6 g/dL (ref 32.0–36.0)
MCV: 89.5 fL (ref 80.0–100.0)
MPV: 10.4 fL (ref 7.5–12.5)
Monocytes Relative: 8.7 %
Neutro Abs: 3608 cells/uL (ref 1500–7800)
Neutrophils Relative %: 63.3 %
Platelets: 257 10*3/uL (ref 140–400)
RBC: 4.29 10*6/uL (ref 3.80–5.10)
RDW: 13.1 % (ref 11.0–15.0)
Total Lymphocyte: 23.7 %
WBC: 5.7 10*3/uL (ref 3.8–10.8)

## 2019-12-24 NOTE — Telephone Encounter (Signed)
pharmacy changed in the computer.

## 2019-12-24 NOTE — Telephone Encounter (Signed)
Patient would like to make sure her pharmacy is [facility] on [address]. Please change if it is not.

## 2020-01-20 ENCOUNTER — Other Ambulatory Visit: Payer: Self-pay | Admitting: Rheumatology

## 2020-01-20 NOTE — Telephone Encounter (Signed)
Last Visit: 10/24/2019 Next Visit: 04/07/2020 Labs: 12/24/2019 CBC and CMP WNL Eye exam: 06/06/2019   Current Dose per office note 10/25/2019:  Plaquenil 200 mg 1 tablet twice daily Monday through Friday only PY:[id number] disease   Okay to refill per Dr. Corliss Skains

## 2020-03-24 NOTE — Progress Notes (Signed)
Office Visit Note  Patient: Chelsea Callahan             Date of Birth: [birth year]           MRN: [id number]             PCP: Bartholome Bill, MD Referring: Bartholome Bill, MD Visit Date: 04/07/2020 Occupation: @GUAROCC @  Subjective:  Medication management.   History of Present Illness: Magda Muise is a 69 y.o. female with history of autoimmune disease and osteoarthritis.  She has been on lower dose of Plaquenil at 200 mg p.o. daily for the last couple of years.  She has not noticed any flares.  She continues to have Raynaud's symptoms.  She states her right hip joint which is replaced has been doing well.  She continues to have some discomfort in her knee joints which have been replaced.  She denies any history of joint swelling.  She has an eye exam coming up in January 2022.  Activities of Daily Living:  Patient reports morning stiffness for 0 minutes.   Patient Denies nocturnal pain.  Difficulty dressing/grooming: Denies Difficulty climbing stairs: Denies Difficulty getting out of chair: Denies Difficulty using hands for taps, buttons, cutlery, and/or writing: Reports  Review of Systems  Constitutional: Negative for fatigue.  HENT: Negative for mouth sores, mouth dryness and nose dryness.   Eyes: Negative for pain, itching, visual disturbance and dryness.  Respiratory: Negative for cough, hemoptysis, shortness of breath and difficulty breathing.   Cardiovascular: Negative for chest pain, palpitations and swelling in legs/feet.  Gastrointestinal: Negative for abdominal pain, blood in stool, constipation and diarrhea.  Endocrine: Negative for increased urination.  Genitourinary: Negative for painful urination.  Musculoskeletal: Positive for arthralgias and joint pain. Negative for joint swelling, myalgias, muscle weakness, morning stiffness, muscle tenderness and myalgias.  Skin: Positive for color change. Negative for rash and redness.  Allergic/Immunologic: Negative for  susceptible to infections.  Neurological: Negative for dizziness, numbness, headaches, memory loss and weakness.  Hematological: Negative for swollen glands.  Psychiatric/Behavioral: Positive for sleep disturbance. Negative for confusion.    PMFS History:  Patient Active Problem List   Diagnosis Date Noted  . Chronic pain of both knees 12/27/2017  . Pain in left hand 12/27/2017  . History of lumbar laminectomy for spinal cord decompression 12/27/2017  . Lumbar stenosis 05/13/2016  . High risk medication use 05/11/2016  . History of total hip replacement, right 05/11/2016  . Primary osteoarthritis of both feet 05/11/2016  . 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  . 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  . Low back pain 03/09/2016  . Spinal stenosis of lumbar region 03/09/2016  . Osteoarthritis of right knee 05/27/2014  . History of total knee replacement, bilateral 05/23/2014  . Autoimmune disease ([place]) 09/24/2013  . Osteoarthritis of left knee 09/13/2013    Class: Diagnosis of    Past Medical History:  Diagnosis Date  . Anemia   . 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HCC)   . Blood transfusion without reported diagnosis   . Cataract    bil/ per pt  . GERD (gastroesophageal reflux disease)   . Primary osteoarthritis of right knee   . Raynaud disease   . Raynaud disease   . Spinal headache    c-section  . Vasculitis ([place])    on prednisone -     Family History  Problem Relation Age of Onset  . Cancer Brother   . Multiple myeloma Brother   . Colon cancer Neg Hx   .  Esophageal cancer Neg Hx   . Rectal cancer Neg Hx   . Stomach cancer Neg Hx    Past Surgical History:  Procedure Laterality Date  . abdominal clip     to stomach post gastric bypass  . CESAREAN SECTION     x 2  . GASTRIC BYPASS    . HERNIA REPAIR  2010   inverted hermia  . HIP ARTHROPLASTY    . JOINT REPLACEMENT Right 2005   hip  . JOINT REPLACEMENT Bilateral    knee   . KNEE ARTHROPLASTY Left 09/13/2013     Procedure: LEFT COMPUTER ASSISTED TOTAL KNEE ARTHROPLASTY;  Surgeon: Marybelle Killings, MD;  Location: [facility];  Service: Orthopedics;  Laterality: Left;  Left Total Knee Arthroplasty, Computer Assist  . KNEE ARTHROPLASTY Right 05/23/2014   Procedure: COMPUTER ASSISTED TOTAL KNEE ARTHROPLASTY;  Surgeon: Marybelle Killings, MD;  Location: [facility];  Service: Orthopedics;  Laterality: Right;  . LUMBAR LAMINECTOMY/DECOMPRESSION MICRODISCECTOMY N/A 05/13/2016   Procedure: L4-5 Decompression;  Surgeon: Marybelle Killings, MD;  Location: [facility];  Service: Orthopedics;  Laterality: N/A;  . teeth etraction    . TOTAL KNEE ARTHROPLASTY Right 05/22/2014   dr Lorin Mercy  . TUBAL LIGATION  1981  . UPPER GI ENDOSCOPY     Social History   Social History Narrative  . Not on file   Immunization History  Administered Date(s) Administered  . PFIZER SARS-COV-2 Vaccination 05/21/2019, 06/11/2019, 04/05/2020  . Zoster Recombinat (Shingrix) 03/08/2018, 05/08/2018     Objective: Vital Signs: BP 113/75 (BP Location: Right Arm, Patient Position: Sitting, Cuff Size: Small)   Pulse 68   Ht $R'5\' 2"'RN$  (1.575 m)   Wt 178 lb 12.8 oz (81.1 kg)   BMI 32.70 kg/m    Physical Exam Vitals and nursing note reviewed.  Constitutional:      Appearance: She is well-developed.  HENT:     Head: Normocephalic and atraumatic.  Eyes:     Conjunctiva/sclera: Conjunctivae normal.  Cardiovascular:     Rate and Rhythm: Normal rate and regular rhythm.     Heart sounds: Normal heart sounds.  Pulmonary:     Effort: Pulmonary effort is normal.     Breath sounds: Normal breath sounds.  Abdominal:     General: Bowel sounds are normal.     Palpations: Abdomen is soft.  Musculoskeletal:     Cervical back: Normal range of motion.  Lymphadenopathy:     Cervical: No cervical adenopathy.  Skin:    General: Skin is warm and dry.     Capillary Refill: Capillary refill takes less than 2 seconds.  Neurological:     Mental Status: She is alert and oriented to  person, place, and time.  Psychiatric:        Behavior: Behavior normal.      Musculoskeletal Exam: C-spine, thoracic and lumbar spine with good range of motion.  Shoulder joints, elbow joints, wrist joints with good range of motion.  She has subluxation of bilateral first MCP joints.  No synovitis was noted over wrist joint MCPs PIPs or DIPs.  Right hip joint is replaced and was in good range of motion.  Her bilateral knee joints are replaced without any warmth swelling or effusion.  She had no tenderness across MTPs.  CDAI Exam: CDAI Score: -- Patient Global: --; Provider Global: -- Swollen: --; Tender: -- Joint Exam 04/07/2020   No joint exam has been documented for this visit   There is currently no information  documented on the homunculus. Go to the Rheumatology activity and complete the homunculus joint exam.  Investigation: No additional findings.  Imaging: No results found.  Recent Labs: Lab Results  Component Value Date   WBC 5.7 12/24/2019   HGB 12.5 12/24/2019   PLT 257 12/24/2019   NA 138 12/24/2019   K 4.6 12/24/2019   CL 102 12/24/2019   CO2 27 12/24/2019   GLUCOSE 74 12/24/2019   BUN 12 12/24/2019   CREATININE 0.73 12/24/2019   BILITOT 0.5 12/24/2019   ALKPHOS 80 10/17/2016   AST 25 12/24/2019   ALT 18 12/24/2019   PROT 6.7 12/24/2019   ALBUMIN 4.2 10/17/2016   CALCIUM 10.2 12/24/2019   GFRAA 98 12/24/2019    Speciality Comments: PLQ eye exam:normal 06/06/19 [facility] . Follow up in 1 year.  Procedures:  No procedures performed Allergies: Ginger, Pork-derived products, and Shellfish allergy   Assessment / Plan:     Visit Diagnoses: Autoimmune disease ([place]) - Positive ANA 1:80 nucleolar, positive RF, negative CCP, anticardiolipin IgM 21, Raynauds, questionable history of vasculitis.  She has been doing well on lower dose of Plaquenil.  She has been on Plaquenil 200 mg p.o. daily for the last 2 years.  She denies any history of oral  ulcers, nasal ulcers, malar rash.  She continues to have some Raynaud's symptoms.  Keeping core temperature warm was discussed.  High risk medication use - Plaquenil 200 mg 1 tablet daily .  PLQ eye exam:normal 06/06/19.  She has an eye examination in January 2022.  She will come back for lab results at the time.  History of total hip replacement, right-doing well with good range of motion.  History of total knee replacement, bilateral - Dr. Lorin Mercy.  She continues to have discomfort in the bilateral knee joints.  Primary osteoarthritis of both feet-proper fitting shoes were discussed.  DDD Lumbar spine with spinal stenosis - she had decompression /laminectomy by Dr. Lorin Mercy in 2018.  Doing well.  Osteopenia of multiple sites - DEXA on 07/22/16: The BMD measured at Femur Neck is 0.848 g/cm2 with a T-score of -1.4. Ordered by PCP.  She will discuss repeat DEXA scan with Dr. Lynann Bologna.  History of gastric bypass - GERD/abdominal  Educated about COVID-19 virus infection-she is fully vaccinated against COVID-19.  She also received her booster.  Use of mask, social distancing and hand hygiene was discussed.  Orders: Orders Placed This Encounter  Procedures  . CBC with Differential/Platelet  . COMPLETE METABOLIC PANEL WITH GFR  . Urinalysis, Routine w reflex microscopic  . Anti-DNA antibody, double-stranded  . C3 and C4  . Sedimentation rate   Meds ordered this encounter  Medications  . hydroxychloroquine (PLAQUENIL) 200 MG tablet    Sig: Take 1 tablet (200 mg total) by mouth daily.    Dispense:  90 tablet    Refill:  0     Follow-Up Instructions: Return in about 5 months (around 09/05/2020) for Autoimmune disease.   Bo Merino, MD  Note - This record has been created using Editor, commissioning.  Chart creation errors have been sought, but may not always  have been located. Such creation errors do not reflect on  the standard of medical care.

## 2020-04-05 DIAGNOSIS — Z23 Encounter for immunization: Secondary | ICD-10-CM | POA: Diagnosis not present

## 2020-04-07 ENCOUNTER — Ambulatory Visit (INDEPENDENT_AMBULATORY_CARE_PROVIDER_SITE_OTHER): Payer: Medicare Other | Admitting: Rheumatology

## 2020-04-07 ENCOUNTER — Other Ambulatory Visit: Payer: Self-pay

## 2020-04-07 ENCOUNTER — Encounter: Payer: Self-pay | Admitting: Rheumatology

## 2020-04-07 VITALS — BP 113/75 | HR 68 | Ht 62.0 in | Wt 178.8 lb

## 2020-04-07 DIAGNOSIS — Z96653 Presence of artificial knee joint, bilateral: Secondary | ICD-10-CM | POA: Diagnosis not present

## 2020-04-07 DIAGNOSIS — M19072 Primary osteoarthritis, left ankle and foot: Secondary | ICD-10-CM | POA: Diagnosis not present

## 2020-04-07 DIAGNOSIS — M359 Systemic involvement of connective tissue, unspecified: Secondary | ICD-10-CM | POA: Diagnosis not present

## 2020-04-07 DIAGNOSIS — M19071 Primary osteoarthritis, right ankle and foot: Secondary | ICD-10-CM | POA: Diagnosis not present

## 2020-04-07 DIAGNOSIS — M8589 Other specified disorders of bone density and structure, multiple sites: Secondary | ICD-10-CM

## 2020-04-07 DIAGNOSIS — Z79899 Other long term (current) drug therapy: Secondary | ICD-10-CM | POA: Diagnosis not present

## 2020-04-07 DIAGNOSIS — Z7189 Other specified counseling: Secondary | ICD-10-CM | POA: Diagnosis not present

## 2020-04-07 DIAGNOSIS — M47816 Spondylosis without myelopathy or radiculopathy, lumbar region: Secondary | ICD-10-CM | POA: Diagnosis not present

## 2020-04-07 DIAGNOSIS — Z96641 Presence of right artificial hip joint: Secondary | ICD-10-CM | POA: Diagnosis not present

## 2020-04-07 DIAGNOSIS — Z9884 Bariatric surgery status: Secondary | ICD-10-CM | POA: Diagnosis not present

## 2020-04-07 MED ORDER — HYDROXYCHLOROQUINE SULFATE 200 MG PO TABS: 200.0000 mg | ORAL_TABLET | Freq: Every day | ORAL | 0 refills | Status: DC

## 2020-04-07 NOTE — Patient Instructions (Signed)
Standing Labs We placed an order today for your standing lab work.   Please have your standing labs drawn in January  If possible, please have your labs drawn 2 weeks prior to your appointment so that the provider can discuss your results at your appointment.  We have open lab daily Monday through Thursday from 8:30-12:30 PM and 1:30-4:30 PM and Friday from 8:30-12:30 PM and 1:30-4:00 PM at the office of Dr. Pollyann Savoy, [facility].   Please be advised, patients with office appointments requiring lab work will take precedents over walk-in lab work.  If possible, please come for your lab work on Monday and Friday afternoons, as you may experience shorter wait times. The office is located at [address], [place], [place] [address] No appointment is necessary.   Labs are drawn by [facility]. Please bring your co-pay at the time of your lab draw.  You may receive a bill from [facility] for your lab work.  If you wish to have your labs drawn at another location, please call the office 24 hours in advance to send orders.  If you have any questions regarding directions or hours of operation,  please call [phone].   As a reminder, please drink plenty of water prior to coming for your lab work. Thanks!    Please discuss repeat bone density scan with Dr. Leavy Cella.

## 2020-04-13 ENCOUNTER — Other Ambulatory Visit: Payer: Self-pay | Admitting: Rheumatology

## 2020-04-13 NOTE — Telephone Encounter (Addendum)
Last Visit: 04/07/2020 Next Visit: 09/08/2020 Labs: 12/24/2019 WNL Eye exam: 06/06/19  Current Dose per office note 04/07/2020: Plaquenil 200 mg 1 tablet daily  DX: Autoimmune disease   Okay to refill PLQ?

## 2020-04-23 ENCOUNTER — Other Ambulatory Visit: Payer: Self-pay | Admitting: Gastroenterology

## 2020-04-23 DIAGNOSIS — K219 Gastro-esophageal reflux disease without esophagitis: Secondary | ICD-10-CM

## 2020-05-02 DIAGNOSIS — J984 Other disorders of lung: Secondary | ICD-10-CM

## 2020-05-02 HISTORY — DX: Other disorders of lung: J98.4

## 2020-06-08 DIAGNOSIS — D899 Disorder involving the immune mechanism, unspecified: Secondary | ICD-10-CM | POA: Diagnosis not present

## 2020-06-08 DIAGNOSIS — Z79899 Other long term (current) drug therapy: Secondary | ICD-10-CM | POA: Diagnosis not present

## 2020-06-08 DIAGNOSIS — H04123 Dry eye syndrome of bilateral lacrimal glands: Secondary | ICD-10-CM | POA: Diagnosis not present

## 2020-06-08 DIAGNOSIS — H2513 Age-related nuclear cataract, bilateral: Secondary | ICD-10-CM | POA: Diagnosis not present

## 2020-06-16 ENCOUNTER — Other Ambulatory Visit: Payer: Self-pay | Admitting: *Deleted

## 2020-06-16 DIAGNOSIS — M359 Systemic involvement of connective tissue, unspecified: Secondary | ICD-10-CM | POA: Diagnosis not present

## 2020-06-16 DIAGNOSIS — Z79899 Other long term (current) drug therapy: Secondary | ICD-10-CM | POA: Diagnosis not present

## 2020-06-17 LAB — CBC WITH DIFFERENTIAL/PLATELET
Absolute Monocytes: 454 cells/uL (ref 200–950)
Basophils Absolute: 59 cells/uL (ref 0–200)
Basophils Relative: 1.1 %
Eosinophils Absolute: 173 cells/uL (ref 15–500)
Eosinophils Relative: 3.2 %
HCT: 39.4 % (ref 35.0–45.0)
Hemoglobin: 13 g/dL (ref 11.7–15.5)
Lymphs Abs: 1188 cells/uL (ref 850–3900)
MCH: 29 pg (ref 27.0–33.0)
MCHC: 33 g/dL (ref 32.0–36.0)
MCV: 87.9 fL (ref 80.0–100.0)
MPV: 10.6 fL (ref 7.5–12.5)
Monocytes Relative: 8.4 %
Neutro Abs: 3526 cells/uL (ref 1500–7800)
Neutrophils Relative %: 65.3 %
Platelets: 257 10*3/uL (ref 140–400)
RBC: 4.48 10*6/uL (ref 3.80–5.10)
RDW: 12.9 % (ref 11.0–15.0)
Total Lymphocyte: 22 %
WBC: 5.4 10*3/uL (ref 3.8–10.8)

## 2020-06-17 LAB — COMPLETE METABOLIC PANEL WITH GFR
AG Ratio: 2.1 (calc) (ref 1.0–2.5)
ALT: 21 U/L (ref 6–29)
AST: 32 U/L (ref 10–35)
Albumin: 4.6 g/dL (ref 3.6–5.1)
Alkaline phosphatase (APISO): 68 U/L (ref 37–153)
BUN/Creatinine Ratio: 8 (calc) (ref 6–22)
BUN: 6 mg/dL — ABNORMAL LOW (ref 7–25)
CO2: 30 mmol/L (ref 20–32)
Calcium: 10.5 mg/dL — ABNORMAL HIGH (ref 8.6–10.4)
Chloride: 101 mmol/L (ref 98–110)
Creat: 0.78 mg/dL (ref 0.50–0.99)
GFR, Est African American: 90 mL/min/{1.73_m2} (ref >=60)
GFR, Est Non African American: 78 mL/min/{1.73_m2} (ref >=60)
Globulin: 2.2 g/dL (calc) (ref 1.9–3.7)
Glucose, Bld: 71 mg/dL (ref 65–99)
Potassium: 4.9 mmol/L (ref 3.5–5.3)
Sodium: 138 mmol/L (ref 135–146)
Total Bilirubin: 0.5 mg/dL (ref 0.2–1.2)
Total Protein: 6.8 g/dL (ref 6.1–8.1)

## 2020-06-17 LAB — URINALYSIS, ROUTINE W REFLEX MICROSCOPIC
Bacteria, UA: NONE SEEN /HPF
Bilirubin Urine: NEGATIVE
Glucose, UA: NEGATIVE
Hgb urine dipstick: NEGATIVE
Hyaline Cast: NONE SEEN /LPF
Ketones, ur: NEGATIVE
Nitrite: NEGATIVE
Protein, ur: NEGATIVE
RBC / HPF: NONE SEEN /HPF (ref 0–2)
Specific Gravity, Urine: 1.006 (ref 1.001–1.03)
Squamous Epithelial / HPF: NONE SEEN /HPF (ref <=5)
pH: 7 (ref 5.0–8.0)

## 2020-06-17 LAB — C3 AND C4
C3 Complement: 113 mg/dL (ref 83–193)
C4 Complement: 27 mg/dL (ref 15–57)

## 2020-06-17 LAB — ANTI-DNA ANTIBODY, DOUBLE-STRANDED: ds DNA Ab: 1 IU/mL

## 2020-06-17 LAB — SEDIMENTATION RATE: Sed Rate: 6 mm/h (ref 0–30)

## 2020-06-19 NOTE — Progress Notes (Signed)
Sed rate is normal, complements are normal, dsDNA is negative, CBC and CMP normal, UA is negative

## 2020-06-22 ENCOUNTER — Telehealth: Payer: Self-pay

## 2020-06-22 NOTE — Telephone Encounter (Signed)
Patient called requesting prescription refill of Hydroxycholoroquine to be sent to [facility] at [address].  Patient is requesting the prescription be sent in early because she will be out of town when her prescription is due.  Patient states she is leaving on 07/01/20.    Patient states there is a blood drive and requesting a return call to let her know if it is okay for her to donate blood while taking this medication.

## 2020-06-22 NOTE — Telephone Encounter (Signed)
There is no contraindication to donate blood on Plaquenil.

## 2020-06-22 NOTE — Telephone Encounter (Signed)
Patient advised it is 3.5 weeks to early to refill her medication. Patient states she will be travelling to [place] and will not be back until June. Patient advised when she is due for a refill to contact the office with a pharmacy in [place] and we can send it there. Patient advised there is no contraindication to donate blood while on PLQ.

## 2020-07-06 ENCOUNTER — Other Ambulatory Visit: Payer: Self-pay

## 2020-07-06 MED ORDER — HYDROXYCHLOROQUINE SULFATE 200 MG PO TABS: 200.0000 mg | ORAL_TABLET | Freq: Every day | ORAL | 0 refills | Status: DC

## 2020-07-06 NOTE — Telephone Encounter (Signed)
Patient called requesting prescription refill of Hydroxychloroquine.  Patient states she only has 1 pill remaining.  Patient states she is in [place] and needs the refill to be [facility] [address] in [place], [place].  Phone #[phone]  Fax #[phone]

## 2020-07-06 NOTE — Telephone Encounter (Signed)
Last Visit: 04/07/2020 Next Visit: 09/08/2020 Labs: 06/16/2020 Sed rate is normal, complements are normal, dsDNA is negative, CBC and CMP normal, UA is negative Eye exam: 06/08/2020   Current Dose per office note on 04/07/2020: Plaquenil 200 mg 1 tablet daily  OI:NOMVEHMCNO disease  Last Fill: 04/13/2020   Okay to refill Plaquenil? Please send to the [facility] in [place] due to patient traveling.

## 2020-07-08 ENCOUNTER — Other Ambulatory Visit: Payer: Self-pay | Admitting: Physician Assistant

## 2020-07-20 DIAGNOSIS — M069 Rheumatoid arthritis, unspecified: Secondary | ICD-10-CM | POA: Diagnosis not present

## 2020-07-20 DIAGNOSIS — Z1231 Encounter for screening mammogram for malignant neoplasm of breast: Secondary | ICD-10-CM | POA: Diagnosis not present

## 2020-07-20 DIAGNOSIS — J069 Acute upper respiratory infection, unspecified: Secondary | ICD-10-CM | POA: Diagnosis not present

## 2020-07-20 DIAGNOSIS — Z0000 Encounter for general adult medical examination without abnormal findings: Secondary | ICD-10-CM | POA: Diagnosis not present

## 2020-08-25 NOTE — Progress Notes (Signed)
Office Visit Note  Patient: Chelsea Callahan             Date of Birth: 1951-03-18           MRN: [id number]             PCP: Bartholome Bill, MD Referring: Bartholome Bill, MD Visit Date: 09/08/2020 Occupation: @GUAROCC @  Subjective:  Medication monitoring  History of Present Illness: Chelsea Callahan is a 70 y.o. female with history of autoimmune disease and osteoarthritis.  Patient is currently taking Plaquenil 200 mg 1 tablet by mouth daily.  She continues to tolerate Plaquenil without any side effects.  She denies any signs or symptoms of a flare recently.  She has occasional arthralgias but no joint swelling.  She states that she has chronic pain in her right hip replacement and bilateral knee replacements but she tries to remain active.  She denies any recent rashes or photosensitivity.  She has not had any oral or nasal ulcerations and denies any sicca symptoms.  She experiences symptoms of Raynaud's intermittently with cooler weather temperatures but denies any digital ulcerations.  She has not had any shortness of breath, pleuritic chest pain, or palpitations recently.  She denies any increased fatigue, fevers, or swollen lymph nodes.  Activities of Daily Living:  Patient reports morning stiffness for 1-2 minutes.   Patient Denies nocturnal pain.  Difficulty dressing/grooming: Denies Difficulty climbing stairs: Denies Difficulty getting out of chair: Denies Difficulty using hands for taps, buttons, cutlery, and/or writing: Reports  Review of Systems  Constitutional: Negative for fatigue.  HENT: Negative for mouth sores, mouth dryness and nose dryness.   Eyes: Negative for pain, itching, visual disturbance and dryness.  Respiratory: Negative for cough, hemoptysis, shortness of breath and difficulty breathing.   Cardiovascular: Positive for swelling in legs/feet. Negative for chest pain and palpitations.  Gastrointestinal: Negative for abdominal pain, blood in stool,  constipation and diarrhea.  Endocrine: Negative for increased urination.  Genitourinary: Negative for painful urination.  Musculoskeletal: Positive for arthralgias, joint pain, joint swelling and morning stiffness. Negative for myalgias, muscle weakness, muscle tenderness and myalgias.  Skin: Negative for color change, rash and redness.  Allergic/Immunologic: Negative for susceptible to infections.  Neurological: Negative for dizziness, numbness, headaches, memory loss and weakness.  Hematological: Negative for swollen glands.  Psychiatric/Behavioral: Negative for confusion and sleep disturbance.    PMFS History:  Patient Active Problem List   Diagnosis Date Noted  . Chronic pain of both knees 12/27/2017  . Pain in left hand 12/27/2017  . History of lumbar laminectomy for spinal cord decompression 12/27/2017  . Lumbar stenosis 05/13/2016  . High risk medication use 05/11/2016  . History of total hip replacement, right 05/11/2016  . Primary osteoarthritis of both feet 05/11/2016  . 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  . 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  . Low back pain 03/09/2016  . Spinal stenosis of lumbar region 03/09/2016  . Osteoarthritis of right knee 05/27/2014  . History of total knee replacement, bilateral 05/23/2014  . Autoimmune disease ([place]) 09/24/2013  . Osteoarthritis of left knee 09/13/2013    Class: Diagnosis of    Past Medical History:  Diagnosis Date  . Anemia   . 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HCC)   . Blood transfusion without reported diagnosis   . Cataract    bil/ per pt  . GERD (gastroesophageal reflux disease)   . Primary osteoarthritis of right knee   . Raynaud disease   . Raynaud disease   .  Spinal headache    c-section  . Vasculitis ([place])    on prednisone -     Family History  Problem Relation Age of Onset  . Cancer Brother   . Multiple myeloma Brother   . Colon cancer Neg Hx   . Esophageal cancer Neg Hx   . Rectal cancer Neg Hx   .  Stomach cancer Neg Hx    Past Surgical History:  Procedure Laterality Date  . abdominal clip     to stomach post gastric bypass  . CESAREAN SECTION     x 2  . GASTRIC BYPASS    . HERNIA REPAIR  2010   inverted hermia  . HIP ARTHROPLASTY    . JOINT REPLACEMENT Right 2005   hip  . JOINT REPLACEMENT Bilateral    knee   . KNEE ARTHROPLASTY Left 09/13/2013   Procedure: LEFT COMPUTER ASSISTED TOTAL KNEE ARTHROPLASTY;  Surgeon: Marybelle Killings, MD;  Location: [facility];  Service: Orthopedics;  Laterality: Left;  Left Total Knee Arthroplasty, Computer Assist  . KNEE ARTHROPLASTY Right 05/23/2014   Procedure: COMPUTER ASSISTED TOTAL KNEE ARTHROPLASTY;  Surgeon: Marybelle Killings, MD;  Location: [facility];  Service: Orthopedics;  Laterality: Right;  . LUMBAR LAMINECTOMY/DECOMPRESSION MICRODISCECTOMY N/A 05/13/2016   Procedure: L4-5 Decompression;  Surgeon: Marybelle Killings, MD;  Location: [facility];  Service: Orthopedics;  Laterality: N/A;  . teeth etraction    . TOTAL KNEE ARTHROPLASTY Right 05/22/2014   dr Lorin Mercy  . TUBAL LIGATION  1981  . UPPER GI ENDOSCOPY     Social History   Social History Narrative  . Not on file   Immunization History  Administered Date(s) Administered  . PFIZER([place])SARS-COV-2 Vaccination 05/21/2019, 06/11/2019, 04/05/2020  . Zoster Recombinat (Shingrix) 03/08/2018, 05/08/2018     Objective: Vital Signs: BP (!) 115/54 (BP Location: Left Arm, Patient Position: Sitting, Cuff Size: Normal)   Pulse 66   Ht $R'5\' 2"'kQ$  (1.575 m)   Wt 176 lb 12.8 oz (80.2 kg)   BMI 32.34 kg/m    Physical Exam Vitals and nursing note reviewed.  Constitutional:      Appearance: She is well-developed.  HENT:     Head: Normocephalic and atraumatic.  Eyes:     Conjunctiva/sclera: Conjunctivae normal.  Pulmonary:     Effort: Pulmonary effort is normal.  Abdominal:     Palpations: Abdomen is soft.  Musculoskeletal:     Cervical back: Normal range of motion.  Skin:    General: Skin is warm and  dry.     Capillary Refill: Capillary refill takes less than 2 seconds.  Neurological:     Mental Status: She is alert and oriented to person, place, and time.  Psychiatric:        Behavior: Behavior normal.      Musculoskeletal Exam: C-spine, thoracic spine, lumbar spine have good range of motion with no discomfort.  No midline spinal tenderness.  No SI joint tenderness.  Shoulder joints, elbow joints, wrist joints, MCPs, PIPs, DIPs have good range of motion with no synovitis.  She was able to make a complete fist bilaterally.  Right hip replacement has slightly limited range of motion.  Left hip has good range of motion with no discomfort.  Bilateral knee replacements have good range of motion with no warmth or effusion.  Ankle joints have good range of motion with some swelling in the right ankle.  CDAI Exam: CDAI Score: -- Patient Global: --; Provider Global: -- Swollen: --; Tender: --  Joint Exam 09/08/2020   No joint exam has been documented for this visit   There is currently no information documented on the homunculus. Go to the Rheumatology activity and complete the homunculus joint exam.  Investigation: No additional findings.  Imaging: No results found.  Recent Labs: Lab Results  Component Value Date   WBC 5.4 06/16/2020   HGB 13.0 06/16/2020   PLT 257 06/16/2020   NA 138 06/16/2020   K 4.9 06/16/2020   CL 101 06/16/2020   CO2 30 06/16/2020   GLUCOSE 71 06/16/2020   BUN 6 (L) 06/16/2020   CREATININE 0.78 06/16/2020   BILITOT 0.5 06/16/2020   ALKPHOS 80 10/17/2016   AST 32 06/16/2020   ALT 21 06/16/2020   PROT 6.8 06/16/2020   ALBUMIN 4.2 10/17/2016   CALCIUM 10.5 (H) 06/16/2020   GFRAA 90 06/16/2020    Speciality Comments: PLQ eye exam:normal 06/08/2020 [facility] . Follow up in 1 year.  Procedures:  No procedures performed Allergies: Ginger, Pork-derived products, and Shellfish allergy   Assessment / Plan:     Visit Diagnoses: Autoimmune  disease ([place]) - Positive ANA 1:80 nucleolar, positive RF, negative CCP, anticardiolipin IgM 21, Raynauds, questionable history of vasculitis: She has not had any signs or symptoms of an autoimmune disease flare.  She is clinically doing well taking Plaquenil 200 mg 1 tablet by mouth daily.  She continues to tolerate Plaquenil and has not missed any doses recently.  She has not had any recent rashes or photosensitivity.  We discussed the importance of wearing sunscreen SPF greater than 50 on a daily basis and avoiding direct sun exposure.  She has intermittent symptoms of Raynaud's with cooler weather temperature.  No digital ulcerations or signs of sclerodactyly were noted.  She has not had any oral or nasal ulcerations and denies any sicca symptoms.  She has not had any increased fatigue, fevers, or swollen lymph nodes recently.  No pleuritic chest pain, shortness of breath, or palpitations.  She continues to have occasional arthralgias and joint stiffness but no synovitis was noted on examination today.  Lab work from 06/16/2020 was reviewed today in the office: Double-stranded DNA negative, complements within normal limits, and ESR within normal limits.  Her next lab work will be due in July.  Future orders were placed today.  She will remain on Plaquenil as prescribed.  She was advised to notify us if she develops signs or symptoms of a flare.  She will follow-up in the office in 5 months.  High risk medication use - Plaquenil 200 mg 1 tablet by mouth daily. PLQ eye exam:normal 06/08/2020 [facility]. Follow up in 1 year.  CBC and CMP were drawn on 06/16/2020.  Her next lab work will be due in July.  Future orders will be placed today.  History of total hip replacement, right: She has slightly limited ROM.  History of total knee replacement, bilateral - Dr. Lorin Mercy.  Doing well.  She continues to experience occasional stiffness and pain in bilateral knee replacements she has good ROM with no  discomfort.  No warmth or effusion of knee joints.   Primary osteoarthritis of both feet: She is not experiencing any discomfort in her feet at this time.   DDD Lumbar spine with spinal stenosis - she had decompression /laminectomy by Dr. Lorin Mercy in 2018.  Osteopenia of multiple sites - DEXA on 07/22/16: The BMD measured at Femur Neck is 0.848 g/cm2 with a T-score of -1.4. Ordered by  PCP.   History of gastric bypass - GERD/abdominal  Orders: Orders Placed This Encounter  Procedures  . CBC with Differential/Platelet  . COMPLETE METABOLIC PANEL WITH GFR  . Urinalysis, Routine w reflex microscopic  . Anti-DNA antibody, double-stranded  . C3 and C4  . Sedimentation rate   No orders of the defined types were placed in this encounter.    Follow-Up Instructions: Return in about 5 months (around 02/08/2021) for Autoimmune Disease.   Ofilia Neas, PA-C  Note - This record has been created using Dragon software.  Chart creation errors have been sought, but may not always  have been located. Such creation errors do not reflect on  the standard of medical care.

## 2020-09-08 ENCOUNTER — Ambulatory Visit (INDEPENDENT_AMBULATORY_CARE_PROVIDER_SITE_OTHER): Payer: Medicare Other | Admitting: Physician Assistant

## 2020-09-08 ENCOUNTER — Encounter: Payer: Self-pay | Admitting: Physician Assistant

## 2020-09-08 ENCOUNTER — Other Ambulatory Visit: Payer: Self-pay

## 2020-09-08 VITALS — BP 115/54 | HR 66 | Ht 62.0 in | Wt 176.8 lb

## 2020-09-08 DIAGNOSIS — M19072 Primary osteoarthritis, left ankle and foot: Secondary | ICD-10-CM

## 2020-09-08 DIAGNOSIS — M359 Systemic involvement of connective tissue, unspecified: Secondary | ICD-10-CM | POA: Diagnosis not present

## 2020-09-08 DIAGNOSIS — M19071 Primary osteoarthritis, right ankle and foot: Secondary | ICD-10-CM | POA: Diagnosis not present

## 2020-09-08 DIAGNOSIS — M8589 Other specified disorders of bone density and structure, multiple sites: Secondary | ICD-10-CM

## 2020-09-08 DIAGNOSIS — M47816 Spondylosis without myelopathy or radiculopathy, lumbar region: Secondary | ICD-10-CM | POA: Diagnosis not present

## 2020-09-08 DIAGNOSIS — Z9884 Bariatric surgery status: Secondary | ICD-10-CM

## 2020-09-08 DIAGNOSIS — Z96653 Presence of artificial knee joint, bilateral: Secondary | ICD-10-CM

## 2020-09-08 DIAGNOSIS — Z96641 Presence of right artificial hip joint: Secondary | ICD-10-CM

## 2020-09-08 DIAGNOSIS — Z79899 Other long term (current) drug therapy: Secondary | ICD-10-CM | POA: Diagnosis not present

## 2020-09-30 ENCOUNTER — Other Ambulatory Visit: Payer: Self-pay | Admitting: Family Medicine

## 2020-09-30 ENCOUNTER — Telehealth: Payer: Self-pay

## 2020-09-30 DIAGNOSIS — Z1231 Encounter for screening mammogram for malignant neoplasm of breast: Secondary | ICD-10-CM

## 2020-09-30 MED ORDER — HYDROXYCHLOROQUINE SULFATE 200 MG PO TABS: 200.0000 mg | ORAL_TABLET | Freq: Every day | ORAL | 0 refills | Status: DC

## 2020-09-30 NOTE — Telephone Encounter (Signed)
Last Visit: 09/08/2020 Next Visit: 02/09/2021 Labs: 06/16/2020 CBC and CMP normal Eye exam: :normal 06/08/2020   Current Dose per office note 09/08/2020: Plaquenil 200 mg 1 tablet by mouth daily SV:[id number] disease  Last Fill: 07/06/2020  Okay to refill per Dr. Corliss Skains

## 2020-09-30 NOTE — Telephone Encounter (Signed)
Patient called requesting prescription refill of Hydroxychloroquine to be sent to [facility] at [address] [facility].

## 2020-10-01 ENCOUNTER — Other Ambulatory Visit: Payer: Self-pay

## 2020-10-01 ENCOUNTER — Ambulatory Visit
Admission: RE | Admit: 2020-10-01 | Discharge: 2020-10-01 | Disposition: A | Discharge status: Home / Self Care | Payer: Medicare Other | Source: Ambulatory Visit | Attending: Family Medicine | Admitting: Family Medicine

## 2020-10-01 ENCOUNTER — Other Ambulatory Visit: Payer: Self-pay | Admitting: Physician Assistant

## 2020-10-01 DIAGNOSIS — Z1231 Encounter for screening mammogram for malignant neoplasm of breast: Secondary | ICD-10-CM

## 2020-10-06 ENCOUNTER — Other Ambulatory Visit: Payer: Self-pay | Admitting: Family Medicine

## 2020-10-06 DIAGNOSIS — R928 Other abnormal and inconclusive findings on diagnostic imaging of breast: Secondary | ICD-10-CM

## 2020-10-13 DIAGNOSIS — Z23 Encounter for immunization: Secondary | ICD-10-CM | POA: Diagnosis not present

## 2020-10-29 ENCOUNTER — Other Ambulatory Visit: Payer: Medicare Other

## 2020-10-30 ENCOUNTER — Ambulatory Visit
Admission: RE | Admit: 2020-10-30 | Discharge: 2020-10-30 | Disposition: A | Discharge status: Home / Self Care | Payer: Medicare Other | Source: Ambulatory Visit | Attending: Family Medicine | Admitting: Family Medicine

## 2020-10-30 ENCOUNTER — Other Ambulatory Visit: Payer: Self-pay

## 2020-10-30 ENCOUNTER — Other Ambulatory Visit: Payer: Self-pay | Admitting: Family Medicine

## 2020-10-30 DIAGNOSIS — R922 Inconclusive mammogram: Secondary | ICD-10-CM | POA: Diagnosis not present

## 2020-10-30 DIAGNOSIS — R928 Other abnormal and inconclusive findings on diagnostic imaging of breast: Secondary | ICD-10-CM

## 2020-10-30 DIAGNOSIS — R590 Localized enlarged lymph nodes: Secondary | ICD-10-CM | POA: Diagnosis not present

## 2020-11-09 ENCOUNTER — Other Ambulatory Visit: Payer: Self-pay

## 2020-11-09 ENCOUNTER — Other Ambulatory Visit (HOSPITAL_COMMUNITY)
Admission: RE | Admit: 2020-11-09 | Discharge: 2020-11-09 | Disposition: A | Discharge status: Home / Self Care | Payer: Medicare Other | Source: Ambulatory Visit | Attending: Diagnostic Radiology | Admitting: Diagnostic Radiology

## 2020-11-09 ENCOUNTER — Ambulatory Visit
Admission: RE | Admit: 2020-11-09 | Discharge: 2020-11-09 | Disposition: A | Discharge status: Home / Self Care | Payer: Medicare Other | Source: Ambulatory Visit | Attending: Family Medicine | Admitting: Family Medicine

## 2020-11-09 DIAGNOSIS — R928 Other abnormal and inconclusive findings on diagnostic imaging of breast: Secondary | ICD-10-CM | POA: Diagnosis not present

## 2020-11-09 DIAGNOSIS — Z8616 Personal history of COVID-19: Secondary | ICD-10-CM | POA: Diagnosis not present

## 2020-11-09 DIAGNOSIS — I899 Noninfective disorder of lymphatic vessels and lymph nodes, unspecified: Secondary | ICD-10-CM | POA: Diagnosis not present

## 2020-11-09 DIAGNOSIS — R590 Localized enlarged lymph nodes: Secondary | ICD-10-CM | POA: Diagnosis not present

## 2020-11-11 LAB — SURGICAL PATHOLOGY

## 2020-11-13 ENCOUNTER — Other Ambulatory Visit: Payer: Self-pay | Admitting: *Deleted

## 2020-11-13 DIAGNOSIS — Z79899 Other long term (current) drug therapy: Secondary | ICD-10-CM | POA: Diagnosis not present

## 2020-11-13 DIAGNOSIS — M359 Systemic involvement of connective tissue, unspecified: Secondary | ICD-10-CM

## 2020-11-16 LAB — C3 AND C4
C3 Complement: 122 mg/dL (ref 83–193)
C4 Complement: 27 mg/dL (ref 15–57)

## 2020-11-16 LAB — CBC WITH DIFFERENTIAL/PLATELET
Absolute Monocytes: 456 cells/uL (ref 200–950)
Basophils Absolute: 69 cells/uL (ref 0–200)
Basophils Relative: 1.3 %
Eosinophils Absolute: 201 cells/uL (ref 15–500)
Eosinophils Relative: 3.8 %
HCT: 38.3 % (ref 35.0–45.0)
Hemoglobin: 12.3 g/dL (ref 11.7–15.5)
Lymphs Abs: 1235 cells/uL (ref 850–3900)
MCH: 28.5 pg (ref 27.0–33.0)
MCHC: 32.1 g/dL (ref 32.0–36.0)
MCV: 88.7 fL (ref 80.0–100.0)
MPV: 10.5 fL (ref 7.5–12.5)
Monocytes Relative: 8.6 %
Neutro Abs: 3339 cells/uL (ref 1500–7800)
Neutrophils Relative %: 63 %
Platelets: 276 10*3/uL (ref 140–400)
RBC: 4.32 10*6/uL (ref 3.80–5.10)
RDW: 12.4 % (ref 11.0–15.0)
Total Lymphocyte: 23.3 %
WBC: 5.3 10*3/uL (ref 3.8–10.8)

## 2020-11-16 LAB — SEDIMENTATION RATE: Sed Rate: 9 mm/h (ref 0–30)

## 2020-11-16 LAB — COMPLETE METABOLIC PANEL WITH GFR
AG Ratio: 1.6 (calc) (ref 1.0–2.5)
ALT: 21 U/L (ref 6–29)
AST: 32 U/L (ref 10–35)
Albumin: 4.3 g/dL (ref 3.6–5.1)
Alkaline phosphatase (APISO): 75 U/L (ref 37–153)
BUN: 12 mg/dL (ref 7–25)
CO2: 30 mmol/L (ref 20–32)
Calcium: 10.4 mg/dL (ref 8.6–10.4)
Chloride: 101 mmol/L (ref 98–110)
Creat: 0.77 mg/dL (ref 0.50–1.05)
Globulin: 2.7 g/dL (calc) (ref 1.9–3.7)
Glucose, Bld: 78 mg/dL (ref 65–99)
Potassium: 4.9 mmol/L (ref 3.5–5.3)
Sodium: 138 mmol/L (ref 135–146)
Total Bilirubin: 0.4 mg/dL (ref 0.2–1.2)
Total Protein: 7 g/dL (ref 6.1–8.1)
eGFR: 83 mL/min/{1.73_m2} (ref >=60)

## 2020-11-16 LAB — ANTI-DNA ANTIBODY, DOUBLE-STRANDED: ds DNA Ab: 1 IU/mL

## 2020-11-16 NOTE — Progress Notes (Signed)
dsDNA is negative.  Labs are not consistent with a flare.

## 2020-11-16 NOTE — Progress Notes (Signed)
CBC and CMP WNL.  ESR and complements WNL.

## 2020-12-10 DIAGNOSIS — Z1211 Encounter for screening for malignant neoplasm of colon: Secondary | ICD-10-CM | POA: Diagnosis not present

## 2020-12-16 ENCOUNTER — Ambulatory Visit: Payer: Self-pay | Admitting: Surgery

## 2020-12-16 DIAGNOSIS — R590 Localized enlarged lymph nodes: Secondary | ICD-10-CM | POA: Diagnosis not present

## 2021-01-02 ENCOUNTER — Other Ambulatory Visit: Payer: Self-pay | Admitting: Rheumatology

## 2021-01-05 NOTE — Telephone Encounter (Signed)
Patient left a message requesting a refill on generic Plaquenil sent to pharmacy. Patient going out of town this pm.

## 2021-01-05 NOTE — Telephone Encounter (Signed)
Next Visit: 02/09/2021  Last Visit: 09/08/2020   Labs: 11/13/2020 CBC and CMP WNL  Eye exam: normal 06/08/2020   Current Dose per office note 09/08/2020: Plaquenil 200 mg 1 tablet by mouth daily  RF:[id number] disease  Last Fill: 09/30/2020   Okay to refill Plaquenil?

## 2021-01-21 ENCOUNTER — Other Ambulatory Visit: Payer: Self-pay

## 2021-01-21 ENCOUNTER — Encounter (HOSPITAL_BASED_OUTPATIENT_CLINIC_OR_DEPARTMENT_OTHER): Payer: Self-pay | Admitting: Surgery

## 2021-01-26 ENCOUNTER — Encounter (HOSPITAL_BASED_OUTPATIENT_CLINIC_OR_DEPARTMENT_OTHER)
Admission: RE | Admit: 2021-01-26 | Discharge: 2021-01-26 | Disposition: A | Discharge status: Home / Self Care | Payer: Medicare Other | Source: Ambulatory Visit | Attending: Surgery | Admitting: Surgery

## 2021-01-26 DIAGNOSIS — R591 Generalized enlarged lymph nodes: Secondary | ICD-10-CM | POA: Diagnosis not present

## 2021-01-26 DIAGNOSIS — R590 Localized enlarged lymph nodes: Secondary | ICD-10-CM | POA: Diagnosis not present

## 2021-01-26 DIAGNOSIS — Z91018 Allergy to other foods: Secondary | ICD-10-CM | POA: Diagnosis not present

## 2021-01-26 DIAGNOSIS — Z91014 Allergy to mammalian meats: Secondary | ICD-10-CM | POA: Diagnosis not present

## 2021-01-26 DIAGNOSIS — Z6833 Body mass index (BMI) 33.0-33.9, adult: Secondary | ICD-10-CM | POA: Diagnosis not present

## 2021-01-26 DIAGNOSIS — Z87891 Personal history of nicotine dependence: Secondary | ICD-10-CM | POA: Diagnosis not present

## 2021-01-26 DIAGNOSIS — Z9884 Bariatric surgery status: Secondary | ICD-10-CM | POA: Diagnosis not present

## 2021-01-26 DIAGNOSIS — Z91013 Allergy to seafood: Secondary | ICD-10-CM | POA: Diagnosis not present

## 2021-01-26 DIAGNOSIS — Z01818 Encounter for other preprocedural examination: Secondary | ICD-10-CM | POA: Insufficient documentation

## 2021-01-26 DIAGNOSIS — Z808 Family history of malignant neoplasm of other organs or systems: Secondary | ICD-10-CM | POA: Diagnosis not present

## 2021-01-26 DIAGNOSIS — Z79899 Other long term (current) drug therapy: Secondary | ICD-10-CM | POA: Diagnosis not present

## 2021-01-26 DIAGNOSIS — E669 Obesity, unspecified: Secondary | ICD-10-CM | POA: Diagnosis not present

## 2021-01-26 DIAGNOSIS — Z8349 Family history of other endocrine, nutritional and metabolic diseases: Secondary | ICD-10-CM | POA: Diagnosis not present

## 2021-01-26 LAB — CBC WITH DIFFERENTIAL/PLATELET
Abs Immature Granulocytes: 0.02 10*3/uL (ref 0.00–0.07)
Basophils Absolute: 0.1 10*3/uL (ref 0.0–0.1)
Basophils Relative: 1 %
Eosinophils Absolute: 0.2 10*3/uL (ref 0.0–0.5)
Eosinophils Relative: 4 %
HCT: 39.8 % (ref 36.0–46.0)
Hemoglobin: 12.5 g/dL (ref 12.0–15.0)
Immature Granulocytes: 0 %
Lymphocytes Relative: 25 %
Lymphs Abs: 1.5 10*3/uL (ref 0.7–4.0)
MCH: 28.6 pg (ref 26.0–34.0)
MCHC: 31.4 g/dL (ref 30.0–36.0)
MCV: 91.1 fL (ref 80.0–100.0)
Monocytes Absolute: 0.5 10*3/uL (ref 0.1–1.0)
Monocytes Relative: 8 %
Neutro Abs: 3.7 10*3/uL (ref 1.7–7.7)
Neutrophils Relative %: 62 %
Platelets: 258 10*3/uL (ref 150–400)
RBC: 4.37 MIL/uL (ref 3.87–5.11)
RDW: 13.8 % (ref 11.5–15.5)
WBC: 5.9 10*3/uL (ref 4.0–10.5)
nRBC: 0 % (ref 0.0–0.2)

## 2021-01-26 LAB — COMPREHENSIVE METABOLIC PANEL
ALT: 20 U/L (ref 0–44)
AST: 31 U/L (ref 15–41)
Albumin: 3.8 g/dL (ref 3.5–5.0)
Alkaline Phosphatase: 62 U/L (ref 38–126)
Anion gap: 7 (ref 5–15)
BUN: 11 mg/dL (ref 8–23)
CO2: 28 mmol/L (ref 22–32)
Calcium: 10.2 mg/dL (ref 8.9–10.3)
Chloride: 103 mmol/L (ref 98–111)
Creatinine, Ser: 0.78 mg/dL (ref 0.44–1.00)
GFR, Estimated: >60 mL/min (ref >60)
Glucose, Bld: 70 mg/dL (ref 70–99)
Potassium: 4.6 mmol/L (ref 3.5–5.1)
Sodium: 138 mmol/L (ref 135–145)
Total Bilirubin: 0.6 mg/dL (ref 0.3–1.2)
Total Protein: 6.8 g/dL (ref 6.5–8.1)

## 2021-01-26 NOTE — Progress Notes (Signed)
Office Visit Note  Patient: Chelsea Callahan             Date of Birth: [birth year]           MRN: [id number]             PCP: Bartholome Bill, MD Referring: Bartholome Bill, MD Visit Date: 02/09/2021 Occupation: @GUAROCC @  Subjective:  Medication management   History of Present Illness: Chelsea Callahan is a 70 y.o. female with a history of autoimmune disease and osteoarthritis.  She denies any history of oral ulcers, nasal ulcers, sicca symptoms, malar rash, photosensitivity.  She continues to have some Raynauds symptoms.  Her right hip joint is replaced which is causing some discomfort.  She will schedule an appointment with the orthopedic surgeon.  Her bilateral total knee replacements are doing well.  She had a lymph node biopsy from the right axillary region recently.  She has a follow-up appointment with the surgeon this week.  Activities of Daily Living:  Patient reports morning stiffness for 0 minutes.   Patient Denies nocturnal pain.  Difficulty dressing/grooming: Denies Difficulty climbing stairs: Denies Difficulty getting out of chair: Denies Difficulty using hands for taps, buttons, cutlery, and/or writing: Reports  Review of Systems  Constitutional:  Negative for fatigue.  HENT:  Negative for mouth sores, mouth dryness and nose dryness.   Eyes:  Negative for pain, itching and dryness.  Respiratory:  Negative for shortness of breath and difficulty breathing.   Cardiovascular:  Negative for chest pain and palpitations.  Gastrointestinal:  Negative for blood in stool, constipation and diarrhea.  Endocrine: Negative for increased urination.  Genitourinary:  Negative for difficulty urinating.  Musculoskeletal:  Positive for joint pain and joint pain. Negative for joint swelling, myalgias, morning stiffness, muscle tenderness and myalgias.  Skin:  Negative for color change, rash and redness.  Allergic/Immunologic: Negative for susceptible to infections.  Neurological:   Positive for numbness. Negative for dizziness, headaches, memory loss and weakness.  Hematological:  Positive for bruising/bleeding tendency.  Psychiatric/Behavioral:  Negative for confusion.    PMFS History:  Patient Active Problem List   Diagnosis Date Noted   Chronic pain of both knees 12/27/2017   Pain in left hand 12/27/2017   History of lumbar laminectomy for spinal cord decompression 12/27/2017   Lumbar stenosis 05/13/2016   High risk medication use 05/11/2016   History of total hip replacement, right 05/11/2016   Primary osteoarthritis of both feet 05/11/2016   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   Low back pain 03/09/2016   Spinal stenosis of lumbar region 03/09/2016   Osteoarthritis of right knee 05/27/2014   History of total knee replacement, bilateral 05/23/2014   Autoimmune disease ([place]) 09/24/2013   Osteoarthritis of left knee 09/13/2013    Class: Diagnosis of    Past Medical History:  Diagnosis Date   Anemia    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HCC)    Blood transfusion without reported diagnosis    Cataract    bil/ per pt   GERD (gastroesophageal reflux disease)    Primary osteoarthritis of right knee    Raynaud disease    Raynaud disease    Spinal headache    c-section   Vasculitis ([place])    on prednisone -     Family History  Problem Relation Age of Onset   Cancer Brother    Multiple myeloma Brother    Colon cancer Neg Hx    Esophageal cancer  Neg Hx    Rectal cancer Neg Hx    Stomach cancer Neg Hx    Past Surgical History:  Procedure Laterality Date   abdominal clip     to stomach post gastric bypass   AXILLARY LYMPH NODE BIOPSY Right 01/27/2021   Procedure: RIGHT AXILLARY LYMPH NODE BIOPSY;  Surgeon: Erroll Luna, MD;  Location: [place];  Service: General;  Laterality: Right;   CESAREAN SECTION     x 2   GASTRIC BYPASS     HERNIA REPAIR  2010   inverted hermia   HIP ARTHROPLASTY      JOINT REPLACEMENT Right 2005   hip   JOINT REPLACEMENT Bilateral    knee    KNEE ARTHROPLASTY Left 09/13/2013   Procedure: LEFT COMPUTER ASSISTED TOTAL KNEE ARTHROPLASTY;  Surgeon: Marybelle Killings, MD;  Location: [place];  Service: Orthopedics;  Laterality: Left;  Left Total Knee Arthroplasty, Computer Assist   KNEE ARTHROPLASTY Right 05/23/2014   Procedure: COMPUTER ASSISTED TOTAL KNEE ARTHROPLASTY;  Surgeon: Marybelle Killings, MD;  Location: [place];  Service: Orthopedics;  Laterality: Right;   LUMBAR LAMINECTOMY/DECOMPRESSION MICRODISCECTOMY N/A 05/13/2016   Procedure: L4-5 Decompression;  Surgeon: Marybelle Killings, MD;  Location: [place];  Service: Orthopedics;  Laterality: N/A;   teeth etraction     TOTAL KNEE ARTHROPLASTY Right 05/22/2014   dr Lorin Mercy   TUBAL LIGATION  1981   UPPER GI ENDOSCOPY     Social History   Social History Narrative   Not on file   Immunization History  Administered Date(s) Administered   PFIZER Comirnaty(Gray Top)Covid-19 Tri-Sucrose Vaccine 05/21/2019, 06/11/2019, 04/05/2020, 10/13/2020   PFIZER(Purple Top)SARS-COV-2 Vaccination 05/21/2019, 06/11/2019, 04/05/2020   Zoster Recombinat (Shingrix) 03/08/2018, 05/08/2018     Objective: Vital Signs: BP 126/78 (BP Location: Left Arm, Patient Position: Sitting, Cuff Size: Normal)   Pulse 69   Ht $R'5\' 2"'Af$  (1.575 m)   Wt 182 lb (82.6 kg)   BMI 33.29 kg/m    Physical Exam Vitals and nursing note reviewed.  Constitutional:      Appearance: She is well-developed.  HENT:     Head: Normocephalic and atraumatic.  Eyes:     Conjunctiva/sclera: Conjunctivae normal.  Cardiovascular:     Rate and Rhythm: Normal rate and regular rhythm.     Heart sounds: Normal heart sounds.  Pulmonary:     Effort: Pulmonary effort is normal.     Breath sounds: Normal breath sounds.  Abdominal:     General: Bowel sounds are normal.     Palpations: Abdomen is soft.  Musculoskeletal:     Cervical back: Normal range of motion.  Lymphadenopathy:      Cervical: No cervical adenopathy.  Skin:    General: Skin is warm and dry.     Capillary Refill: Capillary refill takes less than 2 seconds.  Neurological:     Mental Status: She is alert and oriented to person, place, and time.  Psychiatric:        Behavior: Behavior normal.     Musculoskeletal Exam: C-spine was in good range of motion.  Shoulder joints, elbow joints, wrist joints, MCPs PIPs and DIPs with good range of motion with no synovitis.  Right hip joint is replaced which was in good range of motion.  Bilateral knee joints were replaced which were in good range of motion left knee difference comfort.  She had no tenderness over ankles or MTPs.  CDAI Exam: CDAI Score: -- Patient Global: --; Provider  Global: -- Swollen: --; Tender: -- Joint Exam 02/09/2021   No joint exam has been documented for this visit   There is currently no information documented on the homunculus. Go to the Rheumatology activity and complete the homunculus joint exam.  Investigation: No additional findings.  Imaging: No results found.  Recent Labs: Lab Results  Component Value Date   WBC 5.9 01/26/2021   HGB 12.5 01/26/2021   PLT 258 01/26/2021   NA 138 01/26/2021   K 4.6 01/26/2021   CL 103 01/26/2021   CO2 28 01/26/2021   GLUCOSE 70 01/26/2021   BUN 11 01/26/2021   CREATININE 0.78 01/26/2021   BILITOT 0.6 01/26/2021   ALKPHOS 62 01/26/2021   AST 31 01/26/2021   ALT 20 01/26/2021   PROT 6.8 01/26/2021   ALBUMIN 3.8 01/26/2021   CALCIUM 10.2 01/26/2021   GFRAA 90 06/16/2020    Speciality Comments: PLQ eye exam:normal 06/08/2020 [facility] . Follow up in 1 year.  Procedures:  No procedures performed Allergies: Ginger, Pork-derived products, and Shellfish allergy   Assessment / Plan:     Visit Diagnoses: Autoimmune disease ([place]) - Positive ANA 1:80 nucleolar, positive RF, negative CCP, anticardiolipin IgM 21, Raynauds, questionable history of vasculitis -she is  clinically doing well.  She gives history of mild Raynauds symptoms with the weather change.  There is no history of oral ulcers, nasal ulcers, malar rash, photosensitivity.  Plan: Urinalysis, Routine w reflex microscopic, Anti-DNA antibody, double-stranded, C3 and C4, Sedimentation rate in February.  High risk medication use - Plaquenil 200 mg 1 tablet by mouth daily. PLQ eye exam:normal 06/08/2020 [facility]. - Plan: CBC with Differential/Platelet, COMPLETE METABOLIC PANEL WITH GFR.  Inflammation without immunization was placed in the AVS.  History of total hip replacement, right - 2002 in [place].  She has been having some discomfort in her right hip joint.  She plans to schedule an appointment with Dr. Lorin Mercy.  History of total knee replacement, bilateral -by Dr. Lorin Mercy.  She had RTKR 2016 LTKR 2015.  She is doing well.  Primary osteoarthritis of both feet-proper fitting shoes were discussed.  DDD Lumbar spine with spinal stenosis-she is off-and-on discomfort in her lower back.  Osteopenia of multiple sites -per patient's request the DEXA findings were reviewed.  07/22/2016 DXA LFN -1.4, BMD 0.848.  DEXA scan findings were discussed.  Calcium rich diet was discussed.  She has been exercising on a regular basis.  She will get repeat DEXA scan through her PCPs office.  Lymphadenopathy-she developed a right axillary lymph node which was recently biopsied.  She has an appointment with the surgeon on coming Friday.  Biopsy results were reviewed.  History of gastric bypass  Orders: Orders Placed This Encounter  Procedures   CBC with Differential/Platelet   COMPLETE METABOLIC PANEL WITH GFR   Urinalysis, Routine w reflex microscopic   Anti-DNA antibody, double-stranded   C3 and C4   Sedimentation rate    No orders of the defined types were placed in this encounter.    Follow-Up Instructions: Return in about 5 months (around 07/10/2021) for Autoimmune disease,  Osteoarthritis.   Bo Merino, MD  Note - This record has been created using Editor, commissioning.  Chart creation errors have been sought, but may not always  have been located. Such creation errors do not reflect on  the standard of medical care.

## 2021-01-26 NOTE — Progress Notes (Signed)

## 2021-01-26 NOTE — Progress Notes (Signed)
Sent text reminding pt to come in for lab work.  

## 2021-01-27 ENCOUNTER — Ambulatory Visit (HOSPITAL_BASED_OUTPATIENT_CLINIC_OR_DEPARTMENT_OTHER)
Admission: RE | Admit: 2021-01-27 | Discharge: 2021-01-27 | Disposition: A | Discharge status: Home / Self Care | Payer: Medicare Other | Attending: Surgery | Admitting: Surgery

## 2021-01-27 ENCOUNTER — Other Ambulatory Visit: Payer: Self-pay

## 2021-01-27 ENCOUNTER — Ambulatory Visit (HOSPITAL_BASED_OUTPATIENT_CLINIC_OR_DEPARTMENT_OTHER): Payer: Medicare Other | Admitting: Anesthesiology

## 2021-01-27 ENCOUNTER — Encounter (HOSPITAL_BASED_OUTPATIENT_CLINIC_OR_DEPARTMENT_OTHER)
Admission: RE | Disposition: A | Discharge status: Home / Self Care | Payer: Self-pay | Source: Home / Self Care | Attending: Surgery

## 2021-01-27 ENCOUNTER — Encounter (HOSPITAL_BASED_OUTPATIENT_CLINIC_OR_DEPARTMENT_OTHER): Payer: Self-pay | Admitting: Surgery

## 2021-01-27 DIAGNOSIS — Z808 Family history of malignant neoplasm of other organs or systems: Secondary | ICD-10-CM | POA: Diagnosis not present

## 2021-01-27 DIAGNOSIS — Z8349 Family history of other endocrine, nutritional and metabolic diseases: Secondary | ICD-10-CM | POA: Diagnosis not present

## 2021-01-27 DIAGNOSIS — Z91014 Allergy to mammalian meats: Secondary | ICD-10-CM | POA: Diagnosis not present

## 2021-01-27 DIAGNOSIS — R590 Localized enlarged lymph nodes: Secondary | ICD-10-CM | POA: Diagnosis not present

## 2021-01-27 DIAGNOSIS — K219 Gastro-esophageal reflux disease without esophagitis: Secondary | ICD-10-CM | POA: Diagnosis not present

## 2021-01-27 DIAGNOSIS — Z87891 Personal history of nicotine dependence: Secondary | ICD-10-CM | POA: Insufficient documentation

## 2021-01-27 DIAGNOSIS — Z91013 Allergy to seafood: Secondary | ICD-10-CM | POA: Diagnosis not present

## 2021-01-27 DIAGNOSIS — E669 Obesity, unspecified: Secondary | ICD-10-CM | POA: Diagnosis not present

## 2021-01-27 DIAGNOSIS — Z6833 Body mass index (BMI) 33.0-33.9, adult: Secondary | ICD-10-CM | POA: Insufficient documentation

## 2021-01-27 DIAGNOSIS — Z9884 Bariatric surgery status: Secondary | ICD-10-CM | POA: Insufficient documentation

## 2021-01-27 DIAGNOSIS — M48061 Spinal stenosis, lumbar region without neurogenic claudication: Secondary | ICD-10-CM | POA: Diagnosis not present

## 2021-01-27 DIAGNOSIS — R599 Enlarged lymph nodes, unspecified: Secondary | ICD-10-CM | POA: Diagnosis not present

## 2021-01-27 DIAGNOSIS — Z79899 Other long term (current) drug therapy: Secondary | ICD-10-CM | POA: Diagnosis not present

## 2021-01-27 DIAGNOSIS — I776 Arteritis, unspecified: Secondary | ICD-10-CM | POA: Diagnosis not present

## 2021-01-27 DIAGNOSIS — Z91018 Allergy to other foods: Secondary | ICD-10-CM | POA: Insufficient documentation

## 2021-01-27 DIAGNOSIS — R591 Generalized enlarged lymph nodes: Secondary | ICD-10-CM | POA: Diagnosis not present

## 2021-01-27 HISTORY — PX: AXILLARY LYMPH NODE BIOPSY: SHX5737

## 2021-01-27 SURGERY — AXILLARY LYMPH NODE BIOPSY
Anesthesia: General | Site: Axilla | Laterality: Right

## 2021-01-27 MED ORDER — ONDANSETRON HCL 4 MG/2ML IJ SOLN
4.0000 mg | Freq: Once | INTRAMUSCULAR | Status: AC | PRN
  Administered 2021-01-27: 4 mg via INTRAVENOUS

## 2021-01-27 MED ORDER — BUPIVACAINE-EPINEPHRINE 0.25% -1:200000 IJ SOLN
INTRAMUSCULAR | Status: DC | PRN
  Administered 2021-01-27: 20 mL

## 2021-01-27 MED ORDER — CEFAZOLIN SODIUM-DEXTROSE 2-4 GM/100ML-% IV SOLN
INTRAVENOUS | Status: AC
  Filled 2021-01-27: qty 100

## 2021-01-27 MED ORDER — PROPOFOL 10 MG/ML IV BOLUS
INTRAVENOUS | Status: AC
  Filled 2021-01-27: qty 20

## 2021-01-27 MED ORDER — CEFAZOLIN SODIUM-DEXTROSE 2-4 GM/100ML-% IV SOLN
2.0000 g | INTRAVENOUS | Status: AC
Start: 2021-01-27 — End: 2021-01-27
  Administered 2021-01-27: 2 g via INTRAVENOUS

## 2021-01-27 MED ORDER — OXYCODONE HCL 5 MG PO TABS
5.0000 mg | ORAL_TABLET | Freq: Once | ORAL | Status: AC | PRN
  Administered 2021-01-27: 5 mg via ORAL

## 2021-01-27 MED ORDER — LACTATED RINGERS IV SOLN: INTRAVENOUS | Status: DC

## 2021-01-27 MED ORDER — CHLORHEXIDINE GLUCONATE CLOTH 2 % EX PADS: 6.0000 | MEDICATED_PAD | Freq: Once | CUTANEOUS | Status: DC

## 2021-01-27 MED ORDER — HYDROCODONE-ACETAMINOPHEN 5-325 MG PO TABS: 1.0000 | ORAL_TABLET | Freq: Four times a day (QID) | ORAL | 0 refills | Status: DC | PRN

## 2021-01-27 MED ORDER — OXYCODONE HCL 5 MG/5ML PO SOLN: 5.0000 mg | Freq: Once | ORAL | Status: AC | PRN

## 2021-01-27 MED ORDER — LIDOCAINE 2% (20 MG/ML) 5 ML SYRINGE
INTRAMUSCULAR | Status: DC | PRN
Start: 2021-01-27 — End: 2021-01-27
  Administered 2021-01-27: 50 mg via INTRAVENOUS

## 2021-01-27 MED ORDER — MIDAZOLAM HCL 2 MG/2ML IJ SOLN
INTRAMUSCULAR | Status: AC
  Filled 2021-01-27: qty 2

## 2021-01-27 MED ORDER — FENTANYL CITRATE (PF) 100 MCG/2ML IJ SOLN
INTRAMUSCULAR | Status: AC
  Filled 2021-01-27: qty 2

## 2021-01-27 MED ORDER — FENTANYL CITRATE (PF) 100 MCG/2ML IJ SOLN
INTRAMUSCULAR | Status: DC | PRN
  Administered 2021-01-27: 50 ug via INTRAVENOUS

## 2021-01-27 MED ORDER — ONDANSETRON HCL 4 MG/2ML IJ SOLN
INTRAMUSCULAR | Status: DC | PRN
  Administered 2021-01-27: 4 mg via INTRAVENOUS

## 2021-01-27 MED ORDER — ONDANSETRON HCL 4 MG/2ML IJ SOLN
INTRAMUSCULAR | Status: AC
  Filled 2021-01-27: qty 2

## 2021-01-27 MED ORDER — FENTANYL CITRATE (PF) 100 MCG/2ML IJ SOLN: 25.0000 ug | INTRAMUSCULAR | Status: DC | PRN

## 2021-01-27 MED ORDER — OXYCODONE HCL 5 MG PO TABS
ORAL_TABLET | ORAL | Status: AC
  Filled 2021-01-27: qty 1

## 2021-01-27 MED ORDER — LIDOCAINE 2% (20 MG/ML) 5 ML SYRINGE
INTRAMUSCULAR | Status: AC
  Filled 2021-01-27: qty 5

## 2021-01-27 MED ORDER — PROPOFOL 10 MG/ML IV BOLUS
INTRAVENOUS | Status: DC | PRN
  Administered 2021-01-27: 120 mg via INTRAVENOUS

## 2021-01-27 MED ORDER — VANCOMYCIN HCL 500 MG IV SOLR
INTRAVENOUS | Status: DC | PRN
  Administered 2021-01-27: 500 mg via TOPICAL

## 2021-01-27 SURGICAL SUPPLY — 38 items
ADH SKN CLS APL DERMABOND .7 (GAUZE/BANDAGES/DRESSINGS) ×1
APL PRP STRL LF DISP 70% ISPRP (MISCELLANEOUS) ×1
BAG DECANTER FOR FLEXI CONT (MISCELLANEOUS) ×2 IMPLANT
BLADE SURG 15 STRL LF DISP TIS (BLADE) ×1 IMPLANT
BLADE SURG 15 STRL SS (BLADE) ×2
CHLORAPREP W/TINT 26 (MISCELLANEOUS) ×2 IMPLANT
COVER BACK TABLE 60X90IN (DRAPES) ×2 IMPLANT
COVER MAYO STAND STRL (DRAPES) ×2 IMPLANT
COVER PROBE W GEL 5X96 (DRAPES) IMPLANT
DERMABOND ADVANCED (GAUZE/BANDAGES/DRESSINGS) ×1
DERMABOND ADVANCED .7 DNX12 (GAUZE/BANDAGES/DRESSINGS) ×1 IMPLANT
DRAPE LAPAROSCOPIC ABDOMINAL (DRAPES) ×2 IMPLANT
DRAPE UTILITY XL STRL (DRAPES) ×2 IMPLANT
ELECT COATED BLADE 2.86 ST (ELECTRODE) ×2 IMPLANT
ELECT REM PT RETURN 9FT ADLT (ELECTROSURGICAL) ×2
ELECTRODE REM PT RTRN 9FT ADLT (ELECTROSURGICAL) ×1 IMPLANT
GLOVE SRG 8 PF TXTR STRL LF DI (GLOVE) ×1 IMPLANT
GLOVE SURG LTX SZ8 (GLOVE) ×2 IMPLANT
GLOVE SURG UNDER POLY LF SZ7 (GLOVE) ×2 IMPLANT
GLOVE SURG UNDER POLY LF SZ8 (GLOVE) ×2
GOWN STRL REUS W/ TWL LRG LVL3 (GOWN DISPOSABLE) ×1 IMPLANT
GOWN STRL REUS W/ TWL XL LVL3 (GOWN DISPOSABLE) ×1 IMPLANT
GOWN STRL REUS W/TWL LRG LVL3 (GOWN DISPOSABLE) ×2
GOWN STRL REUS W/TWL XL LVL3 (GOWN DISPOSABLE) ×2
HEMOSTAT ARISTA ABSORB 3G PWDR (HEMOSTASIS) ×2 IMPLANT
KIT MARKER MARGIN INK (KITS) IMPLANT
NEEDLE HYPO 25X1 1.5 SAFETY (NEEDLE) ×2 IMPLANT
PACK BASIN DAY SURGERY FS (CUSTOM PROCEDURE TRAY) ×2 IMPLANT
PENCIL SMOKE EVACUATOR (MISCELLANEOUS) ×2 IMPLANT
SLEEVE SCD COMPRESS KNEE MED (STOCKING) ×2 IMPLANT
SPONGE T-LAP 4X18 ~~LOC~~+RFID (SPONGE) ×2 IMPLANT
SUT MNCRL AB 3-0 PS2 18 (SUTURE) ×2 IMPLANT
SUT VICRYL 3-0 CR8 SH (SUTURE) ×2 IMPLANT
SYR BULB EAR ULCER 3OZ GRN STR (SYRINGE) ×2 IMPLANT
SYR CONTROL 10ML LL (SYRINGE) ×2 IMPLANT
TOWEL GREEN STERILE FF (TOWEL DISPOSABLE) ×2 IMPLANT
TUBE CONNECTING 20X1/4 (TUBING) ×2 IMPLANT
YANKAUER SUCT BULB TIP NO VENT (SUCTIONS) ×2 IMPLANT

## 2021-01-27 NOTE — Anesthesia Preprocedure Evaluation (Addendum)
Anesthesia Evaluation  Patient identified by MRN, date of birth, ID band Patient awake    Reviewed: Allergy & Precautions, NPO status , Patient's Chart, lab work & pertinent test results  History of Anesthesia Complications (+) POST - OP SPINAL HEADACHE and history of anesthetic complications  Airway Mallampati: II  TM Distance: >3 FB Neck ROM: Full    Dental  (+) Edentulous Upper, Dental Advisory Given, Implants   Pulmonary former smoker,    Pulmonary exam normal breath sounds clear to auscultation       Cardiovascular Normal cardiovascular exam Rhythm:Regular Rate:Normal  Raynaud's disease   Neuro/Psych  Headaches, negative psych ROS   GI/Hepatic Neg liver ROS, GERD  Medicated and Controlled,Hx/o gastric bypass   Endo/Other  Obesity  Renal/GU negative Renal ROS  negative genitourinary   Musculoskeletal  (+) Arthritis , Osteoarthritis and Rheumatoid disorders,  Right axillary lymphadenopathy   Abdominal (+) + obese,   Peds  Hematology  (+) anemia , Hx/o autoimmune neutropenia   Anesthesia Other Findings   Reproductive/Obstetrics                            Anesthesia Physical Anesthesia Plan  ASA: 3  Anesthesia Plan: General   Post-op Pain Management:    Induction: Intravenous  PONV Risk Score and Plan: 3 and Treatment may vary due to age or medical condition and Ondansetron  Airway Management Planned: LMA  Additional Equipment:   Intra-op Plan:   Post-operative Plan: Extubation in OR  Informed Consent: I have reviewed the patients History and Physical, chart, labs and discussed the procedure including the risks, benefits and alternatives for the proposed anesthesia with the patient or authorized representative who has indicated his/her understanding and acceptance.     Dental advisory given  Plan Discussed with: CRNA and Anesthesiologist  Anesthesia Plan Comments:         Anesthesia Quick Evaluation

## 2021-01-27 NOTE — Interval H&P Note (Signed)
History and Physical Interval Note:  01/27/2021 9:33 AM  Jera Headings  has presented today for surgery, with the diagnosis of LYMPHADENOPATHY.  The various methods of treatment have been discussed with the patient and family. After consideration of risks, benefits and other options for treatment, the patient has consented to  Procedure(s): RIGHT AXILLARY LYMPH NODE BIOPSY (Right) as a surgical intervention.  The patient's history has been reviewed, patient examined, no change in status, stable for surgery.  I have reviewed the patient's chart and labs.  Questions were answered to the patient's satisfaction.     Tameya Kuznia A Dawnisha Marquina

## 2021-01-27 NOTE — Op Note (Signed)
Preoperative diagnosis: Right axillary lymphadenopathy  Postoperative diagnosis: Same  Procedure: Right axillary lymph node biopsy: Deep  Surgeon: Erroll Luna, MD D  Anesthesia: LMA with 0.25% Marcaine plain  EBL: Minimal  Drains: None  Specimen: Enlarged right axillary node to pathology  Indications for procedure: The patient is a 70 year old female with noted significant clinically evident right axillary adenopathy.  She was sent for evaluation to exclude lymphoma.  Risk and benefits of surgery discussed.  Complications discussed.Lymph node biopsy and dissection has been discussed with the patient.  Risk of bleeding,  Infection,  Seroma formation,  Additional procedures,, arm swelling  Shoulder weakness ,  Shoulder stiffness,  Nerve and blood vessel injury and reaction to the mapping dyes have been discussed.  Alternatives to surgery have been discussed with the patient.  The patient agrees to proceed.   Description of procedure: Patient met in the holding area and questions were answered.  Right axilla marked as correct site.  She was then taken back to the operating room.  She is placed supine upon the OR table.  After induction of general esthesia, right axilla was prepped and draped in a sterile fashion.  Timeout performed.  4 cm incision made in the right axilla.  Dissection was carried down to the level 1 level 2 contents.  There are multiple enlarged pathologic lymph nodes encountered.  A 3 cm was identified and removed.  Hemostasis achieved.  The intercostal brachial nerve, axillary vein, thoracodorsal trunk and long thoracic nerves were all preserved.  Hemostasis achieved.  Arista placed in the cavity.  Local anesthetic infiltrated in the cavity of 0.25% Marcaine.  Cavity closed with 3-0 Vicryl and skin closed with 4 Monocryl subcuticular stitch.  Dermabond applied.  All counts found to be correct.  Breast binder placed.  Patient awoke extubated taken to recovery in satisfactory  condition.

## 2021-01-27 NOTE — Anesthesia Postprocedure Evaluation (Signed)
Anesthesia Post Note  Patient: Chelsea Callahan  Procedure(s) Performed: RIGHT AXILLARY LYMPH NODE BIOPSY (Right: Axilla)     Patient location during evaluation: PACU Anesthesia Type: General Level of consciousness: awake and alert and oriented Pain management: pain level controlled Vital Signs Assessment: post-procedure vital signs reviewed and stable Respiratory status: spontaneous breathing, nonlabored ventilation and respiratory function stable Cardiovascular status: blood pressure returned to baseline and stable Postop Assessment: no apparent nausea or vomiting Anesthetic complications: no   No notable events documented.  Last Vitals:  Vitals:   01/27/21 1045 01/27/21 1100  BP: (!) 159/79 (!) 163/82  Pulse: (!) 56 (!) 57  Resp: 13 14  Temp:    SpO2: 97% 96%    Last Pain:  Vitals:   01/27/21 1045  TempSrc:   PainSc: 0-No pain                 Gryphon Vanderveen A.

## 2021-01-27 NOTE — H&P (Signed)
Subjective   Chief Complaint: New Consultation   History of Present Illness: Chelsea Callahan is a 70 y.o. female who is seen today as an office consultation at the request of Dr. Leavy Cella for evaluation of New Consultation .   Pt noted to have enalarged lymph node right axilla after mammogram and U/S for follow up of breast mass  bx showed B cell proliferation Family hx of MM in brother  No fever chills or weight loss or night sweats   Review of Systems: A complete review of systems was obtained from the patient. I have reviewed this information and discussed as appropriate with the patient. See HPI as well for other ROS.    Medical History: Past Medical History:  Diagnosis Date   Arthritis   There is no problem list on file for this patient.  Past Surgical History:  Procedure Laterality Date   DURAL GRAFT SPINAL   EPIGASTRIC HERNIA REPAIR   GASTRIC BYPASS OPEN   REPLACEMENT TOTAL KNEE BILATERAL    Allergies  Allergen Reactions   Ginger Swelling  Stomach swells Stomach swells Stomach swells   Pork Derived (Porcine) Other (See Comments)  UNSPECIFIED "PERSONAL REASONS"   Shellfish Containing Products Other (See Comments)  UNSPECIFIED "Personal reasons/dietary"   Current Outpatient Medications on File Prior to Visit  Medication Sig Dispense Refill   ferrous sulfate 325 (65 FE) MG tablet Take 325 mg by mouth daily with breakfast   multivitamin tablet Take 1 tablet by mouth once daily   vitamin E-glycerin-dimethicone (CETAPHIL MOISTURIZING) lotion Apply topically as needed for Dry Skin   turmeric, bulk, 95 % Powd Take 1 capsule by mouth once daily   No current facility-administered medications on file prior to visit.   Family History  Problem Relation Age of Onset   Obesity Mother   Obesity Father    Social History   Tobacco Use  Smoking Status Former Smoker   Quit date: 1999   Years since quitting: 23.6  Smokeless Tobacco Never Used    Social History    Socioeconomic History   Marital status: Married  Tobacco Use   Smoking status: Former Smoker  Quit date: 1999  Years since quitting: 23.6   Smokeless tobacco: Never Used  Substance and Sexual Activity   Drug use: Never   Objective:   Vitals:  12/16/20 1334  BP: (!) 140/80  Pulse: 80  Temp: 36.4 C (97.6 F)  SpO2: 98%  Weight: 80.7 kg (178 lb)  Height: 157.5 cm (5\' 2" )   Body mass index is 32.56 kg/m.  Physical Exam Constitutional:  Appearance: Normal appearance.  HENT:  Head: Normocephalic.  Nose: Nose normal.  Eyes:  Pupils: Pupils are equal, round, and reactive to light.  Cardiovascular:  Rate and Rhythm: Normal rate.  Pulses: Normal pulses.  Pulmonary:  Effort: Pulmonary effort is normal.  Breath sounds: No stridor.  Abdominal:  General: Abdomen is flat.  Musculoskeletal:  General: Normal range of motion.  Cervical back: Normal range of motion and neck supple. No tenderness.  Lymphadenopathy:  Cervical:  Right cervical: No superficial, deep or posterior cervical adenopathy. Left cervical: No superficial, deep or posterior cervical adenopathy.  Upper Body:  Right upper body: Axillary adenopathy present. No supraclavicular or epitrochlear adenopathy.  Left upper body: No supraclavicular, axillary or epitrochlear adenopathy.  Lower Body: No right inguinal adenopathy. No left inguinal adenopathy.  Skin: General: Skin is warm.  Neurological:  Mental Status: She is alert.  Psychiatric:  Mood and Affect: Mood normal.  Labs, Imaging and Diagnostic Testing: Patient returns after screening study for evaluation of possible LEFT breast asymmetry. Patient has history of autoimmune disease and takes Plaquenil. Patient had Covid booster in the LEFT arm approximately 1 month ago. Patient was diagnosed with Covid in January 2022.   EXAM: DIGITAL DIAGNOSTIC UNILATERAL LEFT MAMMOGRAM WITH TOMOSYNTHESIS AND CAD; ULTRASOUND LEFT BREAST LIMITED; [place] AXILLARY  RIGHT   TECHNIQUE: Left digital diagnostic mammography and breast tomosynthesis was performed. The images were evaluated with computer-aided detection.; Targeted ultrasound examination of the left breast was performed; Targeted ultrasound examination of the right axilla was performed.   COMPARISON: Previous exam(s).   ACR Breast Density Category b: There are scattered areas of fibroglandular density.   FINDINGS: Additional 2-D and 3-D images are performed. These views show no persistent asymmetry in the MEDIAL aspect of the LEFT breast.   Note is made of prominent LOWER LEFT axillary lymph nodes, further evaluated with ultrasound.   Targeted ultrasound is performed, showing prominent LEFT axillary lymph nodes. The cortex of these lymph node measures up to 3.8 millimeters. Nodes appear symmetric in size and morphology.   Targeted ultrasound is performed of the RIGHT axilla for comparison, showing lymph nodes with similar morphology as those on the LEFT, with mildly prominent cortex measuring up to 3 millimeters. However, there is a single lymph node with more hypoechoic cortex measuring up to 6.7 millimeters and demonstrating significant vascularity on Doppler evaluation.   IMPRESSION: 1. No persistent mass in the MEDIAL portion of the LEFT breast. 2. Mildly prominent bilateral axillary lymph nodes. 3. Single RIGHT axillary lymph node has markedly thickened and hypoechoic cortex, warranting tissue diagnosis.   RECOMMENDATION: Recommend ultrasound-guided core biopsy of RIGHT axillary lymph node. If the node is benign, recommend follow-up bilateral axillary lymph nodes in 3 months.   I have discussed the findings and recommendations with the patient. If applicable, a reminder letter will be sent to the patient regarding the next appointment.   BI-RADS CATEGORY 4: Suspicious.     Electronically Signed By: Norva Pavlov M.D. 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  This is admixed with a  relatively minor population of T cells with nonspecific changes.  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   Assessment and Plan:  Diagnoses and all orders for this visit:  Lymphadenopathy, axillary   Recommend lymph node biopsy right axilla   the procedure has been discussed with the patient. Alternative therapies have been discussed with the patient. Operative risks include bleeding, Infection, Organ injury, Nerve injury, Blood vessel injury, DVT, ARM SWELLING Pulmonary embolism, Death, And possible reoperation. Medical management risks include worsening of present situation. The success of the procedure is 50 -90 % at treating patients symptoms. The patient understands and agrees to proceed.  No follow-ups on file.  Hayden Rasmussen, MD   I spent a total of 33 minutes in both face-to-face and non-face-to-face activities for this visit on the date of this encounter.

## 2021-01-27 NOTE — Discharge Instructions (Addendum)
GENERAL SURGERY: POST OP INSTRUCTIONS  ######################################################################  EAT Gradually transition to a high fiber diet with a fiber supplement over the next few weeks after discharge.  Start with a pureed / full liquid diet (see below)  WALK Walk an hour a day.  Control your pain to do that.    CONTROL PAIN Control pain so that you can walk, sleep, tolerate sneezing/coughing, go up/down stairs.  HAVE A BOWEL MOVEMENT DAILY Keep your bowels regular to avoid problems.  OK to try a laxative to override constipation.  OK to use an antidairrheal to slow down diarrhea.  Call if not better after 2 tries  CALL IF YOU HAVE PROBLEMS/CONCERNS Call if you are still struggling despite following these instructions. Call if you have concerns not answered by these instructions  ######################################################################    DIET: Follow a light bland diet & liquids the first 24 hours after arrival home, such as soup, liquids, starches, etc.  Be sure to drink plenty of fluids.  Quickly advance to a usual solid diet within a few days.  Avoid fast food or heavy meals as your are more likely to get nauseated or have irregular bowels.  A low-fat, high-fiber diet for the rest of your life is ideal.   Take your usually prescribed home medications unless otherwise directed. PAIN CONTROL: Pain is best controlled by a usual combination of three different methods TOGETHER: Ice/Heat Over the counter pain medication Prescription pain medication Most patients will experience some swelling and bruising around the incisions.  Ice packs or heating pads (30-60 minutes up to 6 times a day) will help. Use ice for the first few days to help decrease swelling and bruising, then switch to heat to help relax tight/sore spots and speed recovery.  Some people prefer to use ice alone, heat alone, alternating between ice & heat.  Experiment to what works for you.   Swelling and bruising can take several weeks to resolve.   It is helpful to take an over-the-counter pain medication regularly for the first few weeks.  Choose one of the following that works best for you: Naproxen (Aleve, etc)  Two 220mg tabs twice a day Ibuprofen (Advil, etc) Three 200mg tabs four times a day (every meal & bedtime) Acetaminophen (Tylenol, etc) 500-650mg four times a day (every meal & bedtime) A  prescription for pain medication (such as oxycodone, hydrocodone, etc) should be given to you upon discharge.  Take your pain medication as prescribed.  If you are having problems/concerns with the prescription medicine (does not control pain, nausea, vomiting, rash, itching, etc), please call us [phone] to see if we need to switch you to a different pain medicine that will work better for you and/or control your side effect better. If you need a refill on your pain medication, please contact your pharmacy.  They will contact our office to request authorization. Prescriptions will not be filled after 5 pm or on week-ends. Avoid getting constipated.  Between the surgery and the pain medications, it is common to experience some constipation.  Increasing fluid intake and taking a fiber supplement (such as Metamucil, Citrucel, FiberCon, MiraLax, etc) 1-2 times a day regularly will usually help prevent this problem from occurring.  A mild laxative (prune juice, Milk of Magnesia, MiraLax, etc) should be taken according to package directions if there are no bowel movements after 48 hours.   Wash / shower every day.  You may shower over the dressings as they are waterproof.  Continue to   shower over incision(s) after the dressing is off. Remove your waterproof bandages 5 days after surgery.  You may leave the incision open to air.  You may have skin tapes (Steri Strips) covering the incision(s).  Leave them on until one week, then remove.  You may replace a dressing/Band-Aid to cover the incision  for comfort if you wish.      ACTIVITIES as tolerated:   You may resume regular (light) daily activities beginning the next day--such as daily self-care, walking, climbing stairs--gradually increasing activities as tolerated.  If you can walk 30 minutes without difficulty, it is safe to try more intense activity such as jogging, treadmill, bicycling, low-impact aerobics, swimming, etc. Save the most intensive and strenuous activity for last such as sit-ups, heavy lifting, contact sports, etc  Refrain from any heavy lifting or straining until you are off narcotics for pain control.   DO NOT PUSH THROUGH PAIN.  Let pain be your guide: If it hurts to do something, don't do it.  Pain is your body warning you to avoid that activity for another week until the pain goes down. You may drive when you are no longer taking prescription pain medication, you can comfortably wear a seatbelt, and you can safely maneuver your car and apply brakes. You may have sexual intercourse when it is comfortable.  FOLLOW UP in our office Please call [facility] at [phone] to set up an appointment to see your surgeon in the office for a follow-up appointment approximately 2-3 weeks after your surgery. Make sure that you call for this appointment the day you arrive home to insure a convenient appointment time. 9. IF YOU HAVE DISABILITY OR FAMILY LEAVE FORMS, BRING THEM TO THE OFFICE FOR PROCESSING.  DO NOT GIVE THEM TO YOUR DOCTOR.   WHEN TO CALL US [phone]: Poor pain control Reactions / problems with new medications (rash/itching, nausea, etc)  Fever over 101.5 F (38.5 C) Worsening swelling or bruising Continued bleeding from incision. Increased pain, redness, or drainage from the incision Difficulty breathing / swallowing   The clinic staff is available to answer your questions during regular business hours (8:30am-5pm).  Please don't hesitate to call and ask to speak to one of our nurses for clinical concerns.    If you have a medical emergency, go to the nearest emergency room or call 911.  A surgeon from [facility] is always on call at the hospitals   [facility] [address], [address], [place], [place]  [address] ? MAIN: [phone] ? TOLL FREE: [phone] ?  FAX [phone] [email or web address]    Post Anesthesia Home Care Instructions  Activity: Get plenty of rest for the remainder of the day. A responsible individual must stay with you for 24 hours following the procedure.  For the next 24 hours, DO NOT: -Drive a car -Operate machinery -Drink alcoholic beverages -Take any medication unless instructed by your physician -Make any legal decisions or sign important papers.  Meals: Start with liquid foods such as gelatin or soup. Progress to regular foods as tolerated. Avoid greasy, spicy, heavy foods. If nausea and/or vomiting occur, drink only clear liquids until the nausea and/or vomiting subsides. Call your physician if vomiting continues.  Special Instructions/Symptoms: Your throat may feel dry or sore from the anesthesia or the breathing tube placed in your throat during surgery. If this causes discomfort, gargle with warm salt water. The discomfort should disappear within 24 hours.  If you had   a scopolamine patch placed behind your ear for the management of post- operative nausea and/or vomiting:  1. The medication in the patch is effective for 72 hours, after which it should be removed.  Wrap patch in a tissue and discard in the trash. Wash hands thoroughly with soap and water. 2. You may remove the patch earlier than 72 hours if you experience unpleasant side effects which may include dry mouth, dizziness or visual disturbances. 3. Avoid touching the patch. Wash your hands with soap and water after contact with the patch.     

## 2021-01-27 NOTE — Transfer of Care (Signed)
Immediate Anesthesia Transfer of Care Note  Patient: Chelsea Callahan  Procedure(s) Performed: RIGHT AXILLARY LYMPH NODE BIOPSY (Right: Axilla)  Patient Location: PACU  Anesthesia Type:General  Level of Consciousness: awake, alert  and oriented  Airway & Oxygen Therapy: Patient Spontanous Breathing and Patient connected to face mask oxygen  Post-op Assessment: Report given to RN, Post -op Vital signs reviewed and stable and Patient moving all extremities X 4  Post vital signs: Reviewed and stable  Last Vitals:  Vitals Value Taken Time  BP 156/78   Temp    Pulse 63   Resp 19 01/27/21 1024  SpO2 100   Vitals shown include unvalidated device data.  Last Pain:  Vitals:   01/27/21 0850  TempSrc: Oral  PainSc: 0-No pain         Complications: No notable events documented.

## 2021-01-27 NOTE — Anesthesia Procedure Notes (Signed)
Procedure Name: LMA Insertion Date/Time: 01/27/2021 9:45 AM Performed by: Nelle Don, CRNA Pre-anesthesia Checklist: Patient identified, Emergency Drugs available, Suction available and Patient being monitored Patient Re-evaluated:Patient Re-evaluated prior to induction Oxygen Delivery Method: Circle system utilized Preoxygenation: Pre-oxygenation with 100% oxygen Induction Type: IV induction LMA: LMA inserted LMA Size: 4.0 Number of attempts: 1 Dental Injury: Teeth and Oropharynx as per pre-operative assessment

## 2021-01-28 ENCOUNTER — Encounter (HOSPITAL_BASED_OUTPATIENT_CLINIC_OR_DEPARTMENT_OTHER): Payer: Self-pay | Admitting: Surgery

## 2021-01-29 LAB — SURGICAL PATHOLOGY

## 2021-01-30 ENCOUNTER — Encounter: Payer: Self-pay | Admitting: Surgery

## 2021-02-09 ENCOUNTER — Encounter: Payer: Self-pay | Admitting: Rheumatology

## 2021-02-09 ENCOUNTER — Other Ambulatory Visit: Payer: Self-pay

## 2021-02-09 ENCOUNTER — Ambulatory Visit (INDEPENDENT_AMBULATORY_CARE_PROVIDER_SITE_OTHER): Payer: Medicare Other | Admitting: Rheumatology

## 2021-02-09 VITALS — BP 126/78 | HR 69 | Ht 62.0 in | Wt 182.0 lb

## 2021-02-09 DIAGNOSIS — Z96641 Presence of right artificial hip joint: Secondary | ICD-10-CM

## 2021-02-09 DIAGNOSIS — R591 Generalized enlarged lymph nodes: Secondary | ICD-10-CM | POA: Diagnosis not present

## 2021-02-09 DIAGNOSIS — M8589 Other specified disorders of bone density and structure, multiple sites: Secondary | ICD-10-CM | POA: Diagnosis not present

## 2021-02-09 DIAGNOSIS — Z9884 Bariatric surgery status: Secondary | ICD-10-CM

## 2021-02-09 DIAGNOSIS — M47816 Spondylosis without myelopathy or radiculopathy, lumbar region: Secondary | ICD-10-CM | POA: Diagnosis not present

## 2021-02-09 DIAGNOSIS — Z96653 Presence of artificial knee joint, bilateral: Secondary | ICD-10-CM

## 2021-02-09 DIAGNOSIS — M359 Systemic involvement of connective tissue, unspecified: Secondary | ICD-10-CM

## 2021-02-09 DIAGNOSIS — M19071 Primary osteoarthritis, right ankle and foot: Secondary | ICD-10-CM

## 2021-02-09 DIAGNOSIS — Z79899 Other long term (current) drug therapy: Secondary | ICD-10-CM | POA: Diagnosis not present

## 2021-02-09 DIAGNOSIS — M19072 Primary osteoarthritis, left ankle and foot: Secondary | ICD-10-CM | POA: Diagnosis not present

## 2021-02-09 NOTE — Patient Instructions (Signed)
Standing Labs We placed an order today for your standing lab work.   Please have your standing labs drawn in February  If possible, please have your labs drawn 2 weeks prior to your appointment so that the provider can discuss your results at your appointment.  Please note that you may see your imaging and lab results in MyChart before we have reviewed them. We may be awaiting multiple results to interpret others before contacting you. Please allow our office up to 72 hours to thoroughly review all of the results before contacting the office for clarification of your results.  We have open lab daily: Monday through Thursday from 1:30-4:30 PM and Friday from 1:30-4:00 PM at the office of Dr. Jacey Eckerson, [facility].   Please be advised, all patients with office appointments requiring lab work will take precedent over walk-in lab work.  If possible, please come for your lab work on Monday and Friday afternoons, as you may experience shorter wait times. The office is located at [address], [address], [place], [place] [address] No appointment is necessary.   Labs are drawn by [facility]. Please bring your co-pay at the time of your lab draw.  You may receive a bill from [facility] for your lab work.  If you wish to have your labs drawn at another location, please call the office 24 hours in advance to send orders.  If you have any questions regarding directions or hours of operation,  please call [phone].   As a reminder, please drink plenty of water prior to coming for your lab work. Thanks!   Vaccines You are taking a medication(s) that can suppress your immune system.  The following immunizations are recommended: Flu annually Covid-19  Td/Tdap (tetanus, diphtheria, pertussis) every 10 years Pneumonia (Prevnar 15 then Pneumovax 23 at least 1 year apart.  Alternatively, can take Prevnar 20 without needing additional dose) Shingrix: 2 doses from 4 weeks to 6 months  apart  Please check with your PCP to make sure you are up to date.  

## 2021-02-16 DIAGNOSIS — Z0000 Encounter for general adult medical examination without abnormal findings: Secondary | ICD-10-CM | POA: Diagnosis not present

## 2021-02-16 DIAGNOSIS — Z6832 Body mass index (BMI) 32.0-32.9, adult: Secondary | ICD-10-CM | POA: Diagnosis not present

## 2021-02-16 DIAGNOSIS — Z008 Encounter for other general examination: Secondary | ICD-10-CM | POA: Diagnosis not present

## 2021-02-16 DIAGNOSIS — Z79899 Other long term (current) drug therapy: Secondary | ICD-10-CM | POA: Diagnosis not present

## 2021-02-16 DIAGNOSIS — M159 Polyosteoarthritis, unspecified: Secondary | ICD-10-CM | POA: Diagnosis not present

## 2021-02-16 DIAGNOSIS — F17211 Nicotine dependence, cigarettes, in remission: Secondary | ICD-10-CM | POA: Diagnosis not present

## 2021-02-16 DIAGNOSIS — I700 Atherosclerosis of aorta: Secondary | ICD-10-CM | POA: Diagnosis not present

## 2021-02-16 DIAGNOSIS — I38 Endocarditis, valve unspecified: Secondary | ICD-10-CM | POA: Diagnosis not present

## 2021-02-16 DIAGNOSIS — M359 Systemic involvement of connective tissue, unspecified: Secondary | ICD-10-CM | POA: Diagnosis not present

## 2021-02-16 DIAGNOSIS — M8580 Other specified disorders of bone density and structure, unspecified site: Secondary | ICD-10-CM | POA: Diagnosis not present

## 2021-02-16 DIAGNOSIS — E669 Obesity, unspecified: Secondary | ICD-10-CM | POA: Diagnosis not present

## 2021-02-16 DIAGNOSIS — E559 Vitamin D deficiency, unspecified: Secondary | ICD-10-CM | POA: Diagnosis not present

## 2021-03-05 DIAGNOSIS — M8580 Other specified disorders of bone density and structure, unspecified site: Secondary | ICD-10-CM | POA: Diagnosis not present

## 2021-03-05 DIAGNOSIS — Z0001 Encounter for general adult medical examination with abnormal findings: Secondary | ICD-10-CM | POA: Diagnosis not present

## 2021-03-05 DIAGNOSIS — F17211 Nicotine dependence, cigarettes, in remission: Secondary | ICD-10-CM | POA: Diagnosis not present

## 2021-03-05 DIAGNOSIS — E669 Obesity, unspecified: Secondary | ICD-10-CM | POA: Diagnosis not present

## 2021-03-05 DIAGNOSIS — I700 Atherosclerosis of aorta: Secondary | ICD-10-CM | POA: Diagnosis not present

## 2021-03-05 DIAGNOSIS — Z7189 Other specified counseling: Secondary | ICD-10-CM | POA: Diagnosis not present

## 2021-03-05 DIAGNOSIS — M4316 Spondylolisthesis, lumbar region: Secondary | ICD-10-CM | POA: Diagnosis not present

## 2021-03-05 DIAGNOSIS — R0989 Other specified symptoms and signs involving the circulatory and respiratory systems: Secondary | ICD-10-CM | POA: Diagnosis not present

## 2021-03-05 DIAGNOSIS — M359 Systemic involvement of connective tissue, unspecified: Secondary | ICD-10-CM | POA: Diagnosis not present

## 2021-03-05 DIAGNOSIS — Z789 Other specified health status: Secondary | ICD-10-CM | POA: Diagnosis not present

## 2021-03-05 DIAGNOSIS — M159 Polyosteoarthritis, unspecified: Secondary | ICD-10-CM | POA: Diagnosis not present

## 2021-03-05 DIAGNOSIS — I38 Endocarditis, valve unspecified: Secondary | ICD-10-CM | POA: Diagnosis not present

## 2021-03-08 ENCOUNTER — Ambulatory Visit
Admission: RE | Admit: 2021-03-08 | Discharge: 2021-03-08 | Disposition: A | Discharge status: Home / Self Care | Payer: Medicare Other | Source: Ambulatory Visit | Attending: Family Medicine | Admitting: Family Medicine

## 2021-03-08 ENCOUNTER — Other Ambulatory Visit: Payer: Self-pay

## 2021-03-08 ENCOUNTER — Other Ambulatory Visit: Payer: Self-pay | Admitting: Family Medicine

## 2021-03-08 DIAGNOSIS — R0989 Other specified symptoms and signs involving the circulatory and respiratory systems: Secondary | ICD-10-CM

## 2021-04-05 ENCOUNTER — Other Ambulatory Visit: Payer: Self-pay | Admitting: Physician Assistant

## 2021-04-05 NOTE — Telephone Encounter (Signed)
Next Visit: 07/06/2021  Last Visit: 02/09/2021  Labs: 01/26/2021 WNL  Eye exam: 06/08/2020   Current Dose per office note 02/09/2021: Plaquenil 200 mg 1 tablet by mouth daily  CH:YIFOYDXAJO disease   Last Fill: 01/05/2021  Okay to refill Plaquenil?

## 2021-06-16 DIAGNOSIS — D899 Disorder involving the immune mechanism, unspecified: Secondary | ICD-10-CM | POA: Diagnosis not present

## 2021-06-16 DIAGNOSIS — H04123 Dry eye syndrome of bilateral lacrimal glands: Secondary | ICD-10-CM | POA: Diagnosis not present

## 2021-06-16 DIAGNOSIS — H2513 Age-related nuclear cataract, bilateral: Secondary | ICD-10-CM | POA: Diagnosis not present

## 2021-06-16 DIAGNOSIS — Z79899 Other long term (current) drug therapy: Secondary | ICD-10-CM | POA: Diagnosis not present

## 2021-06-22 NOTE — Progress Notes (Signed)
Office Visit Note  Patient: Chelsea Callahan             Date of Birth: 1951/02/03           MRN: [id number]             PCP: Bartholome Bill, MD Referring: Bartholome Bill, MD Visit Date: 07/06/2021 Occupation: @GUAROCC @  Subjective:  Pain in multiple joints   History of Present Illness: Chelsea Callahan is a 71 y.o. female with history of autoimmune disease and osteoarthritis.  Patient is currently taking Plaquenil 200 mg 1 tablet by mouth daily.  She continues to tolerate plaquenil without any side effects and has not missed any doses.  She presents today with increased pain and inflammation involving multiple joints including both knee replacements and both ankle joints.  She is also having pain in her lower back and right hip.  She takes naproxen as needed for pain relief.  She denies any facial rashes.  She has 1 recurrent sore in her mouth which she attributes to rubbing on her partial. She denies any nose sores.  She has intermittent symptoms of raynaud's but denies any digital ulcerations. She denies any SOB, pleuritic chest pain, and palpitations.    Activities of Daily Living:  Patient reports morning stiffness for all day.  Patient Denies nocturnal pain.  Difficulty dressing/grooming: Denies Difficulty climbing stairs: Denies Difficulty getting out of chair: Denies Difficulty using hands for taps, buttons, cutlery, and/or writing: Reports  Review of Systems  Constitutional:  Negative for fatigue.  HENT:  Positive for mouth sores. Negative for mouth dryness and nose dryness.   Eyes:  Positive for dryness. Negative for pain and itching.  Respiratory:  Negative for shortness of breath and difficulty breathing.   Cardiovascular:  Positive for palpitations. Negative for chest pain.  Gastrointestinal:  Negative for blood in stool, constipation and diarrhea.  Endocrine: Negative for increased urination.  Genitourinary:  Negative for difficulty urinating.  Musculoskeletal:   Positive for joint pain, joint pain, joint swelling and morning stiffness. Negative for myalgias, muscle tenderness and myalgias.  Skin:  Positive for color change. Negative for rash, redness and sensitivity to sunlight.  Allergic/Immunologic: Negative for susceptible to infections.  Neurological:  Positive for numbness. Negative for dizziness, headaches, memory loss and weakness.  Hematological:  Positive for bruising/bleeding tendency.  Psychiatric/Behavioral:  Negative for confusion.    PMFS History:  Patient Active Problem List   Diagnosis Date Noted   Chronic pain of both knees 12/27/2017   Pain in left hand 12/27/2017   History of lumbar laminectomy for spinal cord decompression 12/27/2017   Lumbar stenosis 05/13/2016   High risk medication use 05/11/2016   History of total hip replacement, right 05/11/2016   Primary osteoarthritis of both feet 05/11/2016   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   Low back pain 03/09/2016   Spinal stenosis of lumbar region 03/09/2016   Osteoarthritis of right knee 05/27/2014   History of total knee replacement, bilateral 05/23/2014   Autoimmune disease ([place]) 09/24/2013   Osteoarthritis of left knee 09/13/2013    Class: Diagnosis of    Past Medical History:  Diagnosis Date   Anemia    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[place])    Blood transfusion without reported diagnosis    Cataract    bil/ per pt   GERD (gastroesophageal reflux disease)    Primary osteoarthritis of right knee    Raynaud disease    Raynaud  disease    Spinal headache    c-section   Vasculitis ([facility])    on prednisone -     Family History  Problem Relation Age of Onset   Cancer Brother    Multiple myeloma Brother    Colon cancer Neg Hx    Esophageal cancer Neg Hx    Rectal cancer Neg Hx    Stomach cancer Neg Hx    Past Surgical History:  Procedure Laterality Date   abdominal clip     to stomach post gastric bypass   AXILLARY  LYMPH NODE BIOPSY Right 01/27/2021   Procedure: RIGHT AXILLARY LYMPH NODE BIOPSY;  Surgeon: Erroll Luna, MD;  Location: [facility];  Service: General;  Laterality: Right;   CESAREAN SECTION     x 2   GASTRIC BYPASS     HERNIA REPAIR  2010   inverted hermia   HIP ARTHROPLASTY     JOINT REPLACEMENT Right 2005   hip   JOINT REPLACEMENT Bilateral    knee    KNEE ARTHROPLASTY Left 09/13/2013   Procedure: LEFT COMPUTER ASSISTED TOTAL KNEE ARTHROPLASTY;  Surgeon: Marybelle Killings, MD;  Location: [facility];  Service: Orthopedics;  Laterality: Left;  Left Total Knee Arthroplasty, Computer Assist   KNEE ARTHROPLASTY Right 05/23/2014   Procedure: COMPUTER ASSISTED TOTAL KNEE ARTHROPLASTY;  Surgeon: Marybelle Killings, MD;  Location: [facility];  Service: Orthopedics;  Laterality: Right;   LUMBAR LAMINECTOMY/DECOMPRESSION MICRODISCECTOMY N/A 05/13/2016   Procedure: L4-5 Decompression;  Surgeon: Marybelle Killings, MD;  Location: [facility];  Service: Orthopedics;  Laterality: N/A;   teeth etraction     TOTAL KNEE ARTHROPLASTY Right 05/22/2014   dr Lorin Mercy   TUBAL LIGATION  1981   UPPER GI ENDOSCOPY     Social History   Social History Narrative   Not on file   Immunization History  Administered Date(s) Administered   PFIZER Comirnaty(Gray Top)Covid-19 Tri-Sucrose Vaccine 05/21/2019, 06/11/2019, 04/05/2020, 10/13/2020   PFIZER(Purple Top)SARS-COV-2 Vaccination 05/21/2019, 06/11/2019, 04/05/2020   Zoster Recombinat (Shingrix) 03/08/2018, 05/08/2018     Objective: Vital Signs: BP 129/76 (BP Location: Left Arm, Patient Position: Sitting, Cuff Size: Normal)    Pulse 73    Ht _0  (1.575 m)    Wt 186 lb (84.4 kg)    BMI 34.02 kg/m    Physical Exam Vitals and nursing note reviewed.  Constitutional:      Appearance: She is well-developed.  HENT:     Head: Normocephalic and atraumatic.  Eyes:     Conjunctiva/sclera: Conjunctivae normal.  Cardiovascular:     Rate and Rhythm: Normal rate and regular rhythm.      Heart sounds: Normal heart sounds.  Pulmonary:     Effort: Pulmonary effort is normal.     Breath sounds: Normal breath sounds.     Comments: Crackles in bilateral lung bases Abdominal:     General: Bowel sounds are normal.     Palpations: Abdomen is soft.  Musculoskeletal:     Cervical back: Normal range of motion.  Lymphadenopathy:     Cervical: No cervical adenopathy.  Skin:    General: Skin is warm and dry.     Capillary Refill: Capillary refill takes less than 2 seconds.  Neurological:     Mental Status: She is alert and oriented to person, place, and time.  Psychiatric:        Behavior: Behavior normal.     Musculoskeletal Exam: C-spine was in good range of motion.  Shoulder joints, elbow joints, wrist joints, MCPs PIPs and DIPs and good range of motion with no synovitis.  She had discomfort range of motion of her right hip which is replaced.  Her bilateral knee joints were replaced.  No effusion or warmth was noted.  There was no tenderness over ankles or MTPs.  CDAI Exam: CDAI Score: -- Patient Global: --; Provider Global: -- Swollen: --; Tender: -- Joint Exam 07/06/2021   No joint exam has been documented for this visit   There is currently no information documented on the homunculus. Go to the Rheumatology activity and complete the homunculus joint exam.  Investigation: No additional findings.  Imaging: No results found.  Recent Labs: Lab Results  Component Value Date   WBC 5.9 01/26/2021   HGB 12.5 01/26/2021   PLT 258 01/26/2021   NA 138 01/26/2021   K 4.6 01/26/2021   CL 103 01/26/2021   CO2 28 01/26/2021   GLUCOSE 70 01/26/2021   BUN 11 01/26/2021   CREATININE 0.78 01/26/2021   BILITOT 0.6 01/26/2021   ALKPHOS 62 01/26/2021   AST 31 01/26/2021   ALT 20 01/26/2021   PROT 6.8 01/26/2021   ALBUMIN 3.8 01/26/2021   CALCIUM 10.2 01/26/2021   GFRAA 90 06/16/2020    Speciality Comments: PLQ eye exam: 06/16/2021 WNL [facility] .  Follow up in 1 year.  Procedures:  No procedures performed Allergies: Ginger, Pork-derived products, and Shellfish allergy   PLQ eye exam: 06/16/2021 WNL [facility] . Follow up in 1 year. CBC and CMP drawn on 01/26/2021.  She is overdue to update lab work.  Orders for CBC and CMP were released. Assessment / Plan:     Visit Diagnoses: Autoimmune disease ([place]) - Positive ANA 1:80 nucleolar, positive RF, negative CCP, Raynauds, questionable history of vasculitis -she complains of increased discomfort in her joints.  No synovitis was noted.  Will obtain labs today.  She had crackles on auscultation of her bilateral lung bases.  I noted that she had recent chest x-ray which was unremarkable.  I will obtain high-resolution CT.  Plan: CBC with Differential/Platelet, COMPLETE METABOLIC PANEL WITH GFR, Urinalysis, Routine w reflex microscopic, ANA, C3 and C4, Anti-DNA antibody, double-stranded, Sedimentation rate, ENA and Jo 1 antibodies.  High risk medication use - Plaquenil 200 mg 1 tablet by mouth daily. PLQ eye exam: 06/16/2021 - Plan: CBC with Differential/Platelet, COMPLETE METABOLIC PANEL WITH GFR today.  Abnormal chest sounds-she had crackles in bilateral lung bases.  I will obtain high-resolution CT to evaluate this further.  History of total hip replacement, right - 2002 in [place].  She complains of increased discomfort.  She had good range of motion.  She has right quad muscle weakness.  History of total knee replacement, bilateral - by Dr. Lorin Mercy.  She had RTKR 2016 LTKR 2015.  She continues to have discomfort in her bilateral knee joints.  No warmth swelling or effusion was noted.  Primary osteoarthritis of both feet-proper fitting shoes were discussed.  DDD Lumbar spine with spinal stenosis-she continues to have lower back pain.  Osteopenia of multiple sites - 07/22/2016 DXA LFN -1.4, BMD 0.848. She will get repeat DEXA scan through her PCPs office.  Use of calcium rich diet and  vitamin D was discussed.  History of gastric bypass  Orders: Orders Placed This Encounter  Procedures   CT Chest High Resolution   CBC with Differential/Platelet   COMPLETE METABOLIC PANEL WITH GFR   Urinalysis, Routine w reflex  microscopic   ANA   C3 and C4   Anti-DNA antibody, double-stranded   Sedimentation rate   Anti-scleroderma antibody   Sjogrens syndrome-A extractable nuclear antibody   Sjogrens syndrome-B extractable nuclear antibody   Anti-Smith antibody   RNP Antibody   Jo-1 antibody-IgG   No orders of the defined types were placed in this encounter.    Follow-Up Instructions: Return in 3 months (on 10/06/2021) for Autoimmune Disease, Osteoarthritis.   Bo Merino, MD  Note - This record has been created using Editor, commissioning.  Chart creation errors have been sought, but may not always  have been located. Such creation errors do not reflect on  the standard of medical care.

## 2021-07-04 ENCOUNTER — Other Ambulatory Visit: Payer: Self-pay | Admitting: Physician Assistant

## 2021-07-05 NOTE — Telephone Encounter (Signed)
Next Visit: 07/06/2021 ?  ?Last Visit: 02/09/2021 ?  ?Labs: 01/26/2021 WNL ?  ?Eye exam: 06/16/2021 WNL  ?  ?Current Dose per office note 02/09/2021: Plaquenil 200 mg 1 tablet by mouth daily ?  ?[id number] disease  ?  ?Last Fill: 04/05/2021 ?  ?Okay to refill Plaquenil?  ?

## 2021-07-06 ENCOUNTER — Ambulatory Visit: Payer: PRIVATE HEALTH INSURANCE | Admitting: Rheumatology

## 2021-07-06 ENCOUNTER — Encounter: Payer: Self-pay | Admitting: Physician Assistant

## 2021-07-06 ENCOUNTER — Ambulatory Visit (INDEPENDENT_AMBULATORY_CARE_PROVIDER_SITE_OTHER): Payer: Medicare Other | Admitting: Rheumatology

## 2021-07-06 ENCOUNTER — Other Ambulatory Visit: Payer: Self-pay

## 2021-07-06 VITALS — BP 129/76 | HR 73 | Ht 62.0 in | Wt 186.0 lb

## 2021-07-06 DIAGNOSIS — M47816 Spondylosis without myelopathy or radiculopathy, lumbar region: Secondary | ICD-10-CM | POA: Diagnosis not present

## 2021-07-06 DIAGNOSIS — M19072 Primary osteoarthritis, left ankle and foot: Secondary | ICD-10-CM

## 2021-07-06 DIAGNOSIS — M359 Systemic involvement of connective tissue, unspecified: Secondary | ICD-10-CM | POA: Diagnosis not present

## 2021-07-06 DIAGNOSIS — M8589 Other specified disorders of bone density and structure, multiple sites: Secondary | ICD-10-CM | POA: Diagnosis not present

## 2021-07-06 DIAGNOSIS — R0989 Other specified symptoms and signs involving the circulatory and respiratory systems: Secondary | ICD-10-CM

## 2021-07-06 DIAGNOSIS — Z96653 Presence of artificial knee joint, bilateral: Secondary | ICD-10-CM

## 2021-07-06 DIAGNOSIS — R591 Generalized enlarged lymph nodes: Secondary | ICD-10-CM

## 2021-07-06 DIAGNOSIS — Z79899 Other long term (current) drug therapy: Secondary | ICD-10-CM | POA: Diagnosis not present

## 2021-07-06 DIAGNOSIS — M19071 Primary osteoarthritis, right ankle and foot: Secondary | ICD-10-CM | POA: Diagnosis not present

## 2021-07-06 DIAGNOSIS — Z96641 Presence of right artificial hip joint: Secondary | ICD-10-CM | POA: Diagnosis not present

## 2021-07-06 DIAGNOSIS — Z9884 Bariatric surgery status: Secondary | ICD-10-CM | POA: Diagnosis not present

## 2021-07-07 NOTE — Progress Notes (Signed)
Hgb and hct are low. Please forward results to PCP-she may need a further workup for anemia. Rest of CBC WNL. CMP WNL.  Complements and ESR WNL. UA revealed 1+ leukocytes.  Negative for nitrites and bacteria.  If she develops symptoms of a UTI she should follow up with her PCP for further evaluation.

## 2021-07-08 LAB — COMPLETE METABOLIC PANEL WITH GFR
AG Ratio: 1.8 (calc) (ref 1.0–2.5)
ALT: 18 U/L (ref 6–29)
AST: 29 U/L (ref 10–35)
Albumin: 4.2 g/dL (ref 3.6–5.1)
Alkaline phosphatase (APISO): 66 U/L (ref 37–153)
BUN: 15 mg/dL (ref 7–25)
CO2: 33 mmol/L — ABNORMAL HIGH (ref 20–32)
Calcium: 10.3 mg/dL (ref 8.6–10.4)
Chloride: 101 mmol/L (ref 98–110)
Creat: 0.78 mg/dL (ref 0.60–1.00)
Globulin: 2.4 g/dL (calc) (ref 1.9–3.7)
Glucose, Bld: 79 mg/dL (ref 65–99)
Potassium: 4.7 mmol/L (ref 3.5–5.3)
Sodium: 138 mmol/L (ref 135–146)
Total Bilirubin: 0.4 mg/dL (ref 0.2–1.2)
Total Protein: 6.6 g/dL (ref 6.1–8.1)
eGFR: 82 mL/min/{1.73_m2} (ref >=60)

## 2021-07-08 LAB — CBC WITH DIFFERENTIAL/PLATELET
Absolute Monocytes: 522 cells/uL (ref 200–950)
Basophils Absolute: 52 cells/uL (ref 0–200)
Basophils Relative: 0.9 %
Eosinophils Absolute: 261 cells/uL (ref 15–500)
Eosinophils Relative: 4.5 %
HCT: 34.1 % — ABNORMAL LOW (ref 35.0–45.0)
Hemoglobin: 10.9 g/dL — ABNORMAL LOW (ref 11.7–15.5)
Lymphs Abs: 1502 cells/uL (ref 850–3900)
MCH: 28 pg (ref 27.0–33.0)
MCHC: 32 g/dL (ref 32.0–36.0)
MCV: 87.7 fL (ref 80.0–100.0)
MPV: 10.3 fL (ref 7.5–12.5)
Monocytes Relative: 9 %
Neutro Abs: 3463 cells/uL (ref 1500–7800)
Neutrophils Relative %: 59.7 %
Platelets: 303 10*3/uL (ref 140–400)
RBC: 3.89 10*6/uL (ref 3.80–5.10)
RDW: 13.2 % (ref 11.0–15.0)
Total Lymphocyte: 25.9 %
WBC: 5.8 10*3/uL (ref 3.8–10.8)

## 2021-07-08 LAB — JO-1 ANTIBODY-IGG: Jo-1 Autoabs: <1 AI

## 2021-07-08 LAB — ANTI-NUCLEAR AB-TITER (ANA TITER)
ANA TITER: 1:1280 {titer} — ABNORMAL HIGH
ANA TITER: 1:320 {titer} — ABNORMAL HIGH
ANA Titer 1: 1:1280 {titer} — ABNORMAL HIGH

## 2021-07-08 LAB — URINALYSIS, ROUTINE W REFLEX MICROSCOPIC
Bacteria, UA: NONE SEEN /HPF
Bilirubin Urine: NEGATIVE
Glucose, UA: NEGATIVE
Hgb urine dipstick: NEGATIVE
Hyaline Cast: NONE SEEN /LPF
Ketones, ur: NEGATIVE
Nitrite: NEGATIVE
Protein, ur: NEGATIVE
RBC / HPF: NONE SEEN /HPF (ref 0–2)
Specific Gravity, Urine: 1.017 (ref 1.001–1.035)
Squamous Epithelial / HPF: NONE SEEN /HPF (ref <=5)
pH: 5.5 (ref 5.0–8.0)

## 2021-07-08 LAB — MICROSCOPIC MESSAGE

## 2021-07-08 LAB — C3 AND C4
C3 Complement: 121 mg/dL (ref 83–193)
C4 Complement: 26 mg/dL (ref 15–57)

## 2021-07-08 LAB — ANTI-SCLERODERMA ANTIBODY: Scleroderma (Scl-70) (ENA) Antibody, IgG: <1 AI

## 2021-07-08 LAB — SJOGRENS SYNDROME-A EXTRACTABLE NUCLEAR ANTIBODY: SSA (Ro) (ENA) Antibody, IgG: <1 AI

## 2021-07-08 LAB — ANA: Anti Nuclear Antibody (ANA): POSITIVE — AB

## 2021-07-08 LAB — ANTI-SMITH ANTIBODY: ENA SM Ab Ser-aCnc: <1 AI

## 2021-07-08 LAB — ANTI-DNA ANTIBODY, DOUBLE-STRANDED: ds DNA Ab: 1 IU/mL

## 2021-07-08 LAB — SEDIMENTATION RATE: Sed Rate: 11 mm/h (ref 0–30)

## 2021-07-08 LAB — RNP ANTIBODY: Ribonucleic Protein(ENA) Antibody, IgG: <1 AI

## 2021-07-08 LAB — SJOGRENS SYNDROME-B EXTRACTABLE NUCLEAR ANTIBODY: SSB (La) (ENA) Antibody, IgG: <1 AI

## 2021-07-08 NOTE — Progress Notes (Signed)
Ro antibody, La antibody, RNP, Smith, scl-70, dsDNA, and Jo-1 antibodies are negative.

## 2021-07-13 DIAGNOSIS — D649 Anemia, unspecified: Secondary | ICD-10-CM | POA: Diagnosis not present

## 2021-07-13 DIAGNOSIS — Z79899 Other long term (current) drug therapy: Secondary | ICD-10-CM | POA: Diagnosis not present

## 2021-07-16 ENCOUNTER — Ambulatory Visit (HOSPITAL_COMMUNITY)
Admission: RE | Admit: 2021-07-16 | Discharge: 2021-07-16 | Disposition: A | Discharge status: Home / Self Care | Payer: Medicare Other | Source: Ambulatory Visit | Attending: Rheumatology | Admitting: Rheumatology

## 2021-07-16 ENCOUNTER — Other Ambulatory Visit: Payer: Self-pay

## 2021-07-16 DIAGNOSIS — I700 Atherosclerosis of aorta: Secondary | ICD-10-CM | POA: Diagnosis not present

## 2021-07-16 DIAGNOSIS — D649 Anemia, unspecified: Secondary | ICD-10-CM | POA: Diagnosis not present

## 2021-07-16 DIAGNOSIS — R0989 Other specified symptoms and signs involving the circulatory and respiratory systems: Secondary | ICD-10-CM | POA: Insufficient documentation

## 2021-07-16 DIAGNOSIS — D899 Disorder involving the immune mechanism, unspecified: Secondary | ICD-10-CM | POA: Diagnosis not present

## 2021-07-16 DIAGNOSIS — E669 Obesity, unspecified: Secondary | ICD-10-CM | POA: Diagnosis not present

## 2021-07-16 DIAGNOSIS — M359 Systemic involvement of connective tissue, unspecified: Secondary | ICD-10-CM | POA: Insufficient documentation

## 2021-07-16 DIAGNOSIS — J439 Emphysema, unspecified: Secondary | ICD-10-CM | POA: Diagnosis not present

## 2021-07-16 DIAGNOSIS — Z6833 Body mass index (BMI) 33.0-33.9, adult: Secondary | ICD-10-CM | POA: Diagnosis not present

## 2021-07-16 DIAGNOSIS — S2241XA Multiple fractures of ribs, right side, initial encounter for closed fracture: Secondary | ICD-10-CM | POA: Diagnosis not present

## 2021-07-21 ENCOUNTER — Telehealth: Payer: Self-pay

## 2021-07-21 DIAGNOSIS — M359 Systemic involvement of connective tissue, unspecified: Secondary | ICD-10-CM

## 2021-07-21 DIAGNOSIS — J849 Interstitial pulmonary disease, unspecified: Secondary | ICD-10-CM

## 2021-07-21 NOTE — Telephone Encounter (Signed)
Patient called requesting a return call to discuss the results of her CT scan and how to proceed.   ?

## 2021-07-21 NOTE — Progress Notes (Signed)
?  I had a detailed discussion with the patient regarding the CT scan findings.  We already referred patient to [facility] pulmonary for evaluation of ILD.  She has mediastinal lymph nodes and axillary lymph nodes.  I will await pulmonary evaluation.  We will refer her to Dr. Gala Romney for evaluation of pulmonary hypertension.  At this point she is only on hydroxychloroquine as pulmonary crackles was an incidental finding.  After her pulmonary evaluation we can put her on more aggressive therapy.

## 2021-07-21 NOTE — Telephone Encounter (Signed)
Referral placed.

## 2021-07-21 NOTE — Telephone Encounter (Signed)
I discussed CT scan findings with the patient.  Please refer patient to ILD clinic.

## 2021-07-22 ENCOUNTER — Telehealth: Payer: Self-pay | Admitting: *Deleted

## 2021-07-22 DIAGNOSIS — I272 Pulmonary hypertension, unspecified: Secondary | ICD-10-CM

## 2021-07-22 DIAGNOSIS — R0989 Other specified symptoms and signs involving the circulatory and respiratory systems: Secondary | ICD-10-CM

## 2021-07-22 NOTE — Telephone Encounter (Signed)
Cardiology office called stating they will see the patient on August 20, 2021. Per referring office you will need to place order for echo and we will need to get it precerted. Please advise.  ?

## 2021-07-22 NOTE — Telephone Encounter (Signed)
-----   Message from Bo Merino, MD sent at 07/21/2021  5:00 PM EDT ----- ? ?I had a detailed discussion with the patient regarding the CT scan findings.  We already referred patient to [facility] pulmonary for evaluation of ILD.  She has mediastinal lymph nodes and axillary lymph nodes.  I will await pulmonary evaluation.  We w ?ill refer her to Dr. Haroldine Laws for evaluation of pulmonary hypertension.  At this point she is only on hydroxychloroquine as pulmonary crackles was an incidental finding.  After her pulmonary evaluation we can put her on more aggressive therapy. ?

## 2021-07-22 NOTE — Telephone Encounter (Signed)
Please refer patient to Dr. Prescott Gum office for evaluation.  They will determine if echocardiogram is needed and order it.

## 2021-07-23 NOTE — Telephone Encounter (Signed)
Left message for Dr. Prescott Gum office to advise them that their office would need to order the echo and to have that precerted. Advised we are unable to do so.  ?

## 2021-08-13 ENCOUNTER — Ambulatory Visit (INDEPENDENT_AMBULATORY_CARE_PROVIDER_SITE_OTHER): Payer: Medicare Other | Admitting: Nurse Practitioner

## 2021-08-13 VITALS — BP 132/76 | HR 69 | Temp 98.1°F | Ht 66.0 in | Wt 178.0 lb

## 2021-08-13 DIAGNOSIS — M7989 Other specified soft tissue disorders: Secondary | ICD-10-CM | POA: Insufficient documentation

## 2021-08-13 DIAGNOSIS — D649 Anemia, unspecified: Secondary | ICD-10-CM

## 2021-08-13 LAB — CBC
HCT: 35.7 % — ABNORMAL LOW (ref 36.0–46.0)
Hemoglobin: 11.5 g/dL — ABNORMAL LOW (ref 12.0–15.0)
MCHC: 32.2 g/dL (ref 30.0–36.0)
MCV: 87.2 fl (ref 78.0–100.0)
Platelets: 280 10*3/uL (ref 150.0–400.0)
RBC: 4.09 Mil/uL (ref 3.87–5.11)
RDW: 14.2 % (ref 11.5–15.5)
WBC: 5.3 10*3/uL (ref 4.0–10.5)

## 2021-08-13 LAB — FERRITIN: Ferritin: 13.7 ng/mL (ref 10.0–291.0)

## 2021-08-13 LAB — IRON: Iron: 58 ug/dL (ref 42–145)

## 2021-08-13 NOTE — Assessment & Plan Note (Signed)
We will recheck CBC as well as iron and ferritin levels.  Further recommendations may be made based upon these results. ?

## 2021-08-13 NOTE — Progress Notes (Signed)
? ? ? ?Subjective:  ?Patient ID: Chelsea Callahan, female    DOB: [birth year]  Age: 71 y.o. MRN: [id number] ? ?CC:  ?Chief Complaint  ?Patient presents with  ? New patient  ?  Patient had a CT and has a lot of concerns about it  ?  ? ? ?HPI  ?This patient arrives today for the above. ? ?She arrives today to establish care here for her primary care needs. ? ?She tells me that overall she feels great.  She follows with rheumatology for autoimmune disease.  She had CT scan completed about 3 weeks ago because she was noted to have pulmonary crackles on exam with rheumatologist.  CT scan showed evidence of interstitial lung disease, lingular nodule approximately 5 mm in diameter, small to borderline enlarged lymph nodes, and enlarged pulmonary arteries possibly indicative of pulmonary hypertension.  Her rheumatologist referred patient to pulmonology for further evaluation as well as to cardiology for evaluation of pulmonary hypertension.  She denies any symptoms and reports she is not experiencing shortness of breath or chest pain.  She does note some leg swelling especially to the right lower extremity.  This has been present for at least a month and has been stable, she is experiencing some pain in that right leg that only occurs at rest when she is either sitting down or laying down on her right side.  When she lays down on her back she is not experiencing any pain.  She rates it at a 5/10 in intensity.  She does have tingling in her right foot when exposed to cold, this is chronic and associated with her history of Raynaud's.  She has no pain with walking.  She tells me that her swelling will improve with elevation of her extremity.  She has had lumbar laminectomy (L4-5) with decompression and microdiscectomy back in 2018. ? ?She also has a history of anemia, and used to take iron supplements but was told to stop this due to elevated iron levels.  She ended up restarting this recently.  Last hemoglobin was 10.9, and was  collected about 6 weeks ago. ? ?She also mentions that she had her cholesterol checked within the last month at previous provider's office.  She tells me she was told it was slightly elevated but has been making some lifestyle changes aimed at reducing her cholesterol, specifically reducing her intake of eggs.  Unfortunately I do not have access to these records to review today. ? ?Past Medical History:  ?Diagnosis Date  ? Anemia   ? 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HCC)   ? Blood transfusion without reported diagnosis   ? Cataract   ? bil/ per pt  ? GERD (gastroesophageal reflux disease)   ? Primary osteoarthritis of right knee   ? Raynaud disease   ? Raynaud disease   ? Spinal headache   ? c-section  ? Vasculitis ([place])   ? on prednisone -   ? ? ? ? ?Family History  ?Problem Relation Age of Onset  ? Cancer Brother   ? Multiple myeloma Brother   ? Colon cancer Neg Hx   ? Esophageal cancer Neg Hx   ? Rectal cancer Neg Hx   ? Stomach cancer Neg Hx   ? ? ?Social History  ? ?Social History Narrative  ? Not on file  ? ?Social History  ? ?Tobacco Use  ? Smoking status: Former  ?  Packs/day: 0.25  ?  Years: 15.00  ?  Pack years: 3.75  ?  Types: Cigarettes  ?  Quit date: 1999  ?  Years since quitting: 24.2  ?  Passive exposure: Past  ? Smokeless tobacco: Never  ?Substance Use Topics  ? Alcohol use: Yes  ?  Alcohol/week: 4.0 - 6.0 standard drinks  ?  Types: 4 - 6 Glasses of wine per week  ?  Comment: weekly   ? ? ? ?Current Meds  ?Medication Sig  ? ASPIRIN 81 PO Take by mouth daily.  ? Fe Bisgly-Succ-C-Thre-B12-FA (IRON-150 PO) Take daily by mouth.  ? hydroxychloroquine (PLAQUENIL) 200 MG tablet TAKE 1 TABLET BY MOUTH EVERY DAY  ? Multiple Vitamin (MULTIVITAMIN WITH MINERALS) TABS tablet Take 1 tablet by mouth daily.  ? ?Current Facility-Administered Medications for the 08/13/21 encounter (Office Visit) with Ailene Ards, NP  ?Medication  ? 0.9 %  sodium chloride infusion  ? ? ?ROS:  ?Review of Systems  ?Constitutional:  Negative  for malaise/fatigue and weight loss.  ?Respiratory:  Negative for shortness of breath.   ?Cardiovascular:  Positive for leg swelling. Negative for chest pain.  ?Gastrointestinal:  Negative for abdominal pain, blood in stool and diarrhea.  ?     (-) worsening bowel incontinence; improved with metamucil  ?Genitourinary:   ?     (-) urinary incontinence  ?Neurological:  Positive for tingling (chronic, triggered by cold; not associated with pain). Negative for weakness.  ? ? ?Objective:  ? ?Today's Vitals: BP 132/76 (BP Location: Right Arm, Patient Position: Sitting, Cuff Size: Normal)   Pulse 69   Temp 98.1 ?F (36.7 ?C) (Oral)   Ht $R'5\' 6"'Ba$  (1.676 m)   Wt 178 lb (80.7 kg)   SpO2 99%   BMI 28.73 kg/m?  ? ?  08/13/2021  ?  1:06 PM 07/06/2021  ?  2:34 PM 02/09/2021  ?  2:43 PM  ?Vitals with BMI  ?Height $Remove'5\' 6"'gNDIXyR$  $RemoveB'5\' 2"'BbqCltZd$  $RemoveBe'5\' 2"'phfqvbUYe$   ?Weight 178 lbs 186 lbs 182 lbs  ?BMI 28.74 34.01 33.28  ?Systolic 937 902 409  ?Diastolic 76 76 78  ?Pulse 69 73 69  ?  ? ?Physical Exam ?Vitals reviewed.  ?Constitutional:   ?   General: She is not in acute distress. ?   Appearance: Normal appearance.  ?HENT:  ?   Head: Normocephalic and atraumatic.  ?Neck:  ?   Vascular: No carotid bruit.  ?Cardiovascular:  ?   Rate and Rhythm: Normal rate and regular rhythm.  ?   Pulses: Normal pulses.  ?   Heart sounds: Normal heart sounds.  ?Pulmonary:  ?   Effort: Pulmonary effort is normal.  ?   Breath sounds: Examination of the right-lower field reveals rales. Examination of the left-lower field reveals rales. Rales present.  ?Musculoskeletal:  ?   Right lower leg: Edema (nonpitting) present.  ?Skin: ?   General: Skin is warm and dry.  ?Neurological:  ?   General: No focal deficit present.  ?   Mental Status: She is alert and oriented to person, place, and time.  ?Psychiatric:     ?   Mood and Affect: Mood normal.     ?   Behavior: Behavior normal.     ?   Judgment: Judgment normal.  ? ? ? ? ? ? ? ?Assessment and Plan  ? ?1. Anemia, unspecified type   ?2. Right  leg swelling   ? ? ? ?Plan: ?See plan via problem list below. ? ?Of note, I did provide her reassurance today as she generally remains asymptomatic despite  abnormal findings on her CT scan.  She was encouraged to keep her appointments with pulmonology and cardiology for further evaluation.  She reports her understanding and plans to do so. ? ? ?Tests ordered ?Orders Placed This Encounter  ?Procedures  ? CBC  ? Iron  ? Ferritin  ? VAS [place] LOWER EXTREMITY VENOUS (DVT)  ? ? ? ? ?No orders of the defined types were placed in this encounter. ? ? ?Patient to follow-up in 2 months, or sooner as needed.  May consider rechecking lipid panel at next office visit. ? ?Ailene Ards, NP ? ?

## 2021-08-13 NOTE — Assessment & Plan Note (Signed)
Stable, sounds like this could be sciatica.  However, will order ultrasound to rule out DVT.  Further recommendations may be made based upon these results. ?

## 2021-08-16 ENCOUNTER — Ambulatory Visit (HOSPITAL_COMMUNITY)
Admission: RE | Admit: 2021-08-16 | Discharge: 2021-08-16 | Disposition: A | Discharge status: Home / Self Care | Payer: Medicare Other | Source: Ambulatory Visit | Attending: Cardiology | Admitting: Cardiology

## 2021-08-16 DIAGNOSIS — M7989 Other specified soft tissue disorders: Secondary | ICD-10-CM | POA: Diagnosis not present

## 2021-08-20 ENCOUNTER — Encounter (HOSPITAL_COMMUNITY): Payer: PRIVATE HEALTH INSURANCE | Admitting: Internal Medicine

## 2021-08-24 ENCOUNTER — Ambulatory Visit (HOSPITAL_COMMUNITY)
Admission: RE | Admit: 2021-08-24 | Discharge: 2021-08-24 | Disposition: A | Discharge status: Home / Self Care | Payer: Medicare Other | Source: Ambulatory Visit | Attending: Internal Medicine | Admitting: Internal Medicine

## 2021-08-24 VITALS — BP 136/78 | HR 78 | Ht 60.0 in | Wt 184.0 lb

## 2021-08-24 DIAGNOSIS — Z87891 Personal history of nicotine dependence: Secondary | ICD-10-CM | POA: Insufficient documentation

## 2021-08-24 DIAGNOSIS — R0602 Shortness of breath: Secondary | ICD-10-CM | POA: Insufficient documentation

## 2021-08-24 DIAGNOSIS — I272 Pulmonary hypertension, unspecified: Secondary | ICD-10-CM | POA: Diagnosis not present

## 2021-08-24 DIAGNOSIS — R0989 Other specified symptoms and signs involving the circulatory and respiratory systems: Secondary | ICD-10-CM | POA: Diagnosis not present

## 2021-08-24 DIAGNOSIS — J849 Interstitial pulmonary disease, unspecified: Secondary | ICD-10-CM | POA: Diagnosis not present

## 2021-08-24 DIAGNOSIS — E669 Obesity, unspecified: Secondary | ICD-10-CM | POA: Diagnosis not present

## 2021-08-24 DIAGNOSIS — R600 Localized edema: Secondary | ICD-10-CM | POA: Insufficient documentation

## 2021-08-24 NOTE — Progress Notes (Addendum)
? ?ADVANCED HF CLINIC CONSULT NOTE ? ?Referring Physician: Dr. Bo Merino ?Primary Care: Ailene Ards, NP ?Primary Cardiologist: New ? ?HPI: ? ?Chelsea Callahan is a 71 y.o. obese female with history of autoimmune disease (positive ANA 1:1,280)and osteoarthritis. She has intermittent symptoms of raynaud's but denies any digital ulcerations. She has been followed by Dr. Estanislado Pandy. ?  ?Had hi-res CT chest 3/23 suggestive of ILD with enlarged pulmonary arteries. Esophagus normal  ? ?Denies any h/o known heart disease. Active walks 26mles 4x/week and does aerobics. Mildly SOB at the beginning then warms up and feels good. Mild edema in right leg which started after hip replacement. No orthopnea or PND. No snoring. Had recent LE u/s no DVT. ? ?Was a "light" smoker (6-7 cigs/day) for 15 years quit 1999. No h/o DVT or PE. No diet drugs  ? ? ?Review of Systems: [y] = yes, [ ] = no  ? ?General: Weight gain [ ]; Weight loss [ ]; Anorexia [ ]; Fatigue [ ]; Fever [ ]; Chills [ ]; Weakness [ ]  ?Cardiac: Chest pain/pressure [ ]; Resting SOB [ ]; Exertional SOB [ ]; Orthopnea [ ]; Pedal Edema [ y]; Palpitations [ ]; Syncope [ ]; Presyncope [ ]; Paroxysmal nocturnal dyspnea[ ]  ?Pulmonary: Cough [ ]; Wheezing[ ]; Hemoptysis[ ]; Sputum [ ]; Snoring [ ]  ?GI: Vomiting[ ]; Dysphagia[ ]; Melena[ ]; Hematochezia [ ]; Heartburn[ ]; Abdominal pain [ ]; Constipation [ ]; Diarrhea [ ]; BRBPR [ ]  ?GU: Hematuria[ ]; Dysuria [ ]; Nocturia[ ]  ?Vascular: Pain in legs with walking [ ]; Pain in feet with lying flat [ ]; Non-healing sores [ ]; Stroke [ ]; TIA [ ]; Slurred speech [ ];  ?Neuro: Headaches[ ]; Vertigo[ ]; Seizures[ ]; Paresthesias[ ];Blurred vision [ ]; Diplopia [ ]; Vision changes [ ]  ?Ortho/Skin: Arthritis [Blue.Reese]; Joint pain [Blue.Reese]; Muscle pain [ ]; Joint swelling [ ]; Back Pain [ ]; Rash [ ]  ?Psych: Depression[ ]; Anxiety[ ]  ?Heme: Bleeding problems [ ]; Clotting disorders [ ]; Anemia [ ]  ?Endocrine: Diabetes [ ]; Thyroid  dysfunction[ ] ? ? ?Past Medical History:  ?Diagnosis Date  ? Anemia   ? 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HCC)   ? Blood transfusion without reported diagnosis   ? Cataract   ? bil/ per pt  ? GERD (gastroesophageal reflux disease)   ? Primary osteoarthritis of right knee   ? Raynaud disease   ? Raynaud disease   ? Spinal headache   ? c-section  ? Vasculitis ([place]   ? on prednisone -   ? ? ?Current Outpatient Medications  ?Medication Sig Dispense Refill  ? ASPIRIN 81 PO Take by mouth daily.    ? Black Pepper-Turmeric (TURMERIC CURCUMIN) 08-998 MG CAPS Take 1,000 mg by mouth daily.    ? Cholecalciferol ([employer] D3) 25 MCG (1000 UT) capsule Take 1,000 Units by mouth daily.    ? Fe Bisgly-Succ-C-Thre-B12-FA (IRON-150 PO) Take daily by mouth.    ? hydroxychloroquine (PLAQUENIL) 200 MG tablet TAKE 1 TABLET BY MOUTH EVERY DAY 90 tablet 0  ? Multiple Vitamin (MULTIVITAMIN WITH MINERALS) TABS tablet Take 1 tablet by mouth daily.    ? psyllium (METAMUCIL) 58.6 % powder Take 1 packet by mouth in the morning and at bedtime.    ? ?Current Facility-Administered Medications  ?Medication Dose Route Frequency Provider Last Rate Last Admin  ? 0.9 %  sodium chloride infusion  500 mL Intravenous Once  Yetta Flock, MD      ? ? ?Allergies  ?Allergen Reactions  ? Ginger Swelling  ?  Stomach swells  ? Pork-Derived Products Other (See Comments)  ?  UNSPECIFIED "PERSONAL REASONS"  ? Shellfish Allergy Other (See Comments)  ?  UNSPECIFIED "Personal reasons/dietary"  ? ? ?  ?Social History  ? ?Socioeconomic History  ? Marital status: Married  ?  Spouse name: Not on file  ? Number of children: Not on file  ? Years of education: Not on file  ? Highest education level: Not on file  ?Occupational History  ? Not on file  ?Tobacco Use  ? Smoking status: Former  ?  Packs/day: 0.25  ?  Years: 15.00  ?  Pack years: 3.75  ?  Types: Cigarettes  ?  Quit date: 1999  ?  Years since quitting: 24.3  ?  Passive exposure: Past  ? Smokeless tobacco: Never  ?Vaping  Use  ? Vaping Use: Never used  ?Substance and Sexual Activity  ? Alcohol use: Yes  ?  Alcohol/week: 4.0 - 6.0 standard drinks  ?  Types: 4 - 6 Glasses of wine per week  ?  Comment: weekly   ? Drug use: No  ? Sexual activity: Not on file  ?Other Topics Concern  ? Not on file  ?Social History Narrative  ? Not on file  ? ?Social Determinants of Health  ? ?Financial Resource Strain: Not on file  ?Food Insecurity: Not on file  ?Transportation Needs: Not on file  ?Physical Activity: Not on file  ?Stress: Not on file  ?Social Connections: Not on file  ?Intimate Partner Violence: Not on file  ? ? ?  ?Family History  ?Problem Relation Age of Onset  ? Cancer Brother   ? Multiple myeloma Brother   ? Colon cancer Neg Hx   ? Esophageal cancer Neg Hx   ? Rectal cancer Neg Hx   ? Stomach cancer Neg Hx   ? ? ?Vitals:  ? 08/24/21 1408  ?BP: 136/78  ?Pulse: 78  ?SpO2: 97%  ?Weight: 83.5 kg (184 lb)  ?Height: 5' (1.524 m)  ? ? ?PHYSICAL EXAM: ?General:  Well appearing. No respiratory difficulty ?HEENT: normal ?Neck: supple. no JVD. Carotids 2+ bilat; no bruits. No lymphadenopathy or thryomegaly appreciated. ?Cor: PMI nondisplaced. Regular rate & rhythm. No rubs, gallops or murmurs. ?Lungs: clear ?Abdomen:obese soft, nontender, nondistended. No hepatosplenomegaly. No bruits or masses. Good bowel sounds. ?Extremities: no cyanosis, clubbing, rash, edema ?Neuro: alert & oriented x 3, cranial nerves grossly intact. moves all 4 extremities w/o difficulty. Affect pleasant. ?  ?ECG: NSR 1 PVC. Normal axis and intervals Personally reviewed ? ? ?Hall walk: sats 89-99% ? ?ASSESSMENT & PLAN:  ? ?1. 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 Chase Caller ? ?2. ILD ?- has f/u with Dr.  Chase Caller ? ?Glori Bickers, MD  ?2:51 PM ? ?

## 2021-08-24 NOTE — Patient Instructions (Signed)
Medication Changes: ? ?none ? ?Lab Work: ? ?none ? ?Testing/Procedures: ? ?Your physician has requested that you have an echocardiogram. Echocardiography is a painless test that uses sound waves to create images of your heart. It provides your doctor with information about the size and shape of your heart and how well your heart?s chambers and valves are working. This procedure takes approximately one hour. There are no restrictions for this procedure.  ? ?Your provider has recommended that you have a home sleep study.  We have provided you with the equipment in our office today. Please download the app and follow the instructions. YOUR PIN NUMBER IS: [id number]. Once you have completed the test you just dispose of the equipment, the information is automatically uploaded to [place] via blue-tooth technology. If your test is positive for sleep apnea and you need a home CPAP machine you will be contacted by Dr Theodosia Blender office [facility]) to set this up. ? ?Referrals: ? ?NONE ? ?Special Instructions // Education: ? ?Do the following things EVERYDAY: ?Weigh yourself in the morning before breakfast. Write it down and keep it in a log. ?Take your medicines as prescribed ?Eat low salt foods--Limit salt (sodium) to 2000 mg per day.  ?Stay as active as you can everyday ?Limit all fluids for the day to less than 2 liters ? ?Follow-Up in: 2 months ? ?At the [facility], you and your health needs are our priority. We have a designated team specialized in the treatment of Heart Failure. This Care Team includes your primary Heart Failure Specialized Cardiologist (physician), Advanced Practice Providers (APPs- Physician Assistants and Nurse Practitioners), and Pharmacist who all work together to provide you with the care you need, when you need it.  ? ?You may see any of the following providers on your designated Care Team at your next follow up: ? ?Dr Glori Bickers ?Dr Loralie Champagne ?Darrick Grinder, NP ?Lyda Jester, PA ?Jessica Milford,NP ?Marlyce Huge, PA ?Audry Riles, PharmD ? ? ?Please be sure to bring in all your medications bottles to every appointment.  ? ?Need to Contact us: ? ?If you have any questions or concerns before your next appointment please send [place] a message through [place] or call our office at [phone].   ? ?TO LEAVE A MESSAGE FOR THE NURSE SELECT OPTION 2, PLEASE LEAVE A MESSAGE INCLUDING: ?YOUR NAME ?DATE OF BIRTH ?CALL BACK NUMBER ?REASON FOR CALL**this is important as we prioritize the call backs ? ?YOU WILL RECEIVE A CALL BACK THE SAME DAY AS LONG AS YOU CALL BEFORE 4:00 PM ? ? ?

## 2021-08-24 NOTE — Progress Notes (Signed)
Pt ambulated in hallway. O2 sats ranged 97-100% on RA, tolerated well with no complaints  ? ?Ozella Rocks P  ?

## 2021-08-26 ENCOUNTER — Encounter: Payer: Self-pay | Admitting: Internal Medicine

## 2021-08-26 ENCOUNTER — Ambulatory Visit (INDEPENDENT_AMBULATORY_CARE_PROVIDER_SITE_OTHER): Payer: Medicare Other | Admitting: Internal Medicine

## 2021-08-26 VITALS — BP 122/74 | HR 83 | Ht 60.0 in | Wt 182.2 lb

## 2021-08-26 DIAGNOSIS — J849 Interstitial pulmonary disease, unspecified: Secondary | ICD-10-CM | POA: Diagnosis not present

## 2021-08-26 DIAGNOSIS — R768 Other specified abnormal immunological findings in serum: Secondary | ICD-10-CM | POA: Diagnosis not present

## 2021-08-26 DIAGNOSIS — I7300 Raynaud's syndrome without gangrene: Secondary | ICD-10-CM

## 2021-08-26 NOTE — Patient Instructions (Addendum)
ICD-10-CM   ?1. ILD (interstitial lung disease) (HCC)  J84.9   ?  ?2. ANA positive  R76.8   ?  ?3. Raynaud's disease without gangrene  I73.00   ?  ? ? 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 ?Plan ? - to narrow possibilities further ACE, Myosits panel, CK, Aldolase, HP panel, QUantiferon gold blood work 08/26/2021 ?- to prepare for future potential treatments do - do G6PD, TPMT test,  08/26/2021 ?- do full PFT next few weeks ? - do simple walk test at next visit ? - Broadly speaking we discussed 3 types of treatment approaches ? -Immunosuppressive treatment with Imuran ? -Antifibrotic treatment with nintedanib or a medication called pirfenidone ? 1-watchful waiting ? -Currently leaning towards antifibrotic therapies ?-Visit [email or web address] for any and general learning ? ?Follow-up ?- Next few to several weeks to see Dr. Marchelle Gearing to review test results and to discuss next steps in treatment ? ?

## 2021-08-26 NOTE — Progress Notes (Addendum)
OV 08/26/2021 - new ILD [facility]. Referred by Dr Pollyann Savoy  Subjective:  Patient ID: Chelsea Callahan, female , DOB: May 10, 1950 , age 71 y.o. , MRN: [id number] , ADDRESS: [address] [place] [address] PCP Elenore Paddy, NP Patient Care Team: Elenore Paddy, NP as PCP - General (Nurse Practitioner)  This Provider for this visit: Treatment Team:  Attending Provider: Kalman Shan, MD    08/26/2021 -   Chief Complaint  Patient presents with   Consult    Referred for possible ILD     HPI Chelsea Callahan 71 y.o. -referred by Dr Naida Sleight.  71 year old female.  She used to live in [place] and [place] and.  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  Then in 2013 she relocated to [facility] along with her husband.  This is for her retirement.  She tells me that approximately 13 years ago she developed Raynaud's symptoms.  She also started having some alopecia may be a year or 2 before that.  This then resulted in a positive ANA testing.  She then started following with a rheumatologist for 5 years before she relocated to [facility] and for the last 8 or 9 years has established care with Dr. Corliss Skains.  Her ANA here is also positive [see below].  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  She is followed periodically.  She is on Plaquenil.  She has arthritis but this is osteoarthritis.  She denies any scleroderma findings or rheumatoid arthritis findings of malar rash or oral ulcers or hematuria or other connective tissue disease findings beyond the Raynaud's.  During the course of routine follow-up this year she was discovered to have crackles.  The notes also confirmed this.  This resulted in a high-resolution CT chest that shows interstitial lung disease predominantly with a craniocaudal gradient.  Reticulation and some traction  bronchiectasis but no honeycombing.  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  Therefore she has been referred here.  She tells me that she prefers to be very aggressive with the treatment and management approach.  She has a life goal of living up to 90 years.  Currently she is able to walk 2 miles every 2 weeks.  She never stops.  She does not feel much short of breath.  Initially she has some chest tightness and this resolves.  Is not much of a cough.  Past medical history - Negative for any specific connective tissue disease.  Negative for hiatal hernia and acid reflux. - Positive for ANA + and low titer rheumatoid factor [discussion with Dr. Algis Downs today:: Undifferentiated connective tissue disease:] - She has had the COVID disease in January 2022.  She also had COVID-vaccine 4 shots.  Review of systems - Raynaud's present - Osteoarthritis present  Family history of pulmonary disease - Brother has myasthenia gravis but otherwise negative  Exposure history - Smoked from [age] although in between she stopped between 1979 and 1983.  3 to 5 cigarettes/day.  She did some marijuana as a teenager.  Otherwise no cocaine use or no intravenous drug use.  Home and Hobby details - [place]  She has lived there for 2 years.  Is a private home.  Detail organic antigen exposure history in this house is negative.  She  is never used feather pillow or down jackets.  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  Detail organic and inorganic antigen exposure history at work is negative  Pulmonary toxicity history - Denies any adverse medication intake   Simple office walk 185 feet x  3 laps goal with forehead probe 08/26/2021    O2 used ra   Number laps completed C2 of 3   Comments about pace x   Resting Pulse Ox/HR 100% and 83/min   Final Pulse Ox/HR 100% and 130/min   Desaturated </= 88% no    Desaturated <= 3% points no   Got Tachycardic >/= 90/min yes   Symptoms at end of test x   Miscellaneous comments x      AUOTIMMUNE   Latest Reference Range & Units 10/17/16 12:14 08/17/17 14:55 03/07/18 14:48 07/03/19 11:32 06/16/20 14:15 11/13/20 00:00 07/06/21 15:20  Anti Nuclear Antibody (ANA) NEGATIVE        POSITIVE !  ANA Pattern 1        Nuclear, Nucleolar !  ANA Titer 1 titer       1:1,280 (H)  ds DNA Ab IU/mL $Remove'1 1 1 1 1 1 1  'qjBzfWI$ ENA SM Ab Ser-aCnc <1.0 NEG AI       <1.0 NEG  !: Data is abnormal (H): Data is abnormally high  Latest Reference Range & Units 07/06/21 15:20  Ribonucleic Protein(ENA) Antibody, IgG <1.0 NEG AI <1.0 NEG  ENA SM Ab Ser-aCnc <1.0 NEG AI <1.0 NEG  SSA (Ro) (ENA) Antibody, IgG <1.0 NEG AI <1.0 NEG  SSB (La) (ENA) Antibody, IgG <1.0 NEG AI <1.0 NEG  Scleroderma (Scl-70) (ENA) Antibody, IgG <1.0 NEG AI <1.0 NEG    Latest Reference Range & Units 10/17/16 12:14 08/17/17 14:55 03/07/18 14:48 07/03/19 11:32 06/16/20 14:15 11/13/20 00:00 07/06/21 15:20  Sed Rate 0 - 30 mm/h  $Rem'9 1 6 6 9 11  'bkUO$ Anticardiolipin Ab,IgA,Qn APL <11 <11       Anticardiolipin Ab,IgG,Qn GPL <14 <14       Anticardiolipin Ab,IgM,Qn MPL <12 <12        CT Chest data - HRCT March 2023  Narrative & Impression  CLINICAL DATA:  Autoimmune disease, crackles in chest.   EXAM: CT CHEST WITHOUT CONTRAST   TECHNIQUE: Multidetector CT imaging of the chest was performed following the standard protocol without intravenous contrast. High resolution imaging of the lungs, as well as inspiratory and expiratory imaging, was performed.   RADIATION DOSE REDUCTION: This exam was performed according to the departmental dose-optimization program which includes automated exposure control, adjustment of the mA and/or kV according to patient size and/or use of iterative reconstruction technique.   COMPARISON:  None.   FINDINGS: Cardiovascular: Atherosclerotic calcification of the aorta, aortic valve  and coronary arteries. Enlarged right and left pulmonary arteries and heart. Small amount of pericardial fluid may be physiologic.   Mediastinum/Nodes: Mediastinal lymph nodes measure up to 10 mm in the AP window. Hilar regions are difficult to evaluate without IV contrast. Small axillary and subpectoral lymph nodes with the largest measuring 1.4 cm in the right axilla. Esophagus is grossly unremarkable.   Lungs/Pleura: Peripheral basilar subpleural reticulation, ground-glass and traction bronchiectasis/bronchiolectasis. No definitive honeycombing. Probable subpleural lymph nodes along the right major fissure, measuring up to 5 mm. Calcified granulomas. Noncalcified 5 mm lingular nodule (4 x 6 mm, 6/73). No pleural fluid. Airway is unremarkable. Mild air trapping.   Upper Abdomen:  Visualized portion of the liver is unremarkable. Stone in the gallbladder. Visualized portions of the adrenal glands, kidneys, spleen and pancreas are grossly unremarkable. Gastric bypass.   Musculoskeletal: Degenerative changes in the spine. Old right rib fractures. No worrisome lytic or sclerotic lesions.   IMPRESSION: 1. Pulmonary parenchymal pattern of interstitial lung disease may be due to usual interstitial pneumonitis or nonspecific interstitial pneumonitis. Findings are categorized as probable UIP per consensus guidelines: Diagnosis of Idiopathic Pulmonary Fibrosis: An Official ATS/ERS/JRS/ALAT Clinical Practice Guideline. Am Rosezetta Schlatter Crit Care Med Vol 198, Iss 5, [id number], Dec 31 2016. 2. Small amount of pericardial fluid may be physiologic. 3. 5 mm lingular nodule. If the patient is high-risk, a non-contrast chest CT can be considered in 12 months.This recommendation follows the consensus statement: Guidelines for Management of Incidental Pulmonary Nodules Detected on CT Images: From the Fleischner Society 2017; Radiology 2017; 284:228-243. 4. Small to borderline enlarged mediastinal and  axillary lymph nodes. Difficult to exclude a lymphoproliferative disorder. 5. Cholelithiasis. 6. Aortic atherosclerosis (ICD10-I70.0). Coronary artery calcification. 7. Enlarged pulmonary arteries, indicative of pulmonary arterial hypertension. 8.  Emphysema (ICD10-J43.9).     Electronically Signed   By: Leanna Battles M.D.   On: 07/19/2021 10:34      No results found.    PFT      View : No data to display.             has a past medical history of Anemia, 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HCC), Blood transfusion without reported diagnosis, Cataract, GERD (gastroesophageal reflux disease), Primary osteoarthritis of right knee, Raynaud disease, Raynaud disease, Spinal headache, and Vasculitis (HCC).   reports that she quit smoking about 24 years ago. Her smoking use included cigarettes. She has a 3.75 pack-year smoking history. She has been exposed to tobacco smoke. She has never used smokeless tobacco.  Past Surgical History:  Procedure Laterality Date   abdominal clip     to stomach post gastric bypass   AXILLARY LYMPH NODE BIOPSY Right 01/27/2021   Procedure: RIGHT AXILLARY LYMPH NODE BIOPSY;  Surgeon: Harriette Bouillon, MD;  Location: [facility];  Service: General;  Laterality: Right;   CESAREAN SECTION     x 2   GASTRIC BYPASS     HERNIA REPAIR  2010   inverted hermia   HIP ARTHROPLASTY     JOINT REPLACEMENT Right 2005   hip   JOINT REPLACEMENT Bilateral    knee    KNEE ARTHROPLASTY Left 09/13/2013   Procedure: LEFT COMPUTER ASSISTED TOTAL KNEE ARTHROPLASTY;  Surgeon: Eldred Manges, MD;  Location: [facility] OR;  Service: Orthopedics;  Laterality: Left;  Left Total Knee Arthroplasty, Computer Assist   KNEE ARTHROPLASTY Right 05/23/2014   Procedure: COMPUTER ASSISTED TOTAL KNEE ARTHROPLASTY;  Surgeon: Eldred Manges, MD;  Location: [facility] OR;  Service: Orthopedics;  Laterality: Right;   LUMBAR LAMINECTOMY/DECOMPRESSION MICRODISCECTOMY N/A 05/13/2016   Procedure: L4-5  Decompression;  Surgeon: Eldred Manges, MD;  Location[facility];  Service: Orthopedics;  Laterality: N/A;   teeth etraction     TOTAL KNEE ARTHROPLASTY Right 05/22/2014   dr Ophelia Charter   TUBAL LIGATION  1981   UPPER GI ENDOSCOPY      Allergies  Allergen Reactions   Ginger Swelling    Stomach swells   Pork-Derived Products Other (See Comments)    UNSPECIFIED "PERSONAL REASONS"   Shellfish Allergy Other (See Comments)    UNSPECIFIED "Personal reasons/dietary"    Immunization History  Administered Date(s) Administered  PFIZER Comirnaty(Gray Top)Covid-19 Tri-Sucrose Vaccine 05/21/2019, 06/11/2019, 04/05/2020, 10/13/2020   PFIZER(Purple Top)SARS-COV-2 Vaccination 05/21/2019, 06/11/2019, 04/05/2020   Zoster Recombinat (Shingrix) 03/08/2018, 05/08/2018    Family History  Problem Relation Age of Onset   Cancer Brother    Multiple myeloma Brother    Colon cancer Neg Hx    Esophageal cancer Neg Hx    Rectal cancer Neg Hx    Stomach cancer Neg Hx      Current Outpatient Medications:    ASPIRIN 81 PO, Take by mouth daily., Disp: , Rfl:    Black Pepper-Turmeric (TURMERIC CURCUMIN) 08-998 MG CAPS, Take 1,000 mg by mouth daily., Disp: , Rfl:    Cholecalciferol (CVS D3) 25 MCG (1000 UT) capsule, Take 1,000 Units by mouth daily., Disp: , Rfl:    Fe Bisgly-Succ-C-Thre-B12-FA (IRON-150 PO), Take daily by mouth., Disp: , Rfl:    hydroxychloroquine (PLAQUENIL) 200 MG tablet, TAKE 1 TABLET BY MOUTH EVERY DAY, Disp: 90 tablet, Rfl: 0   Multiple Vitamin (MULTIVITAMIN WITH MINERALS) TABS tablet, Take 1 tablet by mouth daily., Disp: , Rfl:    psyllium (METAMUCIL) 58.6 % powder, Take 1 packet by mouth in the morning and at bedtime., Disp: , Rfl:   Current Facility-Administered Medications:    0.9 %  sodium chloride infusion, 500 mL, Intravenous, Once, Armbruster, Carlota Raspberry, MD      Objective:   Vitals:   08/26/21 0857  BP: 122/74  Pulse: 83  SpO2: 100%  Weight: 182 lb 3.2 oz (82.6 kg)  Height:  5' (1.524 m)    Estimated body mass index is 35.58 kg/m as calculated from the following:   Height as of this encounter: 5' (1.524 m).   Weight as of this encounter: 182 lb 3.2 oz (82.6 kg).  $Rem'@WEIGHTCHANGE'Izki$ @  [employer]   08/26/21 0857  Weight: 182 lb 3.2 oz (82.6 kg)     Physical Exam  General: No distress. Looks wel Neuro: Alert and Oriented x 3. GCS 15. Speech normal Psych: Pleasant Resp:  Barrel Chest - no.  Wheeze - no, Crackles - YES, No overt respiratory distress CVS: Normal heart sounds. Murmurs - no Ext: Stigmata of Connective Tissue Disease - RAYNAUD. HAS CANE HEENT: Normal upper airway. PEERL +. No post nasal drip        Assessment:       ICD-10-CM   1. ILD (interstitial lung disease) (HCC)  J84.9 Angiotensin converting enzyme    Myositis Specific II Antibodies Panel    MyoMarker 3 Plus Profile (RDL)    CK total and CKMB (cardiac)not at [facility]    Aldolase    Hypersensitivity Pneumonitis    QuantiFERON-TB Gold Plus    Thiopurine methyltransferase(tpmt)rbc    Glucose 6 phosphate dehydrogenase    Pulmonary function test    Glucose 6 phosphate dehydrogenase    Thiopurine methyltransferase(tpmt)rbc    QuantiFERON-TB Gold Plus    Hypersensitivity Pneumonitis    Aldolase    CK total and CKMB (cardiac)not at [facility]    MyoMarker 3 Plus Profile (RDL)    Angiotensin converting enzyme    2. ANA positive  R76.8     3. Raynaud's disease without gangrene  I73.00          Plan:     Patient Instructions     ICD-10-CM   1. ILD (interstitial lung disease) ([place])  J84.9     2. ANA positive  R76.8     3. Raynaud's disease without gangrene  I73.00  You have ILD or called Pulmonary Fibrosis Basis is likely autoimmune  Plan  - to narrow possibilities further ACE, Myosits panel, CK, Aldolase, HP panel, QUantiferon gold blood work 08/26/2021 - to prepare for future potential treatments do - do G6PD, TPMT test,  08/26/2021 - do full PFT next few weeks  -  do simple walk test at next visit  - Broadly speaking we discussed 3 types of treatment approaches  -Immunosuppressive treatment with Imuran  -Antifibrotic treatment with nintedanib or a medication called pirfenidone  1-watchful waiting  -Currently leaning towards antifibrotic therapies -Visit [email or web address] for any and general learning  Follow-up - Next few to several weeks to see Dr. Chase Caller to review test results and to discuss next steps in treatment     SIGNATURE    Dr. Brand Males, M.D., F.C.C.P,  Pulmonary and Critical Care Medicine Staff Physician, [place] Director - Interstitial Lung Disease  Program  Pulmonary [place] at [place], [place], [address]  Pager: [phone], If no answer or between  15:00h - 7:00h: call [phone] Telephone: [phone]  12:41 PM 08/26/2021

## 2021-08-27 ENCOUNTER — Telehealth (HOSPITAL_COMMUNITY): Payer: Self-pay | Admitting: Surgery

## 2021-08-27 NOTE — Telephone Encounter (Signed)
Patient contacted to let her know that insurance prior authorization is not needed and that it is fine to proceed with ordered home sleep study.  She tells me that she will perform the study very soon. ?

## 2021-08-28 ENCOUNTER — Encounter (INDEPENDENT_AMBULATORY_CARE_PROVIDER_SITE_OTHER): Payer: Medicare Other | Admitting: Cardiology

## 2021-08-28 DIAGNOSIS — M359 Systemic involvement of connective tissue, unspecified: Secondary | ICD-10-CM

## 2021-08-28 DIAGNOSIS — I270 Primary pulmonary hypertension: Secondary | ICD-10-CM

## 2021-08-30 ENCOUNTER — Ambulatory Visit: Payer: PRIVATE HEALTH INSURANCE

## 2021-08-30 DIAGNOSIS — I272 Pulmonary hypertension, unspecified: Secondary | ICD-10-CM

## 2021-08-30 NOTE — Procedures (Signed)
? ?  SLEEP STUDY REPORT ?Patient Information ?Study Date: 08/28/21 ?Patient Name: Chelsea Callahan ?Patient ID: [id number] ?Birth Date: 01/11/1951 ?Age: 71 ?Gender: Female ?Referring Physician:Daniel Bensimhon, MD ? ?TEST DESCRIPTION: Home sleep apnea testing was completed using the WatchPat, a Type 1 device, utilizing ?peripheral arterial tonometry (PAT), chest movement, actigraphy, pulse oximetry, pulse rate, body position and snore. ?AHI was calculated with apnea and hypopnea using valid sleep time as the denominator. RDI includes apneas, ?hypopneas, and RERAs. The data acquired and the scoring of sleep and all associated events were performed in ?accordance with the recommended standards and specifications as outlined in the AASM Manual for the Scoring of ?Sleep and Associated Events 2.2.0 (2015). ? ?FINDINGS: ?1. No evidence of Obstructive Sleep Apnea with AHI 4.3/hr. ?2. No Central Sleep Apnea. ?3. Oxygen desaturations as low as 88%. ?4. Mild snoring was present. O2 sats were < 88% for 0 minutes. ?5. Total sleep time was 5 hrs and 52 min. ?6. 8.2% of total sleep time was spent in REM sleep. ?7. Normal sleep onset latency at 16 min. ?8. Shortened REM sleep onset latency at <MEASURBEngineeSafeco Corp831Dulce NJanRoAndrey CampanBEngineeSafeco Corp4Dulce NJanRo339-1[facility] Andrey Campani<MEASUREMSignature BEngineeSafeco Corp(770Dulce NJanRo706-8Chalmers P. WyliAndrey CampanBEngineeSafeco Corp830Dulce NJanRo531-5[facility]ale [facility] Andrey Campani<MEASUREMProviBEngineeSafeco Corp639Dulce NJanRo720-5HAndrey Campanil<MEASUREMCBEngineeSafeco Corp(831)DulAndrey Campani<MEASUREMBon SecourBEngineeSafeco Corp(410Dulce NJanRo309-7Adventist HeAndrey Campani<MEASUREMThe Matheny MeBEngineeSafeco Corp571Dulce NJanRoAndrey Campani<MEASUREMFair Oaks PavBEngineeSafeco Corp424Dulce [phone] OnAndrey Campanil<MEASUREMBEngineeSafeco Corp309Dulce NJanRo915-6Select SAndrey Campa<MBEngineeSafeco Corp619Dulce NJanRo574-1[facility] Andrey Campanil<MBEngineeSafeco Corp951Dulce [phone] 6PrimarAndrey CaBEngineeSafeco Corp504Dulce NJanRo604-4Bryn MAndrey Campanil<MBEngineeSafeco Corp(219Dulce NJanRo512-5Select SpecialAndrey Campanil<MEASUBEngineeSafeco Corp(337)Dulce NJanRo671MAndrey Campani<MEASUREBEngineeSafeco Corp310Dulce NJanRo8Andrey Campanil<MEABEngineeSafeco Corp(905)Dulce [phone] HeAndrey Campani<MBEngineeSafeco Corp313Dulce [phone] Campani<MEASUREMBaylor Surgicare At Bay[facility] Dba Baylor Scott And WhiteBEngineeSafeco Corp432Dulce NJanRo985 3Saint Luke'SAndrey Campani<MEASUREMOchsneBEngineeSafeco Corp780Dulce NJanRo501 7Heart OfAndrey Campa<MEASUREMV Covinton LLC BEngineeSafeco Corp986Dulce NJanRo708 3Southern KentuAndrey Campa<MEASUREMEncompass Health BEngineeSafeco Corp501Dulce NJanRo574-6[facility]yn AmAndrey Campani<MEASUREMCamc WBEngineeSafeco Corp(303Dulce NJanRo919 7MedicAndrey CampaBEngineeSafeco Corp(337Dulce NJanRo951-7Andrey Campani<MEASUBEngineeSafeco Corp(973Dulce NJanRAndrey Campani<MEABEngineeSafeco Corp(412Dulce NJanRo805-0Cam[place] CoAndrey Campani<MEASURAndrey Campan<MEASBEngineeSafeco Corp548Dulce NJanRo254 2Bryn MAndrey [phone] 4. ? ?DIAGNOSIS: ?Normal study with no significant sleep disordered breathing. ? ?RECOMMENDATIONS: ?1. Normal study with no significant sleep disordered breathing. ? ?2. Healthy sleep recommendations include: adequate nightly sleep (normal 7-9 hrs/night), avoidance of caffeine after ?noon and alcohol near bedtime, and maintaining a sleep environment that is cool, dark and quiet. ? ?3. Weight loss for overweight patients is recommended. ? ?4. Snoring recommendations include: weight loss where appropriate, side sleeping, and avoidance of alcohol before ?bed. ? ?5. Operation of motor vehicle or dangerous equipment must be avoided when feeling drowsy, excessively sleepy, or ?mentally fatigued. ? ?6. An ENT consultation  which may be useful for specific causes of and possible treatment of bothersome snoring . ? ?7. Weight loss may be of benefit in reducing the severity of snoring.  ? ?Signature: ?Electronically Signed: 08/30/21 ?Everard Interrante, MD; FACC; Diplomat American Board of Sleep Medicine ?

## 2021-08-31 LAB — CK TOTAL AND CKMB (NOT AT ARMC)
CK, MB: 2.9 ng/mL (ref 0–5.0)
Relative Index: 2.2 (ref 0–4.0)
Total CK: 133 U/L (ref 29–143)

## 2021-08-31 LAB — ALDOLASE: Aldolase: 4 U/L (ref <=8.1)

## 2021-09-01 ENCOUNTER — Other Ambulatory Visit: Payer: Self-pay | Admitting: Cardiology

## 2021-09-01 ENCOUNTER — Telehealth: Payer: Self-pay | Admitting: *Deleted

## 2021-09-01 DIAGNOSIS — M359 Systemic involvement of connective tissue, unspecified: Secondary | ICD-10-CM

## 2021-09-01 NOTE — Telephone Encounter (Signed)
-----   Message from Quintella Reichert, MD sent at 08/30/2021  1:37 PM EDT ----- ?Normal home sleep study so in lab PSG will be ordered  ?

## 2021-09-01 NOTE — Telephone Encounter (Signed)
Patient notified of normal itamar, however per Dr Radford Pax she wants patient to have in lab NPSG. Patient agrees to proceed. ?

## 2021-09-01 NOTE — Telephone Encounter (Signed)
Per patients [place] no PA is required for any services. Since Medicare is patient's primary insurance they will follow their guidelines. Call reference # [id number]. ?

## 2021-09-02 LAB — MYOSITIS SPECIFIC II ANTIBODIES PANEL
EJ AB: <11 SI (ref <11)
JO-1 AB: <11 SI (ref <11)
MDA-5 AB: <11 SI (ref <11)
MI-2 ALPHA AB: <11 SI (ref <11)
MI-2 BETA AB: <11 SI (ref <11)
NXP-2 AB: <11 SI (ref <11)
OJ AB: <11 SI (ref <11)
PL-12 AB: <11 SI (ref <11)
PL-7 AB: <11 SI (ref <11)
SRP-AB: <11 SI (ref <11)
TIF-1y AB: <11 SI (ref <11)

## 2021-09-03 LAB — HYPERSENSITIVITY PNEUMONITIS
A. Pullulans Abs: NEGATIVE
A.Fumigatus #1 Abs: NEGATIVE
Micropolyspora faeni, IgG: NEGATIVE
Pigeon Serum Abs: NEGATIVE
Thermoact. Saccharii: NEGATIVE
Thermoactinomyces vulgaris, IgG: NEGATIVE

## 2021-09-07 ENCOUNTER — Telehealth: Payer: Self-pay | Admitting: Internal Medicine

## 2021-09-08 LAB — QUANTIFERON-TB GOLD PLUS
Mitogen-NIL: 5.01 IU/mL
NIL: 0.02 IU/mL
QuantiFERON-TB Gold Plus: NEGATIVE
TB1-NIL: <0 IU/mL
TB2-NIL: <0 IU/mL

## 2021-09-08 LAB — THIOPURINE METHYLTRANSFERASE (TPMT), RBC: Thiopurine Methyltransferase, RBC: 14 nmol/hr/mL RBC

## 2021-09-08 LAB — ANGIOTENSIN CONVERTING ENZYME: Angiotensin-Converting Enzyme: 25 U/L (ref 9–67)

## 2021-09-08 LAB — GLUCOSE 6 PHOSPHATE DEHYDROGENASE: G-6PDH: 16.1 U/g Hgb (ref 7.0–20.5)

## 2021-09-16 ENCOUNTER — Telehealth: Payer: Self-pay | Admitting: Internal Medicine

## 2021-09-16 LAB — MYOMARKER 3 PLUS PROFILE (RDL)
Anti-EJ Ab (RDL): NEGATIVE
Anti-Jo-1 Ab (RDL): <20 Units (ref <20)
Anti-Ku Ab (RDL): NEGATIVE
Anti-MDA-5 Ab (CADM-140)(RDL): <20 Units (ref <20)
Anti-Mi-2 Ab (RDL): NEGATIVE
Anti-NXP-2 (P140) Ab (RDL): <20 Units (ref <20)
Anti-OJ Ab (RDL): NEGATIVE
Anti-PL-12 Ab (RDL: NEGATIVE
Anti-PL-7 Ab (RDL): NEGATIVE
Anti-PM/Scl-100 Ab (RDL): <20 Units (ref <20)
Anti-SAE1 Ab, IgG (RDL): <20 Units (ref <20)
Anti-SRP Ab (RDL): NEGATIVE
Anti-SS-A 52kD Ab, IgG (RDL): 74 Units — ABNORMAL HIGH (ref <20)
Anti-TIF-1gamma Ab (RDL): <20 Units (ref <20)
Anti-U1 RNP Ab (RDL): <20 Units (ref <20)
Anti-U2 RNP Ab (RDL): NEGATIVE
Anti-U3 RNP (Fibrillarin)(RDL): NEGATIVE

## 2021-09-16 NOTE — Telephone Encounter (Signed)
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 Kalman Shan, M.D., F.C.C.P,  Pulmonary and Critical Care Medicine Staff Physician, [facility] Director - Interstitial Lung Disease  Program  Medical Director - [facility] Pulmonary Fibrosis [facility] at [place], [place], [address]  NPI Number:  NPI #[id number] [facility] Number: [id number]  Pager: [phone], If no answer  -> Check AMION or Try [phone] Telephone (clinical office): [phone] Telephone (research): [phone]  11:44 PM 09/16/2021

## 2021-09-17 NOTE — Telephone Encounter (Signed)
Murali, Anti-SSA 52 KD antibodies are specific for myositis and is different from Sjogren's SSA antibody.  Patient CK always has been normal.  Although you could have dermatomyositis with normal CK.  She should be treated with immunosuppressive agents.  Are you planning to start her on Imuran or CellCept?

## 2021-09-22 ENCOUNTER — Ambulatory Visit: Payer: PRIVATE HEALTH INSURANCE | Admitting: Orthopaedic Surgery

## 2021-09-22 NOTE — Telephone Encounter (Signed)
Hi Shaili  I am fine with you starting because you know her long time. I met her once. My app is seeing her approx 10/08/21/ We can consider anti-fibrotics while you consider immune suppression (she is seeing you 10/12/21). Does that work for you? If not please let us know. We can also start and you/us can manager  Thanks    SIGNATURE    Dr. Brand Males, M.D., F.C.C.P,  Pulmonary and Critical Care Medicine Staff Physician, [place] Director - Interstitial Lung Disease  Program  Medical Director - [place] ICU Pulmonary [place] at [place], [place], [address]  NPI Number:  NPI #[id number] [facility] Number: [id number]  Pager: [phone], If no answer  -[place] or Try [phone] Telephone (clinical office): [phone] Telephone (research): [phone]  9:47 AM 09/22/2021

## 2021-09-22 NOTE — Progress Notes (Signed)
[facility], LEt patient now that since last update that one antibody in mysoitis panel is abnormal. I have alerted Dr Corliss Skains. Her team and [place] are discussing how best to treat this but Dr Loni Beckwith has indicated that Rx will be needed. Thanks

## 2021-09-22 NOTE — Telephone Encounter (Signed)
What would you recommend for her treatment do prefer CellCept or Imuran for ILD?

## 2021-09-23 ENCOUNTER — Telehealth: Payer: Medicare Other | Admitting: Physician Assistant

## 2021-09-23 DIAGNOSIS — R899 Unspecified abnormal finding in specimens from other organs, systems and tissues: Secondary | ICD-10-CM

## 2021-09-23 NOTE — Progress Notes (Signed)
Virtual Visit Consent   Chelsea Callahan, you are scheduled for a virtual visit with a [facility] provider today. Just as with appointments in the office, your consent must be obtained to participate. Your consent will be active for this visit and any virtual visit you may have with one of our providers in the next 365 days. If you have a MyChart account, a copy of this consent can be sent to you electronically.  As this is a virtual visit, video technology does not allow for your provider to perform a traditional examination. This may limit your provider's ability to fully assess your condition. If your provider identifies any concerns that need to be evaluated in person or the need to arrange testing (such as labs, EKG, etc.), we will make arrangements to do so. Although advances in technology are sophisticated, we cannot ensure that it will always work on either your end or our end. If the connection with a video visit is poor, the visit may have to be switched to a telephone visit. With either a video or telephone visit, we are not always able to ensure that we have a secure connection.  By engaging in this virtual visit, you consent to the provision of healthcare and authorize for your insurance to be billed (if applicable) for the services provided during this visit. Depending on your insurance coverage, you may receive a charge related to this service.  I need to obtain your verbal consent now. Are you willing to proceed with your visit today? Chelsea Callahan has provided verbal consent on 09/23/2021 for a virtual visit (video or telephone). Chelsea Callahan, [place]  Date: 09/23/2021 7:08 PM  Virtual Visit via Video Note   I, Chelsea Callahan, connected with  Tyteanna Ost  ([id number], [birth year]) on 09/23/21 at  7:00 PM EDT by a video-enabled telemedicine application and verified that I am speaking with the correct person using two identifiers.  Location: Patient: Virtual Visit Location Patient:  Home Provider: Virtual Visit Location Provider: Home Office   I discussed the limitations of evaluation and management by telemedicine and the availability of in person appointments. The patient expressed understanding and agreed to proceed.    History of Present Illness: Chelsea Callahan is a 71 y.o. who identifies as a female who was assigned female at birth, and is being seen today for advice regarding recent lab results. Seems she had autoimmune panel along with other labs drawn at visit with her pulmonologist at the end of April. Notes she was originally told everything looked good but recently was notified that one of the tests got an updated result which was abnormal. States she was supposed to be started on a medication but has not heard anything from either her Pulmonologist or Rheumatologist who were working on things for her. States she is concerned because she wants to make sure she is treated but no one has reached out to her.   HPI: HPI  Problems:  Patient Active Problem List   Diagnosis Date Noted   Anemia 08/13/2021   Right leg swelling 08/13/2021   Chronic pain of both knees 12/27/2017   Pain in left hand 12/27/2017   History of lumbar laminectomy for spinal cord decompression 12/27/2017   Lumbar stenosis 05/13/2016   High risk medication use 05/11/2016   History of total hip replacement, right 05/11/2016   Primary osteoarthritis of both feet 05/11/2016   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   Low  back pain 03/09/2016   Spinal stenosis of lumbar region 03/09/2016   Osteoarthritis of right knee 05/27/2014   History of total knee replacement, bilateral 05/23/2014   Autoimmune disease (HCC) 09/24/2013   Osteoarthritis of left knee 09/13/2013    Class: Diagnosis of    Allergies:  Allergies  Allergen Reactions   Ginger Swelling    Stomach swells   Pork-Derived Products Other (See Comments)    UNSPECIFIED "PERSONAL REASONS"    Shellfish Allergy Other (See Comments)    UNSPECIFIED "Personal reasons/dietary"   Medications:  Current Outpatient Medications:    ASPIRIN 81 PO, Take by mouth daily., Disp: , Rfl:    Black Pepper-Turmeric (TURMERIC CURCUMIN) 08-998 MG CAPS, Take 1,000 mg by mouth daily., Disp: , Rfl:    Cholecalciferol ([employer]S D3) 25 MCG (1000 UT) capsule, Take 1,000 Units by mouth daily., Disp: , Rfl:    Fe Bisgly-Succ-C-Thre-B12-FA (IRON-150 PO), Take daily by mouth., Disp: , Rfl:    hydroxychloroquine (PLAQUENIL) 200 MG tablet, TAKE 1 TABLET BY MOUTH EVERY DAY, Disp: 90 tablet, Rfl: 0   Multiple Vitamin (MULTIVITAMIN WITH MINERALS) TABS tablet, Take 1 tablet by mouth daily., Disp: , Rfl:    psyllium (METAMUCIL) 58.6 % powder, Take 1 packet by mouth in the morning and at bedtime., Disp: , Rfl:   Current Facility-Administered Medications:    0.9 %  sodium chloride infusion, 500 mL, Intravenous, Once, Armbruster, Willaim Rayas, MD  Observations/Objective: Patient is well-developed, well-nourished in no acute distress.  Resting comfortably at home.  Head is normocephalic, atraumatic.  No labored breathing. Speech is clear and coherent with logical content.  Patient is alert and oriented at baseline.   Assessment and Plan: 1. Abnormal laboratory test result  Elevated Anti-SS Ab level. On chart review seems her specialists have been in communication with each other but have not come to a consensus on what medication would be better to start. Let her know that it seems they are working together to figure out what will be best and to reach out to the tomorrow. Also let her know I would send them each a direct message in the system just so they are aware of her concern and can reach out to her.   No charge for visit.   Follow Up Instructions: I discussed the assessment and treatment plan with the patient. The patient was provided an opportunity to ask questions and all were answered. The patient agreed with the  plan and demonstrated an understanding of the instructions.  A copy of instructions were sent to the patient via MyChart unless otherwise noted below.   The patient was advised to call back or seek an in-person evaluation if the symptoms worsen or if the condition fails to improve as anticipated.  Time:  I spent 10 minutes with the patient via telehealth technology discussing the above problems/concerns.    Chelsea Climes, PA-C

## 2021-09-24 ENCOUNTER — Telehealth: Payer: Self-pay | Admitting: Rheumatology

## 2021-09-24 ENCOUNTER — Encounter: Payer: Self-pay | Admitting: Nurse Practitioner

## 2021-09-24 ENCOUNTER — Ambulatory Visit (INDEPENDENT_AMBULATORY_CARE_PROVIDER_SITE_OTHER): Payer: Medicare Other | Admitting: Nurse Practitioner

## 2021-09-24 ENCOUNTER — Telehealth: Payer: Self-pay | Admitting: Internal Medicine

## 2021-09-24 VITALS — BP 158/80 | HR 70 | Temp 98.4°F | Ht 60.0 in | Wt 183.0 lb

## 2021-09-24 DIAGNOSIS — L603 Nail dystrophy: Secondary | ICD-10-CM | POA: Insufficient documentation

## 2021-09-24 DIAGNOSIS — F419 Anxiety disorder, unspecified: Secondary | ICD-10-CM | POA: Diagnosis not present

## 2021-09-24 DIAGNOSIS — R2231 Localized swelling, mass and lump, right upper limb: Secondary | ICD-10-CM | POA: Insufficient documentation

## 2021-09-24 NOTE — Telephone Encounter (Signed)
Patient called the office stating she had labs done by Dr. Marchelle Gearingamaswamy and that Dr. Corliss Skainseveshwar was supposed to call her about starting a new medication. Patient states she is very upset that Dr. Corliss Skainseveshwar has not contacted her yet. Patient states she has a terminal illness that needs to be addressed by Dr. Corliss Skainseveshwar urgently and that she does not have time for this to not be addressed. Patient requests a call back as soon as possible.

## 2021-09-24 NOTE — Assessment & Plan Note (Signed)
Enlarged lymph node/mass noted to the right axilla.  It is nontender, and easily movable.  Consulted with supervising physician regarding getting additional imaging versus referral to discuss biopsy with general surgery.  As her lymphadenopathy has been noted in the past as well as on recent imaging recommendation is to refer patient to general surgery for biopsy for further evaluation.  Referral ordered today.

## 2021-09-24 NOTE — Progress Notes (Signed)
   Subjective:  Patient ID: Chelsea Callahan, female    DOB: 04/17/1951  Age: 71 y.o. MRN: [id number]  CC:  Chief Complaint  Patient presents with   Referral   Lymphadenopathy      HPI  This patient arrives today for the above.  Right axillary adenopathy: Patient presents today with adenopathy of the right axilla since September that changes in size. Patient denies pain, discharge from area or nipple, or skin changes. The mass has been biopsied before because they suspected lymphoma, but results indicated that it was benign. She requests another follow up to have this evaluated once again.  She had CT scan a few months ago which did note the enlarged lymph node and it was measured at 1.4 cm at that time.  She is also following with pulmonology and rheumatology for interstitial lung disease.  She is waiting to hear back from them regarding treatment plans, she is very concerned and a bit anxious about her prognosis and disease progression.  She looked up pulmonary fibrosis on her own and discovered illness is often terminal within 3 to 5 years of diagnosis.  She would like to discuss her emotions with counseling services if possible.  Past Medical History:  Diagnosis Date   Anemia    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HCC)    Blood transfusion without reported diagnosis    Cataract    bil/ per pt   GERD (gastroesophageal reflux disease)    Primary osteoarthritis of right knee    Raynaud disease    Raynaud disease    Spinal headache    c-section   Vasculitis (HCC)    on prednisone -       Family History  Problem Relation Age of Onset   Cancer Brother    Multiple myeloma Brother    Colon cancer Neg Hx    Esophageal cancer Neg Hx    Rectal cancer Neg Hx    Stomach cancer Neg Hx     Social History   Social History Narrative   Not on file   Social History   Tobacco Use   Smoking status: Former    Packs/day: 0.25    Years: 15.00    Pack years: 3.75    Types: Cigarettes     Quit date: 1999    Years since quitting: 24.4    Passive exposure: Past   Smokeless tobacco: Never  Substance Use Topics   Alcohol use: Yes    Alcohol/week: 4.0 - 6.0 standard drinks    Types: 4 - 6 Glasses of wine per week    Comment: weekly      Current Meds  Medication Sig   ASPIRIN 81 PO Take by mouth daily.   Black Pepper-Turmeric (TURMERIC CURCUMIN) 08-998 MG CAPS Take 1,000 mg by mouth daily.   Cholecalciferol (CVS D3) 25 MCG (1000 UT) capsule Take 1,000 Units by mouth daily.   Fe Bisgly-Succ-C-Thre-B12-FA (IRON-150 PO) Take daily by mouth.   hydroxychloroquine (PLAQUENIL) 200 MG tablet TAKE 1 TABLET BY MOUTH EVERY DAY   Multiple Vitamin (MULTIVITAMIN WITH MINERALS) TABS tablet Take 1 tablet by mouth daily.   psyllium (METAMUCIL) 58.6 % powder Take 1 packet by mouth in the morning and at bedtime.   Current Facility-Administered Medications for the 09/24/21 encounter (Office Visit) with Gray, Sarah E, NP  Medication   0.9 %  sodium chloride infusion    ROS:  Review of Systems  Constitutional:  Negative for chills, fever and weight loss.    Respiratory:  Negative for cough and shortness of breath.   Cardiovascular:  Negative for chest pain.  Gastrointestinal:  Negative for diarrhea, nausea and vomiting.  Musculoskeletal:  Negative for back pain and myalgias.  Neurological:  Negative for headaches.  Psychiatric/Behavioral:  The patient is nervous/anxious.     Objective:   Today's Vitals: BP (!) 158/80 (BP Location: Left Arm, Patient Position: Sitting, Cuff Size: Normal)   Pulse 70   Temp 98.4 F (36.9 C) (Oral)   Ht 5' (1.524 m)   Wt 183 lb (83 kg)   SpO2 98%   BMI 35.74 kg/m     09/24/2021    1:13 PM 08/26/2021    8:57 AM 08/24/2021    2:08 PM  Vitals with BMI  Height 5' 0" 5' 0" 5' 0"  Weight 183 lbs 182 lbs 3 oz 184 lbs  BMI 35.74 19.50 93.26  Systolic 712 458 099  Diastolic 80 74 78  Pulse 70 83 78     Physical Exam Constitutional:      Appearance:  Normal appearance. She is obese.  Cardiovascular:     Rate and Rhythm: Normal rate.  Lymphadenopathy:     Upper Body:     Right upper body: Axillary adenopathy (approximately one inch movable, round mass) present.  Skin:    General: Skin is warm and dry.  Neurological:     Mental Status: She is alert.  Psychiatric:     Comments: Reports feeling overly anxious about everything, but feels fine         Assessment and Plan   1. Axillary mass, right   2. Anxiety      Plan: See plan via problem list below.   Tests ordered Orders Placed This Encounter  Procedures   Ambulatory referral to Psychology   Ambulatory referral to General Surgery      No orders of the defined types were placed in this encounter.   Patient to follow-up as scheduled next month or sooner as needed  Ailene Ards, NP

## 2021-09-24 NOTE — Assessment & Plan Note (Signed)
We discussed her concerns.  We will reach out to her pulmonologist to determine prognosis and expected progression of disease, we will discuss this with patient once I hear back from pulmonology.  In the meantime patient is experiencing anxiety and stress related to her new diagnosis of interstitial lung disease.  Referral to counseling services made today.  Patient declines to try pharmacologic therapy for anxiety at this time.

## 2021-09-24 NOTE — Telephone Encounter (Signed)
Spoke with patient and advised patient, Dr. Corliss Skains did reach out to Dr. Marchelle Gearing on 09/22/2021 to ask him which medication he recommends for treatment. Patient advised we have not received a response and therefore we have been unable to reach out to her yet. Patient advised to contact Dr. Jane Canary office to have his assistant have him respond so we can move forward. Patient expressed understanding.

## 2021-09-28 NOTE — Telephone Encounter (Signed)
I was off for few days but I have responsded to Dr Corliss Skains that preference is cellcept over immuran but based on safety etc., immuran acceptable too

## 2021-09-28 NOTE — Telephone Encounter (Signed)
Probably cellcept over immuran. Immuran as 2nd choice here

## 2021-09-29 NOTE — Progress Notes (Deleted)
Office Visit Note  Patient: Chelsea Callahan             Date of Birth: 09-Jan-1951           MRN: [id number]             PCP: Ailene Ards, NP Referring: Bartholome Bill, MD Visit Date: 10/12/2021 Occupation: _0 @  Subjective:  No chief complaint on file.   History of Present Illness: Chelsea Callahan is a 71 y.o. female ***   Activities of Daily Living:  Patient reports morning stiffness for *** {minute/hour:19697}.   Patient {ACTIONS;DENIES/REPORTS:21021675::"Denies"} nocturnal pain.  Difficulty dressing/grooming: {ACTIONS;DENIES/REPORTS:21021675::"Denies"} Difficulty climbing stairs: {ACTIONS;DENIES/REPORTS:21021675::"Denies"} Difficulty getting out of chair: {ACTIONS;DENIES/REPORTS:21021675::"Denies"} Difficulty using hands for taps, buttons, cutlery, and/or writing: {ACTIONS;DENIES/REPORTS:21021675::"Denies"}  No Rheumatology ROS completed.   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   Axillary mass, right 09/24/2021   Anxiety 09/24/2021   Anemia 08/13/2021   Right leg swelling 08/13/2021   Annual physical exam 07/20/2020   Chronic pain of both knees 12/27/2017   Pain in left hand 12/27/2017   History of lumbar laminectomy for spinal cord decompression 12/27/2017   Refused pneumococcal vaccination 06/28/2016   Lumbar stenosis 05/13/2016   High risk medication use 05/11/2016   History of total hip replacement, right 05/11/2016   Primary osteoarthritis of both feet 05/11/2016   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   Low back pain 03/09/2016   Spinal stenosis of lumbar region 03/09/2016   Osteoarthritis of right knee 05/27/2014   History of total knee replacement, bilateral 05/23/2014   History of total knee replacement 05/02/2014   Autoimmune disease ([place]) 09/24/2013   Rheumatoid arthritis ([facility]) 09/24/2013   Osteoarthritis of left knee 09/13/2013    Class:  Diagnosis of    Past Medical History:  Diagnosis Date   Anemia    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[place])    Blood transfusion without reported diagnosis    Cataract    bil/ per pt   GERD (gastroesophageal reflux disease)    Primary osteoarthritis of right knee    Raynaud disease    Raynaud disease    Spinal headache    c-section   Vasculitis ([place])    on prednisone -     Family History  Problem Relation Age of Onset   Cancer Brother    Multiple myeloma Brother    Colon cancer Neg Hx    Esophageal cancer Neg Hx    Rectal cancer Neg Hx    Stomach cancer Neg Hx    Past Surgical History:  Procedure Laterality Date   abdominal clip     to stomach post gastric bypass   AXILLARY LYMPH NODE BIOPSY Right 01/27/2021   Procedure: RIGHT AXILLARY LYMPH NODE BIOPSY;  Surgeon: Erroll Luna, MD;  Location: [facility];  Service: General;  Laterality: Right;   CESAREAN SECTION     x 2   GASTRIC BYPASS     HERNIA REPAIR  2010   inverted hermia   HIP ARTHROPLASTY     JOINT REPLACEMENT Right 2005   hip   JOINT REPLACEMENT Bilateral    knee    KNEE ARTHROPLASTY Left 09/13/2013   Procedure: LEFT COMPUTER ASSISTED TOTAL KNEE ARTHROPLASTY;  Surgeon: Marybelle Killings, MD;  Location: [facility];  Service: Orthopedics;  Laterality: Left;  Left Total Knee Arthroplasty, Computer Assist   KNEE ARTHROPLASTY Right 05/23/2014  Procedure: COMPUTER ASSISTED TOTAL KNEE ARTHROPLASTY;  Surgeon: Marybelle Killings, MD;  Location: [facility];  Service: Orthopedics;  Laterality: Right;   LUMBAR LAMINECTOMY/DECOMPRESSION MICRODISCECTOMY N/A 05/13/2016   Procedure: L4-5 Decompression;  Surgeon: Marybelle Killings, MD;  Location: [facility];  Service: Orthopedics;  Laterality: N/A;   teeth etraction     TOTAL KNEE ARTHROPLASTY Right 05/22/2014   dr Lorin Mercy   [facility]     Social History   Social History Narrative   Not on file   Immunization History  Administered Date(s) Administered   PFIZER  Comirnaty(Gray Top)Covid-19 Tri-Sucrose Vaccine 05/21/2019, 06/11/2019, 04/05/2020, 10/13/2020   PFIZER(Purple Top)SARS-COV-2 Vaccination 05/21/2019, 06/11/2019, 04/05/2020   Zoster Recombinat (Shingrix) 03/08/2018, 05/08/2018   Zoster, Unspecified 03/08/2018, 05/08/2018     Objective: Vital Signs: There were no vitals taken for this visit.   Physical Exam   Musculoskeletal Exam: ***  CDAI Exam: CDAI Score: -- Patient Global: --; Provider Global: -- Swollen: --; Tender: -- Joint Exam 10/12/2021   No joint exam has been documented for this visit   There is currently no information documented on the homunculus. Go to the Rheumatology activity and complete the homunculus joint exam.  Investigation: No additional findings.  Imaging: No results found.  Recent Labs: Lab Results  Component Value Date   WBC 5.3 08/13/2021   HGB 11.5 (L) 08/13/2021   PLT 280.0 08/13/2021   NA 138 07/06/2021   K 4.7 07/06/2021   CL 101 07/06/2021   CO2 33 (H) 07/06/2021   GLUCOSE 79 07/06/2021   BUN 15 07/06/2021   CREATININE 0.78 07/06/2021   BILITOT 0.4 07/06/2021   ALKPHOS 62 01/26/2021   AST 29 07/06/2021   ALT 18 07/06/2021   PROT 6.6 07/06/2021   ALBUMIN 3.8 01/26/2021   CALCIUM 10.3 07/06/2021   GFRAA 90 06/16/2020   QFTBGOLDPLUS NEGATIVE 08/26/2021    Speciality Comments: PLQ eye exam: 06/16/2021 [facility] . Follow up in 1 year.  Procedures:  No procedures performed Allergies: Ginger, Pork-derived products, and Shellfish allergy   Assessment / Plan:     Visit Diagnoses: No diagnosis found.  Orders: No orders of the defined types were placed in this encounter.  No orders of the defined types were placed in this encounter.   Face-to-face time spent with patient was *** minutes. Greater than 50% of time was spent in counseling and coordination of care.  Follow-Up Instructions: No follow-ups on file.   Earnestine Mealing, CMA  Note - This record has  been created using Editor, commissioning.  Chart creation errors have been sought, but may not always  have been located. Such creation errors do not reflect on  the standard of medical care.

## 2021-10-01 ENCOUNTER — Telehealth: Payer: Self-pay | Admitting: *Deleted

## 2021-10-01 ENCOUNTER — Other Ambulatory Visit: Payer: Self-pay | Admitting: Physician Assistant

## 2021-10-01 DIAGNOSIS — J849 Interstitial pulmonary disease, unspecified: Secondary | ICD-10-CM

## 2021-10-01 DIAGNOSIS — M359 Systemic involvement of connective tissue, unspecified: Secondary | ICD-10-CM

## 2021-10-01 DIAGNOSIS — Z79899 Other long term (current) drug therapy: Secondary | ICD-10-CM

## 2021-10-01 NOTE — Telephone Encounter (Signed)
Chelsea Callahan advised Dr. Corliss Skains would like for her to come to the office prior to her appointment to have labs completed so we can discuss getting her started on medication at her follow up visit on 10/12/2021. Chelsea Callahan expressed understanding and will come to the office on 10/06/2021 to have them done.

## 2021-10-01 NOTE — Telephone Encounter (Signed)
-----   Message from Bo Merino, MD sent at 09/30/2021  4:38 PM EDT ----- Please get hepatitis B, hepatitis C, HIV, SPEP, immunoglobulins.   Thank you, SD ----- Message ----- From: Carole Binning, LPN Sent: [id number]  10:02 AM EDT To: Bo Merino, MD  I looked for the following labs you requested:  Hepatitis B,C: 02/28/2013 Negative TB Gold: 08/26/2021 Negative.  I did not find SPEP, Ig or HIV.

## 2021-10-01 NOTE — Telephone Encounter (Signed)
Next Visit: 10/12/2021  Last Visit: 07/06/2021  Labs: 07/06/2021 Hgb and hct are low. Rest of CBC WNL. CMP WNL  Eye exam: 06/16/2021 [facility]    Current Dose per office note 07/06/2021: Plaquenil 200 mg 1 tablet by mouth daily  GB:[id number] disease   Last Fill: 07/05/2021  Okay to refill Plaquenil?

## 2021-10-05 ENCOUNTER — Telehealth: Payer: Self-pay | Admitting: Internal Medicine

## 2021-10-05 DIAGNOSIS — I7300 Raynaud's syndrome without gangrene: Secondary | ICD-10-CM | POA: Diagnosis not present

## 2021-10-05 DIAGNOSIS — J849 Interstitial pulmonary disease, unspecified: Secondary | ICD-10-CM | POA: Diagnosis not present

## 2021-10-05 DIAGNOSIS — M1990 Unspecified osteoarthritis, unspecified site: Secondary | ICD-10-CM | POA: Diagnosis not present

## 2021-10-05 DIAGNOSIS — M359 Systemic involvement of connective tissue, unspecified: Secondary | ICD-10-CM | POA: Diagnosis not present

## 2021-10-05 DIAGNOSIS — R768 Other specified abnormal immunological findings in serum: Secondary | ICD-10-CM | POA: Diagnosis not present

## 2021-10-05 NOTE — Telephone Encounter (Signed)
  Call from Dr Deanne Coffer [facility] rheumatology practice.  He called to tell me that patient Chelsea Callahan has switched her rheumatology care to him.  We discussed ILD.  We discussed the myositis findings.  We took a shared decision making to recommend mycophenolate/CellCept.  He is going to start on 500 mg twice daily.

## 2021-10-06 DIAGNOSIS — M359 Systemic involvement of connective tissue, unspecified: Secondary | ICD-10-CM | POA: Diagnosis not present

## 2021-10-08 ENCOUNTER — Telehealth: Payer: Self-pay | Admitting: Primary Care

## 2021-10-08 ENCOUNTER — Ambulatory Visit (INDEPENDENT_AMBULATORY_CARE_PROVIDER_SITE_OTHER): Payer: Medicare Other | Admitting: Primary Care

## 2021-10-08 ENCOUNTER — Encounter: Payer: Self-pay | Admitting: Primary Care

## 2021-10-08 ENCOUNTER — Ambulatory Visit (INDEPENDENT_AMBULATORY_CARE_PROVIDER_SITE_OTHER): Payer: Medicare Other | Admitting: Internal Medicine

## 2021-10-08 VITALS — BP 120/60 | HR 74 | Temp 97.7°F | Ht 60.0 in | Wt 181.0 lb

## 2021-10-08 DIAGNOSIS — J849 Interstitial pulmonary disease, unspecified: Secondary | ICD-10-CM

## 2021-10-08 DIAGNOSIS — M359 Systemic involvement of connective tissue, unspecified: Secondary | ICD-10-CM | POA: Insufficient documentation

## 2021-10-08 LAB — PULMONARY FUNCTION TEST
DL/VA % pred: 104 %
DL/VA: 4.46 ml/min/mmHg/L
DLCO cor % pred: 102 %
DLCO cor: 17.27 ml/min/mmHg
DLCO unc % pred: 102 %
DLCO unc: 17.27 ml/min/mmHg
FEF 25-75 Post: 2.18 L/sec
FEF 25-75 Pre: 2.94 L/sec
FEF2575-%Change-Post: -26 %
FEF2575-%Pred-Post: 153 %
FEF2575-%Pred-Pre: 207 %
FEV1-%Change-Post: -2 %
FEV1-%Pred-Post: 125 %
FEV1-%Pred-Pre: 129 %
FEV1-Post: 1.87 L
FEV1-Pre: 1.93 L
FEV1FVC-%Change-Post: 0 %
FEV1FVC-%Pred-Pre: 116 %
FEV6-%Change-Post: -2 %
FEV6-%Pred-Post: 113 %
FEV6-%Pred-Pre: 116 %
FEV6-Post: 2.09 L
FEV6-Pre: 2.14 L
FEV6FVC-%Pred-Post: 104 %
FEV6FVC-%Pred-Pre: 104 %
FVC-%Change-Post: -2 %
FVC-%Pred-Post: 107 %
FVC-%Pred-Pre: 110 %
FVC-Post: 2.09 L
FVC-Pre: 2.14 L
Post FEV1/FVC ratio: 90 %
Post FEV6/FVC ratio: 100 %
Pre FEV1/FVC ratio: 90 %
Pre FEV6/FVC Ratio: 100 %
RV % pred: 63 %
RV: 1.26 L
TLC % pred: 81 %
TLC: 3.64 L

## 2021-10-08 NOTE — Telephone Encounter (Signed)
3 months with repeat spiro/dlco in 3 months with me.   Thanks  MR

## 2021-10-08 NOTE — Telephone Encounter (Signed)
Irving Burton can you please let patient know that MR wanted to see her again in three months with repeat spiro/DLCO. I will change PFT order to reflect that, needs apt changed from 6 months to 3 months

## 2021-10-08 NOTE — Progress Notes (Signed)
  ID: Chelsea Callahan, female    DOB: 11-May-1950, 71 y.o.   MRN: [id number]  Chief Complaint  Patient presents with   Follow-up    F/u after PFT.    Referring provider: Elenore Paddy, NP  HPI: 71 year old female, former smoker quit 1999.  Past medical history significant for rheumatoid arthritis, Raynaud's, autoimmune disease, ILD.  Patient of Dr. Marchelle Gearing.  Previous [facility] pulmonary encounter: 08/26/2021 -  Consult, Dr. Marchelle Gearing  Chief Complaint  Patient presents with   Consult    Referred for possible ILD   Chelsea Callahan 71 y.o. -referred by Dr Naida Sleight.  71 year old female.  She used to live in [place] and [place] and.  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  Then in 2013 she relocated to [facility] along with her husband.  This is for her retirement.  She tells me that approximately 13 years ago she developed Raynaud's symptoms.  She also started having some alopecia may be a year or 2 before that.  This then resulted in a positive ANA testing.  She then started following with a rheumatologist for 5 years before she relocated to [facility] and for the last 8 or 9 years has established care with Dr. Corliss Skains.  Her ANA here is also positive [see below].  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  She is followed periodically.  She is on Plaquenil.  She has arthritis but this is osteoarthritis.  She denies any scleroderma findings or rheumatoid arthritis findings of malar rash or oral ulcers or hematuria or other connective tissue disease findings beyond the Raynaud's.  During the course of routine follow-up this year she was discovered to have crackles.  The notes also confirmed this.  This resulted in a high-resolution CT chest that shows interstitial lung disease predominantly with a craniocaudal gradient.  Reticulation and some traction bronchiectasis but  no honeycombing.  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  Therefore she has been referred here.  She tells me that she prefers to be very aggressive with the treatment and management approach.  She has a life goal of living up to 90 years.  Currently she is able to walk 2 miles every 2 weeks.  She never stops.  She walks slowly with a cane.  She does not feel much short of breath.  Initially she has some chest tightness and this resolves.  Is not much of a cough.  Past medical history - Negative for any specific connective tissue disease.  Negative for hiatal hernia and acid reflux. - Positive for ANA + and low titer rheumatoid factor [discussion with Dr. Algis Downs today:: Undifferentiated connective tissue disease:] - She has had the COVID disease in January 2022.  She also had COVID-vaccine 4 shots.  Review of systems - Raynaud's present - Osteoarthritis present  Family history of pulmonary disease - Brother has myasthenia gravis but otherwise negative  Exposure history - Smoked from [age] although in between she stopped between 1979 and 1983.  3 to 5 cigarettes/day.  She did some marijuana as a teenager.  Otherwise no cocaine use or no intravenous drug use.  Home and Hobby details - [place]  She has lived there for 2 years.  Is a private home.  Detail organic antigen exposure history in this house is negative.  She is  never used feather pillow or down jackets.  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  Detail organic and inorganic antigen exposure history at work is negative  Pulmonary toxicity history - Denies any adverse medication intake   10/08/2021 Patient presents today for follow-up.  She has a history of undifferentiated connective tissue disease.  She follows with rheumatology.  HRCT imaging in March showed pulmonary parenchymal pattern of interstitial lung disease, findings are  categorized as probable UIP.  One antibody in myositis panel was positive.  Recently been placed on CellCept by Dr. Deanne Coffer with Rheumatology, awaiting results of hep panel before starting medication.  Pulmonary function testing today was normal.  Simple walk test without oxygen desaturation.  She is largely asymptomatic.  She walks on average 2 miles a day 5 times a week.  Only respiratory complaint is some dyspnea symptoms about halfway through her walk.  She has no significant shortness of breath, cough, chest tightness or wheezing.  Pulmonary function testing 10/08/2021 FVC 2.09 (107%), FEV1 1.87 (125%), ratio 90, TLC 81%, DLCOunc 102%  Simple office walk 185 feet x  3 laps goal with forehead probe 08/26/2021  10/08/2021   O2 used ra RA  Number laps completed C2 of 3 3  Comments about pace x normal  Resting Pulse Ox/HR 100% and 83/min 100% and 58/min  Final Pulse Ox/HR 100% and 130/min 100% and 133/min  Desaturated </= 88% no No  Desaturated <= 3% points no No  Got Tachycardic >/= 90/min yes Yes  Symptoms at end of test x x  Miscellaneous comments x x    Allergies  Allergen Reactions   Ginger Swelling    Stomach swells   Pork-Derived Products Other (See Comments)    UNSPECIFIED "PERSONAL REASONS"   Shellfish Allergy Other (See Comments)    UNSPECIFIED "Personal reasons/dietary"    Immunization History  Administered Date(s) Administered   PFIZER Comirnaty(Gray Top)Covid-19 Tri-Sucrose Vaccine 05/21/2019, 06/11/2019, 04/05/2020, 10/13/2020   PFIZER(Purple Top)SARS-COV-2 Vaccination 05/21/2019, 06/11/2019, 04/05/2020   Zoster Recombinat (Shingrix) 03/08/2018, 05/08/2018   Zoster, Unspecified 03/08/2018, 05/08/2018    Past Medical History:  Diagnosis Date   Anemia    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HCC)    Blood transfusion without reported diagnosis    Cataract    bil/ per pt   GERD (gastroesophageal reflux disease)    Primary osteoarthritis of right knee    Raynaud disease     Raynaud disease    Spinal headache    c-section   Vasculitis (HCC)    on prednisone -     Tobacco History: Social History   Tobacco Use  Smoking Status Former   Packs/day: 0.25   Years: 15.00   Total pack years: 3.75   Types: Cigarettes   Quit date: 1999   Years since quitting: 24.4   Passive exposure: Past  Smokeless Tobacco Never   Counseling given: Not Answered   Outpatient Medications Prior to Visit  Medication Sig Dispense Refill   ASPIRIN 81 PO Take by mouth daily.     Black Pepper-Turmeric (TURMERIC CURCUMIN) 08-998 MG CAPS Take 1,000 mg by mouth daily.     Cholecalciferol ([employer] D3) 25 MCG (1000 UT) capsule Take 1,000 Units by mouth daily.     Fe Bisgly-Succ-C-Thre-B12-FA (IRON-150 PO) Take daily by mouth.     hydroxychloroquine (PLAQUENIL) 200 MG tablet TAKE 1 TABLET BY MOUTH EVERY DAY 90 tablet 0   Multiple Vitamin (MULTIVITAMIN WITH MINERALS) TABS tablet  Take 1 tablet by mouth daily.     psyllium (METAMUCIL) 58.6 % powder Take 1 packet by mouth in the morning and at bedtime.     Facility-Administered Medications Prior to Visit  Medication Dose Route Frequency Provider Last Rate Last Admin   0.9 %  sodium chloride infusion  500 mL Intravenous Once Armbruster, Willaim Rayas, MD        Review of Systems  Review of Systems  Constitutional: Negative.   HENT: Negative.    Respiratory:  Negative for cough, chest tightness, shortness of breath and wheezing.   Cardiovascular: Negative.      Physical Exam  BP 120/60 (BP Location: Left Arm, Patient Position: Sitting, Cuff Size: Large)   Pulse 74   Temp 97.7 F (36.5 C) (Oral)   Ht 5' (1.524 m)   Wt 181 lb (82.1 kg)   SpO2 99%   BMI 35.35 kg/m  Physical Exam Constitutional:      Appearance: Normal appearance.  HENT:     Head: Normocephalic and atraumatic.     Mouth/Throat:     Mouth: Mucous membranes are moist.     Pharynx: Oropharynx is clear.  Cardiovascular:     Rate and Rhythm: Normal rate and regular  rhythm.  Pulmonary:     Effort: Pulmonary effort is normal.     Breath sounds: Rales present. No wheezing or rhonchi.  Musculoskeletal:     Cervical back: Normal range of motion and neck supple.  Skin:    General: Skin is warm and dry.  Neurological:     General: No focal deficit present.     Mental Status: She is alert and oriented to person, place, and time. Mental status is at baseline.  Psychiatric:        Callahan and Affect: Callahan normal.        Behavior: Behavior normal.        Thought Content: Thought content normal.        Judgment: Judgment normal.      Lab Results:  CBC    Component Value Date/Time   WBC 5.3 08/13/2021 1400   RBC 4.09 08/13/2021 1400   HGB 11.5 (L) 08/13/2021 1400   HCT 35.7 (L) 08/13/2021 1400   PLT 280.0 08/13/2021 1400   MCV 87.2 08/13/2021 1400   MCH 28.0 07/06/2021 1520   MCHC 32.2 08/13/2021 1400   RDW 14.2 08/13/2021 1400   LYMPHSABS 1,502 07/06/2021 1520   MONOABS 0.5 01/26/2021 1500   EOSABS 261 07/06/2021 1520   BASOSABS 52 07/06/2021 1520    BMET    Component Value Date/Time   NA 138 07/06/2021 1520   K 4.7 07/06/2021 1520   CL 101 07/06/2021 1520   CO2 33 (H) 07/06/2021 1520   GLUCOSE 79 07/06/2021 1520   BUN 15 07/06/2021 1520   CREATININE 0.78 07/06/2021 1520   CALCIUM 10.3 07/06/2021 1520   GFRNONAA >60 01/26/2021 1500   GFRNONAA 78 06/16/2020 1415   GFRAA 90 06/16/2020 1415    BNP No results found for: "BNP"  ProBNP No results found for: "PROBNP"  Imaging: No results found.   Assessment & Plan:   ILD (interstitial lung disease) (HCC) - History of undifferentiated connective tissue disease.  Following with rheumatology.  HRCT imaging in March showed pulmonary parenchymal pattern of interstitial lung disease, probable UIP.  Myositis panel was positive for 1 antibody.  She has recently been placed on CellCept, awaiting results of hep panel before starting medication.  Pulmonary function  testing today was normal.   Simple walk test completed today without oxygen desaturation.  Follow-up in 6 months. Plan to repeat pulmonary function testing in 1 year or sooner if needed.  40 mins spent on case: > 50% face to face with patient   Glenford Bayley, NP 10/08/2021

## 2021-10-08 NOTE — Telephone Encounter (Signed)
Are you ok with 6 month follow-up? Repeat PFTs in 1 year?

## 2021-10-08 NOTE — Telephone Encounter (Signed)
Called and spoke with pt and have scheduled her for a 3 month follow up having PFT prior. Nothing further needed.

## 2021-10-08 NOTE — Patient Instructions (Signed)
Pulmonary function test today and today was normal  Start immunosuppressant medication as recommended by Dr. Deanne Coffer  Orders Pulmonary function testing with [facility]CO in 1 year re: ILD   Follow-up 6 months with Dr. Marchelle Gearing

## 2021-10-08 NOTE — Assessment & Plan Note (Addendum)
-   History of undifferentiated connective tissue disease.  Following with rheumatology.  HRCT imaging in March showed pulmonary parenchymal pattern of interstitial lung disease, probable UIP.  Myositis panel was positive for 1 antibody.  She has recently been placed on CellCept, awaiting results of hep panel before starting medication.  Pulmonary function testing today was normal.  Simple walk test completed without oxygen desaturation.  Follow-up in 6 months. Plan to repeat pulmonary function testing in 1 year or sooner if needed.

## 2021-10-08 NOTE — Progress Notes (Signed)
Full PFT performed today. °

## 2021-10-08 NOTE — Patient Instructions (Signed)
Full PFT performed today. °

## 2021-10-12 ENCOUNTER — Ambulatory Visit: Payer: PRIVATE HEALTH INSURANCE | Admitting: Rheumatology

## 2021-10-12 DIAGNOSIS — R0989 Other specified symptoms and signs involving the circulatory and respiratory systems: Secondary | ICD-10-CM

## 2021-10-12 DIAGNOSIS — Z96641 Presence of right artificial hip joint: Secondary | ICD-10-CM

## 2021-10-12 DIAGNOSIS — M359 Systemic involvement of connective tissue, unspecified: Secondary | ICD-10-CM

## 2021-10-12 DIAGNOSIS — Z96653 Presence of artificial knee joint, bilateral: Secondary | ICD-10-CM

## 2021-10-12 DIAGNOSIS — Z79899 Other long term (current) drug therapy: Secondary | ICD-10-CM

## 2021-10-12 DIAGNOSIS — M19071 Primary osteoarthritis, right ankle and foot: Secondary | ICD-10-CM

## 2021-10-12 DIAGNOSIS — M8589 Other specified disorders of bone density and structure, multiple sites: Secondary | ICD-10-CM

## 2021-10-12 DIAGNOSIS — M47816 Spondylosis without myelopathy or radiculopathy, lumbar region: Secondary | ICD-10-CM

## 2021-10-12 DIAGNOSIS — Z9884 Bariatric surgery status: Secondary | ICD-10-CM

## 2021-10-13 ENCOUNTER — Ambulatory Visit (INDEPENDENT_AMBULATORY_CARE_PROVIDER_SITE_OTHER): Payer: Medicare Other | Admitting: Orthopaedic Surgery

## 2021-10-13 ENCOUNTER — Ambulatory Visit (INDEPENDENT_AMBULATORY_CARE_PROVIDER_SITE_OTHER): Payer: Medicare Other

## 2021-10-13 VITALS — BP 132/81 | HR 73

## 2021-10-13 DIAGNOSIS — M7061 Trochanteric bursitis, right hip: Secondary | ICD-10-CM

## 2021-10-13 DIAGNOSIS — M25551 Pain in right hip: Secondary | ICD-10-CM | POA: Diagnosis not present

## 2021-10-13 MED ORDER — LIDOCAINE HCL 1 % IJ SOLN
1.0000 mL | INTRAMUSCULAR | Status: AC | PRN
  Administered 2021-10-13: 1 mL

## 2021-10-13 MED ORDER — BUPIVACAINE HCL 0.25 % IJ SOLN
2.0000 mL | INTRAMUSCULAR | Status: AC | PRN
  Administered 2021-10-13: 2 mL via INTRA_ARTICULAR

## 2021-10-13 MED ORDER — METHYLPREDNISOLONE ACETATE 40 MG/ML IJ SUSP
40.0000 mg | INTRAMUSCULAR | Status: AC | PRN
  Administered 2021-10-13: 40 mg via INTRA_ARTICULAR

## 2021-10-13 NOTE — Progress Notes (Signed)
Office Visit Note   Patient: Chelsea Callahan           Date of Birth: Apr 24, 1951           MRN: [id number] Visit Date: 10/13/2021              Requested by: Ailene Ards, NP [place],  [place] [address] PCP: Ailene Ards, NP   Assessment & Plan: Visit Diagnoses:  1. Pain in right hip   2. Trochanteric bursitis, right hip     Plan: Discussed patient some this may be related to the L4-5 anterolisthesis from degenerative facets with some secondary trochanteric bursitis.  Trochanteric injection performed with good relief as she walked better and can get from sitting standing comfortably.  She can follow-up in 6 weeks.  Follow-Up Instructions: Return in about 6 weeks (around 11/24/2021).   Orders:  Orders Placed This Encounter  Procedures   Large Joint Inj: R greater trochanter   XR HIP UNILAT W OR W/O PELVIS 2-3 VIEWS RIGHT   No orders of the defined types were placed in this encounter.     Procedures: Large Joint Inj: R greater trochanter on 10/13/2021 9:46 AM Details: lateral approach Medications: 1 mL lidocaine 1 %; 2 mL bupivacaine 0.25 %; 40 mg methylPREDNISolone acetate 40 MG/ML      Clinical Data: No additional findings.   Subjective: Chief Complaint  Patient presents with   Right Hip - Pain    Hx total hip 20 years ago    HPI 71 year old female here with the right hip pain has been present for 3 to 4 months primarily bothers her when she transitions from sitting to standing.  When she is upright at spine it does not bother her when she has been sitting just changing.  She points over the trochanter.  She has had previous decompression L4-5 where she had grade 1.5 anterolisthesis stable with decompression.  Previous total hip arthroplasty done around [place] 20+ years ago with ceramic on poly it appears by x-ray.  Review of Systems all other systems noncontributory to HPI.   Objective: Vital Signs: BP 132/81   Pulse 73   Physical  Exam Constitutional:      Appearance: She is well-developed.  HENT:     Head: Normocephalic.     Right Ear: External ear normal.     Left Ear: External ear normal. There is no impacted cerumen.  Eyes:     Pupils: Pupils are equal, round, and reactive to light.  Neck:     Thyroid: No thyromegaly.     Trachea: No tracheal deviation.  Cardiovascular:     Rate and Rhythm: Normal rate.  Pulmonary:     Effort: Pulmonary effort is normal.  Abdominal:     Palpations: Abdomen is soft.  Musculoskeletal:     Cervical back: No rigidity.  Skin:    General: Skin is warm and dry.  Neurological:     Mental Status: She is alert and oriented to person, place, and time.  Psychiatric:        Behavior: Behavior normal.     Ortho Exam healed posterior incision for right total hip arthroplasty ,trochanteric palpation on the right reproduces some of her symptoms.  No abduction weakness negative Trendelenburg.  Well-healed lumbar incision.  Anterior tib gastrocsoleus is active.  Specialty Comments:  No specialty comments available.  Imaging: XR HIP UNILAT W OR W/O PELVIS 2-3 VIEWS RIGHT  Result Date: 10/13/2021 AP pelvis  showing both hips and frog-leg right hip demonstrates total hip arthroplasty on the right apparent ceramic ball on polyethylene.  No stem subsidence no significant resorption. Impression: Satisfactory right total hip arthroplasty without evidence of loosening or subsidence.    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   Axillary mass, right 09/24/2021   Anxiety 09/24/2021   Anemia 08/13/2021   Right leg swelling 08/13/2021   Annual physical exam 07/20/2020   Chronic pain of both knees 12/27/2017   Pain in left hand 12/27/2017   History of lumbar laminectomy for spinal cord decompression 12/27/2017   Refused pneumococcal  vaccination 06/28/2016   Lumbar stenosis 05/13/2016   High risk medication use 05/11/2016   History of total hip replacement, right 05/11/2016   Primary osteoarthritis of both feet 05/11/2016   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   Low back pain 03/09/2016   Spinal stenosis of lumbar region 03/09/2016   Osteoarthritis of right knee 05/27/2014   History of total knee replacement, bilateral 05/23/2014   History of total knee replacement 05/02/2014   Autoimmune disease (HCC) 09/24/2013   Rheumatoid arthritis (HCC) 09/24/2013   Osteoarthritis of left knee 09/13/2013    Class: Diagnosis of   Past Medical History:  Diagnosis Date   Anemia    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HCC)    Blood transfusion without reported diagnosis    Cataract    bil/ per pt   GERD (gastroesophageal reflux disease)    Primary osteoarthritis of right knee    Raynaud disease    Raynaud disease    Spinal headache    c-section   Vasculitis (HCC)    on prednisone -     Family History  Problem Relation Age of Onset   Cancer Brother    Multiple myeloma Brother    Colon cancer Neg Hx    Esophageal cancer Neg Hx    Rectal cancer Neg Hx    Stomach cancer Neg Hx     Past Surgical History:  Procedure Laterality Date   abdominal clip     to stomach post gastric bypass   AXILLARY LYMPH NODE BIOPSY Right 01/27/2021   Procedure: RIGHT AXILLARY LYMPH NODE BIOPSY;  Surgeon: Harriette Bouillon, MD;  Location: [facility];  Service: General;  Laterality: Right;   CESAREAN SECTION     x 2   GASTRIC BYPASS     HERNIA REPAIR  2010   inverted hermia   HIP ARTHROPLASTY     JOINT REPLACEMENT Right 2005   hip   JOINT REPLACEMENT Bilateral    knee    KNEE ARTHROPLASTY Left 09/13/2013   Procedure: LEFT COMPUTER ASSISTED TOTAL KNEE ARTHROPLASTY;  Surgeon: Eldred Manges, MD;  Location: [facility] OR;  Service: Orthopedics;  Laterality: Left;  Left Total Knee Arthroplasty, Computer  Assist   KNEE ARTHROPLASTY Right 05/23/2014   Procedure: COMPUTER ASSISTED TOTAL KNEE ARTHROPLASTY;  Surgeon: Eldred Manges, MD;  Location: [facility] OR;  Service: Orthopedics;  Laterality: Right;   LUMBAR LAMINECTOMY/DECOMPRESSION MICRODISCECTOMY N/A 05/13/2016   Procedure: L4-5 Decompression;  Surgeon: Eldred Manges, MD;  Location: [facility] OR;  Service: Orthopedics;  Laterality: N/A;   teeth etraction     TOTAL KNEE ARTHROPLASTY Right 05/22/2014   dr Ophelia Charter   TUBAL LIGATION  1981   UPPER GI ENDOSCOPY  Social History   Occupational History   Not on file  Tobacco Use   Smoking status: Former    Packs/day: 0.25    Years: 15.00    Total pack years: 3.75    Types: Cigarettes    Quit date: 1999    Years since quitting: 24.4    Passive exposure: Past   Smokeless tobacco: Never  Vaping Use   Vaping Use: Never used  Substance and Sexual Activity   Alcohol use: Yes    Alcohol/week: 4.0 - 6.0 standard drinks of alcohol    Types: 4 - 6 Glasses of wine per week    Comment: weekly    Drug use: No   Sexual activity: Not on file

## 2021-10-21 ENCOUNTER — Ambulatory Visit: Payer: PRIVATE HEALTH INSURANCE | Admitting: Internal Medicine

## 2021-10-22 ENCOUNTER — Telehealth: Payer: Self-pay | Admitting: Nurse Practitioner

## 2021-10-22 ENCOUNTER — Ambulatory Visit (INDEPENDENT_AMBULATORY_CARE_PROVIDER_SITE_OTHER): Payer: Medicare Other | Admitting: Nurse Practitioner

## 2021-10-22 VITALS — BP 134/76 | HR 93 | Temp 97.9°F | Ht 60.0 in | Wt 177.0 lb

## 2021-10-22 DIAGNOSIS — I7300 Raynaud's syndrome without gangrene: Secondary | ICD-10-CM | POA: Insufficient documentation

## 2021-10-22 DIAGNOSIS — M359 Systemic involvement of connective tissue, unspecified: Secondary | ICD-10-CM

## 2021-10-22 DIAGNOSIS — M1990 Unspecified osteoarthritis, unspecified site: Secondary | ICD-10-CM | POA: Insufficient documentation

## 2021-10-22 DIAGNOSIS — D649 Anemia, unspecified: Secondary | ICD-10-CM

## 2021-10-22 NOTE — Assessment & Plan Note (Signed)
Chronic, being followed by rheumatology and pulmonology.  Patient to continue CellCept and follow-up for pulmonary function testing in 1 year as recommended by pulmonologist.

## 2021-10-25 ENCOUNTER — Encounter (HOSPITAL_COMMUNITY): Payer: Self-pay | Admitting: Internal Medicine

## 2021-10-25 ENCOUNTER — Ambulatory Visit (HOSPITAL_COMMUNITY)
Admission: RE | Admit: 2021-10-25 | Discharge: 2021-10-25 | Disposition: A | Discharge status: Home / Self Care | Payer: Medicare Other | Source: Ambulatory Visit | Attending: Internal Medicine | Admitting: Internal Medicine

## 2021-10-25 ENCOUNTER — Other Ambulatory Visit (HOSPITAL_COMMUNITY): Payer: Self-pay

## 2021-10-25 ENCOUNTER — Ambulatory Visit (HOSPITAL_BASED_OUTPATIENT_CLINIC_OR_DEPARTMENT_OTHER)
Admission: RE | Admit: 2021-10-25 | Discharge: 2021-10-25 | Disposition: A | Discharge status: Home / Self Care | Payer: Medicare Other | Source: Ambulatory Visit | Attending: Internal Medicine | Admitting: Internal Medicine

## 2021-10-25 VITALS — BP 130/80 | HR 59 | Wt 176.6 lb

## 2021-10-25 DIAGNOSIS — I272 Pulmonary hypertension, unspecified: Secondary | ICD-10-CM | POA: Diagnosis not present

## 2021-10-25 DIAGNOSIS — M359 Systemic involvement of connective tissue, unspecified: Secondary | ICD-10-CM | POA: Diagnosis not present

## 2021-10-25 DIAGNOSIS — E669 Obesity, unspecified: Secondary | ICD-10-CM | POA: Insufficient documentation

## 2021-10-25 DIAGNOSIS — I340 Nonrheumatic mitral (valve) insufficiency: Secondary | ICD-10-CM | POA: Insufficient documentation

## 2021-10-25 DIAGNOSIS — Z87891 Personal history of nicotine dependence: Secondary | ICD-10-CM | POA: Diagnosis not present

## 2021-10-25 DIAGNOSIS — M1990 Unspecified osteoarthritis, unspecified site: Secondary | ICD-10-CM | POA: Insufficient documentation

## 2021-10-25 DIAGNOSIS — J849 Interstitial pulmonary disease, unspecified: Secondary | ICD-10-CM | POA: Insufficient documentation

## 2021-10-25 LAB — ECHOCARDIOGRAM COMPLETE
Area-P 1/2: 3.5 cm2
Calc EF: 66.5 %
S' Lateral: 2.9 cm
Single Plane A2C EF: 71.2 %
Single Plane A4C EF: 59.7 %

## 2021-10-25 NOTE — H&P (View-Only) (Signed)
ADVANCED HF CLINIC NOTE  Referring Physician: Dr. Bo Merino- Primary Care: Ailene Ards, NP Primary HF Cardiologist: New Rheumatologist: Dr Aryl   HPI: Chelsea Callahan is a 71 y.o. obese female with history of autoimmune disease (positive ANA 1:1,280)and osteoarthritis. She has intermittent symptoms of raynaud's but denies any digital ulcerations. She was previously follopwed by Dr. Estanislado Pandy.   Had hi-res CT chest 3/23 suggestive of ILD with enlarged pulmonary arteries. Esophagus normal   Had recent LE u/s no DVT.  Was a "light" smoker (6-7 cigs/day) for 15 years quit 1999. No h/o DVT or PE. No diet drugs .  Home sleep study- negative.   Started on cellcept 2 weeks ago.   Today she returns for HF follow up.Overall feeling fine. Denies SOB/PND/Orthopnea. Appetite ok. No fever or chills.  Taking all medications.   Past Medical History:  Diagnosis Date   Anemia    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HCC)    Blood transfusion without reported diagnosis    Cataract    bil/ per pt   GERD (gastroesophageal reflux disease)    Primary osteoarthritis of right knee    Raynaud disease    Raynaud disease    Spinal headache    c-section   Vasculitis (HCC)    on prednisone -     Current Outpatient Medications  Medication Sig Dispense Refill   ASPIRIN 81 PO Take by mouth daily.     Black Pepper-Turmeric (TURMERIC CURCUMIN) 08-998 MG CAPS Take 1,000 mg by mouth daily.     Cholecalciferol ([employer] D3) 25 MCG (1000 UT) capsule Take 1,000 Units by mouth daily.     Fe Bisgly-Succ-C-Thre-B12-FA (IRON-150 PO) Take daily by mouth.     hydroxychloroquine (PLAQUENIL) 200 MG tablet TAKE 1 TABLET BY MOUTH EVERY DAY 90 tablet 0   Multiple Vitamin (MULTIVITAMIN WITH MINERALS) TABS tablet Take 1 tablet by mouth daily.     mycophenolate (CELLCEPT) 500 MG tablet Take 500 mg by mouth 2 (two) times daily.     psyllium (METAMUCIL) 58.6 % packet Take 1 packet by mouth as needed.     Current  Facility-Administered Medications  Medication Dose Route Frequency Provider Last Rate Last Admin   0.9 %  sodium chloride infusion  500 mL Intravenous Once Armbruster, Carlota Raspberry, MD        Allergies  Allergen Reactions   Ginger Swelling and Other (See Comments)    Stomach swells   Pork-Derived Products Other (See Comments)    UNSPECIFIED "PERSONAL REASONS"   Shellfish Allergy Other (See Comments)    UNSPECIFIED "Personal reasons/dietary"      Social History   Socioeconomic History   Marital status: Married    Spouse name: Not on file   Number of children: Not on file   Years of education: Not on file   Highest education level: Not on file  Occupational History   Not on file  Tobacco Use   Smoking status: Former    Packs/day: 0.25    Years: 15.00    Total pack years: 3.75    Types: Cigarettes    Quit date: 1999    Years since quitting: 24.4    Passive exposure: Past   Smokeless tobacco: Never  Vaping Use   Vaping Use: Never used  Substance and Sexual Activity   Alcohol use: Yes    Alcohol/week: 4.0 - 6.0 standard drinks of alcohol    Types: 4 - 6 Glasses of wine per week    Comment: weekly  Drug use: No   Sexual activity: Not on file  Other Topics Concern   Not on file  Social History Narrative   Not on file   Social Determinants of Health   Financial Resource Strain: Not on file  Food Insecurity: Not on file  Transportation Needs: Not on file  Physical Activity: Not on file  Stress: Not on file  Social Connections: Not on file  Intimate Partner Violence: Not on file      Family History  Problem Relation Age of Onset   Cancer Brother    Multiple myeloma Brother    Colon cancer Neg Hx    Esophageal cancer Neg Hx    Rectal cancer Neg Hx    Stomach cancer Neg Hx     Vitals:   10/25/21 1354  BP: 130/80  Pulse: (!) 59  SpO2: 100%  Weight: 80.1 kg (176 lb 9.6 oz)     PHYSICAL EXAM: General:  Well appearing. No resp difficulty HEENT:  normal Neck: supple. no JVD. Carotids 2+ bilat; no bruits. No lymphadenopathy or thryomegaly appreciated. Cor: PMI nondisplaced. Regular rate & rhythm. No rubs, gallops or murmurs. Lungs: clear Abdomen: soft, nontender, nondistended. No hepatosplenomegaly. No bruits or masses. Good bowel sounds. Extremities: no cyanosis, clubbing, rash, edema Neuro: alert & orientedx3, cranial nerves grossly intact. moves all 4 extremities w/o difficulty. Affect pleasant     ASSESSMENT & PLAN:   1. Autoimmune disease with dilated pulmonary artery on CT suggestive of possible PAH - discussed possible diagnosis of pulmonary HTN though if present seems quite mild - On cellcept per Dr Aryl.  - Echo today 10/25/21  EF  60-65% Grade II DD. RV normal. RVSP 40.  - Sleep study negative for OSA  2. ILD -started on cellcept  2 weeks ago.  - has f/u with Dr. Katrinka Blazing, NP  2:05 PM  Patient seen and examined with the above-signed Advanced Practice Provider and/or Housestaff. I personally reviewed laboratory data, imaging studies and relevant notes. I independently examined the patient and formulated the important aspects of the plan. I have edited the note to reflect any of my changes or salient points. I have personally discussed the plan with the patient and/or family.  Doing well NYHA I-II volume status looks good. Echo today reviewed shows mildly elevated filling PA pressures in setting of diastolic dyfunction. No evidence of RV strain   General:  Well appearing. No resp difficulty HEENT: normal Neck: supple. no JVD. Carotids 2+ bilat; no bruits. No lymphadenopathy or thryomegaly appreciated. Cor: PMI nondisplaced. Regular rate & rhythm. No rubs, gallops or murmurs. Lungs: clear Abdomen: obese soft, nontender, nondistended. No hepatosplenomegaly. No bruits or masses. Good bowel sounds. Extremities: no cyanosis, clubbing, rash, edema Neuro: alert & orientedx3, cranial nerves grossly intact.  moves all 4 extremities w/o difficulty. Affect pleasant  Overall doing well. Given evidence of increased pulmonary pressures on echo will proceed with RHC to definitively assess for pulmonary HTN.  Glori Bickers, MD  4:25 PM

## 2021-10-27 ENCOUNTER — Ambulatory Visit (HOSPITAL_COMMUNITY)
Admission: RE | Admit: 2021-10-27 | Discharge: 2021-10-27 | Disposition: A | Discharge status: Home / Self Care | Payer: Medicare Other | Attending: Internal Medicine | Admitting: Internal Medicine

## 2021-10-27 ENCOUNTER — Other Ambulatory Visit: Payer: Self-pay

## 2021-10-27 ENCOUNTER — Other Ambulatory Visit (HOSPITAL_COMMUNITY): Payer: Self-pay

## 2021-10-27 ENCOUNTER — Encounter (HOSPITAL_COMMUNITY)
Admission: RE | Disposition: A | Discharge status: Home / Self Care | Payer: Self-pay | Source: Home / Self Care | Attending: Internal Medicine

## 2021-10-27 DIAGNOSIS — Z6832 Body mass index (BMI) 32.0-32.9, adult: Secondary | ICD-10-CM | POA: Insufficient documentation

## 2021-10-27 DIAGNOSIS — J849 Interstitial pulmonary disease, unspecified: Secondary | ICD-10-CM | POA: Insufficient documentation

## 2021-10-27 DIAGNOSIS — Z87891 Personal history of nicotine dependence: Secondary | ICD-10-CM | POA: Insufficient documentation

## 2021-10-27 DIAGNOSIS — Z79624 Long term (current) use of inhibitors of nucleotide synthesis: Secondary | ICD-10-CM | POA: Diagnosis not present

## 2021-10-27 DIAGNOSIS — I2721 Secondary pulmonary arterial hypertension: Secondary | ICD-10-CM

## 2021-10-27 DIAGNOSIS — E669 Obesity, unspecified: Secondary | ICD-10-CM | POA: Insufficient documentation

## 2021-10-27 DIAGNOSIS — M359 Systemic involvement of connective tissue, unspecified: Secondary | ICD-10-CM | POA: Insufficient documentation

## 2021-10-27 DIAGNOSIS — I272 Pulmonary hypertension, unspecified: Secondary | ICD-10-CM

## 2021-10-27 HISTORY — PX: RIGHT HEART CATH: CATH118263

## 2021-10-27 LAB — CBC
HCT: 42.8 % (ref 36.0–46.0)
Hemoglobin: 12.6 g/dL (ref 12.0–15.0)
MCH: 27.5 pg (ref 26.0–34.0)
MCHC: 29.4 g/dL — ABNORMAL LOW (ref 30.0–36.0)
MCV: 93.4 fL (ref 80.0–100.0)
Platelets: 218 10*3/uL (ref 150–400)
RBC: 4.58 MIL/uL (ref 3.87–5.11)
RDW: 15.1 % (ref 11.5–15.5)
WBC: 6.7 10*3/uL (ref 4.0–10.5)
nRBC: 0 % (ref 0.0–0.2)

## 2021-10-27 LAB — POCT I-STAT EG7
Acid-Base Excess: 0 mmol/L (ref 0.0–2.0)
Acid-base deficit: 1 mmol/L (ref 0.0–2.0)
Acid-base deficit: 4 mmol/L — ABNORMAL HIGH (ref 0.0–2.0)
Bicarbonate: 24 mmol/L (ref 20.0–28.0)
Bicarbonate: 25 mmol/L (ref 20.0–28.0)
Bicarbonate: 21.1 mmol/L (ref 20.0–28.0)
Calcium, Ion: 1.33 mmol/L (ref 1.15–1.40)
Calcium, Ion: 1.4 mmol/L (ref 1.15–1.40)
Calcium, Ion: 1.24 mmol/L (ref 1.15–1.40)
HCT: 32 % — ABNORMAL LOW (ref 36.0–46.0)
HCT: 33 % — ABNORMAL LOW (ref 36.0–46.0)
HCT: 29 % — ABNORMAL LOW (ref 36.0–46.0)
Hemoglobin: 10.9 g/dL — ABNORMAL LOW (ref 12.0–15.0)
Hemoglobin: 11.2 g/dL — ABNORMAL LOW (ref 12.0–15.0)
Hemoglobin: 9.9 g/dL — ABNORMAL LOW (ref 12.0–15.0)
O2 Saturation: 70 %
O2 Saturation: 71 %
O2 Saturation: 66 %
Potassium: 3.8 mmol/L (ref 3.5–5.1)
Potassium: 4.1 mmol/L (ref 3.5–5.1)
Potassium: 3.3 mmol/L — ABNORMAL LOW (ref 3.5–5.1)
Sodium: 140 mmol/L (ref 135–145)
Sodium: 142 mmol/L (ref 135–145)
Sodium: 144 mmol/L (ref 135–145)
TCO2: 25 mmol/L (ref 22–32)
TCO2: 26 mmol/L (ref 22–32)
TCO2: 22 mmol/L (ref 22–32)
pCO2, Ven: 39.9 mmHg — ABNORMAL LOW (ref 44–60)
pCO2, Ven: 41.9 mmHg — ABNORMAL LOW (ref 44–60)
pCO2, Ven: 36.8 mmHg — ABNORMAL LOW (ref 44–60)
pH, Ven: 7.382 (ref 7.25–7.43)
pH, Ven: 7.387 (ref 7.25–7.43)
pH, Ven: 7.366 (ref 7.25–7.43)
pO2, Ven: 37 mmHg (ref 32–45)
pO2, Ven: 37 mmHg (ref 32–45)
pO2, Ven: 35 mmHg (ref 32–45)

## 2021-10-27 LAB — BASIC METABOLIC PANEL
Anion gap: 11 (ref 5–15)
BUN: 15 mg/dL (ref 8–23)
CO2: 25 mmol/L (ref 22–32)
Calcium: 10.1 mg/dL (ref 8.9–10.3)
Chloride: 105 mmol/L (ref 98–111)
Creatinine, Ser: 0.82 mg/dL (ref 0.44–1.00)
GFR, Estimated: >60 mL/min (ref >60)
Glucose, Bld: 83 mg/dL (ref 70–99)
Potassium: 4.4 mmol/L (ref 3.5–5.1)
Sodium: 141 mmol/L (ref 135–145)

## 2021-10-27 SURGERY — RIGHT HEART CATH
Anesthesia: LOCAL

## 2021-10-27 MED ORDER — HYDRALAZINE HCL 20 MG/ML IJ SOLN: 10.0000 mg | INTRAMUSCULAR | Status: DC | PRN

## 2021-10-27 MED ORDER — SODIUM CHLORIDE 0.9% FLUSH: 3.0000 mL | INTRAVENOUS | Status: DC | PRN

## 2021-10-27 MED ORDER — LABETALOL HCL 5 MG/ML IV SOLN: 10.0000 mg | INTRAVENOUS | Status: DC | PRN

## 2021-10-27 MED ORDER — SODIUM CHLORIDE 0.9% FLUSH: 3.0000 mL | Freq: Two times a day (BID) | INTRAVENOUS | Status: DC

## 2021-10-27 MED ORDER — LIDOCAINE HCL (PF) 1 % IJ SOLN
INTRAMUSCULAR | Status: AC
  Filled 2021-10-27: qty 30

## 2021-10-27 MED ORDER — ACETAMINOPHEN 325 MG PO TABS: 650.0000 mg | ORAL_TABLET | ORAL | Status: DC | PRN

## 2021-10-27 MED ORDER — SODIUM CHLORIDE 0.9 % IV SOLN: 250.0000 mL | INTRAVENOUS | Status: DC | PRN

## 2021-10-27 MED ORDER — SODIUM CHLORIDE 0.9 % IV SOLN: INTRAVENOUS | Status: DC

## 2021-10-27 MED ORDER — LIDOCAINE HCL (PF) 1 % IJ SOLN
INTRAMUSCULAR | Status: DC | PRN
  Administered 2021-10-27: 2 mL via SUBCUTANEOUS

## 2021-10-27 MED ORDER — ONDANSETRON HCL 4 MG/2ML IJ SOLN: 4.0000 mg | Freq: Four times a day (QID) | INTRAMUSCULAR | Status: DC | PRN

## 2021-10-27 SURGICAL SUPPLY — 6 items
CATH BALLN WEDGE 5F 110CM (CATHETERS) ×1 IMPLANT
PACK CARDIAC CATHETERIZATION (CUSTOM PROCEDURE TRAY) ×2 IMPLANT
PROTECTION STATION PRESSURIZED (MISCELLANEOUS) ×2
SHEATH GLIDE SLENDER 4/5FR (SHEATH) ×1 IMPLANT
STATION PROTECTION PRESSURIZED (MISCELLANEOUS) IMPLANT
TRANSDUCER W/STOPCOCK (MISCELLANEOUS) ×2 IMPLANT

## 2021-10-27 NOTE — Interval H&P Note (Signed)
History and Physical Interval Note:  10/27/2021 1:04 PM  Chelsea Callahan  has presented today for surgery, with the diagnosis of ILD.  The various methods of treatment have been discussed with the patient and family. After consideration of risks, benefits and other options for treatment, the patient has consented to  Procedure(s): RIGHT HEART CATH (N/A) as a surgical intervention.  The patient's history has been reviewed, patient examined, no change in status, stable for surgery.  I have reviewed the patient's chart and labs.  Questions were answered to the patient's satisfaction.     Konrad Hoak

## 2021-10-27 NOTE — Progress Notes (Signed)
Pt ambulated without difficulty or bleeding.   Discharged home with husband who will drive and stay with pt x 24 hrs  

## 2021-10-28 ENCOUNTER — Encounter (HOSPITAL_COMMUNITY): Payer: Self-pay | Admitting: Internal Medicine

## 2021-10-28 ENCOUNTER — Other Ambulatory Visit (HOSPITAL_COMMUNITY): Payer: Self-pay

## 2021-10-29 ENCOUNTER — Telehealth (HOSPITAL_COMMUNITY): Payer: Self-pay | Admitting: *Deleted

## 2021-10-29 ENCOUNTER — Other Ambulatory Visit (HOSPITAL_COMMUNITY): Payer: Self-pay | Admitting: *Deleted

## 2021-10-29 MED ORDER — SILDENAFIL CITRATE 20 MG PO TABS: 20.0000 mg | ORAL_TABLET | Freq: Three times a day (TID) | ORAL | 3 refills | Status: DC

## 2021-10-29 NOTE — Telephone Encounter (Signed)
Pt left vm stating she was told by Dr.Bensimhon to contact our office if she had not heard from him a few days after her cath. Per pt Dr.Bensimhon was supposed to prescribe her a new medication.   Message routed to Dr.Bensimhon

## 2021-10-29 NOTE — Telephone Encounter (Signed)
Script sent  

## 2021-11-03 ENCOUNTER — Other Ambulatory Visit (HOSPITAL_COMMUNITY): Payer: Self-pay

## 2021-11-08 DIAGNOSIS — R768 Other specified abnormal immunological findings in serum: Secondary | ICD-10-CM | POA: Diagnosis not present

## 2021-11-09 ENCOUNTER — Other Ambulatory Visit: Payer: Self-pay | Admitting: Nurse Practitioner

## 2021-11-09 ENCOUNTER — Telehealth: Payer: Self-pay

## 2021-11-09 DIAGNOSIS — J849 Interstitial pulmonary disease, unspecified: Secondary | ICD-10-CM

## 2021-11-09 NOTE — Telephone Encounter (Signed)
Pt is requesting a PUL referral.  Dr. Marcelene Butte  Address: [address], [place], [place] [address] Phone: [phone] Fax: [phone]

## 2021-11-10 NOTE — Telephone Encounter (Signed)
I advised the pt that the referral was placed yesterday and that she Should be hearing from Dr. Jon Billings office soon.  FYI

## 2021-11-24 ENCOUNTER — Ambulatory Visit (INDEPENDENT_AMBULATORY_CARE_PROVIDER_SITE_OTHER): Payer: Medicare Other | Admitting: Orthopaedic Surgery

## 2021-11-24 VITALS — BP 130/60 | HR 66 | Temp 98.0°F

## 2021-11-24 DIAGNOSIS — Z9889 Other specified postprocedural states: Secondary | ICD-10-CM

## 2021-11-24 DIAGNOSIS — M7061 Trochanteric bursitis, right hip: Secondary | ICD-10-CM | POA: Diagnosis not present

## 2021-11-24 NOTE — Progress Notes (Signed)
Office Visit Note   Patient: Chelsea Callahan           Date of Birth: [birth year]           MRN: [id number] Visit Date: 11/24/2021              Requested by: Ailene Ards, NP [place],  [place] [address] PCP: Ailene Ards, NP   Assessment & Plan: Visit Diagnoses:  1. Trochanteric bursitis, right hip   2. History of lumbar laminectomy for spinal cord decompression     Plan: Patient is gotten good relief with the injection.  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  We discussed this likely is related to the L4-5 trace anterolisthesis where she had previous decompression surgery.  No nerve root tension signs present and she can follow-up on an as-needed basis.  Follow-Up Instructions: Return if symptoms worsen or fail to improve.   Orders:  No orders of the defined types were placed in this encounter.  No orders of the defined types were placed in this encounter.     Procedures: No procedures performed   Clinical Data: No additional findings.   Subjective: Chief Complaint  Patient presents with   Right Hip - Follow-up    S/p injection 10/13/21    HPI 71 year old female returns post trochanteric injection with about 70% pain relief.  She is 6 weeks postinjection.  She had previous total hip arthroplasty done from a posterior approach performed in [place].  L4-5 decompression by me 2018.  Patient is walking she states she did 2 miles already today.  No claudication symptoms no radicular symptoms down to her feet.  Review of Systems all the systems noncontributory to HPI.   Objective: Vital Signs: BP 130/60   Pulse 66   Temp 98 F (36.7 C)   Physical Exam Constitutional:      Appearance: She is well-developed.  HENT:     Head: Normocephalic.     Right Ear: External ear normal.     Left Ear: External ear normal. There is no impacted cerumen.  Eyes:     Pupils: Pupils are equal, round, and  reactive to light.  Neck:     Thyroid: No thyromegaly.     Trachea: No tracheal deviation.  Cardiovascular:     Rate and Rhythm: Normal rate.  Pulmonary:     Effort: Pulmonary effort is normal.  Abdominal:     Palpations: Abdomen is soft.  Musculoskeletal:     Cervical back: No rigidity.  Skin:    General: Skin is warm and dry.  Neurological:     Mental Status: She is alert and oriented to person, place, and time.  Psychiatric:        Behavior: Behavior normal.     Ortho Exam patient is amatory without limp negative Trendelenburg.  No pain with internal/external rotation of her hip.  No rash over exposed skin.  Good knee range of motion.  She gets from sitting to standing without use of her arms.  Specialty Comments:  No specialty comments available.  Imaging: No results found.   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   Axillary mass, right 09/24/2021   Anxiety 09/24/2021   Anemia  08/13/2021   Right leg swelling 08/13/2021   Annual physical exam 07/20/2020   Chronic pain of both knees 12/27/2017   Pain in left hand 12/27/2017   History of lumbar laminectomy for spinal cord decompression 12/27/2017   Refused pneumococcal vaccination 06/28/2016   Lumbar stenosis 05/13/2016   High risk medication use 05/11/2016   History of total hip replacement, right 05/11/2016   Primary osteoarthritis of both feet 05/11/2016   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   Low back pain 03/09/2016   Spinal stenosis of lumbar region 03/09/2016   Osteoarthritis of right knee 05/27/2014   History of total knee replacement, bilateral 05/23/2014   History of total knee replacement 05/02/2014   Autoimmune disease ([facility]) 09/24/2013   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[facility]) 09/24/2013   Osteoarthritis of left knee 09/13/2013    Class: Diagnosis of   Past Medical History:  Diagnosis Date   Anemia    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[facility])    Blood transfusion without reported diagnosis    Cataract    bil/ per pt   GERD (gastroesophageal reflux disease)    Primary osteoarthritis of right knee    Raynaud disease    Raynaud disease    Spinal headache    c-section   Vasculitis ([facility])    on prednisone -     Family History  Problem Relation Age of Onset   Cancer Brother    Multiple myeloma Brother    Colon cancer Neg Hx    Esophageal cancer Neg Hx    Rectal cancer Neg Hx    Stomach cancer Neg Hx     Past Surgical History:  Procedure Laterality Date   abdominal clip     to stomach post gastric bypass   AXILLARY LYMPH NODE BIOPSY Right 01/27/2021   Procedure: RIGHT AXILLARY LYMPH NODE BIOPSY;  Surgeon: Erroll Luna, MD;  Location: [facility];  Service: General;  Laterality: Right;   CESAREAN SECTION     x 2   GASTRIC BYPASS     HERNIA REPAIR  2010   inverted hermia   HIP ARTHROPLASTY     JOINT REPLACEMENT Right 2005   hip   JOINT REPLACEMENT Bilateral    knee    KNEE ARTHROPLASTY Left 09/13/2013   Procedure: LEFT COMPUTER ASSISTED TOTAL KNEE ARTHROPLASTY;  Surgeon: Marybelle Killings, MD;  Location: [facility];  Service: Orthopedics;  Laterality: Left;  Left Total Knee Arthroplasty, Computer Assist   KNEE ARTHROPLASTY Right 05/23/2014   Procedure: COMPUTER ASSISTED TOTAL KNEE ARTHROPLASTY;  Surgeon: Marybelle Killings, MD;  Location: [facility];  Service: Orthopedics;  Laterality: Right;   LUMBAR LAMINECTOMY/DECOMPRESSION MICRODISCECTOMY N/A 05/13/2016   Procedure: L4-5 Decompression;  Surgeon: Marybelle Killings, MD;  Location: [facility];  Service: Orthopedics;  Laterality: N/A;   RIGHT HEART CATH N/A 10/27/2021   Procedure: RIGHT HEART CATH;  Surgeon: Jolaine Artist, MD;  Location: [facility];  Service: Cardiovascular;  Laterality: N/A;    teeth etraction     TOTAL KNEE ARTHROPLASTY Right 05/22/2014   dr Lorin [facility]   TUBAL LIGATION  1981   UPPER GI ENDOSCOPY     Social History   Occupational History   Not on file  Tobacco Use   Smoking status: Former    Packs/day: 0.25    Years: 15.00    Total pack years: 3.75    Types: Cigarettes    Quit date: 1999    Years  since quitting: 24.5    Passive exposure: Past   Smokeless tobacco: Never  Vaping Use   Vaping Use: Never used  Substance and Sexual Activity   Alcohol use: Yes    Alcohol/week: 4.0 - 6.0 standard drinks of alcohol    Types: 4 - 6 Glasses of wine per week    Comment: weekly    Drug use: No   Sexual activity: Not on file

## 2021-11-26 ENCOUNTER — Telehealth: Payer: Self-pay | Admitting: Internal Medicine

## 2021-11-26 NOTE — Telephone Encounter (Signed)
Printed off last 2 office notes and faxed them to number provided. Nothing further needed

## 2021-12-08 DIAGNOSIS — I7300 Raynaud's syndrome without gangrene: Secondary | ICD-10-CM | POA: Diagnosis not present

## 2021-12-08 DIAGNOSIS — M1990 Unspecified osteoarthritis, unspecified site: Secondary | ICD-10-CM | POA: Diagnosis not present

## 2021-12-08 DIAGNOSIS — M359 Systemic involvement of connective tissue, unspecified: Secondary | ICD-10-CM | POA: Diagnosis not present

## 2021-12-08 DIAGNOSIS — R768 Other specified abnormal immunological findings in serum: Secondary | ICD-10-CM | POA: Diagnosis not present

## 2021-12-08 DIAGNOSIS — J849 Interstitial pulmonary disease, unspecified: Secondary | ICD-10-CM | POA: Diagnosis not present

## 2021-12-10 ENCOUNTER — Telehealth: Payer: Self-pay

## 2021-12-10 DIAGNOSIS — J849 Interstitial pulmonary disease, unspecified: Secondary | ICD-10-CM | POA: Diagnosis not present

## 2021-12-10 NOTE — Telephone Encounter (Signed)
Pt has stated the referral to Dr. Johnna Acosta was not sent to Dr. Langston Masker she is upset and is wanting her provider to call her.

## 2021-12-13 NOTE — Telephone Encounter (Signed)
Seems like encounter was open in error so closing encounter.  

## 2021-12-13 NOTE — Telephone Encounter (Signed)
Spoke with Patient to make her aware that referral was faxed and confirmed receiving it on [facility] end. [facility] should call patient to schedule an appointment in a few days. Patient will contact me if she has not heard from them for me to follow up.

## 2021-12-29 ENCOUNTER — Other Ambulatory Visit: Payer: Self-pay | Admitting: Physician Assistant

## 2021-12-29 NOTE — Telephone Encounter (Signed)
Please call patient to schedule appt. Thank you.  Return in 3 months (on 10/06/2021) for Autoimmune Disease, Osteoarthritis

## 2021-12-29 NOTE — Telephone Encounter (Signed)
Patient declined scheduling an appointment stating she is no longer a patient of Dr. Corliss Skains.  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

## 2021-12-29 NOTE — Telephone Encounter (Signed)
Next Visit: Message sent to front desk to schedule appt, Return in 3 months (on 10/06/2021) for Autoimmune Disease, Osteoarthritis  Last Visit: 07/06/2021  Labs: 10/27/2021 MCHC 29.4,   Eye exam: 06/16/2021 WNL   Current Dose per office note 07/06/2021: Plaquenil 200 mg 1 tablet by mouth daily  [id number] disease   Last Fill: 10/01/2021  Okay to refill Plaquenil?

## 2021-12-29 NOTE — Telephone Encounter (Signed)
Patient will need to receive refills for plaquenil from new rheumatologist.

## 2022-01-25 ENCOUNTER — Encounter: Payer: Self-pay | Admitting: Internal Medicine

## 2022-01-25 ENCOUNTER — Ambulatory Visit (INDEPENDENT_AMBULATORY_CARE_PROVIDER_SITE_OTHER): Payer: Medicare Other | Admitting: Internal Medicine

## 2022-01-25 VITALS — BP 124/62 | HR 72 | Temp 98.3°F | Ht 62.0 in | Wt 174.0 lb

## 2022-01-25 DIAGNOSIS — M359 Systemic involvement of connective tissue, unspecified: Secondary | ICD-10-CM

## 2022-01-25 DIAGNOSIS — Z79899 Other long term (current) drug therapy: Secondary | ICD-10-CM

## 2022-01-25 DIAGNOSIS — J8489 Other specified interstitial pulmonary diseases: Secondary | ICD-10-CM | POA: Diagnosis not present

## 2022-01-25 DIAGNOSIS — R768 Other specified abnormal immunological findings in serum: Secondary | ICD-10-CM | POA: Diagnosis not present

## 2022-01-25 DIAGNOSIS — I7300 Raynaud's syndrome without gangrene: Secondary | ICD-10-CM | POA: Diagnosis not present

## 2022-01-25 DIAGNOSIS — D849 Immunodeficiency, unspecified: Secondary | ICD-10-CM

## 2022-01-25 DIAGNOSIS — I2721 Secondary pulmonary arterial hypertension: Secondary | ICD-10-CM

## 2022-01-25 DIAGNOSIS — Q998 Other specified chromosome abnormalities: Secondary | ICD-10-CM | POA: Diagnosis not present

## 2022-01-25 DIAGNOSIS — Z7185 Encounter for immunization safety counseling: Secondary | ICD-10-CM | POA: Diagnosis not present

## 2022-01-25 NOTE — Progress Notes (Addendum)
OV 08/26/2021 - new ILD [facility] ILD center. Referred by Dr Bo Merino  Subjective:  Patient ID: Chelsea Callahan, female , DOB: 03/20/1951 , age 71 y.o. , MRN: [id number] , ADDRESS: [place] [id number] PCP Ailene Ards, NP Patient Care Team: Ailene Ards, NP as PCP - General (Nurse Practitioner)  This Provider for this visit: Treatment Team:  Attending Provider: Brand Males, MD    08/26/2021 -   Chief Complaint  Patient presents with   Consult    Referred for possible ILD     HPI Chelsea Callahan 71 y.o. -referred by Dr Armanda Heritage.  71 year old female.  She used to live in [place] and [place] and.  She is a retired Optometrist.  She was a city Barista.  Then in 2013 she relocated to [facility] along with her husband.  This is for her retirement.  She tells me that approximately 13 years ago she developed Raynaud's symptoms.  She also started having some alopecia may be a year or 2 before that.  This then resulted in a positive ANA testing.  She then started following with a rheumatologist for 5 years before she relocated to [facility] and for the last 8 or 9 years has established care with Dr. Estanislado Pandy.  Her ANA here is also positive [see below].  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  She is followed periodically.  She is on Plaquenil.  She has arthritis but this is osteoarthritis.  She denies any scleroderma findings or rheumatoid arthritis findings of malar rash or oral ulcers or hematuria or other connective tissue disease findings beyond the Raynaud's.  During the course of routine follow-up this year she was discovered to have crackles.  The notes also confirmed this.  This resulted in a high-resolution CT chest that shows interstitial lung disease predominantly with a craniocaudal gradient.  Reticulation and some traction  bronchiectasis but no honeycombing.  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  Therefore she has been referred here.  She tells me that she prefers to be very aggressive with the treatment and management approach.  She has a life goal of living up to 80 years.  Currently she is able to walk 2 miles every 2 weeks.  She never stops.   She does not feel much short of breath.  Initially she has some chest tightness and this resolves.  Is not much of a cough.  Past medical history - Negative for any specific connective tissue disease.  Negative for hiatal hernia and acid reflux. - Positive for ANA + and low titer rheumatoid factor [discussion with Dr. Keturah Barre today:: Undifferentiated connective tissue disease:] - She has had the COVID disease in January 2022.  She also had COVID-vaccine 4 shots.  Review of systems - Raynaud's present - Osteoarthritis present  Family history of pulmonary disease - Brother has myasthenia gravis but otherwise negative  Exposure history - Smoked from [age] although in between she stopped between [place].  3 to 5 cigarettes/day.  She did some marijuana as a teenager.  Otherwise no cocaine use or no intravenous drug use.  Home and [place].  She has lived there for 2 years.  Is a private home.  Detail organic antigen exposure history in this house is negative.  She is never used feather pillow or down jackets.  Occupational history - [place] and city [employer].  Detail organic and inorganic antigen exposure history at work is negative  Pulmonary toxicity history - Denies any adverse medication intake    AUOTIMMUNE   Latest Reference Range & Units 10/17/16 12:14 08/17/17 14:55 03/07/18 14:48 07/03/19 11:32 06/16/20 14:15 11/13/20 00:00 07/06/21 15:20  Anti Nuclear Antibody (ANA) NEGATIVE        POSITIVE !  ANA Pattern 1        Nuclear, Nucleolar !  ANA Titer 1 titer       1:1,280 (H)  ds DNA Ab  IU/mL _0 ENA SM Ab Ser-aCnc <1.0 NEG AI       <1.0 NEG  !: Data is abnormal (H): Data is abnormally high  Latest Reference Range & Units 07/06/21 15:20  Ribonucleic Protein(ENA) Antibody, IgG <1.0 NEG AI <1.0 NEG  ENA SM Ab Ser-aCnc <1.0 NEG AI <1.0 NEG  SSA (Ro) (ENA) Antibody, IgG <1.0 NEG AI <1.0 NEG  SSB (La) (ENA) Antibody, IgG <1.0 NEG AI <1.0 NEG  Scleroderma (Scl-70) (ENA) Antibody, IgG <1.0 NEG AI <1.0 NEG    Latest Reference Range & Units 10/17/16 12:14 08/17/17 14:55 03/07/18 14:48 07/03/19 11:32 06/16/20 14:15 11/13/20 00:00 07/06/21 15:20  Sed Rate 0 - 30 mm/h  _1 Anticardiolipin Ab,IgA,Qn APL <11 <11       Anticardiolipin Ab,IgG,Qn GPL <14 <14       Anticardiolipin Ab,IgM,Qn MPL <12 <12        CT Chest data - HRCT March 2023  Narrative & Impression  CLINICAL DATA:  Autoimmune disease, crackles in chest.   EXAM: CT CHEST WITHOUT CONTRAST   TECHNIQUE: Multidetector CT imaging of the chest was performed following the standard protocol without intravenous contrast. High resolution imaging of the lungs, as well as inspiratory and expiratory imaging, was performed.   RADIATION DOSE REDUCTION: This exam was performed according to the departmental dose-optimization program which includes automated exposure control, adjustment of the mA and/or kV according to patient size and/or use of iterative reconstruction technique.   COMPARISON:  None.   FINDINGS: Cardiovascular: Atherosclerotic calcification of the aorta, aortic valve and coronary arteries. Enlarged right and left pulmonary arteries and heart. Small amount of pericardial fluid may be physiologic.   Mediastinum/Nodes: Mediastinal lymph nodes measure up to 10 mm in the AP window. Hilar regions are difficult to evaluate without IV contrast. Small axillary and subpectoral lymph nodes with the largest measuring 1.4 cm in the right axilla. Esophagus is grossly unremarkable.    Lungs/Pleura: Peripheral basilar subpleural reticulation, ground-glass and traction bronchiectasis/bronchiolectasis. No definitive honeycombing. Probable subpleural lymph nodes along the right major fissure, measuring up to 5 mm. Calcified granulomas. Noncalcified 5 mm lingular nodule (4 x 6 mm, 6/73). No pleural fluid. Airway is unremarkable. Mild air trapping.   Upper Abdomen: Visualized portion of the liver is unremarkable. Stone in the gallbladder. Visualized portions of the adrenal glands, kidneys, spleen and pancreas are grossly unremarkable. Gastric bypass.   Musculoskeletal: Degenerative changes in the spine. Old right rib fractures. No worrisome lytic or sclerotic lesions.   IMPRESSION: 1. Pulmonary parenchymal pattern of interstitial lung disease may be due to usual interstitial pneumonitis or nonspecific interstitial pneumonitis. Findings are categorized as probable UIP per consensus guidelines: Diagnosis of Idiopathic Pulmonary Fibrosis: An Official ATS/ERS/JRS/ALAT Clinical Practice Guideline. [place]  198, Iss 5, [id number], Dec 31 2016. 2. Small amount of pericardial fluid may be physiologic. 3. 5 mm lingular nodule. If the patient is high-risk, a non-contrast chest CT can be considered in 12 months.This recommendation follows the consensus statement: Guidelines for Management of Incidental Pulmonary Nodules Detected on CT Images: From the Fleischner Society 2017; Radiology 2017; 284:228-243. 4. Small to borderline enlarged mediastinal and axillary lymph nodes. Difficult to exclude a lymphoproliferative disorder. 5. Cholelithiasis. 6. Aortic atherosclerosis (ICD10-I70.0). Coronary artery calcification. 7. Enlarged pulmonary arteries, indicative of pulmonary arterial hypertension. 8.  Emphysema (ICD10-J43.9).     Electronically Signed   By: Lorin Picket M.D.   On: 07/19/2021 10:34         10/08/2021 Patient presents today for  follow-up.  She has a history of undifferentiated connective tissue disease.  She follows with rheumatology.  HRCT imaging in March showed pulmonary parenchymal pattern of interstitial lung disease, findings are categorized as probable UIP.  One antibody in myositis panel was positive.  Recently been placed on CellCept by Dr. Kathlene November with Rheumatology, awaiting results of hep panel before starting medication.  Pulmonary function testing today was normal.  Simple walk test without oxygen desaturation.  She is largely asymptomatic.  She walks on average 2 miles a day 5 times a week.  Only respiratory complaint is some dyspnea symptoms about halfway through her walk.  She has no significant shortness of breath, cough, chest tightness or wheezing.  Pulmonary function testing 10/08/2021 FVC 2.09 (107%), FEV1 1.87 (125%), ratio 90, TLC 81%, DLCOunc 102%  RHC 10/27/21  Findings:   RA = 8 RV = 45/11 PA = 45/13 (28) PCW = 17 Fick cardiac output/index = 5.2/2.9 PVR = 2.1 FA sat = 97% PA sat = 69%, 70% SVC sat = 66%   Assessment: 1. 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 Chase Caller. Likely start macitentan    Glori Bickers, MD  1:40 PM   OV 01/25/2022  Subjective:  Patient ID: Chelsea Callahan, female , DOB: 1950/10/07 , age 56 y.o. , MRN: [id number] , ADDRESS: [place] [id number] PCP Ailene Ards, NP Patient Care Team: Ailene Ards, NP as PCP - General (Nurse Practitioner)  This Provider for this visit: Treatment Team:  Attending Provider: Brand Males, MD    01/25/2022 -   Chief Complaint  Patient presents with   Follow-up    PFT performed 8/11 at [facility].  Pt states she has been doing okay since last visit and denies any complaints.   #Undifferentiated connective tissue disease  -Myositis antibody negative's 2023 `- Raynaud's + -ANA +2023 -On CellCept through Dr. 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  -Crackles on presentation  -Very mild burden.  Essentially asymptomatic normal PFTs 2023  #Mild pulmonary hypertension right heart cath June 2023 --Dr. Haroldine Laws  -Pulmonary capillary wedge pressure 17, PVR 2.1, PA mean 28  -Revatio started by Dr. Haroldine Laws fper history  #5 mm lingula nodule March 2023  HPI Quynh Basso 71 y.o. -returns for follow-up.  Since I last personally met her she has now been started on CellCept after switching rheumatologist to [facility] Dr. Kathlene November.  This is on account of positive ANA and also positive myositis antibody and Raynaud's.  She is also had a right heart catheterization.  She has been started on room audio.  She continues  to be very minimally symptomatic or asymptomatic from the shortness of breath cough and fatigue standpoint.  She she also wanted to get a second opinion for lung disease.  She is taken an appointment Dr. Wynn Maudlin at [facility].  She did have pulmonary function test in August 2023.  Some except for admit as documented below.  However when she went there they only had a pulmonary function test scheduled but not the office appointment.  The office appointment is now being scheduled for March 03, 2022 which is 3 months since the pulmonary function test.  Also at this time she is now on CellCept.  We took a shared decision making that she will have 1 more pulmonary function test it was the end of October 2023 to give a sequential timeline for Dr. Randol Kern to help with his decision making.  We discussed the fact that nintedanib would be the next agent and whether we will start it now or wait till if any progression.  This will be a question that would be post to Dr. Randol Kern.  The pulmonary function test data should help.  We discussed her respiratory vaccines but currently she wants to hold off.  Of note she has a 5 mm lingular nodule in March 2023.  She is a remote smoker.   We will get a repeat CT scan of the chest in spring or summer 2024.    SYMPTOM SCALE - ILD 01/25/2022 01/25/2022   Current weight    O2 use ra ra  Shortness of Breath 0 -> 5 scale with 5 being worst (score 6 If unable to do)   At rest 000 0  Simple tasks - showers, clothes change, eating, shaving 0 0  Household (dishes, doing bed, laundry) 0 0  Shopping 00 0  Walking level at own pace 0 0  Walking up Stairs 0   Total (30-36) Dyspnea Score 0 00  How bad is your cough? 0 0  How bad is your fatigue 0 0  How bad is nausea 0 0  How bad is vomiting?  0 0  How bad is diarrhea? 0 0  How bad is anxiety? 0 0  How bad is depression 2 2  Any chronic pain - if so where and how bad 1 1      Simple office walk 185 feet x  3 laps goal with forehead probe 08/26/2021  10/08/2021   O2 used ra RA  Number laps completed C2 of 3 3  Comments about pace x normal  Resting Pulse Ox/HR 100% and 83/min 100% and 58/min  Final Pulse Ox/HR 100% and 130/min 100% and 133/min  Desaturated </= 88% no No  Desaturated <= 3% points no No  Got Tachycardic >/= 90/min yes Yes  Symptoms at end of test x x  Miscellaneous comments x x   PFT     Latest Ref Rng & Units 10/08/2021   10:54 AM 12/10/2021  PFT Results dule  FVC-Pre L 2.14  2.29  FVC-Predicted Pre % 110    FVC-Post L 2.09    FVC-Predicted Post % 107    Pre FEV1/FVC % % 90    Post FEV1/FCV % % 90    FEV1-Pre L 1.93    FEV1-Predicted Pre % 129    FEV1-Post L 1.87    DLCO uncorrected ml/min/mmHg 17.27  12.38  DLCO UNC% % 102    DLCO corrected ml/min/mmHg 17.27    DLCO COR %Predicted % 102  DLVA Predicted % 104    TLC L 3.64    TLC % Predicted % 81    RV % Predicted % 63         has a past medical history of Anemia, 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   reports that she quit smoking about 24 years ago. Her smoking use included cigarettes. She has a 3.75 pack-year smoking history. She has been exposed to tobacco smoke. She has never used smokeless tobacco.  Past Surgical History:  Procedure Laterality Date   abdominal clip     to stomach post gastric bypass   AXILLARY LYMPH NODE BIOPSY Right 01/27/2021   Procedure: RIGHT AXILLARY LYMPH NODE BIOPSY;  Surgeon: Erroll Luna, MD;  Location: [place];  Service: General;  Laterality: Right;   CESAREAN SECTION     x 2   GASTRIC BYPASS     HERNIA REPAIR  2010   inverted hermia   HIP ARTHROPLASTY     JOINT REPLACEMENT Right 2005   hip   JOINT REPLACEMENT Bilateral    knee    KNEE ARTHROPLASTY Left 09/13/2013   Procedure: LEFT COMPUTER ASSISTED TOTAL KNEE ARTHROPLASTY;  Surgeon: Marybelle Killings, MD;  Location: [place];  Service: Orthopedics;  Laterality: Left;  Left Total Knee Arthroplasty, Computer Assist   KNEE ARTHROPLASTY Right 05/23/2014   Procedure: COMPUTER ASSISTED TOTAL KNEE ARTHROPLASTY;  Surgeon: Marybelle Killings, MD;  Location: [place];  Service: Orthopedics;  Laterality: Right;   LUMBAR LAMINECTOMY/DECOMPRESSION MICRODISCECTOMY N/A 05/13/2016   Procedure: L4-5 Decompression;  Surgeon: Marybelle Killings, MD;  Location: [place];  Service: Orthopedics;  Laterality: N/A;   RIGHT HEART CATH N/A 10/27/2021   Procedure: RIGHT HEART CATH;  Surgeon: Jolaine Artist, MD;  Location: [place] [facility];  Service: Cardiovascular;  Laterality: N/A;   teeth etraction     TOTAL KNEE ARTHROPLASTY Right 05/22/2014   dr Lorin Mercy   TUBAL LIGATION  1981   UPPER GI ENDOSCOPY      Allergies  Allergen Reactions   Ginger Swelling and Other (See Comments)    Stomach swells   Pork-Derived Products Other (See Comments)    UNSPECIFIED "PERSONAL REASONS"   Shellfish Allergy Other (See Comments)    UNSPECIFIED "Personal reasons/dietary"    Immunization History  Administered  Date(s) Administered   PFIZER Comirnaty(Gray Top)Covid-19 Tri-Sucrose Vaccine 05/21/2019, 06/11/2019, 04/05/2020, 10/13/2020   PFIZER(Purple Top)SARS-COV-2 Vaccination 05/21/2019, 06/11/2019, 04/05/2020   Zoster Recombinat (Shingrix) 03/08/2018, 05/08/2018   Zoster, Unspecified 03/08/2018, 05/08/2018    Family History  Problem Relation Age of Onset   Cancer Brother    Multiple myeloma Brother    Colon cancer Neg Hx    Esophageal cancer Neg Hx    Rectal cancer Neg Hx    Stomach cancer Neg Hx      Current Outpatient Medications:    aspirin EC 81 MG tablet, Take 81 mg by mouth daily. Swallow whole., Disp: , Rfl:    Black Pepper-Turmeric (TURMERIC CURCUMIN) 08-998 MG CAPS, Take 1,000 mg by mouth daily., Disp: , Rfl:    Cholecalciferol (CVS D3) 25 MCG (1000 UT) capsule, Take 1,000 Units by mouth daily., Disp: , Rfl:    Fe Bisgly-Succ-C-Thre-B12-FA (IRON-150 PO), Take 1 tablet by mouth daily., Disp: , Rfl:    hydroxychloroquine (PLAQUENIL) 200 MG tablet, TAKE 1 TABLET BY MOUTH  EVERY DAY, Disp: 90 tablet, Rfl: 0   Multiple Vitamin (MULTIVITAMIN WITH MINERALS) TABS tablet, Take 1 tablet by mouth daily., Disp: , Rfl:    mycophenolate (CELLCEPT) 500 MG tablet, Take 500 mg by mouth 2 (two) times daily., Disp: , Rfl:    OVER THE COUNTER MEDICATION, Take 1 capsule by mouth daily. [place], Disp: , Rfl:    psyllium (METAMUCIL) 58.6 % packet, Take 1 packet by mouth daily as needed (constipation)., Disp: , Rfl:    sildenafil (REVATIO) 20 MG tablet, Take 1 tablet (20 mg total) by mouth 3 (three) times daily., Disp: 90 tablet, Rfl: 3  Current Facility-Administered Medications:    0.9 %  sodium chloride infusion, 500 mL, Intravenous, Once, Armbruster, Carlota Raspberry, MD      Objective:   Vitals:   01/25/22 1034  BP: 124/62  Pulse: 72  Temp: 98.3 F (36.8 C)  TempSrc: Oral  SpO2: 100%  Weight: 174 lb (78.9 kg)  Height: _0  (1.575 m)    Estimated body mass index is 31.83 kg/m as calculated  from the following:   Height as of this encounter: _1  (1.575 m).   Weight as of this encounter: 174 lb (78.9 kg).  _2 @  Filed Weights   01/25/22 1034  Weight: 174 lb (78.9 kg)     Physical Exam    General: No distress. Looks well Neuro: Alert and Oriented x 3. GCS 15. Speech normal Psych: Pleasant Resp:  Barrel Chest - no.  Wheeze - no, Crackles - yes, No overt respiratory distress CVS: Normal heart sounds. Murmurs - no Ext: Stigmata of Connective Tissue Disease - no HEENT: Normal upper airway. PEERL +. No post nasal drip        Assessment:       ICD-10-CM   1. Interstitial lung disease due to connective tissue disease ([place])  J84.89    M35.9     2. Raynaud's disease without gangrene  I73.00     3. ANA positive  R76.8     4. 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 On Cellcept therapy  Z79.899     6. Immunosuppressed status ([place])  D84.9     7. WHO group 1 pulmonary arterial hypertension (HCC)  I27.21     8. Vaccine counseling  Z71.85          Plan:     Patient Instructions     ICD-10-CM   1. Interstitial lung disease due to connective tissue disease ([place])  J84.89    M35.9     2. Raynaud's disease without gangrene  I73.00     3. ANA positive  R76.8     4. 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 On Cellcept therapy  Z79.899     6. Immunosuppressed status ([place])  D84.9     7. Vaccine counseling  Z71.85      Interstitial lung disease due to connective tissue disease ([place]) Raynaud's disease without gangrene ANA positive Myositis associated antibody positive On Cellcept therapy Immunosuppressed status ([place])  - clinically stable  - tolerating well cellcept 561m twice daily since May/June 2023 via Dr AKathlene November Plan - continue cellcept via Dr AKathlene November  - do PFT 02/28/22 so [facility]  [facility]   - to discuss increaseing cellcept v add-on  ofev  Pulmonary Hypertension  - stable  on revatio  Plan  Conitnue revatio through Dr Jeffie Pollock  74m Lingular Nodule March 2023  Plan  - HRCT in spring/summer 2024  Vaccine counseling  - respect deferral flu, rsv and covid vaccine   Followup  - jan/feb 2024 which is 2-3 months after seeing DR MRandol Kern  - symptoms sscore and walk test at followup     SIGNATURE    Dr. MBrand Males M.D., F.C.C.P,  Pulmonary and Critical Care Medicine Staff Physician, CMagaliaDirector - Interstitial Lung Disease  Program  Pulmonary [place] [address] Pager: [phone] If no answer or between  15:00h - 7:00h: call [phone] Telephone: [phone]  5:39 PM 01/25/2022

## 2022-01-25 NOTE — Patient Instructions (Addendum)
ICD-10-CM   1. Interstitial lung disease due to connective tissue disease ([place])  J84.89    M35.9     2. Raynaud's disease without gangrene  I73.00     3. ANA positive  R76.8     4. 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 On Cellcept therapy  Z79.899     6. Immunosuppressed status ([place])  D84.9     7. Vaccine counseling  Z71.85      Interstitial lung disease due to connective tissue disease ([place]) Raynaud's disease without gangrene ANA positive Myositis associated antibody positive On Cellcept therapy Immunosuppressed status ([place])  - clinically stable  - tolerating well cellcept 500mg  twice daily since May/June 2023 via Dr Kathlene November  Plan - continue cellcept via Dr Kathlene November   - do PFT 02/28/22 so it can help dR Randol Kern at [facility] 11/2/223 visit  - keep 2nd opinion appt at [facility] 03/03/22;   - to discuss increaseing cellcept v add-on ofev  Pulmonary Hypertension  - stable  on revatio  Plan  Conitnue revatio through Dr Jeffie Pollock  4mm Lingular Nodule March 2023  Plan  - HRCT in spring/summer 2024  Vaccine counseling  - respect deferral flu, rsv and covid vaccine   Followup  - jan/feb 2024 which is 2-3 months after seeing DR Randol Kern   - symptoms sscore and walk test at followup

## 2022-01-28 ENCOUNTER — Encounter: Payer: Self-pay | Admitting: Nurse Practitioner

## 2022-01-28 ENCOUNTER — Ambulatory Visit (INDEPENDENT_AMBULATORY_CARE_PROVIDER_SITE_OTHER): Payer: Medicare Other | Admitting: Nurse Practitioner

## 2022-01-28 VITALS — BP 119/72 | HR 71 | Temp 98.0°F | Ht 62.0 in | Wt 174.4 lb

## 2022-01-28 DIAGNOSIS — M8580 Other specified disorders of bone density and structure, unspecified site: Secondary | ICD-10-CM

## 2022-01-28 DIAGNOSIS — D649 Anemia, unspecified: Secondary | ICD-10-CM

## 2022-01-28 DIAGNOSIS — Z9884 Bariatric surgery status: Secondary | ICD-10-CM

## 2022-01-28 DIAGNOSIS — Z13820 Encounter for screening for osteoporosis: Secondary | ICD-10-CM | POA: Diagnosis not present

## 2022-01-28 LAB — FERRITIN: Ferritin: 20.6 ng/mL (ref 10.0–291.0)

## 2022-01-28 LAB — CBC
HCT: 36.2 % (ref 36.0–46.0)
Hemoglobin: 11.7 g/dL — ABNORMAL LOW (ref 12.0–15.0)
MCHC: 32.5 g/dL (ref 30.0–36.0)
MCV: 86.5 fl (ref 78.0–100.0)
Platelets: 255 10*3/uL (ref 150.0–400.0)
RBC: 4.18 Mil/uL (ref 3.87–5.11)
RDW: 14.7 % (ref 11.5–15.5)
WBC: 5.5 10*3/uL (ref 4.0–10.5)

## 2022-01-28 LAB — BASIC METABOLIC PANEL
BUN: 15 mg/dL (ref 6–23)
CO2: 28 mEq/L (ref 19–32)
Calcium: 10.2 mg/dL (ref 8.4–10.5)
Chloride: 103 mEq/L (ref 96–112)
Creatinine, Ser: 0.77 mg/dL (ref 0.40–1.20)
GFR: 78.04 mL/min (ref >60.00)
Glucose, Bld: 70 mg/dL (ref 70–99)
Potassium: 4.2 mEq/L (ref 3.5–5.1)
Sodium: 139 mEq/L (ref 135–145)

## 2022-01-28 LAB — IRON: Iron: 95 ug/dL (ref 42–145)

## 2022-01-28 LAB — VITAMIN D 25 HYDROXY (VIT D DEFICIENCY, FRACTURES): VITD: 54.44 ng/mL (ref 30.00–100.00)

## 2022-01-28 LAB — VITAMIN B12: Vitamin B-12: 998 pg/mL — ABNORMAL HIGH (ref 211–911)

## 2022-01-28 NOTE — Progress Notes (Signed)
Established Patient Office Visit  Subjective   Patient ID: Chelsea Callahan, female    DOB: 29-Jan-1951  Age: 71 y.o. MRN: [id number]  Chief Complaint  Patient presents with   Follow-up   Anemia    Anemia: Patient underwent catheterization in the hospital a few months ago, she did have anemia during the hospitalization probably related to her procedure.  During discussion of this, she reports that she has had a history of anemia where her hemoglobin has dropped below 6 before.  She does take an iron supplement once a day currently.  She does not feel fatigued, short of breath, or experience any chest pain.  Osteopenia: Per chart review she had a DEXA scan in 2018 which did show evidence for osteopenia, she reports that she had a repeat DEXA scan in either 2020 or 2021, but is not sure what the results were.  IDL/pulmonary HTN: currently following with pulmonology, rheumatology, and cardiology.    Review of Systems  Constitutional:  Negative for malaise/fatigue.  Respiratory:  Negative for shortness of breath.   Cardiovascular:  Negative for chest pain.  Neurological:  Negative for dizziness.      Objective:     BP 119/72 (BP Location: Left Arm, Patient Position: Sitting, Cuff Size: Normal)   Pulse 71   Temp 98 F (36.7 C) (Oral)   Ht 5\' 2"  (1.575 m)   Wt 174 lb 6.4 oz (79.1 kg)   SpO2 99%   BMI 31.90 kg/m    Physical Exam Vitals reviewed.  Constitutional:      General: She is not in acute distress.    Appearance: Normal appearance.  HENT:     Head: Normocephalic and atraumatic.  Neck:     Vascular: No carotid bruit.  Cardiovascular:     Rate and Rhythm: Normal rate and regular rhythm.     Pulses: Normal pulses.     Heart sounds: Normal heart sounds.  Pulmonary:     Effort: Pulmonary effort is normal.     Breath sounds: Normal breath sounds.  Skin:    General: Skin is warm and dry.  Neurological:     General: No focal deficit present.     Mental Status: She is  alert and oriented to person, place, and time.  Psychiatric:        Mood and Affect: Mood normal.        Behavior: Behavior normal.        Judgment: Judgment normal.      No results found for any visits on 01/28/22.    The ASCVD Risk score (Arnett DK, et al., 2019) failed to calculate for the following reasons:   Cannot find a previous HDL lab   Cannot find a previous total cholesterol lab    Assessment & Plan:   Problem List Items Addressed This Visit       Musculoskeletal and Integument   Osteopenia    We will check serum vitamin D, will also order DEXA scan for osteoporosis screening.  Further recommendations may be made based upon these results.        Other   History of gastric bypass   Relevant Orders   VITAMIN D 25 Hydroxy (Vit-D Deficiency, Fractures)   Anemia - Primary    Asymptomatic, we will check CBC, iron, ferritin, vitamin B12.  Further recommendations may be made based upon the results.      Relevant Orders   CBC   Ferritin   Iron   Basic  metabolic panel   Vitamin B01   Other Visit Diagnoses     Osteoporosis screening           Return in about 6 months (around 07/29/2022) for F/u With Darryon Bastin.    Ailene Ards, NP

## 2022-01-28 NOTE — Assessment & Plan Note (Signed)
We will check serum vitamin D, will also order DEXA scan for osteoporosis screening.  Further recommendations may be made based upon these results.

## 2022-01-28 NOTE — Assessment & Plan Note (Signed)
Asymptomatic, we will check CBC, iron, ferritin, vitamin B12.  Further recommendations may be made based upon the results.

## 2022-01-28 NOTE — Assessment & Plan Note (Signed)
Patient is predisposed to difficulty with absorption, will check vitamin D, ferritin, iron, vitamin B12 for further evaluation to see if patient needs further supplementation.  Further recommendations may be made based upon these results.

## 2022-02-13 ENCOUNTER — Other Ambulatory Visit (HOSPITAL_COMMUNITY): Payer: Self-pay | Admitting: Internal Medicine

## 2022-02-18 ENCOUNTER — Other Ambulatory Visit (HOSPITAL_COMMUNITY): Payer: Self-pay

## 2022-02-18 MED ORDER — SILDENAFIL CITRATE 20 MG PO TABS: 20.0000 mg | ORAL_TABLET | Freq: Three times a day (TID) | ORAL | 1 refills | Status: DC

## 2022-02-18 NOTE — Telephone Encounter (Signed)
Meds ordered this encounter  Medications   sildenafil (REVATIO) 20 MG tablet    Sig: Take 1 tablet (20 mg total) by mouth 3 (three) times daily.    Dispense:  90 tablet    Refill:  1

## 2022-02-28 ENCOUNTER — Ambulatory Visit (INDEPENDENT_AMBULATORY_CARE_PROVIDER_SITE_OTHER): Payer: Medicare Other | Admitting: Internal Medicine

## 2022-02-28 DIAGNOSIS — J849 Interstitial pulmonary disease, unspecified: Secondary | ICD-10-CM | POA: Diagnosis not present

## 2022-02-28 LAB — PULMONARY FUNCTION TEST
DL/VA % pred: 112 %
DL/VA: 4.8 ml/min/mmHg/L
DLCO cor % pred: 108 %
DLCO cor: 18.39 ml/min/mmHg
DLCO unc % pred: 102 %
DLCO unc: 17.35 ml/min/mmHg
FEF 25-75 Pre: 3.03 L/sec
FEF2575-%Pred-Pre: 180 %
FEV1-%Pred-Pre: 101 %
FEV1-Pre: 1.93 L
FEV1FVC-%Pred-Pre: 118 %
FEV6-%Pred-Pre: 88 %
FEV6-Pre: 2.14 L
FEV6FVC-%Pred-Pre: 104 %
FVC-%Pred-Pre: 84 %
FVC-Pre: 2.14 L
Pre FEV1/FVC ratio: 90 %
Pre FEV6/FVC Ratio: 100 %

## 2022-02-28 NOTE — Progress Notes (Signed)
Spirometry/DLCO performed today. 

## 2022-02-28 NOTE — Patient Instructions (Signed)
Spirometry/DLCO performed today. 

## 2022-03-03 DIAGNOSIS — J849 Interstitial pulmonary disease, unspecified: Secondary | ICD-10-CM | POA: Diagnosis not present

## 2022-03-03 DIAGNOSIS — Z79624 Long term (current) use of inhibitors of nucleotide synthesis: Secondary | ICD-10-CM | POA: Diagnosis not present

## 2022-03-03 DIAGNOSIS — J84111 Idiopathic interstitial pneumonia, not otherwise specified: Secondary | ICD-10-CM | POA: Diagnosis not present

## 2022-03-03 DIAGNOSIS — I272 Pulmonary hypertension, unspecified: Secondary | ICD-10-CM | POA: Diagnosis not present

## 2022-03-03 DIAGNOSIS — Z87891 Personal history of nicotine dependence: Secondary | ICD-10-CM | POA: Diagnosis not present

## 2022-03-03 DIAGNOSIS — I7300 Raynaud's syndrome without gangrene: Secondary | ICD-10-CM | POA: Diagnosis not present

## 2022-03-03 DIAGNOSIS — L659 Nonscarring hair loss, unspecified: Secondary | ICD-10-CM | POA: Diagnosis not present

## 2022-03-03 DIAGNOSIS — R0602 Shortness of breath: Secondary | ICD-10-CM | POA: Diagnosis not present

## 2022-03-03 DIAGNOSIS — J984 Other disorders of lung: Secondary | ICD-10-CM | POA: Diagnosis not present

## 2022-03-08 DIAGNOSIS — I7300 Raynaud's syndrome without gangrene: Secondary | ICD-10-CM | POA: Diagnosis not present

## 2022-03-08 DIAGNOSIS — J849 Interstitial pulmonary disease, unspecified: Secondary | ICD-10-CM | POA: Diagnosis not present

## 2022-03-08 DIAGNOSIS — M1990 Unspecified osteoarthritis, unspecified site: Secondary | ICD-10-CM | POA: Diagnosis not present

## 2022-03-08 DIAGNOSIS — M359 Systemic involvement of connective tissue, unspecified: Secondary | ICD-10-CM | POA: Diagnosis not present

## 2022-03-08 DIAGNOSIS — Z79899 Other long term (current) drug therapy: Secondary | ICD-10-CM | POA: Diagnosis not present

## 2022-03-08 DIAGNOSIS — R768 Other specified abnormal immunological findings in serum: Secondary | ICD-10-CM | POA: Diagnosis not present

## 2022-03-10 ENCOUNTER — Other Ambulatory Visit: Payer: Self-pay | Admitting: Nurse Practitioner

## 2022-03-10 DIAGNOSIS — Z1231 Encounter for screening mammogram for malignant neoplasm of breast: Secondary | ICD-10-CM

## 2022-04-06 IMAGING — CT CT CHEST HIGH RESOLUTION
2 of 7 series · 14 of 36 positions shown, 17 images · non-contrast
Comparison: None.

CLINICAL DATA: Autoimmune disease, crackles in chest.



[Series 4: high resolution retro · axial · 0.57mm/px · z∈[-238,+24]mm · 11 of 316 slices shown, 14 images]
[im 27/316  mediastinal]
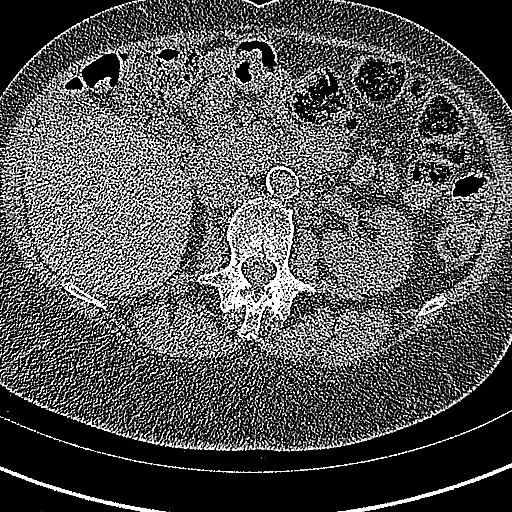
[im 27/316  lung]
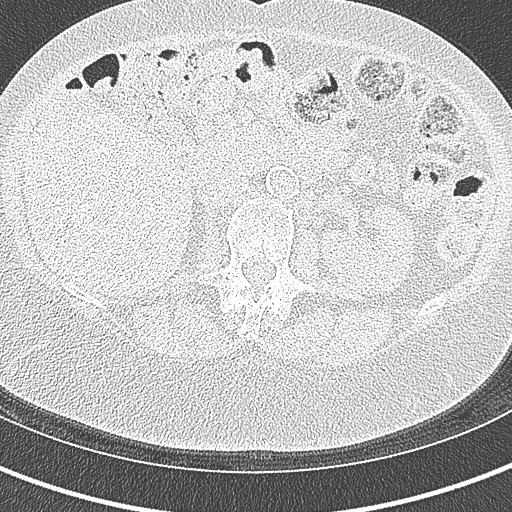
[im 53/316  lung]
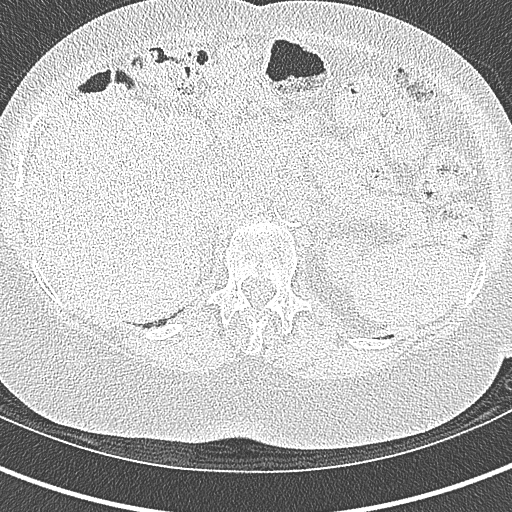
[im 79/316  lung]
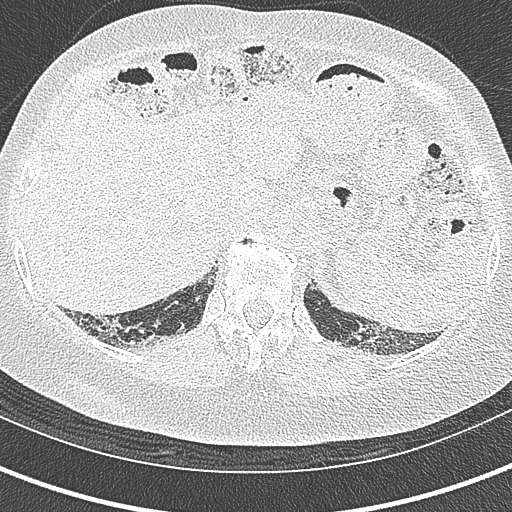
[im 106/316  lung]
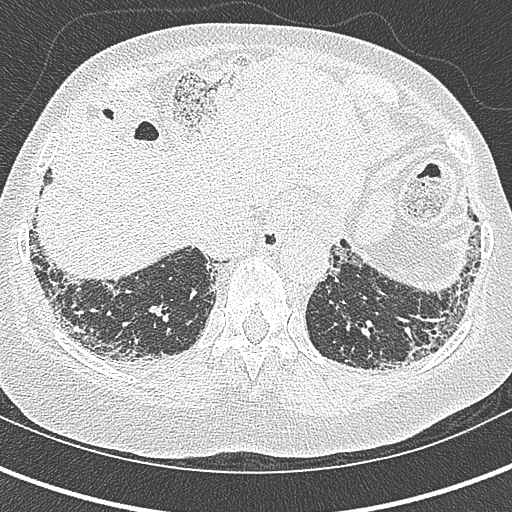
[im 132/316  mediastinal]
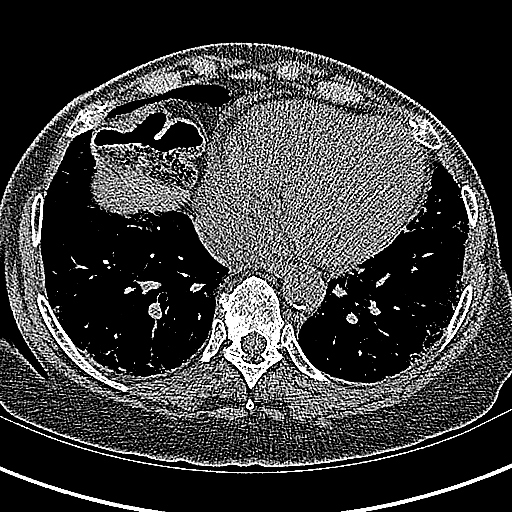
[im 132/316  lung]
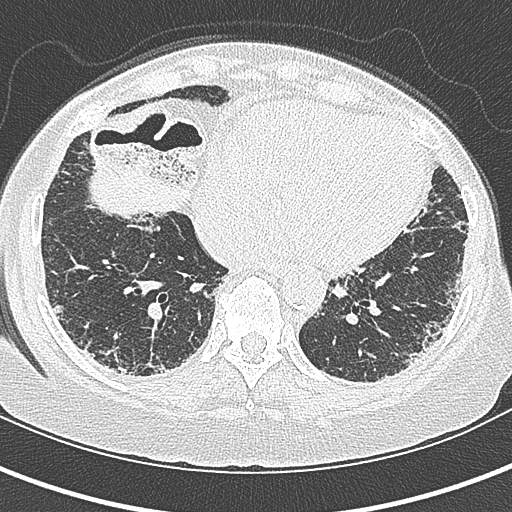
[im 158/316  lung]
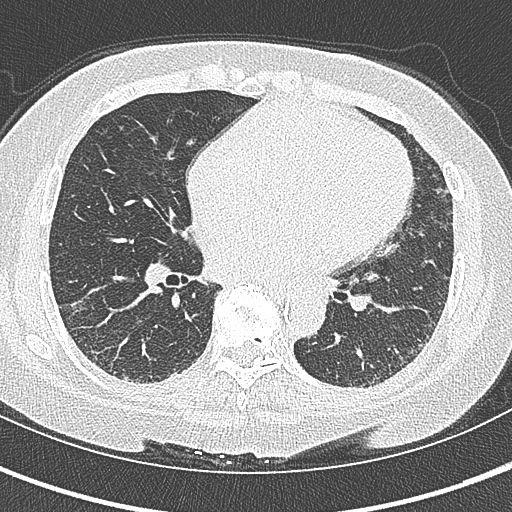
[im 184/316  lung]
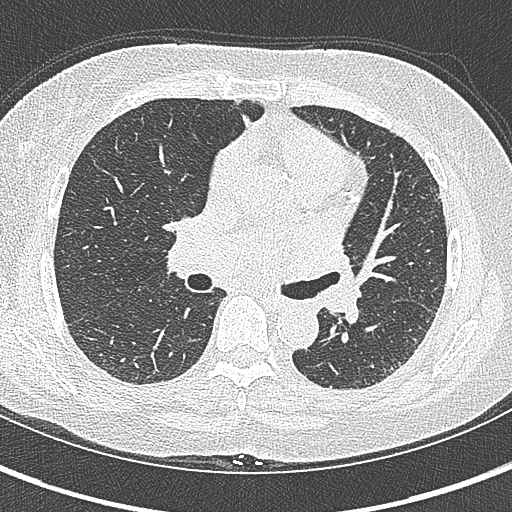
[im 211/316  lung]
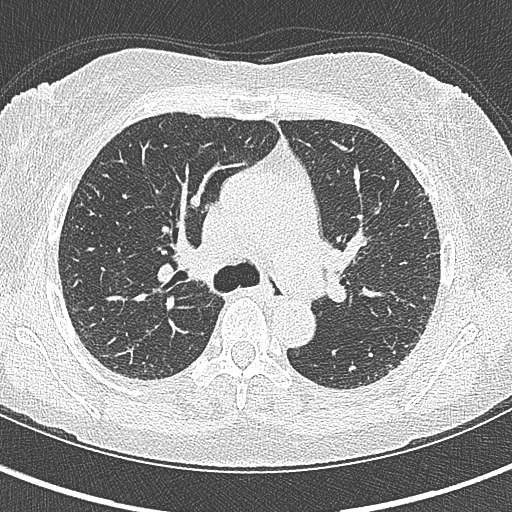
[im 237/316  mediastinal]
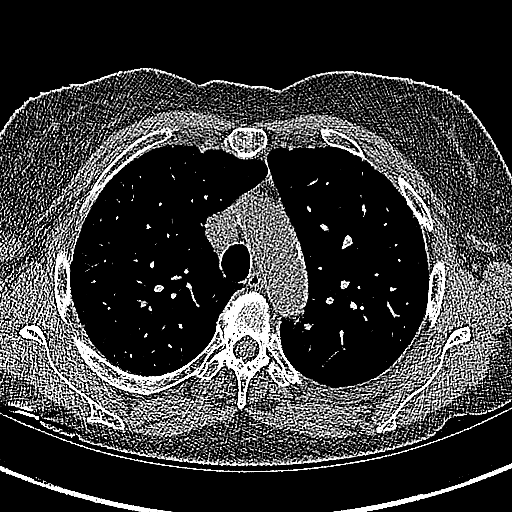
[im 237/316  lung]
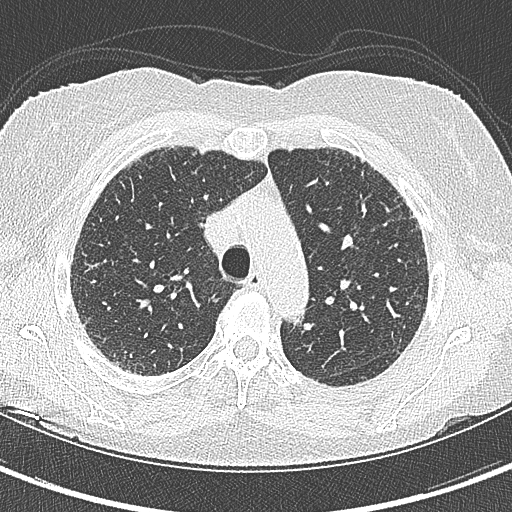
[im 263/316  lung]
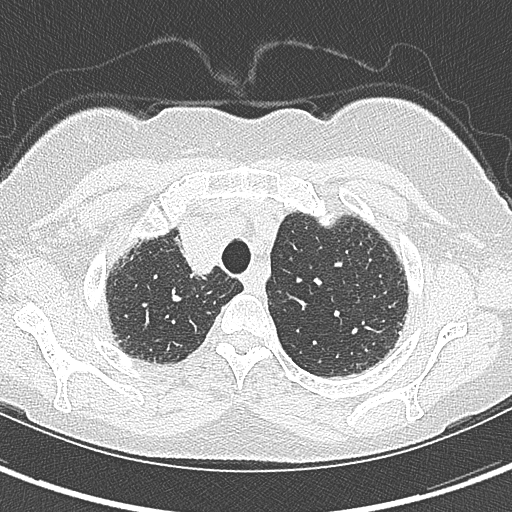
[im 289/316  lung]
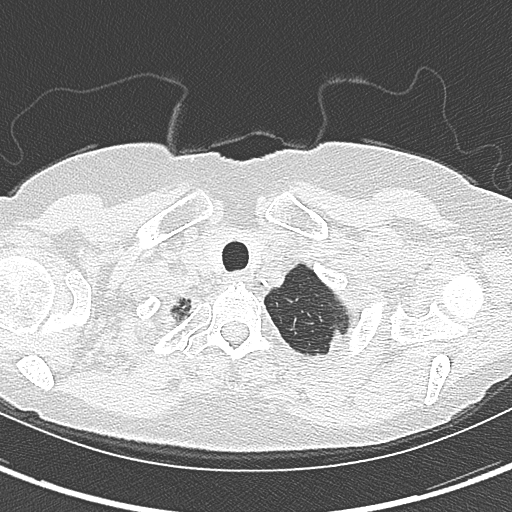

[Series 9: coronal · coronal · 0.61mm/px · 3 of 88 slices shown]
[im 18/88  lung]
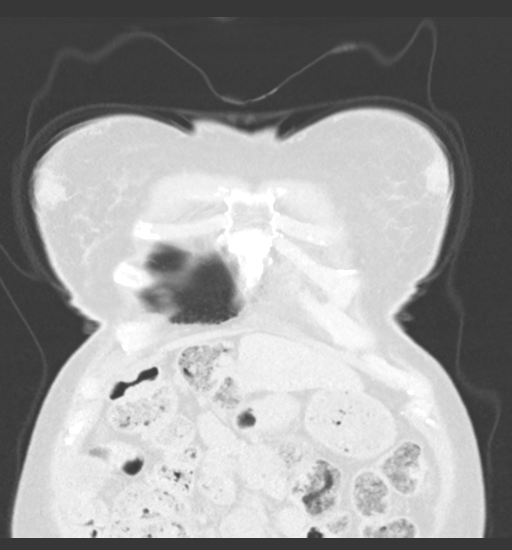
[im 35/88  lung]
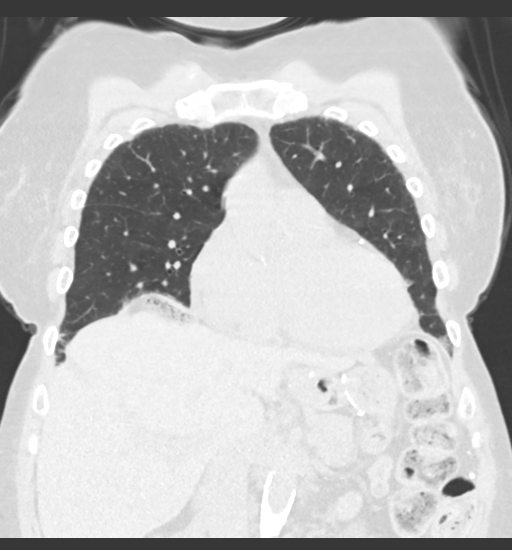
[im 53/88  lung]
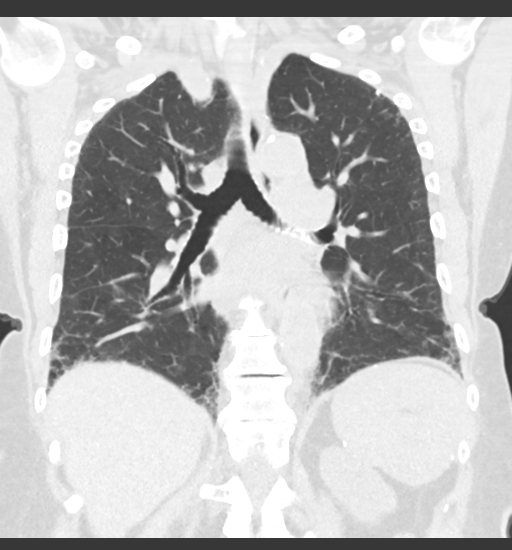

[14 of 36 positions shown; findings below may reference images not displayed]

FINDINGS: Cardiovascular: Atherosclerotic calcification of the aorta, aortic
valve and coronary arteries. Enlarged right and left pulmonary
arteries and heart. Small amount of pericardial fluid may be
physiologic.

Mediastinum/Nodes: Mediastinal lymph nodes measure up to 10 mm in
the AP window. Hilar regions are difficult to evaluate without IV
contrast. Small axillary and subpectoral lymph nodes with the
largest measuring 1.4 cm in the right axilla. Esophagus is grossly
unremarkable.

Lungs/Pleura: Peripheral basilar subpleural reticulation,
ground-glass and traction bronchiectasis/bronchiolectasis. No
definitive honeycombing. Probable subpleural lymph nodes along the
right major fissure, measuring up to 5 mm. Calcified granulomas.
Noncalcified 5 mm lingular nodule (4 x 6 mm, 6/73). No pleural
fluid. Airway is unremarkable. Mild air trapping.

Upper Abdomen: Visualized portion of the liver is unremarkable.
Stone in the gallbladder. Visualized portions of the adrenal glands,
kidneys, spleen and pancreas are grossly unremarkable. Gastric
bypass.

Musculoskeletal: Degenerative changes in the spine. Old right rib
fractures. No worrisome lytic or sclerotic lesions.
IMPRESSION: 1. Pulmonary parenchymal pattern of interstitial lung disease may be
due to usual interstitial pneumonitis or nonspecific interstitial
pneumonitis. Findings are categorized as probable UIP per consensus
guidelines: Diagnosis of Idiopathic Pulmonary Fibrosis: An Official
ATS/ERS/JRS/ALAT Clinical Practice Guideline. Am J Respir Crit Care
Med Vol 198, Pg 5, [id number][DATE].
2. Small amount of pericardial fluid may be physiologic.
3. 5 mm lingular nodule. If the patient is high-risk, a non-contrast
chest CT can be considered in 12 months.This recommendation follows
the consensus statement: Guidelines for Management of Incidental
Pulmonary Nodules Detected on CT Images: From the [HOSPITAL]
4. Small to borderline enlarged mediastinal and axillary lymph
nodes. Difficult to exclude a lymphoproliferative disorder.
5. Cholelithiasis.
6. Aortic atherosclerosis ([id number]). Coronary artery
calcification.
7. Enlarged pulmonary arteries, indicative of pulmonary arterial
hypertension.
8.  Emphysema ([id number].O).

## 2022-04-12 ENCOUNTER — Ambulatory Visit (INDEPENDENT_AMBULATORY_CARE_PROVIDER_SITE_OTHER): Payer: Medicare Other | Admitting: Internal Medicine

## 2022-04-12 ENCOUNTER — Encounter: Payer: Self-pay | Admitting: Internal Medicine

## 2022-04-12 VITALS — BP 124/72 | HR 76 | Ht 62.0 in | Wt 179.8 lb

## 2022-04-12 DIAGNOSIS — I7300 Raynaud's syndrome without gangrene: Secondary | ICD-10-CM

## 2022-04-12 DIAGNOSIS — Z79899 Other long term (current) drug therapy: Secondary | ICD-10-CM | POA: Diagnosis not present

## 2022-04-12 DIAGNOSIS — J8489 Other specified interstitial pulmonary diseases: Secondary | ICD-10-CM

## 2022-04-12 DIAGNOSIS — R768 Other specified abnormal immunological findings in serum: Secondary | ICD-10-CM | POA: Diagnosis not present

## 2022-04-12 DIAGNOSIS — Z7185 Encounter for immunization safety counseling: Secondary | ICD-10-CM | POA: Diagnosis not present

## 2022-04-12 DIAGNOSIS — M359 Systemic involvement of connective tissue, unspecified: Secondary | ICD-10-CM

## 2022-04-12 DIAGNOSIS — I2721 Secondary pulmonary arterial hypertension: Secondary | ICD-10-CM | POA: Diagnosis not present

## 2022-04-12 DIAGNOSIS — Q998 Other specified chromosome abnormalities: Secondary | ICD-10-CM

## 2022-04-12 NOTE — Progress Notes (Signed)
OV 08/26/2021 - new ILD [facility]. Referred by Dr Bo Merino  Subjective:  Patient ID: Chelsea Callahan, female , DOB: 03/20/1951 , age 71 y.o. , MRN: [id number] , ADDRESS: [place] [id number] PCP Ailene Ards, NP Patient Care Team: Ailene Ards, NP as PCP - General (Nurse Practitioner)  This Provider for this visit: Treatment Team:  Attending Provider: Brand Males, MD    08/26/2021 -   Chief Complaint  Patient presents with   Consult    Referred for possible ILD     HPI Chelsea Callahan 71 y.o. -referred by Dr Armanda Heritage.  71 year old female.  She used to live in [place] and [place] and.  She is a retired Optometrist.  She was a city Barista.  Then in 2013 she relocated to [facility] along with her husband.  This is for her retirement.  She tells me that approximately 13 years ago she developed Raynaud's symptoms.  She also started having some alopecia may be a year or 2 before that.  This then resulted in a positive ANA testing.  She then started following with a rheumatologist for 5 years before she relocated to [facility] and for the last 8 or 9 years has established care with Dr. Estanislado Pandy.  Her ANA here is also positive [see below].  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  She is followed periodically.  She is on Plaquenil.  She has arthritis but this is osteoarthritis.  She denies any scleroderma findings or rheumatoid arthritis findings of malar rash or oral ulcers or hematuria or other connective tissue disease findings beyond the Raynaud's.  During the course of routine follow-up this year she was discovered to have crackles.  The notes also confirmed this.  This resulted in a high-resolution CT chest that shows interstitial lung disease predominantly with a craniocaudal gradient.  Reticulation and some traction  bronchiectasis but no honeycombing.  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  Therefore she has been referred here.  She tells me that she prefers to be very aggressive with the treatment and management approach.  She has a life goal of living up to 80 years.  Currently she is able to walk 2 miles every 2 weeks.  She never stops.   She does not feel much short of breath.  Initially she has some chest tightness and this resolves.  Is not much of a cough.  Past medical history - Negative for any specific connective tissue disease.  Negative for hiatal hernia and acid reflux. - Positive for ANA + and low titer rheumatoid factor [discussion with Dr. Keturah Barre today:: Undifferentiated connective tissue disease:] - She has had the COVID disease in January 2022.  She also had COVID-vaccine 4 shots.  Review of systems - Raynaud's present - Osteoarthritis present  Family history of pulmonary disease - Brother has myasthenia gravis but otherwise negative  Exposure history - Smoked from [age] although in between she stopped between [place].  3 to 5 cigarettes/day.  She did some marijuana as a teenager.  Otherwise no cocaine use or no intravenous drug use.  Home and [place].  She has lived there for 2 years.  Is a private home.  Detail organic antigen exposure history in this house is negative.  She is never used feather pillow or down jackets.  Occupational history - [place] and city [employer]ista.  Detail organic and inorganic antigen exposure history at work is negative  Pulmonary toxicity history - Denies any adverse medication intake    AUOTIMMUNE   Latest Reference Range & Units 10/17/16 12:14 08/17/17 14:55 03/07/18 14:48 07/03/19 11:32 06/16/20 14:15 11/13/20 00:00 07/06/21 15:20  Anti Nuclear Antibody (ANA) NEGATIVE        POSITIVE !  ANA Pattern 1        Nuclear, Nucleolar !  ANA Titer 1 titer       1:1,280 (H)  ds DNA Ab  IU/mL _0 ENA SM Ab Ser-aCnc <1.0 NEG AI       <1.0 NEG  !: Data is abnormal (H): Data is abnormally high  Latest Reference Range & Units 07/06/21 15:20  Ribonucleic Protein(ENA) Antibody, IgG <1.0 NEG AI <1.0 NEG  ENA SM Ab Ser-aCnc <1.0 NEG AI <1.0 NEG  SSA (Ro) (ENA) Antibody, IgG <1.0 NEG AI <1.0 NEG  SSB (La) (ENA) Antibody, IgG <1.0 NEG AI <1.0 NEG  Scleroderma (Scl-70) (ENA) Antibody, IgG <1.0 NEG AI <1.0 NEG    Latest Reference Range & Units 10/17/16 12:14 08/17/17 14:55 03/07/18 14:48 07/03/19 11:32 06/16/20 14:15 11/13/20 00:00 07/06/21 15:20  Sed Rate 0 - 30 mm/h  _1 Anticardiolipin Ab,IgA,Qn APL <11 <11       Anticardiolipin Ab,IgG,Qn GPL <14 <14       Anticardiolipin Ab,IgM,Qn MPL <12 <12        CT Chest data - HRCT March 2023  Narrative & Impression  CLINICAL DATA:  Autoimmune disease, crackles in chest.   EXAM: CT CHEST WITHOUT CONTRAST   TECHNIQUE: Multidetector CT imaging of the chest was performed following the standard protocol without intravenous contrast. High resolution imaging of the lungs, as well as inspiratory and expiratory imaging, was performed.   RADIATION DOSE REDUCTION: This exam was performed according to the departmental dose-optimization program which includes automated exposure control, adjustment of the mA and/or kV according to patient size and/or use of iterative reconstruction technique.   COMPARISON:  None.   FINDINGS: Cardiovascular: Atherosclerotic calcification of the aorta, aortic valve and coronary arteries. Enlarged right and left pulmonary arteries and heart. Small amount of pericardial fluid may be physiologic.   Mediastinum/Nodes: Mediastinal lymph nodes measure up to 10 mm in the AP window. Hilar regions are difficult to evaluate without IV contrast. Small axillary and subpectoral lymph nodes with the largest measuring 1.4 cm in the right axilla. Esophagus is grossly unremarkable.    Lungs/Pleura: Peripheral basilar subpleural reticulation, ground-glass and traction bronchiectasis/bronchiolectasis. No definitive honeycombing. Probable subpleural lymph nodes along the right major fissure, measuring up to 5 mm. Calcified granulomas. Noncalcified 5 mm lingular nodule (4 x 6 mm, 6/73). No pleural fluid. Airway is unremarkable. Mild air trapping.   Upper Abdomen: Visualized portion of the liver is unremarkable. Stone in the gallbladder. Visualized portions of the adrenal glands, kidneys, spleen and pancreas are grossly unremarkable. Gastric bypass.   Musculoskeletal: Degenerative changes in the spine. Old right rib fractures. No worrisome lytic or sclerotic lesions.   IMPRESSION: 1. Pulmonary parenchymal pattern of interstitial lung disease may be due to usual interstitial pneumonitis or nonspecific interstitial pneumonitis. Findings are categorized as probable UIP per consensus guidelines: Diagnosis of Idiopathic Pulmonary Fibrosis: An Official ATS/ERS/JRS/ALAT Clinical Practice Guideline. [place]  198, Iss 5, [id number], Dec 31 2016. 2. Small amount of pericardial fluid may be physiologic. 3. 5 mm lingular nodule. If the patient is high-risk, a non-contrast chest CT can be considered in 12 months.This recommendation follows the consensus statement: Guidelines for Management of Incidental Pulmonary Nodules Detected on CT Images: From the Fleischner Society 2017; Radiology 2017; 284:228-243. 4. Small to borderline enlarged mediastinal and axillary lymph nodes. Difficult to exclude a lymphoproliferative disorder. 5. Cholelithiasis. 6. Aortic atherosclerosis (ICD10-I70.0). Coronary artery calcification. 7. Enlarged pulmonary arteries, indicative of pulmonary arterial hypertension. 8.  Emphysema (ICD10-J43.9).     Electronically Signed   By: Lorin Picket M.D.   On: 07/19/2021 10:34         10/08/2021 Patient presents today for  follow-up.  She has a history of undifferentiated connective tissue disease.  She follows with rheumatology.  HRCT imaging in March showed pulmonary parenchymal pattern of interstitial lung disease, findings are categorized as probable UIP.  One antibody in myositis panel was positive.  Recently been placed on CellCept by Dr. Kathlene November with Rheumatology, awaiting results of hep panel before starting medication.  Pulmonary function testing today was normal.  Simple walk test without oxygen desaturation.  She is largely asymptomatic.  She walks on average 2 miles a day 5 times a week.  Only respiratory complaint is some dyspnea symptoms about halfway through her walk.  She has no significant shortness of breath, cough, chest tightness or wheezing.  Pulmonary function testing 10/08/2021 FVC 2.09 (107%), FEV1 1.87 (125%), ratio 90, TLC 81%, DLCOunc 102%  RHC 10/27/21  Findings:   RA = 8 RV = 45/11 PA = 45/13 (28) PCW = 17 Fick cardiac output/index = 5.2/2.9 PVR = 2.1 FA sat = 97% PA sat = 69%, 70% SVC sat = 66%   Assessment: 1. 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 Chase Caller. Likely start macitentan    Glori Bickers, MD  1:40 PM   OV 01/25/2022  Subjective:  Patient ID: Chelsea Callahan, female , DOB: [birth year] , age 65 y.o. , MRN: [id number] , ADDRESS: [place] [id number] PCP Ailene Ards, NP Patient Care Team: Ailene Ards, NP as PCP - General (Nurse Practitioner)  This Provider for this visit: Treatment Team:  Attending Provider: Brand Males, MD    01/25/2022 -   Chief Complaint  Patient presents with   Follow-up    PFT performed 8/11 at [facility].  Pt states she has been doing okay since last visit and denies any complaints.   HPI Chelsea Callahan 71 y.o. -returns for follow-up.  Since I last personally met her she has now been started on CellCept after switching rheumatologist to [facility] Dr. Kathlene November.  This is on account of positive ANA and also positive myositis antibody and Raynaud's.  She is also had a right heart catheterization.  She has been started on room audio.  She continues to be very minimally symptomatic or asymptomatic from the shortness of breath cough and fatigue standpoint.  She she also wanted to get a second opinion for lung disease.  She is taken an appointment Dr. Wynn Maudlin at [facility]  She did have pulmonary function test in August 2023.  Some except for admit as documented below.  However when she went there they only had a pulmonary function test scheduled but not the office appointment.  The office appointment is now  being scheduled for March 03, 2022 which is 3 months since the pulmonary function test.  Also at this time she is now on CellCept.  We took a shared decision making that she will have 1 more pulmonary function test it was the end of October 2023 to give a sequential timeline for Dr. Randol Kern to help with his decision making.  We discussed the fact that nintedanib would be the next agent and whether we will start it now or wait till if any progression.  This will be a question that would be post to Dr. Randol Kern.  The pulmonary function test data should help.  We discussed her respiratory vaccines but currently she wants to hold off.  Of note she has a 5 mm lingular nodule in March 2023.  She is a remote smoker.  We will get a repeat CT scan of the chest in spring or summer 2024.    PFT  OV 04/12/2022  Subjective:  Patient ID: Chelsea Callahan, female , DOB: 1950-09-23 , age 77 y.o. , MRN: [id number] , ADDRESS: [place] [id number] PCP Ailene Ards, NP Patient Care Team: Ailene Ards, NP as PCP - General (Nurse Practitioner)  This Provider for this visit: Treatment Team:  Attending Provider: Brand Males, MD    04/12/2022 -   Chief Complaint  Patient presents with   Follow-up    Pt  states she has been doing okay since last visit. States she has had an infection in her mouth that she states her dentist does not know how to treat.    #Undifferentiated connective tissue disease  -Myositis antibody negative's 2023 `- Raynaud's + -ANA +2023 -On CellCept through Dr. Kathlene November since October 05, 2021 -Also on [place] second opinion Dr. 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  -Crackles on presentation  -Very mild burden.  Essentially asymptomatic normal PFTs 2023  #Mild pulmonary hypertension right heart cath June 2023 --Dr. Haroldine Laws  -Pulmonary capillary wedge pressure 17, PVR 2.1, PA mean 28  -Revatio started by Dr. Haroldine Laws fper history  #5 mm lingula nodule March 2023  HPI Chelsea Callahan 71 y.o. -returns for follow-up.  She continues on CellCept 100 mg twice daily.  After seeing me last time she had pulmonary function test that shows stability [see below].  She then saw Dr. Wynn Maudlin.  He advised continuing current course with CellCept 5 mg twice daily and then consider add-on therapy.  They discussed biopsy.  But currently that was being withheld.  He also felt it was too early for lung transplant.  He did weigh in on adding a second drug of going up on CellCept but in balance they took a shared decision making to continue current course.  Currently she is content continuing the current course.  She has seen Dr. Kathlene November in rheumatology recently and had blood work for her CellCept and she said it is normal.  We again discussed vaccines but she declined.  I did emphasize the risk that comes with respiratory infections in the setting of immunosuppression pulmonary fibrosis but she prefers to defer vaccination.  I then advised her to stay away from human clusters especially during the fall and winter and avoid sick contacts.  She verbalized understanding.  We discussed continued serial monitoring.  She is most accepting of this  plan.  She will continue her CellCept at the current dose.  We discussed about ways to monitor her symptoms and if  there is any change in her shortness of breath between now and the next visit she will let us know.    SYMPTOM SCALE - ILD 01/25/2022 01/25/2022  04/12/2022   Current weight     O2 use ra ra ra  Shortness of Breath 0 -> 5 scale with 5 being worst (score 6 If unable to do)    At rest 000 0 0  Simple tasks - showers, clothes change, eating, shaving 0 0 0  Household (dishes, doing bed, laundry) 0 0 1  Shopping 00 0 1  Walking level at own pace 0 0 1  Walking up Stairs 0  1+  Total (30-36) Dyspnea Score 0 00 4  How bad is your cough? 0 0 0  How bad is your fatigue 0 0 1  How bad is nausea 0 0 0  How bad is vomiting?  0 0 0  How bad is diarrhea? 0 0 0  How bad is anxiety? 0 0 2  How bad is depression _0 Any chronic pain - if so where and how bad 1 1 x      Simple office walk 185 feet x  3 laps goal with forehead probe 08/26/2021  10/08/2021   O2 used ra RA  Number laps completed C2 of 3 3  Comments about pace x normal  Resting Pulse Ox/HR 100% and 83/min 100% and 58/min  Final Pulse Ox/HR 100% and 130/min 100% and 133/min  Desaturated </= 88% no No  Desaturated <= 3% points no No  Got Tachycardic >/= 90/min yes Yes  Symptoms at end of test x x  Miscellaneous comments x x    PFT     Latest Ref Rng & Units 02/28/2022    2:21 PM 10/08/2021   10:54 AM  PFT Results  FVC-Pre L 2.14  2.14   FVC-Predicted Pre % 84  110   FVC-Post L  2.09   FVC-Predicted Post %  107   Pre FEV1/FVC % % 90  90   Post FEV1/FCV % %  90   FEV1-Pre L 1.93  1.93   FEV1-Predicted Pre % 101  129   FEV1-Post L  1.87   DLCO uncorrected ml/min/mmHg 17.35  17.27   DLCO UNC% % 102  102   DLCO corrected ml/min/mmHg 18.39  17.27   DLCO COR %Predicted % 108  102   DLVA Predicted % 112  104   TLC L  3.64   TLC % Predicted %  81   RV % Predicted %  63        has a past medical  history of Anemia, 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HCC), Blood transfusion without reported diagnosis, Cataract, GERD (gastroesophageal reflux disease), Primary osteoarthritis of right knee, Raynaud disease, Raynaud disease, Spinal headache, and Vasculitis ([place]).   reports that she quit smoking about 24 years ago. Her smoking use included cigarettes. She has a 3.75 pack-year smoking history. She has been exposed to tobacco smoke. She has never used smokeless tobacco.  Past Surgical History:  Procedure Laterality Date   abdominal clip     to stomach post gastric bypass   AXILLARY LYMPH NODE BIOPSY Right 01/27/2021   Procedure: RIGHT AXILLARY LYMPH NODE BIOPSY;  Surgeon: Erroll Luna, MD;  Location: [facility];  Service: General;  Laterality: Right;   CESAREAN SECTION     x 2   GASTRIC BYPASS     HERNIA REPAIR  2010   inverted  hermia   HIP ARTHROPLASTY     JOINT REPLACEMENT Right 2005   hip   JOINT REPLACEMENT Bilateral    knee    KNEE ARTHROPLASTY Left 09/13/2013   Procedure: LEFT COMPUTER ASSISTED TOTAL KNEE ARTHROPLASTY;  Surgeon: Marybelle Killings, MD;  Location: [facility];  Service: Orthopedics;  Laterality: Left;  Left Total Knee Arthroplasty, Computer Assist   KNEE ARTHROPLASTY Right 05/23/2014   Procedure: COMPUTER ASSISTED TOTAL KNEE ARTHROPLASTY;  Surgeon: Marybelle Killings, MD;  Location: [facility];  Service: Orthopedics;  Laterality: Right;   LUMBAR LAMINECTOMY/DECOMPRESSION MICRODISCECTOMY N/A 05/13/2016   Procedure: L4-5 Decompression;  Surgeon: Marybelle Killings, MD;  Location: [facility];  Service: Orthopedics;  Laterality: N/A;   RIGHT HEART CATH N/A 10/27/2021   Procedure: RIGHT HEART CATH;  Surgeon: Jolaine Artist, MD;  Location: [facility];  Service: Cardiovascular;  Laterality: N/A;   teeth etraction     TOTAL KNEE ARTHROPLASTY Right 05/22/2014   dr Lorin [facility]   TUBAL LIGATION  1981   UPPER GI ENDOSCOPY      Allergies  Allergen Reactions   Ginger Swelling and Other (See  Comments)    Stomach swells   Pork-Derived Products Other (See Comments)    UNSPECIFIED "PERSONAL REASONS"   Shellfish Allergy Other (See Comments)    UNSPECIFIED "Personal reasons/dietary"    Immunization History  Administered Date(s) Administered   PFIZER Comirnaty(Gray Top)Covid-19 Tri-Sucrose Vaccine 05/21/2019, 06/11/2019, 04/05/2020, 10/13/2020   PFIZER(Purple Top)SARS-COV-2 Vaccination 05/21/2019, 06/11/2019, 04/05/2020   Zoster Recombinat (Shingrix) 03/08/2018, 05/08/2018   Zoster, Unspecified 03/08/2018, 05/08/2018    Family History  Problem Relation Age of Onset   Cancer Brother    Multiple myeloma Brother    Colon cancer Neg Hx    Esophageal cancer Neg Hx    Rectal cancer Neg Hx    Stomach cancer Neg Hx      Current Outpatient Medications:    aspirin EC 81 MG tablet, Take 81 mg by mouth daily. Swallow whole., Disp: , Rfl:    Black Pepper-Turmeric (TURMERIC CURCUMIN) 08-998 MG CAPS, Take 1,000 mg by mouth daily., Disp: , Rfl:    Cholecalciferol (CVS D3) 25 MCG (1000 UT) capsule, Take 1,000 Units by mouth daily., Disp: , Rfl:    Fe Bisgly-Succ-C-Thre-B12-FA (IRON-150 PO), Take 1 tablet by mouth daily., Disp: , Rfl:    hydroxychloroquine (PLAQUENIL) 200 MG tablet, TAKE 1 TABLET BY MOUTH EVERY DAY, Disp: 90 tablet, Rfl: 0   Multiple Vitamin (MULTIVITAMIN WITH MINERALS) TABS tablet, Take 1 tablet by mouth daily., Disp: , Rfl:    mycophenolate (CELLCEPT) 500 MG tablet, Take 500 mg by mouth 2 (two) times daily., Disp: , Rfl:    OVER THE COUNTER MEDICATION, Take 1 capsule by mouth daily. [place], Disp: , Rfl:    psyllium (METAMUCIL) 58.6 % packet, Take 1 packet by mouth daily as needed (constipation)., Disp: , Rfl:    sildenafil (REVATIO) 20 MG tablet, Take 1 tablet (20 mg total) by mouth 3 (three) times daily., Disp: 90 tablet, Rfl: 1      Objective:   Vitals:   04/12/22 1517  BP: 124/72  Pulse: 76  SpO2: 98%  Weight: 179 lb 12.8 oz (81.6 kg)  Height: _0   (1.575 m)    Estimated body mass index is 32.89 kg/m as calculated from the following:   Height as of this encounter: _1  (1.575 m).   Weight as of this encounter: 179 lb 12.8 oz (81.6 kg).  @  Luan Pulling  Filed Weights   04/12/22 1517  Weight: 179 lb 12.8 oz (81.6 kg)     Physical Exam    General: No distress. Looks well Neuro: Alert and Oriented x 3. GCS 15. Speech normal Psych: Pleasant Resp:  Barrel Chest - no.  Wheeze - no, Crackles - YES, No overt respiratory distress CVS: Normal heart sounds. Murmurs - no Ext: Stigmata of Connective Tissue Disease - no HEENT: Normal upper airway. PEERL +. No post nasal drip        Assessment:       ICD-10-CM   1. Interstitial lung disease due to connective tissue disease ([place])  J84.89    M35.9     2. Raynaud's disease without gangrene  I73.00     3. ANA positive  R76.8     4. 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 On Cellcept therapy  Z79.899     6. WHO group 1 pulmonary arterial hypertension (HCC)  I27.21     7. Vaccine counseling  Z71.85          Plan:     Patient Instructions     ICD-10-CM   1. Interstitial lung disease due to connective tissue disease ([place])  J84.89    M35.9     2. Raynaud's disease without gangrene  I73.00     3. ANA positive  R76.8     4. 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 On Cellcept therapy  Z79.899     6. Immunosuppressed status ([place])  D84.9     7. Vaccine counseling  Z71.85      Interstitial lung disease due to connective tissue disease ([place]) Raynaud's disease without gangrene ANA positive Myositis associated antibody positive On Cellcept therapy Immunosuppressed status ([place])  - clinically stable  - tolerating well cellcept 513m twice daily since May/June 2023 via Dr AK[facility]lene November[facility]: continue current course  Plan - continue cellcept via Dr AKathlene November  - do PFT mid march  2024 - do 1 year CT HRCT  mid-march 2024 - monitor symptoms  -any worsening then consider adding new medications (ofev or Rituxan or increaseing cellcept)  Pulmonary Hypertension  - stable  on revatio  Plan  Conitnue revatio through Dr BJeffie Pollock 531mLingular Nodule March 2023  Plan  - HRCT in mid-march 2024  Vaccine counseling  - respect deferral flu, rsv and covid vaccine - but please do avoid sick people and human clusters esp in fall/winter to avoid gettig sock   Followup  - march 2024 after PFT and HRCT- 30 min visit  - symptoms sscore and walk test at followup    SIGNATURE    Dr. MuBrand MalesM.D., F.C.C.P,  Pulmonary and Critical Care Medicine Staff Physician, Co[place]irector - Interstitial Lung Disease  Program  Pulmonary Fi[id number]: [phone] no answer or between  15:00h - 7:00h: call [phone] Telephone: [phone]  3:49 PM 04/12/2022

## 2022-04-12 NOTE — Patient Instructions (Addendum)
ICD-10-CM   1. Interstitial lung disease due to connective tissue disease (HCC)  J84.89    M35.9     2. Raynaud's disease without gangrene  I73.00     3. ANA positive  R76.8     4. 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 On Cellcept therapy  Z79.899     6. Immunosuppressed status (HCC)  D84.9     7. Vaccine counseling  Z71.85      Interstitial lung disease due to connective tissue disease (HCC) Raynaud's disease without gangrene ANA positive Myositis associated antibody positive On Cellcept therapy Immunosuppressed status (HCC)  - clinically stable  - tolerating well cellcept 500mg  twice daily since May/June 2023 via Dr July 2023 - 2nd opinion at [facility] - Nov 2023: continue current course  Plan - continue cellcept via Dr Dec 2023   - do PFT mid march  2024 - do 1 year CT HRCT mid-march 2024 - monitor symptoms  -any worsening then consider adding new medications (ofev or Rituxan or increaseing cellcept)  Pulmonary Hypertension  - stable  on revatio  Plan  Conitnue revatio through Dr April 2024  55mm Lingular Nodule March 2023  Plan  - HRCT in mid-march 2024  Vaccine counseling  - respect deferral flu, rsv and covid vaccine - but please do avoid sick people and human clusters esp in fall/winter to avoid gettig sock   Followup  - march 2024 after PFT and HRCT- 30 min visit  - symptoms sscore and walk test at followup

## 2022-05-03 ENCOUNTER — Encounter: Payer: Self-pay | Admitting: Internal Medicine

## 2022-05-03 NOTE — Progress Notes (Unsigned)
    Subjective:    Patient ID: Chelsea Callahan, female    DOB: May 08, 1950, 72 y.o.   MRN: [id number]      HPI Chelsea Callahan is here for No chief complaint on file.   She is here for an acute visit for cold symptoms.   Her symptoms started > 1 week ago  She is experiencing   She has tried taking    Home covid test negative   Medications and allergies reviewed with patient and updated if appropriate.  Current Outpatient Medications on File Prior to Visit  Medication Sig Dispense Refill   aspirin EC 81 MG tablet Take 81 mg by mouth daily. Swallow whole.     Black Pepper-Turmeric (TURMERIC CURCUMIN) 08-998 MG CAPS Take 1,000 mg by mouth daily.     Cholecalciferol ([employer] D3) 25 MCG (1000 UT) capsule Take 1,000 Units by mouth daily.     Fe Bisgly-Succ-C-Thre-B12-FA (IRON-150 PO) Take 1 tablet by mouth daily.     hydroxychloroquine (PLAQUENIL) 200 MG tablet TAKE 1 TABLET BY MOUTH EVERY DAY 90 tablet 0   Multiple Vitamin (MULTIVITAMIN WITH MINERALS) TABS tablet Take 1 tablet by mouth daily.     mycophenolate (CELLCEPT) 500 MG tablet Take 500 mg by mouth 2 (two) times daily.     OVER THE COUNTER MEDICATION Take 1 capsule by mouth daily. Sea Moss     psyllium (METAMUCIL) 58.6 % packet Take 1 packet by mouth daily as needed (constipation).     sildenafil (REVATIO) 20 MG tablet Take 1 tablet (20 mg total) by mouth 3 (three) times daily. 90 tablet 1   No current facility-administered medications on file prior to visit.    Review of Systems     Objective:  There were no vitals filed for this visit. BP Readings from Last 3 Encounters:  04/12/22 124/72  01/28/22 119/72  01/25/22 124/62   Wt Readings from Last 3 Encounters:  04/12/22 179 lb 12.8 oz (81.6 kg)  01/28/22 174 lb 6.4 oz (79.1 kg)  01/25/22 174 lb (78.9 kg)   There is no height or weight on file to calculate BMI.    Physical Exam         Assessment & Plan:    See Problem List for Assessment and Plan of chronic medical  problems.

## 2022-05-04 ENCOUNTER — Ambulatory Visit (INDEPENDENT_AMBULATORY_CARE_PROVIDER_SITE_OTHER): Payer: Medicare Other | Admitting: Internal Medicine

## 2022-05-04 VITALS — BP 126/68 | HR 77 | Temp 98.1°F | Ht 62.0 in | Wt 172.0 lb

## 2022-05-04 DIAGNOSIS — J209 Acute bronchitis, unspecified: Secondary | ICD-10-CM | POA: Diagnosis not present

## 2022-05-04 MED ORDER — AMOXICILLIN-POT CLAVULANATE 875-125 MG PO TABS: 1.0000 | ORAL_TABLET | Freq: Two times a day (BID) | ORAL | 0 refills | Status: AC

## 2022-05-04 NOTE — Patient Instructions (Addendum)
        Medications changes include :   Augmentin twice a day for 10 days     Return if symptoms worsen or fail to improve.  

## 2022-05-12 ENCOUNTER — Ambulatory Visit
Admission: RE | Admit: 2022-05-12 | Discharge: 2022-05-12 | Disposition: A | Discharge status: Home / Self Care | Payer: Medicare Other | Source: Ambulatory Visit | Attending: Nurse Practitioner | Admitting: Nurse Practitioner

## 2022-05-12 DIAGNOSIS — Z1231 Encounter for screening mammogram for malignant neoplasm of breast: Secondary | ICD-10-CM

## 2022-05-13 ENCOUNTER — Telehealth: Payer: Self-pay | Admitting: Nurse Practitioner

## 2022-05-15 NOTE — Progress Notes (Deleted)
    Subjective:    Patient ID: Chelsea Callahan, female    DOB: 02/17/1951, 72 y.o.   MRN: [id number]      HPI Chelsea Callahan is here for No chief complaint on file.   Fluid pills -      Medications and allergies reviewed with patient and updated if appropriate.  Current Outpatient Medications on File Prior to Visit  Medication Sig Dispense Refill   aspirin EC 81 MG tablet Take 81 mg by mouth daily. Swallow whole.     Black Pepper-Turmeric (TURMERIC CURCUMIN) 08-998 MG CAPS Take 1,000 mg by mouth daily.     Cholecalciferol ([employer]S D3) 25 MCG (1000 UT) capsule Take 1,000 Units by mouth daily.     Fe Bisgly-Succ-C-Thre-B12-FA (IRON-150 PO) Take 1 tablet by mouth daily.     hydroxychloroquine (PLAQUENIL) 200 MG tablet TAKE 1 TABLET BY MOUTH EVERY DAY 90 tablet 0   Multiple Vitamin (MULTIVITAMIN WITH MINERALS) TABS tablet Take 1 tablet by mouth daily.     mycophenolate (CELLCEPT) 500 MG tablet Take 500 mg by mouth 2 (two) times daily.     OVER THE COUNTER MEDICATION Take 1 capsule by mouth daily. Sea Moss     psyllium (METAMUCIL) 58.6 % packet Take 1 packet by mouth daily as needed (constipation).     sildenafil (REVATIO) 20 MG tablet Take 1 tablet (20 mg total) by mouth 3 (three) times daily. 90 tablet 1   No current facility-administered medications on file prior to visit.    Review of Systems     Objective:  There were no vitals filed for this visit. BP Readings from Last 3 Encounters:  05/04/22 126/68  04/12/22 124/72  01/28/22 119/72   Wt Readings from Last 3 Encounters:  05/04/22 172 lb (78 kg)  04/12/22 179 lb 12.8 oz (81.6 kg)  01/28/22 174 lb 6.4 oz (79.1 kg)   There is no height or weight on file to calculate BMI.    Physical Exam         Assessment & Plan:    See Problem List for Assessment and Plan of chronic medical problems.

## 2022-05-16 ENCOUNTER — Ambulatory Visit: Payer: Medicare Other | Admitting: Internal Medicine

## 2022-05-19 ENCOUNTER — Ambulatory Visit (INDEPENDENT_AMBULATORY_CARE_PROVIDER_SITE_OTHER): Payer: Medicare Other | Admitting: Nurse Practitioner

## 2022-05-19 VITALS — BP 122/68 | HR 61 | Temp 98.5°F | Ht 62.0 in | Wt 179.5 lb

## 2022-05-19 DIAGNOSIS — R053 Chronic cough: Secondary | ICD-10-CM

## 2022-05-19 DIAGNOSIS — E2839 Other primary ovarian failure: Secondary | ICD-10-CM

## 2022-05-19 NOTE — Progress Notes (Signed)
Established Patient Office Visit  Subjective   Patient ID: Chelsea Callahan, female    DOB: [birth year]  Age: 72 y.o. MRN: [id number]  Chief Complaint  Patient presents with   Cough    Original symptom onset 3 weeks ago, has completed course of Augmentin, symptoms start in mid afternoon and get persistently worse throughout the evening.  Main symptoms are postnasal drip, cough that sometimes dry sometimes moist.  She expelled a significant amount of phlegm in the morning.  Has tried Mucinex which resulted in some symptom relief, she also took a DayQuil which resulted in her feeling sleepy.  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  Currently on hydroxychloroquine and CellCept.  Due for DEXA scan to screen for osteoporosis.  Patient is postmenopausal, last DEXA scan completed in 2018 which showed osteopenia.    Review of Systems  Constitutional:  Negative for fever.  HENT:  Positive for sore throat. Negative for congestion.   Respiratory:  Positive for cough, sputum production and shortness of breath (chronic no worse). Negative for wheezing.   Cardiovascular:  Negative for chest pain.      Objective:     BP 122/68   Pulse 61   Temp 98.5 F (36.9 C) (Temporal)   Ht 5\' 2"  (1.575 m)   Wt 179 lb 8 oz (81.4 kg)   SpO2 94%   BMI 32.83 kg/m    Physical Exam Vitals reviewed.  Constitutional:      General: She is not in acute distress.    Appearance: Normal appearance.  HENT:     Head: Normocephalic and atraumatic.  Neck:     Vascular: No carotid bruit.  Cardiovascular:     Rate and Rhythm: Normal rate and regular rhythm.     Pulses: Normal pulses.     Heart sounds: Normal heart sounds.  Pulmonary:     Effort: Pulmonary effort is normal.     Breath sounds: Normal breath sounds.  Skin:    General: Skin is warm and dry.  Neurological:     General: No focal deficit present.     Mental Status: She is alert and  oriented to person, place, and time.  Psychiatric:        Mood and Affect: Mood normal.        Behavior: Behavior normal.        Judgment: Judgment normal.      No results found for any visits on 05/19/22.    The ASCVD Risk score (Arnett DK, et al., 2019) failed to calculate for the following reasons:   Cannot find a previous HDL lab   Cannot find a previous total cholesterol lab    Assessment & Plan:   Problem List Items Addressed This Visit       Other   Chronic cough - Primary    Patient has persistent cough, not sure if this is postviral cough versus bacterial origin however clinically no crackles actually noted on exam today, vital signs stable and patient is not hypoxic.  Will reach out to pulmonologist regarding treatment recommendations.  I will suggest symptom management with albuterol inhaler, Tessalon Perles, Promethazine DM, and possibly course of doxycycline.  May also consider consultation with supervising physician regarding treatment plan as well.  Patient and I also discussed possibly doing x-ray today, but due to frequent high-resolution CT scans for monitoring of ILD which results in radiation exposure and no adventitious lung sounds noted  on exam today per shared decision making we will hold off on x-ray for now.  Further recommendations will be made after consultation with colleagues.      Estrogen deficiency    DEXA scan ordered, further recommendations may be made based upon his results.      Relevant Orders   DG Bone Density    Return in about 3 months (around 08/18/2022) for as scheduled.  Total time spent on encounter today was 38 minutes including face-to-face interaction, chart review, development and discussion of treatment plan.   Ailene Ards, NP

## 2022-05-19 NOTE — Patient Instructions (Signed)
Benzonatate - cough suppressant should not make you sleepy Promethazine-dextromethorphan - cough syrup can cause drowsiness so no driving/operating heavy machinery

## 2022-05-19 NOTE — Assessment & Plan Note (Signed)
Patient has persistent cough, not sure if this is postviral cough versus bacterial origin however clinically no crackles actually noted on exam today, vital signs stable and patient is not hypoxic.  Will reach out to pulmonologist regarding treatment recommendations.  I will suggest symptom management with albuterol inhaler, Tessalon Perles, Promethazine DM, and possibly course of doxycycline.  May also consider consultation with supervising physician regarding treatment plan as well.  Patient and I also discussed possibly doing x-ray today, but due to frequent high-resolution CT scans for monitoring of ILD which results in radiation exposure and no adventitious lung sounds noted on exam today per shared decision making we will hold off on x-ray for now.  Further recommendations will be made after consultation with colleagues.

## 2022-05-19 NOTE — Assessment & Plan Note (Signed)
DEXA scan ordered, further recommendations may be made based upon his results.

## 2022-05-20 ENCOUNTER — Other Ambulatory Visit: Payer: Self-pay | Admitting: Nurse Practitioner

## 2022-05-20 DIAGNOSIS — R053 Chronic cough: Secondary | ICD-10-CM

## 2022-05-20 MED ORDER — PROMETHAZINE-DM 6.25-15 MG/5ML PO SYRP: 5.0000 mL | ORAL_SOLUTION | Freq: Every evening | ORAL | 0 refills | Status: DC | PRN

## 2022-05-20 MED ORDER — ALBUTEROL SULFATE HFA 108 (90 BASE) MCG/ACT IN AERS: 2.0000 | INHALATION_SPRAY | Freq: Four times a day (QID) | RESPIRATORY_TRACT | 0 refills | Status: DC | PRN

## 2022-05-20 MED ORDER — PREDNISONE 20 MG PO TABS: 20.0000 mg | ORAL_TABLET | Freq: Every day | ORAL | 0 refills | Status: DC

## 2022-05-20 MED ORDER — BENZONATATE 100 MG PO CAPS: 100.0000 mg | ORAL_CAPSULE | Freq: Three times a day (TID) | ORAL | 0 refills | Status: DC | PRN

## 2022-05-20 NOTE — Progress Notes (Signed)
Please call patient and let her know that I have not heard back from her pulmonologist, but I did consult with my supervising physician and have decided to recommend we treat the cough with cough suppressants (tessalon capsule every 8 hours as needed, cough syrup at night as needed), albuterol inhaler as needed for chest tightness or shortness of breath, and a short course of prednisone (1 tablet by mouth every morning with breakfast for 5 days).  She should take prednisone with food, if she needs anything for pain while on prednisone only take Tylenol to avoid increased risk of bleeding in her stomach.  Prednisone may make it hard to sleep as well as increased appetite and irritability.  Have decided to hold off on antibiotic therapy for now unless symptoms worsen.  This would be defined as spiking a fever, shortness of breath, further increase in sputum production.  I have sent the medications into her pharmacy, please let me know if she has any questions.

## 2022-05-20 NOTE — Progress Notes (Signed)
Notified pt w/ Sarah response../[email or web address] 

## 2022-05-23 ENCOUNTER — Other Ambulatory Visit (HOSPITAL_COMMUNITY): Payer: Self-pay | Admitting: Internal Medicine

## 2022-06-08 DIAGNOSIS — M359 Systemic involvement of connective tissue, unspecified: Secondary | ICD-10-CM | POA: Diagnosis not present

## 2022-06-08 DIAGNOSIS — M1990 Unspecified osteoarthritis, unspecified site: Secondary | ICD-10-CM | POA: Diagnosis not present

## 2022-06-08 DIAGNOSIS — I7300 Raynaud's syndrome without gangrene: Secondary | ICD-10-CM | POA: Diagnosis not present

## 2022-06-08 DIAGNOSIS — J849 Interstitial pulmonary disease, unspecified: Secondary | ICD-10-CM | POA: Diagnosis not present

## 2022-06-08 DIAGNOSIS — Z79899 Other long term (current) drug therapy: Secondary | ICD-10-CM | POA: Diagnosis not present

## 2022-06-08 DIAGNOSIS — R768 Other specified abnormal immunological findings in serum: Secondary | ICD-10-CM | POA: Diagnosis not present

## 2022-07-01 ENCOUNTER — Telehealth: Payer: Self-pay | Admitting: Internal Medicine

## 2022-07-01 ENCOUNTER — Telehealth: Payer: Self-pay | Admitting: Nurse Practitioner

## 2022-07-01 NOTE — Telephone Encounter (Signed)
Fax received from Dr. Bjorn Pippin with [facility] to perform a Dental Implants on patient.  Patient needs surgery clearance. Surgery is PENDING. Patient was seen on 04/12/2022. Office protocol is a risk assessment can be sent to surgeon if patient has been seen in 60 days or less.   Sending to Dr Chase Caller for risk assessment or recommendations if patient needs to be seen in office prior to surgical procedure.    Called patient and got her scheduled for office visit with MR on 07/07/2022.

## 2022-07-01 NOTE — Telephone Encounter (Signed)
Please call patient and let her know I received paperwork requesting that she have a pre-operative clearance visit 2 weeks prior to her upcoming procedure. Please schedule her accordingly for a 40 minute visit.

## 2022-07-01 NOTE — Telephone Encounter (Signed)
I will see her 07/07/22. Thanks and then decide

## 2022-07-04 NOTE — Telephone Encounter (Signed)
Made pt aware and is schedule for it

## 2022-07-05 ENCOUNTER — Ambulatory Visit (HOSPITAL_COMMUNITY)
Admission: RE | Admit: 2022-07-05 | Discharge: 2022-07-05 | Disposition: A | Discharge status: Home / Self Care | Payer: Medicare Other | Source: Ambulatory Visit | Attending: Nurse Practitioner | Admitting: Nurse Practitioner

## 2022-07-05 DIAGNOSIS — M359 Systemic involvement of connective tissue, unspecified: Secondary | ICD-10-CM | POA: Diagnosis not present

## 2022-07-05 DIAGNOSIS — R911 Solitary pulmonary nodule: Secondary | ICD-10-CM | POA: Diagnosis not present

## 2022-07-05 DIAGNOSIS — J8489 Other specified interstitial pulmonary diseases: Secondary | ICD-10-CM | POA: Insufficient documentation

## 2022-07-05 DIAGNOSIS — I2510 Atherosclerotic heart disease of native coronary artery without angina pectoris: Secondary | ICD-10-CM | POA: Diagnosis not present

## 2022-07-05 DIAGNOSIS — I517 Cardiomegaly: Secondary | ICD-10-CM | POA: Diagnosis not present

## 2022-07-05 DIAGNOSIS — I700 Atherosclerosis of aorta: Secondary | ICD-10-CM | POA: Diagnosis not present

## 2022-07-05 DIAGNOSIS — J849 Interstitial pulmonary disease, unspecified: Secondary | ICD-10-CM | POA: Diagnosis not present

## 2022-07-06 DIAGNOSIS — D899 Disorder involving the immune mechanism, unspecified: Secondary | ICD-10-CM | POA: Diagnosis not present

## 2022-07-06 DIAGNOSIS — H2513 Age-related nuclear cataract, bilateral: Secondary | ICD-10-CM | POA: Diagnosis not present

## 2022-07-06 DIAGNOSIS — H04123 Dry eye syndrome of bilateral lacrimal glands: Secondary | ICD-10-CM | POA: Diagnosis not present

## 2022-07-06 DIAGNOSIS — Z79899 Other long term (current) drug therapy: Secondary | ICD-10-CM | POA: Diagnosis not present

## 2022-07-07 ENCOUNTER — Encounter: Payer: Self-pay | Admitting: Internal Medicine

## 2022-07-07 ENCOUNTER — Ambulatory Visit (INDEPENDENT_AMBULATORY_CARE_PROVIDER_SITE_OTHER): Payer: Medicare Other | Admitting: Internal Medicine

## 2022-07-07 VITALS — BP 138/76 | HR 77 | Temp 98.3°F | Ht 62.0 in | Wt 178.2 lb

## 2022-07-07 DIAGNOSIS — Z79899 Other long term (current) drug therapy: Secondary | ICD-10-CM

## 2022-07-07 DIAGNOSIS — J8489 Other specified interstitial pulmonary diseases: Secondary | ICD-10-CM

## 2022-07-07 DIAGNOSIS — M359 Systemic involvement of connective tissue, unspecified: Secondary | ICD-10-CM

## 2022-07-07 DIAGNOSIS — I2721 Secondary pulmonary arterial hypertension: Secondary | ICD-10-CM

## 2022-07-07 DIAGNOSIS — Q998 Other specified chromosome abnormalities: Secondary | ICD-10-CM

## 2022-07-07 DIAGNOSIS — R768 Other specified abnormal immunological findings in serum: Secondary | ICD-10-CM

## 2022-07-07 DIAGNOSIS — Z01811 Encounter for preprocedural respiratory examination: Secondary | ICD-10-CM

## 2022-07-07 DIAGNOSIS — I7300 Raynaud's syndrome without gangrene: Secondary | ICD-10-CM

## 2022-07-07 NOTE — Patient Instructions (Addendum)
ICD-10-CM   1. Interstitial lung disease due to connective tissue disease ([place])  J84.89    M35.9     2. Raynaud's disease without gangrene  I73.00     3. ANA positive  R76.8     4. 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 On Cellcept therapy  Z79.899     6. Immunosuppressed status ([place])  D84.9     7. 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 Immunosuppressed status ([place])  - clinically stable  - tolerating well cellcept '500mg'$  twice daily since May/June 2023 via Dr Kathlene November - 2nd opinion at [facility] Dr Randol Kern - Nov 2023: continue current course - Visit March 2024 07/07/2022 - Stable fibrosis on CT and symptos and walk  Plan - continue cellcept via Dr Kathlene November   - do spirometry and dlco in 3 months - monitor symptoms  -any worsening then consider adding new medications (ofev or Rituxan or increaseing cellcept)  - keep [place] posted about any respiratory infections  Pulmonary Hypertension  - stable  on revatio  Plan  Conitnue revatio through Dr Jeffie Pollock  20m Lingular Nodule March 2023 -> no change MArch 2024  Plan  - HRCT for ILD as needed  Preoperative Respiratory Evaluation  =- upcoming peridonitis surgery with implant removal  Plan - hold cellcept 7-10 days before surgery and start 7 days after surgery   Followup  - 3 months after spiromery and dlco with Dr RChase Caller 30 min visit  - symptoms score and walk test at followup

## 2022-07-07 NOTE — Progress Notes (Signed)
OV 08/26/2021 - new ILD [facility]. Referred by Dr Bo Merino  Subjective:  Patient ID: Chelsea Callahan, female , DOB: 18-Feb-1951 , age 72 y.o. , MRN: [id number] , ADDRESS: [place] [address] PCP Ailene Ards, NP Patient Care Team: Ailene Ards, NP as PCP - General (Nurse Practitioner)  This Provider for this visit: Treatment Team:  Attending Provider: Brand Males, MD    08/26/2021 -   Chief Complaint  Patient presents with   Consult    Referred for possible ILD     HPI Chelsea Callahan 72 y.o. -referred by Dr Armanda Heritage.  72 year old female.  She used to live in [place] and [place].  She is a retired Optometrist.  She was a city Barista.  Then in 2013 she relocated to [facility] along with her husband.  This is for her retirement.  She tells me that approximately 13 years ago she developed Raynaud's symptoms.  She also started having some alopecia may be a year or 2 before that.  This then resulted in a positive ANA testing.  She then started following with a rheumatologist for 5 years before she relocated to [facility] and for the last 8 or 9 years has established care with Dr. Estanislado Pandy.  Her ANA here is also positive [see below].  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  She is followed periodically.  She is on Plaquenil.  She has arthritis but this is osteoarthritis.  She denies any scleroderma findings or rheumatoid arthritis findings of malar rash or oral ulcers or hematuria or other connective tissue disease findings beyond the Raynaud's.  During the course of routine follow-up this year she was discovered to have crackles.  The notes also confirmed this.  This resulted in a high-resolution CT chest that shows interstitial lung disease predominantly with a craniocaudal gradient.  Reticulation and some traction  bronchiectasis but no honeycombing.  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  Therefore she has been referred here.  She tells me that she prefers to be very aggressive with the treatment and management approach.  She has a life goal of living up to 46 years.  Currently she is able to walk 2 miles every 2 weeks.  She never stops.   She does not feel much short of breath.  Initially she has some chest tightness and this resolves.  Is not much of a cough.  Past medical history - Negative for any specific connective tissue disease.  Negative for hiatal hernia and acid reflux. - Positive for ANA + and low titer rheumatoid factor [discussion with Dr. Keturah Barre today:: Undifferentiated connective tissue disease:] - She has had the COVID disease in January 2022.  She also had COVID-vaccine 4 shots.  Review of systems - Raynaud's present - Osteoarthritis present  Family history of pulmonary disease - Brother has myasthenia gravis but otherwise negative  Exposure history - Smoked from [age] although in between she stopped between [place].  3 to 5 cigarettes/day.  She did some marijuana as a teenager.  Otherwise no cocaine use or no intravenous drug use.  [place]  She has lived there for 2 years.  Is a private home.  Detail organic antigen exposure history in this house is  negative.  She is never used feather pillow or down jackets.  Occupational history - [place] [employer].  Detail organic and inorganic antigen exposure history at work is negative  Pulmonary toxicity history - Denies any adverse medication intake    AUOTIMMUNE   Latest Reference Range & Units 10/17/16 12:14 08/17/17 14:55 03/07/18 14:48 07/03/19 11:32 06/16/20 14:15 11/13/20 00:00 07/06/21 15:20  Anti Nuclear Antibody (ANA) NEGATIVE        POSITIVE !  ANA Pattern 1        Nuclear, Nucleolar !  ANA Titer 1 titer       1:1,280 (H)  ds DNA Ab  IU/mL '1 1 1 1 1 1 1  '$ ENA SM Ab Ser-aCnc <1.0 NEG AI       <1.0 NEG  !: Data is abnormal (H): Data is abnormally high  Latest Reference Range & Units 07/06/21 15:20  Ribonucleic Protein(ENA) Antibody, IgG <1.0 NEG AI <1.0 NEG  ENA SM Ab Ser-aCnc <1.0 NEG AI <1.0 NEG  SSA (Ro) (ENA) Antibody, IgG <1.0 NEG AI <1.0 NEG  SSB (La) (ENA) Antibody, IgG <1.0 NEG AI <1.0 NEG  Scleroderma (Scl-70) (ENA) Antibody, IgG <1.0 NEG AI <1.0 NEG    Latest Reference Range & Units 10/17/16 12:14 08/17/17 14:55 03/07/18 14:48 07/03/19 11:32 06/16/20 14:15 11/13/20 00:00 07/06/21 15:20  Sed Rate 0 - 30 mm/h  '9 1 6 6 9 11  '$ Anticardiolipin Ab,IgA,Qn APL <11 <11       Anticardiolipin Ab,IgG,Qn GPL <14 <14       Anticardiolipin Ab,IgM,Qn MPL <12 <12        CT Chest data - HRCT March 2023  Narrative & Impression  CLINICAL DATA:  Autoimmune disease, crackles in chest.   EXAM: CT CHEST WITHOUT CONTRAST   TECHNIQUE: Multidetector CT imaging of the chest was performed following the standard protocol without intravenous contrast. High resolution imaging of the lungs, as well as inspiratory and expiratory imaging, was performed.   RADIATION DOSE REDUCTION: This exam was performed according to the departmental dose-optimization program which includes automated exposure control, adjustment of the mA and/or kV according to patient size and/or use of iterative reconstruction technique.   COMPARISON:  None.   FINDINGS: Cardiovascular: Atherosclerotic calcification of the aorta, aortic valve and coronary arteries. Enlarged right and left pulmonary arteries and heart. Small amount of pericardial fluid may be physiologic.   Mediastinum/Nodes: Mediastinal lymph nodes measure up to 10 mm in the AP window. Hilar regions are difficult to evaluate without IV contrast. Small axillary and subpectoral lymph nodes with the largest measuring 1.4 cm in the right axilla. Esophagus is grossly unremarkable.    Lungs/Pleura: Peripheral basilar subpleural reticulation, ground-glass and traction bronchiectasis/bronchiolectasis. No definitive honeycombing. Probable subpleural lymph nodes along the right major fissure, measuring up to 5 mm. Calcified granulomas. Noncalcified 5 mm lingular nodule (4 x 6 mm, 6/73). No pleural fluid. Airway is unremarkable. Mild air trapping.   Upper Abdomen: Visualized portion of the liver is unremarkable. Stone in the gallbladder. Visualized portions of the adrenal glands, kidneys, spleen and pancreas are grossly unremarkable. Gastric bypass.   Musculoskeletal: Degenerative changes in the spine. Old right rib fractures. No worrisome lytic or sclerotic lesions.   IMPRESSION: 1. Pulmonary parenchymal pattern of interstitial lung disease may be due to usual interstitial pneumonitis or nonspecific interstitial pneumonitis. Findings are categorized as probable UIP per consensus guidelines: Diagnosis of Idiopathic Pulmonary Fibrosis: An Official ATS/ERS/JRS/ALAT Clinical Practice Guideline. [place]  Med Vol 198, Iss 5, [id number], Dec 31 2016. 2. Small amount of pericardial fluid may be physiologic. 3. 5 mm lingular nodule. If the patient is high-risk, a non-contrast chest CT can be considered in 12 months.This recommendation follows the consensus statement: Guidelines for Management of Incidental Pulmonary Nodules Detected on CT Images: From the Fleischner Society 2017; Radiology 2017; 284:228-243. 4. Small to borderline enlarged mediastinal and axillary lymph nodes. Difficult to exclude a lymphoproliferative disorder. 5. Cholelithiasis. 6. Aortic atherosclerosis (ICD10-I70.0). Coronary artery calcification. 7. Enlarged pulmonary arteries, indicative of pulmonary arterial hypertension. 8.  Emphysema (ICD10-J43.9).     Electronically Signed   By: Lorin Picket M.D.   On: 07/19/2021 10:34         10/08/2021 Patient presents today for  follow-up.  She has a history of undifferentiated connective tissue disease.  She follows with rheumatology.  HRCT imaging in March showed pulmonary parenchymal pattern of interstitial lung disease, findings are categorized as probable UIP.  One antibody in myositis panel was positive.  Recently been placed on CellCept by Dr. Kathlene November with Rheumatology, awaiting results of hep panel before starting medication.  Pulmonary function testing today was normal.  Simple walk test without oxygen desaturation.  She is largely asymptomatic.  She walks on average 2 miles a day 5 times a week.  Only respiratory complaint is some dyspnea symptoms about halfway through her walk.  She has no significant shortness of breath, cough, chest tightness or wheezing.  Pulmonary function testing 10/08/2021 FVC 2.09 (107%), FEV1 1.87 (125%), ratio 90, TLC 81%, DLCOunc 102%  RHC 10/27/21  Findings:   RA = 8 RV = 45/11 PA = 45/13 (28) PCW = 17 Fick cardiac output/index = 5.2/2.9 PVR = 2.1 FA sat = 97% PA sat = 69%, 70% SVC sat = 66%   Assessment: 1. 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 Chase Caller. Likely start macitentan    Glori Bickers, MD  1:40 PM   OV 01/25/2022  Subjective:  Patient ID: Chelsea Callahan, female , DOB: Apr 07, 1951 , age 78 y.o. , MRN: [id number] , ADDRESS: [place] [address] PCP Ailene Ards, NP Patient Care Team: Ailene Ards, NP as PCP - General (Nurse Practitioner)  This Provider for this visit: Treatment Team:  Attending Provider: Brand Males, MD    01/25/2022 -   Chief Complaint  Patient presents with   Follow-up    PFT performed 8/11 at [facility].  Pt states she has been doing okay since last visit and denies any complaints.   HPI Chelsea Callahan 71 y.o. -returns for follow-up.  Since I last personally met her she has now been started on CellCept after switching rheumatologist to [facility] Dr. Kathlene November.  This is on account of positive ANA and also positive myositis antibody and Raynaud's.  She is also had a right heart catheterization.  She has been started on room audio.  She continues to be very minimally symptomatic or asymptomatic from the shortness of breath cough and fatigue standpoint.  She she also wanted to get a second opinion for lung disease.  She is taken an appointment Dr. Wynn Maudlin at [facility].  She did have pulmonary function test in August 2023.  Some except for admit as documented below.  However when she went there they only had a pulmonary function test scheduled but not the office appointment.  The office appointment  is now being scheduled for March 03, 2022 which is 3 months since the pulmonary function test.  Also at this time she is now on CellCept.  We took a shared decision making that she will have 1 more pulmonary function test it was the end of October 2023 to give a sequential timeline for Dr. Randol Kern to help with his decision making.  We discussed the fact that nintedanib would be the next agent and whether we will start it now or wait till if any progression.  This will be a question that would be post to Dr. Randol Kern.  The pulmonary function test data should help.  We discussed her respiratory vaccines but currently she wants to hold off.  Of note she has a 5 mm lingular nodule in March 2023.  She is a remote smoker.  We will get a repeat CT scan of the chest in spring or summer 2024.    PFT  OV 04/12/2022  Subjective:  Patient ID: Chelsea Callahan, female , DOB: [birth year] , age 50 y.o. , MRN: [id number] , ADDRESS: [place] [phone] PCP Ailene Ards, NP Patient Care Team: Ailene Ards, NP as PCP - General (Nurse Practitioner)  This Provider for this visit: Treatment Team:  Attending Provider: Brand Males, MD    04/12/2022 -   Chief Complaint  Patient presents with   Follow-up    Pt  states she has been doing okay since last visit. States she has had an infection in her mouth that she states her dentist does not know how to treat.     HPI Chelsea Callahan 72 y.o. -returns for follow-up.  She continues on CellCept 100 mg twice daily.  After seeing me last time she had pulmonary function test that shows stability [see below].  She then saw Dr. Wynn Maudlin.  He advised continuing current course with CellCept 5 mg twice daily and then consider add-on therapy.  They discussed biopsy.  But currently that was being withheld.  He also felt it was too early for lung transplant.  He did weigh in on adding a second drug of going up on CellCept but in balance they took a shared decision making to continue current course.  Currently she is content continuing the current course.  She has seen Dr. Kathlene November in rheumatology recently and had blood work for her CellCept and she said it is normal.  We again discussed vaccines but she declined.  I did emphasize the risk that comes with respiratory infections in the setting of immunosuppression pulmonary fibrosis but she prefers to defer vaccination.  I then advised her to stay away from human clusters especially during the fall and winter and avoid sick contacts.  She verbalized understanding.  We discussed continued serial monitoring.  She is most accepting of this plan.  She will continue her CellCept at the current dose.  We discussed about ways to monitor her symptoms and if there is any change in her shortness of breath between now and the next visit she will let us know.     OV 07/07/2022  Subjective:  Patient ID: Chelsea Callahan, female , DOB: 08-27-1950 , age 86 y.o. , MRN: [id number] , ADDRESS: [place] [phone] PCP Ailene Ards, NP Patient Care Team: Ailene Ards, NP as PCP - General (Nurse Practitioner)  This Provider for this visit: Treatment Team:  Attending Provider: Brand Males, MD   #Undifferentiated  connective tissue disease  -  Myositis antibody negative's 2023 - Raynaud's + -ANA +2023 -On CellCept through Dr. Kathlene November since October 05, 2021 -Also on [place] second opinion Dr. 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  -Crackles on presentation  -Very mild burden.  Essentially asymptomatic normal PFTs 2023  #Mild pulmonary hypertension right heart cath June 2023 --Dr. Haroldine Laws  -Pulmonary capillary wedge pressure 17, PVR 2.1, PA mean 28  -Revatio started by Dr. Haroldine Laws fper history  #5 mm lingula nodule March 2023 -> march 2024 without change   07/07/2022 -   Chief Complaint  Patient presents with   Follow-up    Ct f/u      HPI Chelsea Callahan 72 y.o. -returns for follow-up.  She continues to be immunosuppressed.  She takes CellCept regularly.  She feels stable minimal symptoms she walks 2 miles per day.  Her husband is here with her.  She is very anxious about his CT scan results which shows continued stability of her ILD.  In the same context her symptom score is unchanged and a walking desaturation test is stable.  Her last pulmonary function test was in October 2023.  She is really relieved about the result.  Of note she has a 5 mm lingular nodule and this is also stable.  She says she has upcoming dental surgery.  She has a dental implant that 58 or 72 years old.  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  She is being recommended extraction and then replacement at least partially.  She is nervous about this but she has noticed pus intermittently coming from her gums.  This past predates her starting CellCept.  I have indicated to her to hold the CellCept before and after the surgery to be extra cautious.  Did discuss the risk would be progressive ILD but the risk would be low given current stability.     SYMPTOM SCALE - ILD 01/25/2022 01/25/2022  04/12/2022  07/07/2022   Current weight      O2 use ra ra ra ra   Shortness of Breath 0 -> 5 scale with 5 being worst (score 6 If unable to do)     At rest 000 0 0 0  Simple tasks - showers, clothes change, eating, shaving 0 0 0 0  Household (dishes, doing bed, laundry) 0 0 1 0  Shopping 00 0 1 0  Walking level at own pace 0 0 1 0  Walking up Stairs 0  1+ 1.5  Total (30-36) Dyspnea Score 0 00 4 1.5  How bad is your cough? 0 0 0 0  How bad is your fatigue 0 0 1 0  How bad is nausea 0 0 0 0  How bad is vomiting?  0 0 0 0  How bad is diarrhea? 0 0 0 0  How bad is anxiety? 0 0 2 4  How bad is depression '2 2 1 1  '$ Any chronic pain - if so where and how bad 1 1 x x      Simple office walk 185 feet x  3 laps goal with forehead probe 08/26/2021  10/08/2021  07/07/2022   O2 used ra RA ra  Number laps completed C2 of '3 3 3  '$ Comments about pace x normal   Resting Pulse Ox/HR 100% and 83/min 100% and 58/min 100% and HR 72  Final Pulse Ox/HR 100% and 130/min 100% and 133/min 100% and HR 137  Desaturated </= 88% no No  Desaturated <= 3% points no No   Got Tachycardic >/= 90/min yes Yes   Symptoms at end of test x x   Miscellaneous comments x x    CT Chest data  CT Chest High Resolution  Result Date: 07/06/2022 CLINICAL DATA:  Interstitial lung disease EXAM: CT CHEST WITHOUT CONTRAST TECHNIQUE: Multidetector CT imaging of the chest was performed following the standard protocol without intravenous contrast. High resolution imaging of the lungs, as well as inspiratory and expiratory imaging, was performed. RADIATION DOSE REDUCTION: This exam was performed according to the departmental dose-optimization program which includes automated exposure control, adjustment of the mA and/or kV according to patient size and/or use of iterative reconstruction technique. COMPARISON:  07/16/2021 FINDINGS: Cardiovascular: Aortic atherosclerosis. Cardiomegaly. Three-vessel coronary artery calcifications. No pericardial effusion. Mediastinum/Nodes: No enlarged mediastinal, hilar,  or axillary lymph nodes. Thyroid gland, trachea, and esophagus demonstrate no significant findings. Lungs/Pleura: Today's examination is generally somewhat limited by breath motion artifact within this limitation, no obvious interval change in mild pulmonary fibrosis in a pattern with apical to basal gradient, featuring irregular peripheral interstitial opacity, septal thickening, and small areas of subpleural bronchiolectasis at the lung bases without clear evidence of honeycombing. No significant air trapping on expiratory phase imaging. Unchanged 0.5 cm nodule of the posterior lingula, benign, for which no further follow-up or characterization is required (series 4, image 136). No pleural effusion or pneumothorax. Upper Abdomen: No acute abnormality. Musculoskeletal: No chest wall abnormality. No acute osseous findings. IMPRESSION: 1. Today's examination is generally somewhat limited by breath motion artifact within this limitation, no obvious interval change in mild pulmonary fibrosis in a pattern with apical to basal gradient, featuring irregular peripheral interstitial opacity, septal thickening, and small areas of subpleural bronchiolectasis at the lung bases without clear evidence of honeycombing. Findings are categorized as probable UIP per consensus guidelines: Diagnosis of Idiopathic Pulmonary Fibrosis: An Official ATS/ERS/JRS/ALAT Clinical Practice Guideline. [place], Iss 5, [phone], Dec 31 2016. 2. Unchanged 0.5 cm nodule of the posterior lingula, benign, for which no further follow-up or characterization is required. 3. Cardiomegaly and coronary artery disease. Aortic Atherosclerosis (ICD10-I70.0). Electronically Signed   By: Delanna Ahmadi M.D.   On: 07/06/2022 15:51      PFT     Latest Ref Rng & Units 02/28/2022    2:21 PM 10/08/2021   10:54 AM  PFT Results  FVC-Pre L 2.14  2.14   FVC-Predicted Pre % 84  110   FVC-Post L  2.09   FVC-Predicted Post %  107   Pre  FEV1/FVC % % 90  90   Post FEV1/FCV % %  90   FEV1-Pre L 1.93  1.93   FEV1-Predicted Pre % 101  129   FEV1-Post L  1.87   DLCO uncorrected ml/min/mmHg 17.35  17.27   DLCO UNC% % 102  102   DLCO corrected ml/min/mmHg 18.39  17.27   DLCO COR %Predicted % 108  102   DLVA Predicted % 112  104   TLC L  3.64   TLC % Predicted %  81   RV % Predicted %  63        has a past medical history of Anemia, 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), Blood transfusion without reported diagnosis, Cataract, GERD (gastroesophageal reflux disease), Primary osteoarthritis of right knee, Raynaud disease, Raynaud disease, Spinal headache, and Vasculitis ([place]).   reports that she quit smoking about 25 years ago. Her smoking use included cigarettes.  She has a 3.75 pack-year smoking history. She has been exposed to tobacco smoke. She has never used smokeless tobacco.  Past Surgical History:  Procedure Laterality Date   abdominal clip     to stomach post gastric bypass   AXILLARY LYMPH NODE BIOPSY Right 01/27/2021   Procedure: RIGHT AXILLARY LYMPH NODE BIOPSY;  Surgeon: Erroll Luna, MD;  Location: [place];  Service: General;  Laterality: Right;   CESAREAN SECTION     x 2   GASTRIC BYPASS     HERNIA REPAIR  2010   inverted hermia   HIP ARTHROPLASTY     JOINT REPLACEMENT Right 2005   hip   JOINT REPLACEMENT Bilateral    knee    KNEE ARTHROPLASTY Left 09/13/2013   Procedure: LEFT COMPUTER ASSISTED TOTAL KNEE ARTHROPLASTY;  Surgeon: Marybelle Killings, MD;  Location: [facility] [place];  Service: Orthopedics;  Laterality: Left;  Left Total Knee Arthroplasty, Computer Assist   KNEE ARTHROPLASTY Right 05/23/2014   Procedure: COMPUTER ASSISTED TOTAL KNEE ARTHROPLASTY;  Surgeon: Marybelle Killings, MD;  Location: [place];  Service: Orthopedics;  Laterality: Right;   LUMBAR LAMINECTOMY/DECOMPRESSION MICRODISCECTOMY N/A 05/13/2016   Procedure: L4-5 Decompression;  Surgeon: Marybelle Killings, MD;  Location: [place];  Service: Orthopedics;   Laterality: N/A;   RIGHT HEART CATH N/A 10/27/2021   Procedure: RIGHT HEART CATH;  Surgeon: Jolaine Artist, MD;  Location: [facility];  Service: Cardiovascular;  Laterality: N/A;   teeth etraction     TOTAL KNEE ARTHROPLASTY Right 05/22/2014   dr Lorin [facility]   TUBAL LIGATION  1981   UPPER GI ENDOSCOPY      Allergies  Allergen Reactions   Ginger Swelling and Other (See Comments)    Stomach swells   Pork-Derived Products Other (See Comments)    UNSPECIFIED "PERSONAL REASONS"   Shellfish Allergy Other (See Comments)    UNSPECIFIED "Personal reasons/dietary"    Immunization History  Administered Date(s) Administered   PFIZER Comirnaty(Gray Top)Covid-19 Tri-Sucrose Vaccine 05/21/2019, 06/11/2019, 04/05/2020, 10/13/2020   PFIZER(Purple Top)SARS-COV-2 Vaccination 05/21/2019, 06/11/2019, 04/05/2020   Zoster Recombinat (Shingrix) 03/08/2018, 05/08/2018   Zoster, Unspecified 03/08/2018, 05/08/2018    Family History  Problem Relation Age of Onset   Cancer Brother    Multiple myeloma Brother    Colon cancer Neg Hx    Esophageal cancer Neg Hx    Rectal cancer Neg Hx    Stomach cancer Neg Hx      Current Outpatient Medications:    albuterol (VENTOLIN HFA) 108 (90 Base) MCG/ACT inhaler, Inhale 2 puffs into the lungs every 6 (six) hours as needed for wheezing or shortness of breath., Disp: 8 g, Rfl: 0   aspirin EC 81 MG tablet, Take 81 mg by mouth daily. Swallow whole., Disp: , Rfl:    benzonatate (TESSALON) 100 MG capsule, Take 1 capsule (100 mg total) by mouth 3 (three) times daily as needed for cough., Disp: 20 capsule, Rfl: 0   Black Pepper-Turmeric (TURMERIC CURCUMIN) 08-998 MG CAPS, Take 1,000 mg by mouth daily., Disp: , Rfl:    Cholecalciferol (CVS D3) 25 MCG (1000 UT) capsule, Take 1,000 Units by mouth daily., Disp: , Rfl:    Fe Bisgly-Succ-C-Thre-B12-FA (IRON-150 PO), Take 1 tablet by mouth daily., Disp: , Rfl:    hydroxychloroquine (PLAQUENIL) 200 MG tablet, TAKE 1  TABLET BY MOUTH EVERY DAY, Disp: 90 tablet, Rfl: 0   Multiple Vitamin (MULTIVITAMIN WITH MINERALS) TABS tablet, Take 1 tablet by mouth daily., Disp: ,  Rfl:    mycophenolate (CELLCEPT) 500 MG tablet, Take 500 mg by mouth 2 (two) times daily., Disp: , Rfl:    OVER THE COUNTER MEDICATION, Take 1 capsule by mouth daily. [place], Disp: , Rfl:    predniSONE (DELTASONE) 20 MG tablet, Take 1 tablet (20 mg total) by mouth daily with breakfast., Disp: 5 tablet, Rfl: 0   promethazine-dextromethorphan (PROMETHAZINE-DM) 6.25-15 MG/5ML syrup, Take 5 mLs by mouth at bedtime as needed for cough. Do not drive or operate heavy machinery after taking this medication, Disp: 118 mL, Rfl: 0   psyllium (METAMUCIL) 58.6 % packet, Take 1 packet by mouth daily as needed (constipation)., Disp: , Rfl:    sildenafil (REVATIO) 20 MG tablet, TAKE 1 TABLET BY MOUTH THREE TIMES DAILY, Disp: 90 tablet, Rfl: 0      Objective:   Vitals:   07/07/22 1557  BP: 138/76  Pulse: 77  Temp: 98.3 F (36.8 C)  TempSrc: Oral  SpO2: 94%  Weight: 178 lb 3.2 oz (80.8 kg)  Height: '5\' 2"'$  (1.575 m)    Estimated body mass index is 32.59 kg/m as calculated from the following:   Height as of this encounter: '5\' 2"'$  (1.575 m).   Weight as of this encounter: 178 lb 3.2 oz (80.8 kg).  '@WEIGHTCHANGE'$ @  [employer]   07/07/22 1557  Weight: 178 lb 3.2 oz (80.8 kg)     Physical Exam    General: No distress. Looks well Neuro: Alert and Oriented x 3. GCS 15. Speech normal Psych: Pleasant Resp:  Barrel Chest - no.  Wheeze - no, Crackles - YES AT BASE, No overt respiratory distress CVS: Normal heart sounds. Murmurs - no Ext: Stigmata of Connective Tissue Disease - no HEENT: Normal upper airway. PEERL +. No post nasal drip        Assessment:       ICD-10-CM   1. Interstitial lung disease due to connective tissue disease ([place])  J84.89    M35.9     2. Raynaud's disease without gangrene  I73.00     3. ANA positive  R76.8      4. 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 On Cellcept therapy  Z79.899     6. WHO group 1 pulmonary arterial hypertension (HCC)  I27.21     7. Preop respiratory exam  Z01.811          Plan:     Patient Instructions     ICD-10-CM   1. Interstitial lung disease due to connective tissue disease ([place])  J84.89    M35.9     2. Raynaud's disease without gangrene  I73.00     3. ANA positive  R76.8     4. 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 On Cellcept therapy  Z79.899     6. Immunosuppressed status ([place])  D84.9     7. Vaccine counseling  Z71.85      Interstitial lung disease due to connective tissue disease ([place]) Raynaud's disease without gangrene ANA positive Myositis associated antibody positive On Cellcept therapy Immunosuppressed status ([place])  - clinically stable  - tolerating well cellcept '500mg'$  twice daily since May/June 2023 via Dr Kathlene November - 2nd opinion at [facility] Dr Randol Kern - Nov 2023: continue current course - Visit March 2024 07/07/2022 - Stable fibrosis on CT and symptos and walk  Plan - continue cellcept via Dr Kathlene November   - do spirometry and dlco in 3 months -  monitor symptoms  -any worsening then consider adding new medications (ofev or Rituxan or increaseing cellcept)  - keep [place] posted about any respiratory infections  Pulmonary Hypertension  - stable  on revatio  Plan  Conitnue revatio through Dr Jeffie Pollock  64m Lingular Nodule March 2023 -> no change MArch 2024  Plan  - HRCT for ILD as needed  Preoperative Respiratory Evaluation  =- upcoming peridonitis surgery with implant removal  Plan - hold cellcept 7-10 days before surgery and start 7 days after surgery   Followup  - 3 months after spiromery and dlco with Dr RChase Caller 30 min visit  - symptoms score and walk test at followup    SIGNATURE    Dr. MBrand Males M.D., F.C.C.P,  Pulmonary and Critical Care Medicine Staff Physician, CSan SabaDirector - Interstitial Lung Disease  Program  [place] [address] Pager: [phone] If no answer or between  15:00h - 7:00h: call [phone] Telephone: [phone]  4:46 PM 07/07/2022

## 2022-07-08 NOTE — Telephone Encounter (Signed)
OV notes and clearance form have been faxed back to [facility]. Nothing further needed at this time.

## 2022-07-18 ENCOUNTER — Other Ambulatory Visit (HOSPITAL_COMMUNITY): Payer: Self-pay | Admitting: Internal Medicine

## 2022-07-28 ENCOUNTER — Ambulatory Visit (INDEPENDENT_AMBULATORY_CARE_PROVIDER_SITE_OTHER): Payer: Medicare Other | Admitting: Nurse Practitioner

## 2022-07-28 VITALS — BP 114/70 | HR 64 | Temp 97.8°F | Ht 62.0 in | Wt 179.5 lb

## 2022-07-28 DIAGNOSIS — Z01818 Encounter for other preprocedural examination: Secondary | ICD-10-CM | POA: Diagnosis not present

## 2022-07-28 LAB — COMPREHENSIVE METABOLIC PANEL
ALT: 21 U/L (ref 0–35)
AST: 35 U/L (ref 0–37)
Albumin: 4.5 g/dL (ref 3.5–5.2)
Alkaline Phosphatase: 67 U/L (ref 39–117)
BUN: 13 mg/dL (ref 6–23)
CO2: 29 mEq/L (ref 19–32)
Calcium: 10.6 mg/dL — ABNORMAL HIGH (ref 8.4–10.5)
Chloride: 104 mEq/L (ref 96–112)
Creatinine, Ser: 0.74 mg/dL (ref 0.40–1.20)
GFR: 81.57 mL/min (ref >60.00)
Glucose, Bld: 95 mg/dL (ref 70–99)
Potassium: 4.3 mEq/L (ref 3.5–5.1)
Sodium: 138 mEq/L (ref 135–145)
Total Bilirubin: 0.4 mg/dL (ref 0.2–1.2)
Total Protein: 7.3 g/dL (ref 6.0–8.3)

## 2022-07-28 LAB — CBC
HCT: 37.3 % (ref 36.0–46.0)
Hemoglobin: 12.1 g/dL (ref 12.0–15.0)
MCHC: 32.5 g/dL (ref 30.0–36.0)
MCV: 86.4 fl (ref 78.0–100.0)
Platelets: 262 10*3/uL (ref 150.0–400.0)
RBC: 4.32 Mil/uL (ref 3.87–5.11)
RDW: 15 % (ref 11.5–15.5)
WBC: 6 10*3/uL (ref 4.0–10.5)

## 2022-07-28 NOTE — Assessment & Plan Note (Addendum)
Overall I feel that her risk of serious negative outcomes is in the intermediate range due to her moderate functional capacity and known interstitial lung disease.  With that said, due to her risk of recurrent infections the surgery appears to be warranted to treat current infections and to provide prevention of repeat infections of the mouth.  Patient would like to undergo surgery.   Will check CMP and CBC.  Will forward these to surgical office that is planning to complete surgery per their request.  In regards to pain management postsurgery I think she would be a good candidate for Tylenol, NSAIDs, antibiotics, opioids (short-term), and corticosteroids if needed.  I would recommend against mixing corticosteroids and NSAIDs.  I will reach out to my supervising physician regarding at what point she can consider restarting aspirin therapy.  Patient will be advised on this once I hear back from my supervising physician.  Patient educated not to restart the aspirin until she hears from me. Patient reports her understanding.

## 2022-07-28 NOTE — Patient Instructions (Addendum)
Avoid taking prednisone with NSAIDS (aspirin, ibuprofen, aleve, advil, goody powder). You can take tylenol safely with prednisone.   Ask Dr. Kathlene November about the hydroxychloroquine and whether or not this should be held before surgery.   Do not restart aspirin until notified by myself

## 2022-07-28 NOTE — Progress Notes (Addendum)
Established Patient Office Visit  Subjective   Patient ID: Chelsea Callahan, female    DOB: 30-Aug-1950  Age: 72 y.o. MRN: [id number]  Chief Complaint  Patient presents with   Medical Management of Chronic Issues    6 month follow up with surgical clearance     Patient arrives for surgical clearance.  She tells me that she is planning on undergoing maxillofacial and dental surgery in which multiple teeth implants in her jaw will be removed, then 2 implants will be replaced to help maintain appropriate space for future set of partial dentures to be fitted.  She currently has infection in the implants that are there presently. She reports that the implants need to be removed in order to treat the infection appropriately.  This will be an in-office procedure but she will be treated with anesthesia.  The procedure itself sounds to be low risk for serious complication.  As far as her risk factors are concerned, she has no history of MI, CAD, does have evidence of grade 2 diastolic dysfunction on echocardiogram, but no clinical signs of heart failure, has never undergone CABG, or cardiac catheterization which resulted in stent placement.  She has undergone cardiac catheterization to measure pressures related to her pulmonary hypertension.  She is greater than 42 years of age, she is female, she does not have hypertension, diabetes, she is a non-smoker.  No history of CVA, PAD, CKD, OSA.  Does have history of interstitial lung disease which is felt to be of autoimmune origin.  Is on hydroxychloroquine and CellCept.  Was told to stop CellCept prior to her procedure.  Today, denies chest pain, dyspnea.  DASI score of 47.7 with a METS score 7.9.  She normally takes a 81 mg aspirin, but stopped this about 1 month ago when she was started on sildenafil for treatment of pulmonary hypertension.  She felt that the aspirin and sildenafil might be duplicate treatment.  She is not on a beta-blocker, ACE/ARB, nitrates, other  antihypertensive, statin, or anticoagulant.  It sounds that this surgery would be beneficial to her as she is on immunosuppressive medications as part of the treatment of her interstitial lung disease of unknown autoimmune etiology.  Due to this she is at risk for recurrent infections.    Review of Systems  Constitutional:  Negative for fever and malaise/fatigue.  Respiratory:  Negative for cough and shortness of breath.   Cardiovascular:  Negative for chest pain and palpitations.      Objective:     BP 114/70   Pulse 64   Temp 97.8 F (36.6 C) (Temporal)   Ht 5\' 2"  (1.575 m)   Wt 179 lb 8 oz (81.4 kg)   SpO2 98%   BMI 32.83 kg/m  BP Readings from Last 3 Encounters:  07/28/22 114/70  07/07/22 138/76  05/19/22 122/68   Wt Readings from Last 3 Encounters:  07/28/22 179 lb 8 oz (81.4 kg)  07/07/22 178 lb 3.2 oz (80.8 kg)  05/19/22 179 lb 8 oz (81.4 kg)      Physical Exam Vitals reviewed.  Constitutional:      General: She is not in acute distress.    Appearance: Normal appearance.  HENT:     Head: Normocephalic and atraumatic.  Neck:     Vascular: No carotid bruit.  Cardiovascular:     Rate and Rhythm: Normal rate and regular rhythm.     Pulses: Normal pulses.     Heart sounds: Normal heart sounds.  Pulmonary:     Effort: Pulmonary effort is normal.     Breath sounds: Normal breath sounds.  Skin:    General: Skin is warm and dry.  Neurological:     General: No focal deficit present.     Mental Status: She is alert and oriented to person, place, and time.  Psychiatric:        Mood and Affect: Mood normal.        Behavior: Behavior normal.        Judgment: Judgment normal.    EKG normal sinus rhythm, no ST segment elevation noted.  Results for orders placed or performed in visit on 07/28/22  Comprehensive metabolic panel  Result Value Ref Range   Sodium 138 135 - 145 mEq/L   Potassium 4.3 3.5 - 5.1 mEq/L   Chloride 104 96 - 112 mEq/L   CO2 29 19 - 32  mEq/L   Glucose, Bld 95 70 - 99 mg/dL   BUN 13 6 - 23 mg/dL   Creatinine, Ser 0.74 0.40 - 1.20 mg/dL   Total Bilirubin 0.4 0.2 - 1.2 mg/dL   Alkaline Phosphatase 67 39 - 117 U/L   AST 35 0 - 37 U/L   ALT 21 0 - 35 U/L   Total Protein 7.3 6.0 - 8.3 g/dL   Albumin 4.5 3.5 - 5.2 g/dL   GFR 81.57 >60.00 mL/min   Calcium 10.6 (H) 8.4 - 10.5 mg/dL  CBC  Result Value Ref Range   WBC 6.0 4.0 - 10.5 K/uL   RBC 4.32 3.87 - 5.11 Mil/uL   Platelets 262.0 150.0 - 400.0 K/uL   Hemoglobin 12.1 12.0 - 15.0 g/dL   HCT 37.3 36.0 - 46.0 %   MCV 86.4 78.0 - 100.0 fl   MCHC 32.5 30.0 - 36.0 g/dL   RDW 15.0 11.5 - 15.5 %      The 10-year ASCVD risk score (Arnett DK, et al., 2019) is: 12%    Assessment & Plan:   Problem List Items Addressed This Visit       Other   Preoperative clearance - Primary    Overall I feel that her risk of serious negative outcomes is in the intermediate range due to her moderate functional capacity and known interstitial lung disease.  With that said, due to her risk of recurrent infections the surgery appears to be warranted to treat current infections and to provide prevention of repeat infections of the mouth.  Patient would like to undergo surgery.   Will check CMP and CBC.  Will forward these to surgical office that is planning to complete surgery per their request.  In regards to pain management postsurgery I think she would be a good candidate for Tylenol, NSAIDs, antibiotics, opioids (short-term), and corticosteroids if needed.  I would recommend against mixing corticosteroids and NSAIDs.  I will reach out to my supervising physician regarding at what point she can consider restarting aspirin therapy.  Patient will be advised on this once I hear back from my supervising physician.  Patient educated not to restart the aspirin until she hears from me. Patient reports her understanding.      Relevant Orders   EKG 12-Lead   CBC (Completed)   Comprehensive metabolic  panel (Completed)    Return in about 3 months (around 10/28/2022) for F/U with Joniece Smotherman 3-6 months.  In addition to time spent completing EKG I spent an additional 35 minutes on the visit today which included face-to-face evaluation, discussion, review  of previous records, development/discussion of treatment plan.   Ailene Ards, NP

## 2022-08-01 ENCOUNTER — Telehealth: Payer: Self-pay | Admitting: Nurse Practitioner

## 2022-08-01 ENCOUNTER — Other Ambulatory Visit (INDEPENDENT_AMBULATORY_CARE_PROVIDER_SITE_OTHER): Payer: Medicare Other

## 2022-08-01 ENCOUNTER — Encounter: Payer: Self-pay | Admitting: Nurse Practitioner

## 2022-08-01 DIAGNOSIS — Z136 Encounter for screening for cardiovascular disorders: Secondary | ICD-10-CM

## 2022-08-01 LAB — LIPID PANEL
Cholesterol: 222 mg/dL — ABNORMAL HIGH (ref 0–200)
HDL: 87.2 mg/dL (ref >39.00)
LDL Cholesterol: 122 mg/dL — ABNORMAL HIGH (ref 0–99)
NonHDL: 134.34
Total CHOL/HDL Ratio: 3
Triglycerides: 61 mg/dL (ref 0.0–149.0)
VLDL: 12.2 mg/dL (ref 0.0–40.0)

## 2022-08-01 LAB — VITAMIN D 25 HYDROXY (VIT D DEFICIENCY, FRACTURES): VITD: 47.08 ng/mL (ref 30.00–100.00)

## 2022-08-01 NOTE — Telephone Encounter (Signed)
Please call patient and let her know that I was reviewing her labs and saw that she has not had a lipid (cholesterol) panel checked. This should be done at some point to see what her cholesterol levels are, if they are high this may increase overall risk of her upcoming surgery. Thus, I would recommend it be checked before her surgery if possible. I will place the lab orders in the event she is able to get her blood drawn before her surgery. In addition, I will order the labs to recheck her calcium levels as this was slightly high on her last blood work. Please ask her to come fasting (meaning no food at least 8 hours prior to the lab draw, she can drink water).

## 2022-08-01 NOTE — Addendum Note (Signed)
Addended by: Ailene Ards on: 08/01/2022 06:40 AM   Modules accepted: Orders

## 2022-08-02 LAB — PTH, INTACT AND CALCIUM
Calcium: 10.4 mg/dL (ref 8.6–10.4)
PTH: 57 pg/mL (ref 16–77)

## 2022-08-02 NOTE — Telephone Encounter (Signed)
Notified pt w/MD Judson Roch response. Pt states she came yesterday to have labs done.Chelsea KitchenJohny Chess

## 2022-08-04 ENCOUNTER — Ambulatory Visit: Payer: Medicare Other | Admitting: Nurse Practitioner

## 2022-08-04 NOTE — Telephone Encounter (Signed)
Pt surgical clearance forms are done and faxed. Fax result ok

## 2022-08-08 ENCOUNTER — Encounter: Payer: Self-pay | Admitting: Internal Medicine

## 2022-08-08 NOTE — Telephone Encounter (Signed)
Dr. Marchelle Gearing please advise on the following My Chart message:   Chelsea Callahan [facility] (supporting Chelsea Shan, MD)2 hours ago (12:20 PM)    My oral surgery has been postponed. The Dr. wants to explore a different option that may be less invasive. He said he would let me know the date 10 days ahead of the surgery. QUESTION: Should I restart the Cel Cept until I'm notified of the surgery date?   Thank you

## 2022-08-08 NOTE — Telephone Encounter (Signed)
Ok to resume cellcept

## 2022-08-19 ENCOUNTER — Other Ambulatory Visit (HOSPITAL_COMMUNITY): Payer: Self-pay | Admitting: Internal Medicine

## 2022-09-08 ENCOUNTER — Ambulatory Visit: Payer: Medicare Other | Admitting: Nurse Practitioner

## 2022-09-16 ENCOUNTER — Telehealth: Payer: Self-pay | Admitting: Nurse Practitioner

## 2022-09-16 ENCOUNTER — Ambulatory Visit (INDEPENDENT_AMBULATORY_CARE_PROVIDER_SITE_OTHER): Payer: Medicare Other | Admitting: Nurse Practitioner

## 2022-09-16 VITALS — BP 116/64 | HR 78 | Temp 98.0°F | Ht 62.0 in | Wt 176.0 lb

## 2022-09-16 DIAGNOSIS — Z6832 Body mass index (BMI) 32.0-32.9, adult: Secondary | ICD-10-CM

## 2022-09-16 DIAGNOSIS — R2241 Localized swelling, mass and lump, right lower limb: Secondary | ICD-10-CM

## 2022-09-16 DIAGNOSIS — E785 Hyperlipidemia, unspecified: Secondary | ICD-10-CM | POA: Diagnosis not present

## 2022-09-16 DIAGNOSIS — I272 Pulmonary hypertension, unspecified: Secondary | ICD-10-CM | POA: Diagnosis not present

## 2022-09-16 DIAGNOSIS — E669 Obesity, unspecified: Secondary | ICD-10-CM

## 2022-09-16 MED ORDER — WEGOVY 0.5 MG/0.5ML ~~LOC~~ SOAJ: 0.5000 mg | SUBCUTANEOUS | 1 refills | Status: DC

## 2022-09-16 MED ORDER — WEGOVY 0.25 MG/0.5ML ~~LOC~~ SOAJ: 0.2500 mg | SUBCUTANEOUS | 1 refills | Status: DC

## 2022-09-16 NOTE — Assessment & Plan Note (Signed)
Chronic Patient to follow-up with cardiology as scheduled.

## 2022-09-16 NOTE — Assessment & Plan Note (Signed)
Acute Etiology unclear Ultrasound soft tissue ordered for further evaluation

## 2022-09-16 NOTE — Progress Notes (Signed)
Established Patient Office Visit  Subjective   Patient ID: Chelsea Callahan, female    DOB: 1950/05/22  Age: 72 y.o. MRN: [id number]  Chief Complaint  Patient presents with   Medical Management of Chronic Issues    3 month follow up, right leg lump for about two weeks, not painful/ painful to the touch     Obesity/Hyperlipidemia: last ldl 122, has history of morbid obesity s/p gastric bypass from approximately 20 years ago.  Has been able to mainly sustain weight loss but has noticed she has gained back approximately 25 to 30 pounds that she has been unable to successfully lose with exercise and diet alone.  Current BMI is 32.19.  She also has pulmonary hypertension and is being managed by cardiology and continues on sildenafil 20 mg tablet 3 times a day for this.  Denies personal or family history of thyroid cancer.  Denies personal history of pancreatitis or gallstones.  Leg mass: Located to right leg, patient noticed this about 2 weeks ago.  Is not sure if she may have hit her leg on something.  Mass is nontender.  Mass has not changed in size or location over the last 2 weeks.    Review of Systems  Constitutional:  Negative for malaise/fatigue.  Respiratory:  Negative for shortness of breath.   Cardiovascular:  Negative for chest pain.      Objective:     BP 116/64   Pulse 78   Temp 98 F (36.7 C) (Temporal)   Ht 5\' 2"  (1.575 m)   Wt 176 lb (79.8 kg)   SpO2 100%   BMI 32.19 kg/m    Physical Exam Vitals reviewed.  Constitutional:      General: She is not in acute distress.    Appearance: Normal appearance.  HENT:     Head: Normocephalic and atraumatic.  Neck:     Vascular: No carotid bruit.  Cardiovascular:     Rate and Rhythm: Normal rate and regular rhythm.     Pulses: Normal pulses.     Heart sounds: Murmur heard.  Pulmonary:     Effort: Pulmonary effort is normal.     Breath sounds: Normal breath sounds.  Skin:    General: Skin is warm and dry.        Neurological:     General: No focal deficit present.     Mental Status: She is alert and oriented to person, place, and time.  Psychiatric:        Mood and Affect: Mood normal.        Behavior: Behavior normal.        Judgment: Judgment normal.      No results found for any visits on 09/16/22.    The 10-year ASCVD risk score (Arnett DK, et al., 2019) is: 12.4%    Assessment & Plan:   Problem List Items Addressed This Visit       Cardiovascular and Mediastinum   Pulmonary hypertension (HCC)    Chronic Patient to follow-up with cardiology as scheduled.      Relevant Medications   Semaglutide-Weight Management (WEGOVY) 0.25 MG/0.5ML SOAJ   Semaglutide-Weight Management (WEGOVY) 0.5 MG/0.5ML SOAJ     Other   Lower leg mass, right - Primary    Acute Etiology unclear Ultrasound soft tissue ordered for further evaluation      Relevant Orders   [place] RT LOWER EXTREM LTD SOFT TISSUE NON VASCULAR   Class 1 obesity with serious comorbidity and body mass  index (BMI) of 32.0 to 32.9 in adult    Chronic Current BMI 32.19.   Lost over 200 pounds after undergoing gastric bypass, but regained about 30.  Has been unable to lose the 30 pounds that she regained with diet and exercise alone. Per shared decision making would like to try [facility] for weight loss. We did discuss blackbox warning as well as possible side effects and what to do if these were to occur. She will start Wegovy 0.25 mg weekly injection x 4 weeks then plan to increase to 0.5 mg weekly injection with ultimate plan to titrate up to 2.4 mg weekly. Patient educated on the importance of cleaning injection site well before injecting and rotating injection sites.  She was educated on appropriate injection sites.       Relevant Medications   Semaglutide-Weight Management (WEGOVY) 0.25 MG/0.5ML SOAJ   Semaglutide-Weight Management (WEGOVY) 0.5 MG/0.5ML SOAJ   Hyperlipidemia    Chronic Last LDL 122 Not on  cholesterol-lowering medication Current ASCVD risk score 12.4% Patient chooses to defer starting cholesterol-lowering medication, she was educated that current guidelines recommend that she start on some based on her current ASCVD risk or.  She reports understanding would like to hold off for now. Continue check periodic fasting lipid panel, and reassess treatment plan accordingly.      Relevant Medications   Semaglutide-Weight Management (WEGOVY) 0.25 MG/0.5ML SOAJ   Semaglutide-Weight Management (WEGOVY) 0.5 MG/0.5ML SOAJ    Return in about 6 weeks (around 10/28/2022) for F/U with Inette Doubrava in-person or virtual.    Elenore Paddy, NP

## 2022-09-16 NOTE — Telephone Encounter (Signed)
Can we start PA for wegovy for this patient. She has obesity with associated HLD and has pulmonary hypertension

## 2022-09-16 NOTE — Assessment & Plan Note (Addendum)
Chronic Current BMI 32.19.   Lost over 200 pounds after undergoing gastric bypass, but regained about 30.  Has been unable to lose the 30 pounds that she regained with diet and exercise alone. Per shared decision making would like to try [facility] for weight loss. We did discuss blackbox warning as well as possible side effects and what to do if these were to occur. She will start Wegovy 0.25 mg weekly injection x 4 weeks then plan to increase to 0.5 mg weekly injection with ultimate plan to titrate up to 2.4 mg weekly. Patient educated on the importance of cleaning injection site well before injecting and rotating injection sites.  She was educated on appropriate injection sites.

## 2022-09-16 NOTE — Assessment & Plan Note (Signed)
Chronic Last LDL 122 Not on cholesterol-lowering medication Current ASCVD risk score 12.4% Patient chooses to defer starting cholesterol-lowering medication, she was educated that current guidelines recommend that she start on some based on her current ASCVD risk or.  She reports understanding would like to hold off for now. Continue check periodic fasting lipid panel, and reassess treatment plan accordingly.

## 2022-09-20 ENCOUNTER — Other Ambulatory Visit: Payer: Self-pay | Admitting: Nurse Practitioner

## 2022-09-20 DIAGNOSIS — R053 Chronic cough: Secondary | ICD-10-CM

## 2022-09-20 NOTE — Telephone Encounter (Signed)
Submitted w/ (Key: [id number]) PA sent to been sent to [facility]...Raechel Chute

## 2022-09-30 NOTE — Telephone Encounter (Signed)
Per [facility] PA returned with member not found. Name on PA is Chelsea Callahan, which does not match registration information. Name on PA updated to Chelsea Callahan and submitted to [place]. Pending review.

## 2022-09-30 NOTE — Telephone Encounter (Signed)
PA returned again as Member Not Found. ID# in PA was incorrect. Updated this and resubmitted. Again, returned as Member Not Found.  Contacted [employer] [phone] PA Dept, spoke with Stanton Kidney They show PA on file from 09/27/22 and it has been DENIED.  PA on CMM has been archived as DENIED.

## 2022-09-30 NOTE — Telephone Encounter (Signed)
No alternatives was given. Pt will have to call insurance to see what her insurance will cover for weight loss.Marland KitchenRaechel Callahan

## 2022-10-03 NOTE — Telephone Encounter (Signed)
My chart message send to pt

## 2022-10-13 ENCOUNTER — Ambulatory Visit: Payer: Medicare Other | Admitting: Nurse Practitioner

## 2022-10-18 ENCOUNTER — Telehealth (HOSPITAL_COMMUNITY): Payer: Self-pay | Admitting: Vascular Surgery

## 2022-10-18 NOTE — Telephone Encounter (Signed)
Lvm tpo move 6/21 appt to tues afternoon or WED AM

## 2022-10-19 ENCOUNTER — Ambulatory Visit (INDEPENDENT_AMBULATORY_CARE_PROVIDER_SITE_OTHER): Payer: Medicare Other | Admitting: Internal Medicine

## 2022-10-19 ENCOUNTER — Ambulatory Visit
Admission: RE | Admit: 2022-10-19 | Discharge: 2022-10-19 | Disposition: A | Discharge status: Home / Self Care | Payer: Medicare Other | Source: Ambulatory Visit | Attending: Nurse Practitioner | Admitting: Nurse Practitioner

## 2022-10-19 ENCOUNTER — Telehealth: Payer: Self-pay | Admitting: Internal Medicine

## 2022-10-19 DIAGNOSIS — M359 Systemic involvement of connective tissue, unspecified: Secondary | ICD-10-CM

## 2022-10-19 DIAGNOSIS — R2241 Localized swelling, mass and lump, right lower limb: Secondary | ICD-10-CM

## 2022-10-19 DIAGNOSIS — J8489 Other specified interstitial pulmonary diseases: Secondary | ICD-10-CM

## 2022-10-19 LAB — PULMONARY FUNCTION TEST
DL/VA % pred: 106 %
DL/VA: 4.48 ml/min/mmHg/L
DLCO cor % pred: 87 %
DLCO cor: 15.75 ml/min/mmHg
DLCO unc % pred: 87 %
DLCO unc: 15.75 ml/min/mmHg
FEF 25-75 Pre: 2.08 L/sec
FEF2575-%Pred-Pre: 119 %
FEV1-%Pred-Pre: 80 %
FEV1-Pre: 1.64 L
FEV1FVC-%Pred-Pre: 113 %
FEV6-%Pred-Pre: 72 %
FEV6-Pre: 1.89 L
FEV6FVC-%Pred-Pre: 104 %
FVC-%Pred-Pre: 70 %
FVC-Pre: 1.92 L
Pre FEV1/FVC ratio: 86 %
Pre FEV6/FVC Ratio: 100 %

## 2022-10-19 NOTE — Patient Instructions (Signed)
Spiro/DLCO performed today.  

## 2022-10-19 NOTE — Telephone Encounter (Signed)
PT was told to make a PFT BA appt back to back with a FU appt. Only the PFT was sched. The next appt we had was not until August w/Dr. R (30 min needed). PT says her cond needs to be closely monitored. I will put her on a wait list but please call if Dr. Dewayne Hatch to open a spot for her. TY.

## 2022-10-19 NOTE — Progress Notes (Signed)
Spiro/DLCO performed today.  

## 2022-10-21 ENCOUNTER — Encounter (HOSPITAL_COMMUNITY): Payer: Medicare Other | Admitting: Internal Medicine

## 2022-10-21 ENCOUNTER — Ambulatory Visit
Admission: RE | Admit: 2022-10-21 | Discharge: 2022-10-21 | Disposition: A | Discharge status: Home / Self Care | Payer: Medicare Other | Source: Ambulatory Visit | Attending: Nurse Practitioner | Admitting: Nurse Practitioner

## 2022-10-21 ENCOUNTER — Encounter: Payer: Self-pay | Admitting: Nurse Practitioner

## 2022-10-21 DIAGNOSIS — M810 Age-related osteoporosis without current pathological fracture: Secondary | ICD-10-CM | POA: Diagnosis not present

## 2022-10-21 DIAGNOSIS — E2839 Other primary ovarian failure: Secondary | ICD-10-CM

## 2022-10-21 DIAGNOSIS — E349 Endocrine disorder, unspecified: Secondary | ICD-10-CM | POA: Diagnosis not present

## 2022-10-21 NOTE — Telephone Encounter (Signed)
Dr. Marchelle Gearing is patients f/u in August fine? Or does she need to be seen sooner? Can we use a held spot

## 2022-10-25 ENCOUNTER — Ambulatory Visit (HOSPITAL_COMMUNITY)
Admission: RE | Admit: 2022-10-25 | Discharge: 2022-10-25 | Disposition: A | Discharge status: Home / Self Care | Payer: Medicare Other | Source: Ambulatory Visit | Attending: Internal Medicine | Admitting: Internal Medicine

## 2022-10-25 ENCOUNTER — Encounter (HOSPITAL_COMMUNITY): Payer: Self-pay | Admitting: Internal Medicine

## 2022-10-25 VITALS — BP 124/70 | HR 75 | Wt 170.0 lb

## 2022-10-25 DIAGNOSIS — I272 Pulmonary hypertension, unspecified: Secondary | ICD-10-CM | POA: Diagnosis not present

## 2022-10-25 DIAGNOSIS — Z6831 Body mass index (BMI) 31.0-31.9, adult: Secondary | ICD-10-CM | POA: Insufficient documentation

## 2022-10-25 DIAGNOSIS — Z79624 Long term (current) use of inhibitors of nucleotide synthesis: Secondary | ICD-10-CM | POA: Diagnosis not present

## 2022-10-25 DIAGNOSIS — M359 Systemic involvement of connective tissue, unspecified: Secondary | ICD-10-CM | POA: Diagnosis not present

## 2022-10-25 DIAGNOSIS — J849 Interstitial pulmonary disease, unspecified: Secondary | ICD-10-CM | POA: Diagnosis not present

## 2022-10-25 NOTE — Patient Instructions (Signed)
There has been no changes to your medications.  Your physician has requested that you have an echocardiogram. Echocardiography is a painless test that uses sound waves to create images of your heart. It provides your doctor with information about the size and shape of your heart and how well your heart's chambers and valves are working. This procedure takes approximately one hour. There are no restrictions for this procedure. Please do NOT wear cologne, perfume, aftershave, or lotions (deodorant is allowed). Please arrive 15 minutes prior to your appointment time.  Your physician recommends that you schedule a follow-up appointment in: 6 months ( December) ** please call the office in October to arrange your follow up appointment. **  If you have any questions or concerns before your next appointment please send [place] a message through [place] or call our office at [phone].    TO LEAVE A MESSAGE FOR THE NURSE SELECT OPTION 2, PLEASE LEAVE A MESSAGE INCLUDING: YOUR NAME DATE OF BIRTH CALL BACK NUMBER REASON FOR CALL**this is important as we prioritize the call backs  YOU WILL RECEIVE A CALL BACK THE SAME DAY AS LONG AS YOU CALL BEFORE 4:00 PM  At the [facility], you and your health needs are our priority. As part of our continuing mission to provide you with exceptional heart care, we have created designated Provider Care Teams. These Care Teams include your primary Cardiologist (physician) and Advanced Practice Providers (APPs- Physician Assistants and Nurse Practitioners) who all work together to provide you with the care you need, when you need it.   You may see any of the following providers on your designated Care Team at your next follow up: Dr Arvilla Meres Dr Marca Ancona Dr. Marcos Eke, NP Robbie Lis, [place] [facility] [place], [place] Brynda Peon, NP Karle Plumber, PharmD   Please be sure to bring in all your medications  bottles to every appointment.    Thank you for choosing [facility]

## 2022-10-25 NOTE — Progress Notes (Signed)
ADVANCED HF CLINIC NOTE  Referring Physician: Dr. Pollyann Savoy- Primary Care: Elenore Paddy, NP Primary HF Cardiologist: New Rheumatologist: Dr Aryl   HPI: Chelsea Callahan is a 72 y.o. obese female with history of autoimmune disease (positive ANA 1:1,280), ILD and osteoarthritis. She has intermittent symptoms of raynaud's but denies any digital ulcerations.   Had hi-res CT chest 3/23 suggestive of ILD with enlarged pulmonary arteries. Esophagus normal   Was a "light" smoker (6-7 cigs/day) for 15 years quit 1999. No h/o DVT or PE. No diet drugs .  Home sleep study- negative.   Remains on cellcept  RHC 6/23  RA = 8 RV = 45/11 PA = 45/13 (28) PCW = 17 Fick cardiac output/index = 5.2/2.9 PVR = 2.1 FA sat = 97% PA sat = 69%, 70% SVC sat = 66%  Today she returns for HF follow up. Feels good. Walks 1-2 miles every day.SOB when she starts then warms up. Also doing exercise classes. No edema, orthopnea or PND. No dizziness. Ran out of sildenafil 1 month ago. Cannot tell a difference.     Past Medical History:  Diagnosis Date   Anemia    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HCC)    Blood transfusion without reported diagnosis    Cataract    bil/ per pt   GERD (gastroesophageal reflux disease)    Primary osteoarthritis of right knee    Raynaud disease    Raynaud disease    Spinal headache    c-section   Vasculitis (HCC)    on prednisone -     Current Outpatient Medications  Medication Sig Dispense Refill   Cholecalciferol ([employer] D3) 25 MCG (1000 UT) capsule Take 1,000 Units by mouth daily.     Fe Bisgly-Succ-C-Thre-B12-FA (IRON-150 PO) Take 1 tablet by mouth daily.     hydroxychloroquine (PLAQUENIL) 200 MG tablet TAKE 1 TABLET BY MOUTH EVERY DAY 90 tablet 0   Multiple Vitamin (MULTIVITAMIN WITH MINERALS) TABS tablet Take 1 tablet by mouth daily.     mycophenolate (CELLCEPT) 500 MG tablet Take 500 mg by mouth 2 (two) times daily.     OVER THE COUNTER MEDICATION Take 1 capsule by  mouth daily. Sea Moss     psyllium (METAMUCIL) 58.6 % packet Take 1 packet by mouth daily as needed (constipation).     aspirin EC 81 MG tablet Take 81 mg by mouth daily. Swallow whole. (Patient not taking: Reported on 10/25/2022)     sildenafil (REVATIO) 20 MG tablet Take 1 tablet (20 mg total) by mouth 3 (three) times daily. Please call to schedule follow up [phone] (Patient not taking: Reported on 10/25/2022) 90 tablet 0   No current facility-administered medications for this encounter.    Allergies  Allergen Reactions   Ginger Swelling and Other (See Comments)    Stomach swells   Pork-Derived Products Other (See Comments)    UNSPECIFIED "PERSONAL REASONS"   Shellfish Allergy Other (See Comments)    UNSPECIFIED "Personal reasons/dietary"      Social History   Socioeconomic History   Marital status: Married    Spouse name: Not on file   Number of children: Not on file   Years of education: Not on file   Highest education level: Not on file  Occupational History   Not on file  Tobacco Use   Smoking status: Former    Packs/day: 0.25    Years: 15.00    Additional pack years: 0.00    Total pack years: 3.75  Types: Cigarettes    Quit date: 63    Years since quitting: 25.4    Passive exposure: Past   Smokeless tobacco: Never  Vaping Use   Vaping Use: Never used  Substance and Sexual Activity   Alcohol use: Yes    Alcohol/week: 4.0 - 6.0 standard drinks of alcohol    Types: 4 - 6 Glasses of wine per week    Comment: weekly    Drug use: No   Sexual activity: Not on file  Other Topics Concern   Not on file  Social History Narrative   Not on file   Social Determinants of Health   Financial Resource Strain: Not on file  Food Insecurity: Not on file  Transportation Needs: Not on file  Physical Activity: Not on file  Stress: Not on file  Social Connections: Not on file  Intimate Partner Violence: Not on file      Family History  Problem Relation Age of  Onset   Cancer Brother    Multiple myeloma Brother    Colon cancer Neg Hx    Esophageal cancer Neg Hx    Rectal cancer Neg Hx    Stomach cancer Neg Hx     Vitals:   10/25/22 1509  BP: 124/70  Pulse: 75  SpO2: 100%  Weight: 77.1 kg (170 lb)    Wt Readings from Last 3 Encounters:  10/25/22 77.1 kg (170 lb)  09/16/22 79.8 kg (176 lb)  07/28/22 81.4 kg (179 lb 8 oz)     PHYSICAL EXAM: General:  Well appearing. No resp difficulty HEENT: normal Neck: supple. no JVD. Carotids 2+ bilat; no bruits. No lymphadenopathy or thryomegaly appreciated. Cor: PMI nondisplaced. Regular rate & rhythm. No rubs, gallops or murmurs. Lungs: clear Abdomen: soft, nontender, nondistended. No hepatosplenomegaly. No bruits or masses. Good bowel sounds. Extremities: no cyanosis, clubbing, rash, edema Neuro: alert & orientedx3, cranial nerves grossly intact. moves all 4 extremities w/o difficulty. Affect pleasant  ASSESSMENT & PLAN:   1. 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 RV normal. RVSP 40.  - RHC 6/23 RA 8 PA 45/13 (28) PCW  17 Fick 5.2/2.9 PVR  2.1 - Sleep study negative for OSA - PFTs 6/24 FEV1 1.84 (80%) FVC 1.92 (70%) DLCO 70% - Has been off sildenafil x 1 month without any change in symptoms - NYHA I-II - Will repeat echo. If stable will continue to follow. If RVSP going up or evidence of RV strain will need repeat RHC  2. ILD - on cellcept.  - hi-res CT 3/24 c/w ILD - has f/u with Dr. Marchelle Gearing  3. Morbid obesity - Body mass index is 31.09 kg/m. - On [id number]  Arvilla Meres, MD  3:41 PM

## 2022-10-26 NOTE — Telephone Encounter (Signed)
  Possible changes in June 2024 PFT but could be variability as well. Get anoher spiro/dlco in mid- August 2024 and see me; If she is more symptomatic (dyspnea, cough) compared to last visit I might have to se her sooner         Latest Ref Rng & Units 10/19/2022    2:52 PM 02/28/2022    2:21 PM 10/08/2021   10:54 AM  PFT Results  FVC-Pre L 1.92  P 2.14  2.14   FVC-Predicted Pre % 70  P 84  110   FVC-Post L   2.09   FVC-Predicted Post %   107   Pre FEV1/FVC % % 86  P 90  90   Post FEV1/FCV % %   90   FEV1-Pre L 1.64  P 1.93  1.93   FEV1-Predicted Pre % 80  P 101  129   FEV1-Post L   1.87   DLCO uncorrected ml/min/mmHg 15.75  P 17.35  17.27   DLCO UNC% % 87  P 102  102   DLCO corrected ml/min/mmHg 15.75  P 18.39  17.27   DLCO COR %Predicted % 87  P 108  102   DLVA Predicted % 106  P 112  104   TLC L   3.64   TLC % Predicted %   81   RV % Predicted %   63     P Preliminary result

## 2022-10-26 NOTE — Telephone Encounter (Signed)
ATC patient. LM to call back to get PFT and follow up with Dr. Marchelle Gearing scheduled.

## 2022-10-28 ENCOUNTER — Telehealth (INDEPENDENT_AMBULATORY_CARE_PROVIDER_SITE_OTHER): Payer: Medicare Other | Admitting: Nurse Practitioner

## 2022-10-28 VITALS — Ht 62.0 in | Wt 168.0 lb

## 2022-10-28 DIAGNOSIS — E669 Obesity, unspecified: Secondary | ICD-10-CM

## 2022-10-28 DIAGNOSIS — Z6832 Body mass index (BMI) 32.0-32.9, adult: Secondary | ICD-10-CM

## 2022-10-28 DIAGNOSIS — I7300 Raynaud's syndrome without gangrene: Secondary | ICD-10-CM | POA: Diagnosis not present

## 2022-10-28 DIAGNOSIS — R768 Other specified abnormal immunological findings in serum: Secondary | ICD-10-CM | POA: Diagnosis not present

## 2022-10-28 DIAGNOSIS — M359 Systemic involvement of connective tissue, unspecified: Secondary | ICD-10-CM | POA: Diagnosis not present

## 2022-10-28 DIAGNOSIS — J849 Interstitial pulmonary disease, unspecified: Secondary | ICD-10-CM | POA: Diagnosis not present

## 2022-10-28 DIAGNOSIS — M1990 Unspecified osteoarthritis, unspecified site: Secondary | ICD-10-CM | POA: Diagnosis not present

## 2022-10-28 DIAGNOSIS — Z79899 Other long term (current) drug therapy: Secondary | ICD-10-CM | POA: Diagnosis not present

## 2022-10-28 NOTE — Assessment & Plan Note (Signed)
Chronic Starting BMI 32.19 in May 2024 Current BMI 30.73 Patient congratulated regarding weight loss, encouraged to continue focusing on lifestyle modification

## 2022-10-28 NOTE — Telephone Encounter (Signed)
Please try again so I can sign encounter.TY.

## 2022-10-28 NOTE — Progress Notes (Signed)
   Established Patient Office Visit  An audio/visual tele-health visit was completed today for this patient. I connected with  Chelsea Callahan on 10/28/22 utilizing audio/visual technology and verified that I am speaking with the correct person using two identifiers. The patient was located at their home, and I was located at the office of [facility] during the encounter. I discussed the limitations of evaluation and management by telemedicine. The patient expressed understanding and agreed to proceed.     Subjective   Patient ID: Chelsea Callahan, female    DOB: 10-Feb-1951  Age: 72 y.o. MRN: [id number]  Chief Complaint  Patient presents with   Obesity   Called patient to discuss how she is tolerating the [facility] for weight loss. Due to insurance denial patient never started it. She has been losing weight with diet and would like to remain off of medication for weight loss at this time. She is continuing to follow with cardiology and rheumatology and is doing well. She has no other acute concerns today.      ROS: see hpi    Objective:     Ht 5\' 2"  (1.575 m)   Wt 168 lb (76.2 kg)   BMI 30.73 kg/m  BP Readings from Last 3 Encounters:  10/25/22 124/70  09/16/22 116/64  07/28/22 114/70   Wt Readings from Last 3 Encounters:  10/28/22 168 lb (76.2 kg)  10/25/22 170 lb (77.1 kg)  09/16/22 176 lb (79.8 kg)      Physical Exam Comprehensive physical exam not completed today as office visit was conducted remotely.  Patient appears well on video.  Patient was alert and oriented, and appeared to have appropriate judgment.     No results found for any visits on 10/28/22.    The 10-year ASCVD risk score (Arnett DK, et al., 2019) is: 13.9%    Assessment & Plan:   Problem List Items Addressed This Visit       Other   Class 1 obesity with serious comorbidity and body mass index (BMI) of 32.0 to 32.9 in adult - Primary    Return in about 6  months (around 04/29/2023) for F/U with Chelsea Callahan.    Elenore Paddy, NP

## 2022-10-28 NOTE — Telephone Encounter (Signed)
Patient had returned call and scheduled follow up with Dr. Marchelle Gearing for 12/23/22.  Spiro/dlco was not scheduled as requested by Dr. Marchelle Gearing. ATC patient to schedule. LM to call office.  I did place a recall for 30 min PFT as a back up in case patient does not call to schedule.

## 2022-11-01 ENCOUNTER — Ambulatory Visit (HOSPITAL_COMMUNITY)
Admission: RE | Admit: 2022-11-01 | Discharge: 2022-11-01 | Disposition: A | Discharge status: Home / Self Care | Payer: Medicare Other | Source: Ambulatory Visit | Attending: Nurse Practitioner | Admitting: Nurse Practitioner

## 2022-11-01 DIAGNOSIS — I272 Pulmonary hypertension, unspecified: Secondary | ICD-10-CM | POA: Diagnosis not present

## 2022-11-01 DIAGNOSIS — I509 Heart failure, unspecified: Secondary | ICD-10-CM | POA: Insufficient documentation

## 2022-11-01 DIAGNOSIS — I3139 Other pericardial effusion (noninflammatory): Secondary | ICD-10-CM | POA: Insufficient documentation

## 2022-11-01 LAB — ECHOCARDIOGRAM COMPLETE
Area-P 1/2: 2.6 cm2
Calc EF: 58.5 %
S' Lateral: 2.9 cm
Single Plane A2C EF: 57.8 %
Single Plane A4C EF: 59.7 %

## 2022-11-01 NOTE — Progress Notes (Signed)
Echocardiogram 2D Echocardiogram has been performed.  Chelsea Callahan 11/01/2022, 1:46 PM

## 2022-11-14 ENCOUNTER — Other Ambulatory Visit: Payer: Medicare Other

## 2022-12-01 HISTORY — PX: OTHER SURGICAL HISTORY: SHX169

## 2022-12-22 ENCOUNTER — Encounter: Payer: Self-pay | Admitting: Nurse Practitioner

## 2022-12-23 ENCOUNTER — Ambulatory Visit: Payer: Medicare Other | Admitting: Internal Medicine

## 2022-12-23 ENCOUNTER — Telehealth: Payer: Self-pay | Admitting: Internal Medicine

## 2022-12-23 ENCOUNTER — Telehealth: Payer: Medicare Other | Admitting: Internal Medicine

## 2022-12-23 ENCOUNTER — Encounter: Payer: Self-pay | Admitting: Internal Medicine

## 2022-12-23 DIAGNOSIS — D849 Immunodeficiency, unspecified: Secondary | ICD-10-CM | POA: Diagnosis not present

## 2022-12-23 DIAGNOSIS — J8489 Other specified interstitial pulmonary diseases: Secondary | ICD-10-CM

## 2022-12-23 DIAGNOSIS — Z79899 Other long term (current) drug therapy: Secondary | ICD-10-CM | POA: Diagnosis not present

## 2022-12-23 DIAGNOSIS — Q998 Other specified chromosome abnormalities: Secondary | ICD-10-CM

## 2022-12-23 DIAGNOSIS — I7300 Raynaud's syndrome without gangrene: Secondary | ICD-10-CM | POA: Diagnosis not present

## 2022-12-23 DIAGNOSIS — R768 Other specified abnormal immunological findings in serum: Secondary | ICD-10-CM

## 2022-12-23 DIAGNOSIS — I2721 Secondary pulmonary arterial hypertension: Secondary | ICD-10-CM | POA: Diagnosis not present

## 2022-12-23 NOTE — Patient Instructions (Addendum)
ICD-10-CM   1. Interstitial lung disease due to connective tissue disease (HCC)  J84.89    M35.9     2. Raynaud's disease without gangrene  I73.00     3. ANA positive  R76.8     4. 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 On Cellcept therapy  Z79.899     6. Immunosuppressed status (HCC)  D84.9     7. Vaccine counseling  Z71.85      Interstitial lung disease due to connective tissue disease (HCC) Raynaud's disease without gangrene ANA positive Myositis associated antibody positive On Cellcept therapy Immunosuppressed status (HCC)   - tolerating well cellcept 500mg  twice daily since May/June 2023 via Dr Deanne Coffer - 2nd opinion at [facility] Dr Jon Billings - Nov 2023: continue current course - Visit 12/23/2022 - ILD could be getting worse based on sympotoms and June 2024 PFT  Plan - continue cellcept via Dr Deanne Coffer   - do spirometry and dlco in NEXT <= 2 weeks - monitor symptoms  -any worsening then consider adding new medications (ofev or Rituxan or increaseing cellcept)  - keep [place] posted about any respiratory infections  Pulmonary Hypertension  - off  revatio  Plan - per  Dr Teressa Lower  5mm Lingular Nodule March 2023 -> no change MArch 2024  Plan  - expectant followup     Followup  - 2-3 weeks after spiromery and dlco with Dr Marchelle Gearing; 30 min visit  - symptoms score and walk test at followup

## 2022-12-23 NOTE — Telephone Encounter (Signed)
Patient was contacted on 6/26 and 6/28 to schedule PFT, but could not be reached. I attempted to call patient twice this morning and received a busy tone each time. I sent patient a mychart messaging offering PFT 8/27 at 11am and follow up with Dr. Marchelle Gearing at 2pm.

## 2022-12-23 NOTE — Telephone Encounter (Signed)
Triage/Lesli.Tay/Front desk   Jameice Sarsour is very upset that a pulmonary function test for this visit following the June 2024 pulmonary function test was not scheduled.  Due June 2024 pulmonary function test showed progressive ILD.  Plan - Can you please try to get a spirometry and DLCO in the next 2 weeks either here or in the hospital.  -After this she needs to see me for at least a 15 or 30-minute visit [ideally face-to-face].  -If he cannot get this done especially the PFT please let me know ASAP so I can see what other alternative is available in [place] to get this done [maybe at another location]  Please reply after the action plan is complete.    SIGNATURE    Dr. Kalman Shan, M.D., F.C.C.P,  Pulmonary and Critical Care Medicine Staff Physician, [facility] Director - Interstitial Lung Disease  Program  Pulmonary Fibrosis [facility] [place], [place], [address]   Pager: [phone], If no answer  -> Check AMION or Try [phone] Telephone (clinical office): [phone] Telephone (research): [phone]  10:47 AM 12/23/2022

## 2022-12-23 NOTE — Progress Notes (Signed)
OV 08/26/2021 - new ILD [facility]. Referred by Dr Pollyann Savoy  Subjective:  Patient ID: Chelsea Callahan, female , DOB: May 13, 1950 , age 72 y.o. , MRN: [id number] , ADDRESS: [address] [place] [address] PCP Elenore Paddy, NP Patient Care Team: Elenore Paddy, NP as PCP - General (Nurse Practitioner)  This Provider for this visit: Treatment Team:  Attending Provider: Kalman Shan, MD    08/26/2021 -   Chief Complaint  Patient presents with   Consult    Referred for possible ILD     HPI Chelsea Callahan 72 y.o. -referred by Dr Naida Sleight.  72 year old female.  She used to live in [place] and [place] and.  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  Then in 2013 she relocated to [facility] along with her husband.  This is for her retirement.  She tells me that approximately 13 years ago she developed Raynaud's symptoms.  She also started having some alopecia may be a year or 2 before that.  This then resulted in a positive ANA testing.  She then started following with a rheumatologist for 5 years before she relocated to [facility] and for the last 8 or 9 years has established care with Dr. Corliss Skains.  Her ANA here is also positive [see below].  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  She is followed periodically.  She is on Plaquenil.  She has arthritis but this is osteoarthritis.  She denies any scleroderma findings or rheumatoid arthritis findings of malar rash or oral ulcers or hematuria or other connective tissue disease findings beyond the Raynaud's.  During the course of routine follow-up this year she was discovered to have crackles.  The notes also confirmed this.  This resulted in a high-resolution CT chest that shows interstitial lung disease predominantly with a craniocaudal gradient.  Reticulation and some traction  bronchiectasis but no honeycombing.  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  Therefore she has been referred here.  She tells me that she prefers to be very aggressive with the treatment and management approach.  She has a life goal of living up to 90 years.  Currently she is able to walk 2 miles every 2 weeks.  She never stops.   She does not feel much short of breath.  Initially she has some chest tightness and this resolves.  Is not much of a cough.  Past medical history - Negative for any specific connective tissue disease.  Negative for hiatal hernia and acid reflux. - Positive for ANA + and low titer rheumatoid factor [discussion with Dr. Algis Downs today:: Undifferentiated connective tissue disease:] - She has had the COVID disease in January 2022.  She also had COVID-vaccine 4 shots.  Review of systems - Raynaud's present - Osteoarthritis present  Family history of pulmonary disease - Brother has myasthenia gravis but otherwise negative  Exposure history - Smoked from [age] although in between she stopped between 1979 and 1983.  3 to 5 cigarettes/day.  She did some marijuana as a teenager.  Otherwise no cocaine use or no intravenous drug use.  Home and Hobby details - [place]  She has lived there for 2 years.  Is a private home.  Detail organic antigen exposure history in this house is negative.  She is never used feather pillow or down jackets.  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  Detail organic and inorganic antigen exposure history at work is negative  Pulmonary toxicity history - Denies any adverse medication intake    AUOTIMMUNE   Latest Reference Range & Units 10/17/16 12:14 08/17/17 14:55 03/07/18 14:48 07/03/19 11:32 06/16/20 14:15 11/13/20 00:00 07/06/21 15:20  Anti Nuclear Antibody (ANA) NEGATIVE        POSITIVE !  ANA Pattern 1        Nuclear, Nucleolar !  ANA Titer 1 titer       1:1,280 (H)  ds DNA Ab  IU/mL 1 1 1 1 1 1 1   ENA SM Ab Ser-aCnc <1.0 NEG AI       <1.0 NEG  !: Data is abnormal (H): Data is abnormally high  Latest Reference Range & Units 07/06/21 15:20  Ribonucleic Protein(ENA) Antibody, IgG <1.0 NEG AI <1.0 NEG  ENA SM Ab Ser-aCnc <1.0 NEG AI <1.0 NEG  SSA (Ro) (ENA) Antibody, IgG <1.0 NEG AI <1.0 NEG  SSB (La) (ENA) Antibody, IgG <1.0 NEG AI <1.0 NEG  Scleroderma (Scl-70) (ENA) Antibody, IgG <1.0 NEG AI <1.0 NEG    Latest Reference Range & Units 10/17/16 12:14 08/17/17 14:55 03/07/18 14:48 07/03/19 11:32 06/16/20 14:15 11/13/20 00:00 07/06/21 15:20  Sed Rate 0 - 30 mm/h  9 1 6 6 9 11   Anticardiolipin Ab,IgA,Qn APL <11 <11       Anticardiolipin Ab,IgG,Qn GPL <14 <14       Anticardiolipin Ab,IgM,Qn MPL <12 <12        CT Chest data - HRCT March 2023  Narrative & Impression  CLINICAL DATA:  Autoimmune disease, crackles in chest.   EXAM: CT CHEST WITHOUT CONTRAST   TECHNIQUE: Multidetector CT imaging of the chest was performed following the standard protocol without intravenous contrast. High resolution imaging of the lungs, as well as inspiratory and expiratory imaging, was performed.   RADIATION DOSE REDUCTION: This exam was performed according to the departmental dose-optimization program which includes automated exposure control, adjustment of the mA and/or kV according to patient size and/or use of iterative reconstruction technique.   COMPARISON:  None.   FINDINGS: Cardiovascular: Atherosclerotic calcification of the aorta, aortic valve and coronary arteries. Enlarged right and left pulmonary arteries and heart. Small amount of pericardial fluid may be physiologic.   Mediastinum/Nodes: Mediastinal lymph nodes measure up to 10 mm in the AP window. Hilar regions are difficult to evaluate without IV contrast. Small axillary and subpectoral lymph nodes with the largest measuring 1.4 cm in the right axilla. Esophagus is grossly unremarkable.    Lungs/Pleura: Peripheral basilar subpleural reticulation, ground-glass and traction bronchiectasis/bronchiolectasis. No definitive honeycombing. Probable subpleural lymph nodes along the right major fissure, measuring up to 5 mm. Calcified granulomas. Noncalcified 5 mm lingular nodule (4 x 6 mm, 6/73). No pleural fluid. Airway is unremarkable. Mild air trapping.   Upper Abdomen: Visualized portion of the liver is unremarkable. Stone in the gallbladder. Visualized portions of the adrenal glands, kidneys, spleen and pancreas are grossly unremarkable. Gastric bypass.   Musculoskeletal: Degenerative changes in the spine. Old right rib fractures. No worrisome lytic or sclerotic lesions.   IMPRESSION: 1. Pulmonary parenchymal pattern of interstitial lung disease may be due to usual interstitial pneumonitis or nonspecific interstitial pneumonitis. Findings are categorized as probable UIP per consensus guidelines: Diagnosis of Idiopathic Pulmonary Fibrosis: An Official ATS/ERS/JRS/ALAT Clinical Practice Guideline. Am Rosezetta Schlatter Crit Care Med Vol  198, Iss 5, [id number], Dec 31 2016. 2. Small amount of pericardial fluid may be physiologic. 3. 5 mm lingular nodule. If the patient is high-risk, a non-contrast chest CT can be considered in 12 months.This recommendation follows the consensus statement: Guidelines for Management of Incidental Pulmonary Nodules Detected on CT Images: From the Fleischner Society 2017; Radiology 2017; 284:228-243. 4. Small to borderline enlarged mediastinal and axillary lymph nodes. Difficult to exclude a lymphoproliferative disorder. 5. Cholelithiasis. 6. Aortic atherosclerosis (ICD10-I70.0). Coronary artery calcification. 7. Enlarged pulmonary arteries, indicative of pulmonary arterial hypertension. 8.  Emphysema (ICD10-J43.9).     Electronically Signed   By: Leanna Battles M.D.   On: 07/19/2021 10:34         10/08/2021 Patient presents today for  follow-up.  She has a history of undifferentiated connective tissue disease.  She follows with rheumatology.  HRCT imaging in March showed pulmonary parenchymal pattern of interstitial lung disease, findings are categorized as probable UIP.  One antibody in myositis panel was positive.  Recently been placed on CellCept by Dr. Deanne Coffer with Rheumatology, awaiting results of hep panel before starting medication.  Pulmonary function testing today was normal.  Simple walk test without oxygen desaturation.  She is largely asymptomatic.  She walks on average 2 miles a day 5 times a week.  Only respiratory complaint is some dyspnea symptoms about halfway through her walk.  She has no significant shortness of breath, cough, chest tightness or wheezing.  Pulmonary function testing 10/08/2021 FVC 2.09 (107%), FEV1 1.87 (125%), ratio 90, TLC 81%, DLCOunc 102%  RHC 10/27/21  Findings:   RA = 8 RV = 45/11 PA = 45/13 (28) PCW = 17 Fick cardiac output/index = 5.2/2.9 PVR = 2.1 FA sat = 97% PA sat = 69%, 70% SVC sat = 66%   Assessment: 1. 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 Marchelle Gearing. Likely start macitentan    Arvilla Meres, MD  1:40 PM   OV 01/25/2022  Subjective:  Patient ID: Chelsea Callahan, female , DOB: 1951/02/21 , age 43 y.o. , MRN: [id number] , ADDRESS: [address] [place] [address] PCP Elenore Paddy, NP Patient Care Team: Elenore Paddy, NP as PCP - General (Nurse Practitioner)  This Provider for this visit: Treatment Team:  Attending Provider: Kalman Shan, MD    01/25/2022 -   Chief Complaint  Patient presents with   Follow-up    PFT performed 8/11 at [facility].  Pt states she has been doing okay since last visit and denies any complaints.   HPI Chelsea Callahan 72 y.o. -returns for follow-up.  Since I last personally met her she has now been started on CellCept after switching rheumatologist to [facility] Dr. Deanne Coffer.  This is on account of positive ANA and also positive myositis antibody and Raynaud's.  She is also had a right heart catheterization.  She has been started on room audio.  She continues to be very minimally symptomatic or asymptomatic from the shortness of breath cough and fatigue standpoint.  She she also wanted to get a second opinion for lung disease.  She is taken an appointment Dr. Marcelene Butte at [facility].  She did have pulmonary function test in August 2023.  Some except for admit as documented below.  However when she went there they only had a pulmonary function test scheduled but not the office appointment.  The office appointment is now  being scheduled for March 03, 2022 which is 3 months since the pulmonary function test.  Also at this time she is now on CellCept.  We took a shared decision making that she will have 1 more pulmonary function test it was the end of October 2023 to give a sequential timeline for Dr. Jon Billings to help with his decision making.  We discussed the fact that nintedanib would be the next agent and whether we will start it now or wait till if any progression.  This will be a question that would be post to Dr. Jon Billings.  The pulmonary function test data should help.  We discussed her respiratory vaccines but currently she wants to hold off.  Of note she has a 5 mm lingular nodule in March 2023.  She is a remote smoker.  We will get a repeat CT scan of the chest in spring or summer 2024.    PFT  OV 04/12/2022  Subjective:  Patient ID: Chelsea Callahan, female , DOB: [birth year] , age 85 y.o. , MRN: [id number] , ADDRESS: [address] [place] [address] PCP Elenore Paddy, NP Patient Care Team: Elenore Paddy, NP as PCP - General (Nurse Practitioner)  This Provider for this visit: Treatment Team:  Attending Provider: Kalman Shan, MD    04/12/2022 -   Chief Complaint  Patient presents with   Follow-up    Pt  states she has been doing okay since last visit. States she has had an infection in her mouth that she states her dentist does not know how to treat.     HPI Chelsea Callahan 72 y.o. -returns for follow-up.  She continues on CellCept 100 mg twice daily.  After seeing me last time she had pulmonary function test that shows stability [see below].  She then saw Dr. Marcelene Butte.  He advised continuing current course with CellCept 5 mg twice daily and then consider add-on therapy.  They discussed biopsy.  But currently that was being withheld.  He also felt it was too early for lung transplant.  He did weigh in on adding a second drug of going up on CellCept but in balance they took a shared decision making to continue current course.  Currently she is content continuing the current course.  She has seen Dr. Deanne Coffer in rheumatology recently and had blood work for her CellCept and she said it is normal.  We again discussed vaccines but she declined.  I did emphasize the risk that comes with respiratory infections in the setting of immunosuppression pulmonary fibrosis but she prefers to defer vaccination.  I then advised her to stay away from human clusters especially during the fall and winter and avoid sick contacts.  She verbalized understanding.  We discussed continued serial monitoring.  She is most accepting of this plan.  She will continue her CellCept at the current dose.  We discussed about ways to monitor her symptoms and if there is any change in her shortness of breath between now and the next visit she will let us know.     OV 07/07/2022  Subjective:  Patient ID: Chelsea Callahan, female , DOB: 1950/06/19 , age 83 y.o. , MRN: [id number] , ADDRESS: [address] [place] [address] PCP Elenore Paddy, NP Patient Care Team: Elenore Paddy, NP as PCP - General (Nurse Practitioner)  This Provider for this visit: Treatment Team:  Attending Provider: Kalman Shan, MD    07/07/2022 -   Chief  Complaint  Patient presents with   Follow-up    Ct f/u      HPI Chelsea Callahan 72 y.o. -returns for follow-up.  She continues to be immunosuppressed.  She takes CellCept regularly.  She feels stable minimal symptoms she walks 2 miles per day.  Her husband is here with her.  She is very anxious about his CT scan results which shows continued stability of her ILD.  In the same context her symptom score is unchanged and a walking desaturation test is stable.  Her last pulmonary function test was in October 2023.  She is really relieved about the result.  Of note she has a 5 mm lingular nodule and this is also stable.  She says she has upcoming dental surgery.  She has a dental implant that 29 or 71 years old.  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  She is being recommended extraction and then replacement at least partially.  She is nervous about this but she has noticed pus intermittently coming from her gums.  This past predates her starting CellCept.  I have indicated to her to hold the CellCept before and after the surgery to be extra cautious.  Did discuss the risk would be progressive ILD but the risk would be low given current stability.    OV 12/23/2022  Subjective:  Patient ID: Chelsea Callahan, female , DOB: January 19, 1951 , age 42 y.o. , MRN: [id number] , ADDRESS: [address] [place] [address] PCP Elenore Paddy, NP Patient Care Team: Elenore Paddy, NP as PCP - General (Nurse Practitioner)  This Provider for this visit: Treatment Team:  Attending Provider: Kalman Shan, MD    12/23/2022 -   Chief Complaint  Patient presents with   Follow-up    F/up on PFT,      #Undifferentiated connective tissue disease  -Myositis antibody negative's 2023 - Raynaud's + -ANA +2023 -On CellCept through Dr. Deanne Coffer since October 05, 2021 -Also on Plaquenil -[employer] second opinion Dr. 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  -Crackles on presentation  -Very mild burden.  Essentially asymptomatic normal PFTs 2023  #Mild pulmonary hypertension right heart cath June 2023 --Dr. Gala Romney  -Pulmonary capillary wedge pressure 17, PVR 2.1, PA mean 28  -Revatio started by Dr. Gala Romney fper history  #5 mm lingula nodule March 2023 -> march 2024 without change  #Grade 2 diastolic dysfunction on echocardiogram June 2024.  Type of visit: Video Virtual Visit Identification of patient Chelsea Callahan with 04/29/1951 and MRN [id number] - 2 person identifier Risks: Risks, benefits, limitations of telephone visit explained. Patient understood and verbalized agreement to proceed Anyone else on call: no Patient location: her home This provider location: [address]; [place]; [place] [address]. [facility]. [phone]    HPI Chelsea Callahan 72 y.o. -returns for follow-up.  This is a video visit.  Last pulmonary function test was in June 2024 and showed a 10% decline in FVC.  She tells me overall for her activities of daily living she is stable but sometimes she feels she might be more short of breath.  Otherwise no Interim Health status: No new complaints No new medical problems. No new surgeries. No ER visits. No Urgent care visits.   Her upcoming appointment at [facility] ILD clinic is in the December 2024.  Of note she did see Dr. Gala Romney and she had an echocardiogram in June 2024 she had grade  2 grade 2 diastolic dysfunction.  Because of lack of subjective improvement he stopped of audio.  We discussed the most recent pulmonary function test and the decline.  She was about to get another pulmonary function test I had of this visit but this was not scheduled because of the backlog in our office.  She is very upset.  She feels that she is being victimized because of our office inefficiencies.  I initially discussed that it could be a 14-month wait for pulmonary function test.  She  does not want to wait that long.  She feels she has a terminal disease.  Every chart to our office scheduling to see if we can get her spirometry and DLCO done either at our office or in the hospital within the next few weeks.  Patient is okay with this plan.  If this is not possible I will have to look for alternate places where the pulmonary function test can be done.        SYMPTOM SCALE - ILD 01/25/2022 01/25/2022  04/12/2022  07/07/2022   Current weight      O2 use ra ra ra ra  Shortness of Breath 0 -> 5 scale with 5 being worst (score 6 If unable to do)     At rest 000 0 0 0  Simple tasks - showers, clothes change, eating, shaving 0 0 0 0  Household (dishes, doing bed, laundry) 0 0 1 0  Shopping 00 0 1 0  Walking level at own pace 0 0 1 0  Walking up Stairs 0  1+ 1.5  Total (30-36) Dyspnea Score 0 00 4 1.5  How bad is your cough? 0 0 0 0  How bad is your fatigue 0 0 1 0  How bad is nausea 0 0 0 0  How bad is vomiting?  0 0 0 0  How bad is diarrhea? 0 0 0 0  How bad is anxiety? 0 0 2 4  How bad is depression 2 2 1 1   Any chronic pain - if so where and how bad 1 1 x x      Simple office walk 185 feet x  3 laps goal with forehead probe 08/26/2021  10/08/2021  07/07/2022    O2 used ra RA ra   Number laps completed C2 of 3 3 3    Comments about pace x normal    Resting Pulse Ox/HR 100% and 83/min 100% and 58/min 100% and HR 72   Final Pulse Ox/HR 100% and 130/min 100% and 133/min 100% and HR 137   Desaturated </= 88% no No    Desaturated <= 3% points no No    Got Tachycardic >/= 90/min yes Yes    Symptoms at end of test x x    Miscellaneous comments x x      CT Chest data from date: 07/05/22  - personally visualized and independently interpreted : NO - my findings are: none other than see offical report   IMPRESSION: 1. Today's examination is generally somewhat limited by breath motion artifact within this limitation, no obvious interval change in mild pulmonary  fibrosis in a pattern with apical to basal gradient, featuring irregular peripheral interstitial opacity, septal thickening, and small areas of subpleural bronchiolectasis at the lung bases without clear evidence of honeycombing. Findings are categorized as probable UIP per consensus guidelines: Diagnosis of Idiopathic Pulmonary Fibrosis: An Official ATS/ERS/JRS/ALAT Clinical Practice Guideline. Am J Respir Crit Care Med Vol 198, Iss 5,  [id number], Dec 31 2016. 2. Unchanged 0.5 cm nodule of the posterior lingula, benign, for which no further follow-up or characterization is required. 3. Cardiomegaly and coronary artery disease.   Aortic Atherosclerosis (ICD10-I70.0).     Electronically Signed   By: Jearld Lesch M.D.   On: 07/06/2022 15:51   PFT     Latest Ref Rng & Units 10/19/2022    2:52 PM 02/28/2022    2:21 PM 10/08/2021   10:54 AM  ILD indicators  FVC-Pre L 1.92  2.14  2.14   FVC-Predicted Pre % 70  84  110   FVC-Post L   2.09   FVC-Predicted Post %   107   TLC L   3.64   TLC Predicted %   81   DLCO uncorrected ml/min/mmHg 15.75  17.35  17.27   DLCO UNC %Pred % 87  102  102   DLCO Corrected ml/min/mmHg 15.75  18.39  17.27   DLCO COR %Pred % 87  108  102       LAB RESULTS last 96 hours No results found.  LAB RESULTS last 90 days Recent Results (from the past 2160 hour(s))  Pulmonary function test     Status: None   Collection Time: 10/19/22  2:52 PM  Result Value Ref Range   FVC-Pre 1.92 L   FVC-%Pred-Pre 70 %   FEV1-Pre 1.64 L   FEV1-%Pred-Pre 80 %   FEV6-Pre 1.89 L   FEV6-%Pred-Pre 72 %   Pre FEV1/FVC ratio 86 %   FEV1FVC-%Pred-Pre 113 %   Pre FEV6/FVC Ratio 100 %   FEV6FVC-%Pred-Pre 104 %   FEF 25-75 Pre 2.08 L/sec   FEF2575-%Pred-Pre 119 %   DLCO unc 15.75 ml/min/mmHg   DLCO unc % pred 87 %   DLCO cor 15.75 ml/min/mmHg   DLCO cor % pred 87 %   DL/VA 2.84 ml/min/mmHg/L   DL/VA % pred 132 %  ECHOCARDIOGRAM COMPLETE     Status: None   Collection  Time: 11/01/22  1:46 PM  Result Value Ref Range   S' Lateral 2.90 cm   Single Plane A4C EF 59.7 %   Single Plane A2C EF 57.8 %   Calc EF 58.5 %   Area-P 1/2 2.60 cm2   Est EF 55 - 60%          has a past medical history of Anemia, 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HCC), Blood transfusion without reported diagnosis, Cataract, GERD (gastroesophageal reflux disease), Primary osteoarthritis of right knee, Raynaud disease, Raynaud disease, Spinal headache, and Vasculitis (HCC).   reports that she quit smoking about 25 years ago. Her smoking use included cigarettes. She started smoking about 40 years ago. She has a 3.8 pack-year smoking history. She has been exposed to tobacco smoke. She has never used smokeless tobacco.  Past Surgical History:  Procedure Laterality Date   abdominal clip     to stomach post gastric bypass   AXILLARY LYMPH NODE BIOPSY Right 01/27/2021   Procedure: RIGHT AXILLARY LYMPH NODE BIOPSY;  Surgeon: Harriette Bouillon, MD;  Location: [facility];  Service: General;  Laterality: Right;   CESAREAN SECTION     x 2   GASTRIC BYPASS     HERNIA REPAIR  2010   inverted hermia   HIP ARTHROPLASTY     JOINT REPLACEMENT Right 2005   hip   JOINT REPLACEMENT Bilateral    knee    KNEE ARTHROPLASTY Left 09/13/2013   Procedure: LEFT COMPUTER ASSISTED TOTAL KNEE  ARTHROPLASTY;  Surgeon: Eldred Manges, MD;  Location: [facility] OR;  Service: Orthopedics;  Laterality: Left;  Left Total Knee Arthroplasty, Computer Assist   KNEE ARTHROPLASTY Right 05/23/2014   Procedure: COMPUTER ASSISTED TOTAL KNEE ARTHROPLASTY;  Surgeon: Eldred Manges, MD;  Location: [facility] OR;  Service: Orthopedics;  Laterality: Right;   LUMBAR LAMINECTOMY/DECOMPRESSION MICRODISCECTOMY N/A 05/13/2016   Procedure: L4-5 Decompression;  Surgeon: Eldred Manges, MD;  Location: [facility] OR;  Service: Orthopedics;  Laterality: N/A;   RIGHT HEART CATH N/A 10/27/2021   Procedure: RIGHT HEART CATH;  Surgeon: Dolores Patty, MD;  Location:  [facility] INVASIVE CV LAB;  Service: Cardiovascular;  Laterality: N/A;   teeth etraction     TOTAL KNEE ARTHROPLASTY Right 05/22/2014   dr Ophelia Charter   TUBAL LIGATION  1981   UPPER GI ENDOSCOPY      Allergies  Allergen Reactions   Ginger Swelling and Other (See Comments)    Stomach swells   Pork-Derived Products Other (See Comments)    UNSPECIFIED "PERSONAL REASONS"   Shellfish Allergy Other (See Comments)    UNSPECIFIED "Personal reasons/dietary"    Immunization History  Administered Date(s) Administered   PFIZER Comirnaty(Gray Top)Covid-19 Tri-Sucrose Vaccine 05/21/2019, 06/11/2019, 04/05/2020, 10/13/2020   PFIZER(Purple Top)SARS-COV-2 Vaccination 05/21/2019, 06/11/2019, 04/05/2020   Zoster Recombinant(Shingrix) 03/08/2018, 05/08/2018   Zoster, Unspecified 03/08/2018, 05/08/2018    Family History  Problem Relation Age of Onset   Cancer Brother    Multiple myeloma Brother    Colon cancer Neg Hx    Esophageal cancer Neg Hx    Rectal cancer Neg Hx    Stomach cancer Neg Hx      Current Outpatient Medications:    Cholecalciferol ([employer] D3) 25 MCG (1000 UT) capsule, Take 1,000 Units by mouth daily., Disp: , Rfl:    Fe Bisgly-Succ-C-Thre-B12-FA (IRON-150 PO), Take 1 tablet by mouth daily., Disp: , Rfl:    hydroxychloroquine (PLAQUENIL) 200 MG tablet, TAKE 1 TABLET BY MOUTH EVERY DAY, Disp: 90 tablet, Rfl: 0   Multiple Vitamin (MULTIVITAMIN WITH MINERALS) TABS tablet, Take 1 tablet by mouth daily., Disp: , Rfl:    mycophenolate (CELLCEPT) 500 MG tablet, Take 500 mg by mouth 2 (two) times daily., Disp: , Rfl:    OVER THE COUNTER MEDICATION, Take 1 capsule by mouth daily. [place], Disp: , Rfl:    psyllium (METAMUCIL) 58.6 % packet, Take 1 packet by mouth daily as needed (constipation)., Disp: , Rfl:    aspirin EC 81 MG tablet, Take 81 mg by mouth daily. Swallow whole. (Patient not taking: Reported on 10/25/2022), Disp: , Rfl:    sildenafil (REVATIO) 20 MG tablet, Take 1 tablet (20 mg total)  by mouth 3 (three) times daily. Please call to schedule follow up [phone] (Patient not taking: Reported on 10/25/2022), Disp: 90 tablet, Rfl: 0      Objective:   There were no vitals filed for this visit.  Estimated body mass index is 30.73 kg/m as calculated from the following:   Height as of 10/28/22: 5\' 2"  (1.575 m).   Weight as of 10/28/22: 168 lb (76.2 kg).  @WEIGHTCHANGE @  There were no vitals filed for this visit.   Physical Exam   General: No distress. Looks well O2 at rest: no Cane present: no Sitting in wheel chair: no Frail: no Obese: no Neuro: Alert and Oriented x 3. GCS 15. Speech normal Psych: Pleasant        Assessment:       ICD-10-CM   1.  Interstitial lung disease due to connective tissue disease (HCC)  J84.89    M35.9     2. Raynaud's disease without gangrene  I73.00     3. ANA positive  R76.8     4. 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 On Cellcept therapy  Z79.899     6. Immunosuppressed status (HCC)  D84.9     7. WHO group 1 pulmonary arterial hypertension (HCC)  I27.21          Plan:     Patient Instructions     ICD-10-CM   1. Interstitial lung disease due to connective tissue disease (HCC)  J84.89    M35.9     2. Raynaud's disease without gangrene  I73.00     3. ANA positive  R76.8     4. 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 On Cellcept therapy  Z79.899     6. Immunosuppressed status (HCC)  D84.9     7. Vaccine counseling  Z71.85      Interstitial lung disease due to connective tissue disease (HCC) Raynaud's disease without gangrene ANA positive Myositis associated antibody positive On Cellcept therapy Immunosuppressed status (HCC)   - tolerating well cellcept 500mg  twice daily since May/June 2023 via Dr Deanne Coffer - 2nd opinion at [facility] Dr Jon Billings - Nov 2023: continue current course - Visit 12/23/2022 - ILD could be getting worse based on sympotoms and June 2024 PFT  Plan - continue  cellcept via Dr Deanne Coffer   - do spirometry and dlco in NEXT <= 2 weeks - monitor symptoms  -any worsening then consider adding new medications (ofev or Rituxan or increaseing cellcept)  - keep [place] posted about any respiratory infections  Pulmonary Hypertension  - off  revatio  Plan - per  Dr Teressa Lower  5mm Lingular Nodule March 2023 -> no change MArch 2024  Plan  - expectant followup     Followup  - 2-3 weeks after spiromery and dlco with Dr Marchelle Gearing; 30 min visit  - symptoms score and walk test at followup   FOLLOWUP Return in about 3 weeks (around 01/13/2023) for after Cleda Daub and DLCO, ILD, with Dr Marchelle Gearing.    SIGNATURE    Dr. Kalman Shan, M.D., F.C.C.P,  Pulmonary and Critical Care Medicine Staff Physician, [facility] Center Director - Interstitial Lung Disease  Program  [facility] at [facility] [place], [place], [address]  Pager: [phone], If no answer or between  15:00h - 7:00h: call [phone] Telephone: [phone]  10:51 AM 12/23/2022

## 2022-12-26 ENCOUNTER — Other Ambulatory Visit (HOSPITAL_COMMUNITY): Payer: Self-pay

## 2022-12-26 ENCOUNTER — Telehealth: Payer: Self-pay | Admitting: Nurse Practitioner

## 2022-12-26 ENCOUNTER — Telehealth: Payer: Self-pay

## 2022-12-26 NOTE — Telephone Encounter (Signed)
Pharmacy Patient Advocate Encounter   Received notification from Pt Calls Messages that prior authorization for [facility] 0.38ml/0.5ml is required/requested.   Insurance verification completed.   The patient is insured through [employer] [facility] [employer] .   Per test claim: PA required; PA submitted to [employer] [facility] via CoverMyMeds Key/confirmation #/EOC  [id number] Status is pending

## 2022-12-26 NOTE — Telephone Encounter (Addendum)
Please initiate prior authorization for wegovy for treatment of obesity. Last BMI was 30.73. She has pulmonary hypertension as well, patient would like this to be specified in prior authorization in case insurance recognizes this as a comorbid condition. If insurance approves I will prescribe medication for patient. Please let me know what the outcome with insurance is. Thank you.

## 2022-12-27 ENCOUNTER — Ambulatory Visit (INDEPENDENT_AMBULATORY_CARE_PROVIDER_SITE_OTHER): Payer: Medicare Other | Admitting: Internal Medicine

## 2022-12-27 ENCOUNTER — Encounter: Payer: Self-pay | Admitting: Internal Medicine

## 2022-12-27 VITALS — BP 120/72 | HR 82 | Ht 62.0 in | Wt 172.6 lb

## 2022-12-27 DIAGNOSIS — J8489 Other specified interstitial pulmonary diseases: Secondary | ICD-10-CM

## 2022-12-27 DIAGNOSIS — Q998 Other specified chromosome abnormalities: Secondary | ICD-10-CM

## 2022-12-27 DIAGNOSIS — M359 Systemic involvement of connective tissue, unspecified: Secondary | ICD-10-CM

## 2022-12-27 DIAGNOSIS — Z79899 Other long term (current) drug therapy: Secondary | ICD-10-CM | POA: Diagnosis not present

## 2022-12-27 DIAGNOSIS — D849 Immunodeficiency, unspecified: Secondary | ICD-10-CM | POA: Diagnosis not present

## 2022-12-27 DIAGNOSIS — I7300 Raynaud's syndrome without gangrene: Secondary | ICD-10-CM | POA: Diagnosis not present

## 2022-12-27 LAB — PULMONARY FUNCTION TEST
DL/VA % pred: 106 %
DL/VA: 4.46 ml/min/mmHg/L
DLCO cor % pred: 85 %
DLCO cor: 15.4 ml/min/mmHg
DLCO unc % pred: 85 %
DLCO unc: 15.4 ml/min/mmHg
FEF 25-75 Pre: 2.8 L/sec
FEF2575-%Pred-Pre: 160 %
FEV1-%Pred-Pre: 87 %
FEV1-Pre: 1.79 L
FEV1FVC-%Pred-Pre: 118 %
FEV6-%Pred-Pre: 76 %
FEV6-Pre: 1.99 L
FEV6FVC-%Pred-Pre: 104 %
FVC-%Pred-Pre: 73 %
FVC-Pre: 1.99 L
Pre FEV1/FVC ratio: 90 %
Pre FEV6/FVC Ratio: 100 %

## 2022-12-27 NOTE — Patient Instructions (Addendum)
ICD-10-CM   1. Interstitial lung disease due to connective tissue disease (HCC)  J84.89    M35.9     2. Raynaud's disease without gangrene  I73.00     3. ANA positive  R76.8     4. 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 On Cellcept therapy  Z79.899     6. Immunosuppressed status (HCC)  D84.9     7. Vaccine counseling  Z71.85      Interstitial lung disease due to connective tissue disease (HCC) Raynaud's disease without gangrene ANA positive Myositis associated antibody positive On Cellcept therapy Immunosuppressed status (HCC)   - tolerating well cellcept 500mg  twice daily since May/June 2023 via Dr Deanne Coffer - 2nd opinion at [facility] Dr Jon Billings - Nov 2023: continue current course - Visit 12/27/2022   - PFt June 2024/Aug 2024 are slightly worse than June 2023 but still within normal range - Simple exercise test without o2 drop  Plan - continue cellcept via Dr Deanne Coffer  - monitor symptoms  -any worsening then consider adding new medications (ofev or Rituxan or increaseing cellcept)  - keep [place] posted about any respiratory infections - return in 7 weeks for close monitoring  - will do symptoms and clinical evaluation - keep [facility] appt iwht Dr Jon Billings in dec 2024  Pulmonary Hypertension  - off  revatio  Plan - per  Dr Teressa Lower  5mm Lingular Nodule March 2023 -> no change MArch 2024  Plan  - expectant followup  Anxiety   - you have marked significant anxiety score ssince dec 2023  Plan  - would it help you if we referred you to a counselor?     Followup  -7 weeks after spiromery and dlco with Dr Marchelle Gearing; 15 min visit  - symptoms score and sit/stand test at followup

## 2022-12-27 NOTE — Progress Notes (Signed)
Spiro/DLCO performed today. 

## 2022-12-27 NOTE — Progress Notes (Signed)
OV 08/26/2021 - new ILD [facility]. Referred by Dr Pollyann Savoy  Subjective:  Patient ID: Chelsea Callahan, female , DOB: 05-Apr-1951 , age 72 y.o. , MRN: [id number] , ADDRESS: [address] [place] [address] PCP Elenore Paddy, NP Patient Care Team: Elenore Paddy, NP as PCP - General (Nurse Practitioner)  This Provider for this visit: Treatment Team:  Attending Provider: Kalman Shan, MD    08/26/2021 -   Chief Complaint  Patient presents with   Consult    Referred for possible ILD     HPI Chelsea Callahan 72 y.o. -referred by Dr Naida Sleight.  72 year old female.  She used to live in [place].  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  Then in 2013 she relocated to [facility] along with her husband.  This is for her retirement.  She tells me that approximately 13 years ago she developed Raynaud's symptoms.  She also started having some alopecia may be a year or 2 before that.  This then resulted in a positive ANA testing.  She then started following with a rheumatologist for 5 years before she relocated to [facility] and for the last 8 or 9 years has established care with Dr. Corliss Skains.  Her ANA here is also positive [see below].  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  She is followed periodically.  She is on Plaquenil.  She has arthritis but this is osteoarthritis.  She denies any scleroderma findings or rheumatoid arthritis findings of malar rash or oral ulcers or hematuria or other connective tissue disease findings beyond the Raynaud's.  During the course of routine follow-up this year she was discovered to have crackles.  The notes also confirmed this.  This resulted in a high-resolution CT chest that shows interstitial lung disease predominantly with a craniocaudal gradient.  Reticulation and some traction  bronchiectasis but no honeycombing.  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  Therefore she has been referred here.  She tells me that she prefers to be very aggressive with the treatment and management approach.  She has a life goal of living up to 90 years.  Currently she is able to walk 2 miles every 2 weeks.  She never stops.   She does not feel much short of breath.  Initially she has some chest tightness and this resolves.  Is not much of a cough.  Past medical history - Negative for any specific connective tissue disease.  Negative for hiatal hernia and acid reflux. - Positive for ANA + and low titer rheumatoid factor [discussion with Dr. Algis Downs today:: Undifferentiated connective tissue disease:] - She has had the COVID disease in January 2022.  She also had COVID-vaccine 4 shots.  Review of systems - Raynaud's present - Osteoarthritis present  Family history of pulmonary disease - Brother has myasthenia gravis but otherwise negative  Exposure history - Smoked from [age] although in between she stopped between 1979 and 1983.  3 to 5 cigarettes/day.  She did some marijuana as a teenager.  Otherwise no cocaine use or no intravenous drug use.  Home and Hobby details - [place]  She has lived there for 2 years.  Is a private home.  Detail organic antigen exposure history in this house is negative.  She is never used feather pillow or down jackets.  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  Detail organic and inorganic antigen exposure history at work is negative  Pulmonary toxicity history - Denies any adverse medication intake    AUOTIMMUNE   Latest Reference Range & Units 10/17/16 12:14 08/17/17 14:55 03/07/18 14:48 07/03/19 11:32 06/16/20 14:15 11/13/20 00:00 07/06/21 15:20  Anti Nuclear Antibody (ANA) NEGATIVE        POSITIVE !  ANA Pattern 1        Nuclear, Nucleolar !  ANA Titer 1 titer       1:1,280 (H)  ds DNA Ab  IU/mL 1 1 1 1 1 1 1   ENA SM Ab Ser-aCnc <1.0 NEG AI       <1.0 NEG  !: Data is abnormal (H): Data is abnormally high  Latest Reference Range & Units 07/06/21 15:20  Ribonucleic Protein(ENA) Antibody, IgG <1.0 NEG AI <1.0 NEG  ENA SM Ab Ser-aCnc <1.0 NEG AI <1.0 NEG  SSA (Ro) (ENA) Antibody, IgG <1.0 NEG AI <1.0 NEG  SSB (La) (ENA) Antibody, IgG <1.0 NEG AI <1.0 NEG  Scleroderma (Scl-70) (ENA) Antibody, IgG <1.0 NEG AI <1.0 NEG    Latest Reference Range & Units 10/17/16 12:14 08/17/17 14:55 03/07/18 14:48 07/03/19 11:32 06/16/20 14:15 11/13/20 00:00 07/06/21 15:20  Sed Rate 0 - 30 mm/h  9 1 6 6 9 11   Anticardiolipin Ab,IgA,Qn APL <11 <11       Anticardiolipin Ab,IgG,Qn GPL <14 <14       Anticardiolipin Ab,IgM,Qn MPL <12 <12        CT Chest data - HRCT March 2023  Narrative & Impression  CLINICAL DATA:  Autoimmune disease, crackles in chest.   EXAM: CT CHEST WITHOUT CONTRAST   TECHNIQUE: Multidetector CT imaging of the chest was performed following the standard protocol without intravenous contrast. High resolution imaging of the lungs, as well as inspiratory and expiratory imaging, was performed.   RADIATION DOSE REDUCTION: This exam was performed according to the departmental dose-optimization program which includes automated exposure control, adjustment of the mA and/or kV according to patient size and/or use of iterative reconstruction technique.   COMPARISON:  None.   FINDINGS: Cardiovascular: Atherosclerotic calcification of the aorta, aortic valve and coronary arteries. Enlarged right and left pulmonary arteries and heart. Small amount of pericardial fluid may be physiologic.   Mediastinum/Nodes: Mediastinal lymph nodes measure up to 10 mm in the AP window. Hilar regions are difficult to evaluate without IV contrast. Small axillary and subpectoral lymph nodes with the largest measuring 1.4 cm in the right axilla. Esophagus is grossly unremarkable.    Lungs/Pleura: Peripheral basilar subpleural reticulation, ground-glass and traction bronchiectasis/bronchiolectasis. No definitive honeycombing. Probable subpleural lymph nodes along the right major fissure, measuring up to 5 mm. Calcified granulomas. Noncalcified 5 mm lingular nodule (4 x 6 mm, 6/73). No pleural fluid. Airway is unremarkable. Mild air trapping.   Upper Abdomen: Visualized portion of the liver is unremarkable. Stone in the gallbladder. Visualized portions of the adrenal glands, kidneys, spleen and pancreas are grossly unremarkable. Gastric bypass.   Musculoskeletal: Degenerative changes in the spine. Old right rib fractures. No worrisome lytic or sclerotic lesions.   IMPRESSION: 1. Pulmonary parenchymal pattern of interstitial lung disease may be due to usual interstitial pneumonitis or nonspecific interstitial pneumonitis. Findings are categorized as probable UIP per consensus guidelines: Diagnosis of Idiopathic Pulmonary Fibrosis: An Official ATS/ERS/JRS/ALAT Clinical Practice Guideline. Am Rosezetta Schlatter Crit Care Med Vol  198, Iss 5, [id number], Dec 31 2016. 2. Small amount of pericardial fluid may be physiologic. 3. 5 mm lingular nodule. If the patient is high-risk, a non-contrast chest CT can be considered in 12 months.This recommendation follows the consensus statement: Guidelines for Management of Incidental Pulmonary Nodules Detected on CT Images: From the Fleischner Society 2017; Radiology 2017; 284:228-243. 4. Small to borderline enlarged mediastinal and axillary lymph nodes. Difficult to exclude a lymphoproliferative disorder. 5. Cholelithiasis. 6. Aortic atherosclerosis (ICD10-I70.0). Coronary artery calcification. 7. Enlarged pulmonary arteries, indicative of pulmonary arterial hypertension. 8.  Emphysema (ICD10-J43.9).     Electronically Signed   By: Leanna Battles M.D.   On: 07/19/2021 10:34         10/08/2021 Patient presents today for  follow-up.  She has a history of undifferentiated connective tissue disease.  She follows with rheumatology.  HRCT imaging in March showed pulmonary parenchymal pattern of interstitial lung disease, findings are categorized as probable UIP.  One antibody in myositis panel was positive.  Recently been placed on CellCept by Dr. Deanne Coffer with Rheumatology, awaiting results of hep panel before starting medication.  Pulmonary function testing today was normal.  Simple walk test without oxygen desaturation.  She is largely asymptomatic.  She walks on average 2 miles a day 5 times a week.  Only respiratory complaint is some dyspnea symptoms about halfway through her walk.  She has no significant shortness of breath, cough, chest tightness or wheezing.  Pulmonary function testing 10/08/2021 FVC 2.09 (107%), FEV1 1.87 (125%), ratio 90, TLC 81%, DLCOunc 102%  RHC 10/27/21  Findings:   RA = 8 RV = 45/11 PA = 45/13 (28) PCW = 17 Fick cardiac output/index = 5.2/2.9 PVR = 2.1 FA sat = 97% PA sat = 69%, 70% SVC sat = 66%   Assessment: 1. 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 Marchelle Gearing. Likely start macitentan    Arvilla Meres, MD  1:40 PM   OV 01/25/2022  Subjective:  Patient ID: Chelsea Callahan, female , DOB: April 18, 1951 , age 47 y.o. , MRN: [id number] , ADDRESS: [address] [place] [address] PCP Elenore Paddy, NP Patient Care Team: Elenore Paddy, NP as PCP - General (Nurse Practitioner)  This Provider for this visit: Treatment Team:  Attending Provider: Kalman Shan, MD    01/25/2022 -   Chief Complaint  Patient presents with   Follow-up    PFT performed 8/11 at [facility].  Pt states she has been doing okay since last visit and denies any complaints.   HPI Chelsea Callahan 72 y.o. -returns for follow-up.  Since I last personally met her she has now been started on CellCept after switching rheumatologist to [facility] Dr. Deanne Coffer.  This is on account of positive ANA and also positive myositis antibody and Raynaud's.  She is also had a right heart catheterization.  She has been started on room audio.  She continues to be very minimally symptomatic or asymptomatic from the shortness of breath cough and fatigue standpoint.  She she also wanted to get a second opinion for lung disease.  She is taken an appointment Dr. Marcelene Butte at [facility].  She did have pulmonary function test in August 2023.  Some except for admit as documented below.  However when she went there they only had a pulmonary function test scheduled but not the office appointment.  The office appointment is now  being scheduled for March 03, 2022 which is 3 months since the pulmonary function test.  Also at this time she is now on CellCept.  We took a shared decision making that she will have 1 more pulmonary function test it was the end of October 2023 to give a sequential timeline for Dr. Jon Billings to help with his decision making.  We discussed the fact that nintedanib would be the next agent and whether we will start it now or wait till if any progression.  This will be a question that would be post to Dr. Jon Billings.  The pulmonary function test data should help.  We discussed her respiratory vaccines but currently she wants to hold off.  Of note she has a 5 mm lingular nodule in March 2023.  She is a remote smoker.  We will get a repeat CT scan of the chest in spring or summer 2024.    PFT  OV 04/12/2022  Subjective:  Patient ID: Chelsea Callahan, female , DOB: 1951/04/08 , age 14 y.o. , MRN: [id number] , ADDRESS: [address] [place] [address] PCP Elenore Paddy, NP Patient Care Team: Elenore Paddy, NP as PCP - General (Nurse Practitioner)  This Provider for this visit: Treatment Team:  Attending Provider: Kalman Shan, MD    04/12/2022 -   Chief Complaint  Patient presents with   Follow-up    Pt  states she has been doing okay since last visit. States she has had an infection in her mouth that she states her dentist does not know how to treat.     HPI Chelsea Callahan 72 y.o. -returns for follow-up.  She continues on CellCept 100 mg twice daily.  After seeing me last time she had pulmonary function test that shows stability [see below].  She then saw Dr. Marcelene Butte.  He advised continuing current course with CellCept 5 mg twice daily and then consider add-on therapy.  They discussed biopsy.  But currently that was being withheld.  He also felt it was too early for lung transplant.  He did weigh in on adding a second drug of going up on CellCept but in balance they took a shared decision making to continue current course.  Currently she is content continuing the current course.  She has seen Dr. Deanne Coffer in rheumatology recently and had blood work for her CellCept and she said it is normal.  We again discussed vaccines but she declined.  I did emphasize the risk that comes with respiratory infections in the setting of immunosuppression pulmonary fibrosis but she prefers to defer vaccination.  I then advised her to stay away from human clusters especially during the fall and winter and avoid sick contacts.  She verbalized understanding.  We discussed continued serial monitoring.  She is most accepting of this plan.  She will continue her CellCept at the current dose.  We discussed about ways to monitor her symptoms and if there is any change in her shortness of breath between now and the next visit she will let us know.     OV 07/07/2022  Subjective:  Patient ID: Chelsea Callahan, female , DOB: 26-Apr-1951 , age 3 y.o. , MRN: [id number] , ADDRESS: [address] [place] [address] PCP Elenore Paddy, NP Patient Care Team: Elenore Paddy, NP as PCP - General (Nurse Practitioner)  This Provider for this visit: Treatment Team:  Attending Provider: Kalman Shan, MD    07/07/2022 -   Chief  Complaint  Patient presents with   Follow-up    Ct f/u      HPI Chelsea Callahan 72 y.o. -returns for follow-up.  She continues to be immunosuppressed.  She takes CellCept regularly.  She feels stable minimal symptoms she walks 2 miles per day.  Her husband is here with her.  She is very anxious about his CT scan results which shows continued stability of her ILD.  In the same context her symptom score is unchanged and a walking desaturation test is stable.  Her last pulmonary function test was in October 2023.  She is really relieved about the result.  Of note she has a 5 mm lingular nodule and this is also stable.  She says she has upcoming dental surgery.  She has a dental implant that 27 or 71 years old.  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  She is being recommended extraction and then replacement at least partially.  She is nervous about this but she has noticed pus intermittently coming from her gums.  This past predates her starting CellCept.  I have indicated to her to hold the CellCept before and after the surgery to be extra cautious.  Did discuss the risk would be progressive ILD but the risk would be low given current stability.    OV 12/23/2022  Subjective:  Patient ID: Chelsea Callahan, female , DOB: 1950-12-28 , age 47 y.o. , MRN: [id number] , ADDRESS: [address] [place] [address] PCP Elenore Paddy, NP Patient Care Team: Elenore Paddy, NP as PCP - General (Nurse Practitioner)  This Provider for this visit: Treatment Team:  Attending Provider: Kalman Shan, MD    12/23/2022 -   Chief Complaint  Patient presents with   Follow-up    F/up on PFT,    Type of visit: Video Virtual Visit Identification of patient Chelsea Callahan with 1951-01-23 and MRN [id number] - 2 person identifier Risks: Risks, benefits, limitations of telephone visit explained. Patient understood and verbalized agreement to proceed Anyone else on call:  no Patient location: her home This provider location: [address], [address]; [place]; [place] [address]. [facility]. [phone]    HPI Chelsea Callahan 72 y.o. -returns for follow-up.  This is a video visit.  Last pulmonary function test was in June 2024 and showed a 10% decline in FVC.  She tells me overall for her activities of daily living she is stable but sometimes she feels she might be more short of breath.  Otherwise no Interim Health status: No new complaints No new medical problems. No new surgeries. No ER visits. No Urgent care visits.   Her upcoming appointment at [facility] ILD clinic is in the December 2024.  Of note she did see Dr. Gala Romney and she had an echocardiogram in June 2024 she had grade 2 grade 2 diastolic dysfunction.  Because of lack of subjective improvement he stopped of audio.  We discussed the most recent pulmonary function test and the decline.  She was about to get another pulmonary function test I had of this visit but this was not scheduled because of the backlog in our office.  She is very upset.  She feels that she is being victimized because of our office inefficiencies.  I initially discussed that it could be a 55-month wait for pulmonary function test.  She does not want to wait that long.  She feels she has a terminal disease.  Every chart to our  office scheduling to see if we can get her spirometry and DLCO done either at our office or in the hospital within the next few weeks.  Patient is okay with this plan.  If this is not possible I will have to look for alternate places where the pulmonary function test can be done.         CT Chest data from date: 07/05/22  - personally visualized and independently interpreted : NO - my findings are: none other than see offical report   IMPRESSION: 1. Today's examination is generally somewhat limited by breath motion artifact within this limitation, no obvious interval change in  mild pulmonary fibrosis in a pattern with apical to basal gradient, featuring irregular peripheral interstitial opacity, septal thickening, and small areas of subpleural bronchiolectasis at the lung bases without clear evidence of honeycombing. Findings are categorized as probable UIP per consensus guidelines: Diagnosis of Idiopathic Pulmonary Fibrosis: An Official ATS/ERS/JRS/ALAT Clinical Practice Guideline. Am Rosezetta Schlatter Crit Care Med Vol 198, Iss 5, [phone], Dec 31 2016. 2. Unchanged 0.5 cm nodule of the posterior lingula, benign, for which no further follow-up or characterization is required. 3. Cardiomegaly and coronary artery disease.   Aortic Atherosclerosis (ICD10-I70.0).     Electronically Signed   By: Jearld Lesch M.D.   On: 07/06/2022 15:51  OV 12/27/2022  Subjective:  Patient ID: Chelsea Callahan, female , DOB: 03-Jun-1950 , age 61 y.o. , MRN: [id number] , ADDRESS: [address] [place] [address] [place] [address] PCP Elenore Paddy, NP Patient Care Team: Elenore Paddy, NP as PCP - General (Nurse Practitioner)  This Provider for this visit: Treatment Team:  Attending Provider: Kalman Shan, MD    12/27/2022 -   Chief Complaint  Patient presents with   Follow-up    F/up on PFT    #Undifferentiated connective tissue disease  -Myositis antibody negative's 2023 - Raynaud's + -ANA +2023 -On CellCept through Dr. Deanne Coffer since October 05, 2021 -Also on Plaquenil -[employer] second opinion Dr. 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  - Last HRCT 07/05/22  -Crackles on presentation  -Very mild burden.  Essentially asymptomatic normal PFTs 2023  #Mild pulmonary hypertension right heart cath June 2023 --Dr. Gala Romney  -Pulmonary capillary wedge pressure 17, PVR 2.1, PA mean 28  -Revatio started by Dr. Gala Romney fper history  #5 mm lingula nodule March 2023 -> march 2024 without change  #Grade 2 diastolic dysfunction on  echocardiogram June 2024. +.   HPI Chelsea Callahan 72 y.o. -+ returns for follow-up after her video visit last week.  Actually 4 days ago.  She has had PFTs.  Pulmonary function test continues to be normal.  It is very similar to June 2024.  Although normal it is a slight decline compared to 1 year ago.  1 year ago it was supranormal.  Her symptom scores appears to be range bound.  She continues to have significant amount of anxiety.  She has upcoming visit at [facility] April 11, 2023 with Dr. Marcelene Butte ILD clinic.  We discussed about just following with him in December.  With him he took a shared decision making to see her at the midpoint just a clinical visit to make sure the ILD is stable.  Therefore see her back in 7 weeks.  Noticed her anxiety scores are positive hence increase since late last year.  Her last the CMA to ask her about her interest in seeing a counselor.  I noticed  that she is not on any SNRI or SSRI or anxiolytics.    SYMPTOM SCALE - ILD 01/25/2022 01/25/2022  04/12/2022  07/07/2022  12/27/2022   Current weight       O2 use ra ra ra ra ra  Shortness of Breath 0 -> 5 scale with 5 being worst (score 6 If unable to do)      At rest 000 0 0 0 0  Simple tasks - showers, clothes change, eating, shaving 0 0 0 0 0  Household (dishes, doing bed, laundry) 0 0 1 0 1  Shopping 00 0 1 0 1  Walking level at own pace 0 0 1 0 1  Walking up Stairs 0  1+ 1.5 1  Total (30-36) Dyspnea Score 0 00 4 1.5 4  How bad is your cough? 0 0 0 0 0  How bad is your fatigue 0 0 1 0 No sutre  How bad is nausea 0 0 0 0 1  How bad is vomiting?  0 0 0 0 0  How bad is diarrhea? 0 0 0 0 0  How bad is anxiety? 0 0 2 4 4   How bad is depression 2 2 1 1  0.5  Any chronic pain - if so where and how bad 1 1 x x x      Simple office walk 185 feet x  3 laps goal with forehead probe 08/26/2021  10/08/2021  07/07/2022  12/27/2022   O2 used ra RA ra ra  Number laps completed C2 of 3 3 3  Sit stand  x 15  Comments about pace x normal    Resting Pulse Ox/HR 100% and 83/min 100% and 58/min 100% and HR 72 100% and HR 79  Final Pulse Ox/HR 100% and 130/min 100% and 133/min 100% and HR 137 99% aHR 88  Desaturated </= 88% no No    Desaturated <= 3% points no No    Got Tachycardic >/= 90/min yes Yes    Symptoms at end of test x x  No dyspnea  Miscellaneous comments x x        PFT     Latest Ref Rng & Units 12/27/2022   10:50 AM 10/19/2022    2:52 PM 02/28/2022    2:21 PM 10/08/2021   10:54 AM  ILD indicators  FVC-Pre L 1.99  P 1.92  2.14  2.14   FVC-Predicted Pre % 73  P 70  84  110   FVC-Post L    2.09   FVC-Predicted Post %    107   TLC L    3.64   TLC Predicted %    81   DLCO uncorrected ml/min/mmHg 15.40  P 15.75  17.35  17.27   DLCO UNC %Pred % 85  P 87  102  102   DLCO Corrected ml/min/mmHg 15.40  P 15.75  18.39  17.27   DLCO COR %Pred % 85  P 87  108  102     P Preliminary result      LAB RESULTS last 96 hours No results found.  LAB RESULTS last 90 days Recent Results (from the past 2160 hour(s))  Pulmonary function test     Status: None   Collection Time: 10/19/22  2:52 PM  Result Value Ref Range   FVC-Pre 1.92 L   FVC-%Pred-Pre 70 %   FEV1-Pre 1.64 L   FEV1-%Pred-Pre 80 %   FEV6-Pre 1.89 L   FEV6-%Pred-Pre 72 %   Pre  FEV1/FVC ratio 86 %   FEV1FVC-%Pred-Pre 113 %   Pre FEV6/FVC Ratio 100 %   FEV6FVC-%Pred-Pre 104 %   FEF 25-75 Pre 2.08 L/sec   FEF2575-%Pred-Pre 119 %   DLCO unc 15.75 ml/min/mmHg   DLCO unc % pred 87 %   DLCO cor 15.75 ml/min/mmHg   DLCO cor % pred 87 %   DL/VA 1.61 ml/min/mmHg/L   DL/VA % pred 096 %  ECHOCARDIOGRAM COMPLETE     Status: None   Collection Time: 11/01/22  1:46 PM  Result Value Ref Range   S' Lateral 2.90 cm   Single Plane A4C EF 59.7 %   Single Plane A2C EF 57.8 %   Calc EF 58.5 %   Area-P 1/2 2.60 cm2   Est EF 55 - 60%   Pulmonary function test     Status: None (Preliminary result)   Collection Time: 12/27/22  10:50 AM  Result Value Ref Range   FVC-Pre 1.99 L   FVC-%Pred-Pre 73 %   FEV1-Pre 1.79 L   FEV1-%Pred-Pre 87 %   FEV6-Pre 1.99 L   FEV6-%Pred-Pre 76 %   Pre FEV1/FVC ratio 90 %   FEV1FVC-%Pred-Pre 118 %   Pre FEV6/FVC Ratio 100 %   FEV6FVC-%Pred-Pre 104 %   FEF 25-75 Pre 2.80 L/sec   FEF2575-%Pred-Pre 160 %   DLCO unc 15.40 ml/min/mmHg   DLCO unc % pred 85 %   DLCO cor 15.40 ml/min/mmHg   DLCO cor % pred 85 %   DL/VA 0.45 ml/min/mmHg/L   DL/VA % pred 409 %         has a past medical history of Anemia, 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HCC), Blood transfusion without reported diagnosis, Cataract, GERD (gastroesophageal reflux disease), Primary osteoarthritis of right knee, Raynaud disease, Raynaud disease, Spinal headache, and Vasculitis (HCC).   reports that she quit smoking about 25 years ago. Her smoking use included cigarettes. She started smoking about 40 years ago. She has a 3.8 pack-year smoking history. She has been exposed to tobacco smoke. She has never used smokeless tobacco.  Past Surgical History:  Procedure Laterality Date   abdominal clip     to stomach post gastric bypass   AXILLARY LYMPH NODE BIOPSY Right 01/27/2021   Procedure: RIGHT AXILLARY LYMPH NODE BIOPSY;  Surgeon: Harriette Bouillon, MD;  Location: [facility];  Service: General;  Laterality: Right;   CESAREAN SECTION     x 2   GASTRIC BYPASS     HERNIA REPAIR  2010   inverted hermia   HIP ARTHROPLASTY     JOINT REPLACEMENT Right 2005   hip   JOINT REPLACEMENT Bilateral    knee    KNEE ARTHROPLASTY Left 09/13/2013   Procedure: LEFT COMPUTER ASSISTED TOTAL KNEE ARTHROPLASTY;  Surgeon: Eldred Manges, MD;  Location: [facility];  Service: Orthopedics;  Laterality: Left;  Left Total Knee Arthroplasty, Computer Assist   KNEE ARTHROPLASTY Right 05/23/2014   Procedure: COMPUTER ASSISTED TOTAL KNEE ARTHROPLASTY;  Surgeon: Eldred Manges, MD;  Location: [facility] OR;  Service: Orthopedics;  Laterality: Right;   LUMBAR  LAMINECTOMY/DECOMPRESSION MICRODISCECTOMY N/A 05/13/2016   Procedure: L4-5 Decompression;  Surgeon: Eldred Manges, MD;  [facility];  Service: Orthopedics;  Laterality: N/A;   RIGHT HEART CATH N/A 10/27/2021   Procedure: RIGHT HEART CATH;  Surgeon: Dolores Patty, MD;  [facility];  Service: Cardiovascular;  Laterality: N/A;   teeth etraction     TOTAL KNEE ARTHROPLASTY Right 05/22/2014  dr Ophelia Charter   TUBAL LIGATION  1981   UPPER GI ENDOSCOPY      Allergies  Allergen Reactions   Ginger Swelling and Other (See Comments)    Stomach swells   Pork-Derived Products Other (See Comments)    UNSPECIFIED "PERSONAL REASONS"   Shellfish Allergy Other (See Comments)    UNSPECIFIED "Personal reasons/dietary"    Immunization History  Administered Date(s) Administered   PFIZER Comirnaty(Gray Top)Covid-19 Tri-Sucrose Vaccine 05/21/2019, 06/11/2019, 04/05/2020, 10/13/2020   PFIZER(Purple Top)SARS-COV-2 Vaccination 05/21/2019, 06/11/2019, 04/05/2020   Zoster Recombinant(Shingrix) 03/08/2018, 05/08/2018   Zoster, Unspecified 03/08/2018, 05/08/2018    Family History  Problem Relation Age of Onset   Cancer Brother    Multiple myeloma Brother    Colon cancer Neg Hx    Esophageal cancer Neg Hx    Rectal cancer Neg Hx    Stomach cancer Neg Hx      Current Outpatient Medications:    aspirin EC 81 MG tablet, Take 81 mg by mouth daily. Swallow whole., Disp: , Rfl:    Cholecalciferol ([employer]S D3) 25 MCG (1000 UT) capsule, Take 1,000 Units by mouth daily., Disp: , Rfl:    Fe Bisgly-Succ-C-Thre-B12-FA (IRON-150 PO), Take 1 tablet by mouth daily., Disp: , Rfl:    hydroxychloroquine (PLAQUENIL) 200 MG tablet, TAKE 1 TABLET BY MOUTH EVERY DAY, Disp: 90 tablet, Rfl: 0   Multiple Vitamin (MULTIVITAMIN WITH MINERALS) TABS tablet, Take 1 tablet by mouth daily., Disp: , Rfl:    mycophenolate (CELLCEPT) 500 MG tablet, Take 500 mg by mouth 2 (two) times daily., Disp: , Rfl:    OVER THE  COUNTER MEDICATION, Take 1 capsule by mouth daily. [place], Disp: , Rfl:    psyllium (METAMUCIL) 58.6 % packet, Take 1 packet by mouth daily as needed (constipation)., Disp: , Rfl:    sildenafil (REVATIO) 20 MG tablet, Take 1 tablet (20 mg total) by mouth 3 (three) times daily. Please call to schedule follow up [phone] (Patient not taking: Reported on 12/27/2022), Disp: 90 tablet, Rfl: 0      Objective:   Vitals:   12/27/22 1357  BP: 120/72  Pulse: 82  SpO2: 99%  Weight: 172 lb 9.6 oz (78.3 kg)  Height: 5\' 2"  (1.575 m)    Estimated body mass index is 31.57 kg/m as calculated from the following:   Height as of this encounter: 5\' 2"  (1.575 m).   Weight as of this encounter: 172 lb 9.6 oz (78.3 kg).  @WEIGHTCHANGE @  [employer]   12/27/22 1357  Weight: 172 lb 9.6 oz (78.3 kg)     Physical Exam   General: No distress. Looks well O2 at rest: no Cane present: no Sitting in wheel chair: no Frail: no Obese: no Neuro: Alert and Oriented x 3. GCS 15. Speech normal Psych: Pleasant Resp:  Barrel Chest - no.  Wheeze - no, Crackles - YES BAS, No overt respiratory distress CVS: Normal heart sounds. Murmurs - n Ext: Stigmata of Connective Tissue Disease - no HEENT: Normal upper airway. PEERL +. No post nasal drip        Assessment:       ICD-10-CM   1. Interstitial lung disease due to connective tissue disease (HCC)  J84.89    M35.9     2. Raynaud's disease without gangrene  I73.00     3. 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 On Cellcept therapy  Z79.899     5. Immunosuppressed status (HCC)  D84.9  Plan:     Patient Instructions     ICD-10-CM   1. Interstitial lung disease due to connective tissue disease (HCC)  J84.89    M35.9     2. Raynaud's disease without gangrene  I73.00     3. ANA positive  R76.8     4. 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 On Cellcept therapy  Z79.899     6. Immunosuppressed status  (HCC)  D84.9     7. Vaccine counseling  Z71.85      Interstitial lung disease due to connective tissue disease (HCC) Raynaud's disease without gangrene ANA positive Myositis associated antibody positive On Cellcept therapy Immunosuppressed status (HCC)   - tolerating well cellcept 500mg  twice daily since May/June 2023 via Dr Deanne Coffer - 2nd opinion at [facility] Dr Jon Billings - Nov 2023: continue current course - Visit 12/27/2022   - PFt June 2024/Aug 2024 are slightly worse than June 2023 but still within normal range - Simple exercise test without o2 drop  Plan - continue cellcept via Dr Deanne Coffer  - monitor symptoms  -any worsening then consider adding new medications (ofev or Rituxan or increaseing cellcept)  - keep [place] posted about any respiratory infections - return in 7 weeks for close monitoring  - will do symptoms and clinical evaluation - keep [facility] appt iwht Dr Jon Billings in dec 2024  Pulmonary Hypertension  - off  revatio  Plan - per  Dr Teressa Lower  5mm Lingular Nodule March 2023 -> no change MArch 2024  Plan  - expectant followup  Anxiety   - you have marked significant anxiety score ssince dec 2023  Plan  - would it help you if we referred you to a counselor?     Followup  -7 weeks after spiromery and dlco with Dr Marchelle Gearing; 15 min visit  - symptoms score and sit/stand test at followup   FOLLOWUP Return in about 7 weeks (around 02/14/2023) for 15 min visit, with Dr Marchelle Gearing, Face to Face Visit.    SIGNATURE    Dr. Kalman Shan, M.D., F.C.C.P,  Pulmonary and Critical Care Medicine Staff Physician, [facility] Center Director - Interstitial Lung Disease  Program  Pulmonary Fibrosis [facility] at [facility] [place], [place], [address]  Pager: [phone], If no answer or between  15:00h - 7:00h: call [phone] Telephone: [phone]  2:34 PM 12/27/2022   Moderate Complexity MDM OFFICE  2021 E/M guidelines,  first released in 2021, with minor revisions added in 2023 and 2024 Must meet the requirements for 2 out of 3 dimensions to qualify.    Number and complexity of problems addressed Amount and/or complexity of data reviewed Risk of complications and/or morbidity  One or more chronic illness with mild exacerbation, OR progression, OR  side effects of treatment  Two or more stable chronic illnesses  One undiagnosed new problem with uncertain prognosis  One acute illness with systemic symptoms   One Acute complicated injury Must meet the requirements for 1 of 3 of the categories)  Category 1: Tests and documents, historian  Any combination of 3 of the following:  Assessment requiring an independent historian  Review of prior external note(s) from each unique source  Review of results of each unique test  Ordering of each unique test    Category 2: Interpretation of tests   Independent interpretation of a test performed by another physician/other qualified health care professional (not  separately reported)  Category 3: Discuss management/tests  Discussion of management or test interpretation with external physician/other qualified health care professional/appropriate source (not separately reported) Moderate risk of morbidity from additional diagnostic testing or treatment Examples only:  Prescription drug management  Decision regarding minor surgery with identfied patient or procedure risk factors  Decision regarding elective major surgery without identified patient or procedure risk factors  Diagnosis or treatment significantly limited by social determinants of health             HIGh Complexity  OFFICE   2021 E/M guidelines, first released in 2021, with minor revisions added in 2023. Must meet the requirements for 2 out of 3 dimensions to qualify.    Number and complexity of problems addressed Amount and/or complexity of data reviewed Risk of complications  and/or morbidity  Severe exacerbation of chronic illness  Acute or chronic illnesses that may pose a threat to life or bodily function, e.g., multiple trauma, acute MI, pulmonary embolus, severe respiratory distress, progressive rheumatoid arthritis, psychiatric illness with potential threat to self or others, peritonitis, acute renal failure, abrupt change in neurological status Must meet the requirements for 2 of 3 of the categories)  Category 1: Tests and documents, historian  Any combination of 3 of the following:  Assessment requiring an independent historian  Review of prior external note(s) from each unique source  Review of results of each unique test  Ordering of each unique test    Category 2: Interpretation of tests    Independent interpretation of a test performed by another physician/other qualified health care professional (not separately reported)  Category 3: Discuss management/tests  Discussion of management or test interpretation with external physician/other qualified health care professional/appropriate source (not separately reported)  HIGH risk of morbidity from additional diagnostic testing or treatment Examples only:  Drug therapy requiring intensive monitoring for toxicity  Decision for elective major surgery with identified pateint or procedure risk factors  Decision regarding hospitalization or escalation of level of care  Decision for DNR or to de-escalate care   Parenteral controlled  substances            LEGEND - Independent interpretation involves the interpretation of a test for which there is a CPT code, and an interpretation or report is customary. When a review and interpretation of a test is performed and documented by the provider, but not separately reported (billed), then this would represent an independent interpretation. This report does not need to conform to the usual standards of a complete report of the test. This does  not include interpretation of tests that do not have formal reports such as a complete blood count with differential and blood cultures. Examples would include reviewing a chest radiograph and documenting in the medical record an interpretation, but not separately reporting (billing) the interpretation of the chest radiograph.   An appropriate source includes professionals who are not health care professionals but may be involved in the management of the patient, such as a Clinical research associate, upper officer, case manager or teacher, and does not include discussion with family or informal caregivers.    - SDOH: SDOH are the conditions in the environments where people are born, live, learn, work, play, worship, and age that affect a wide range of health, functioning, and quality-of-life outcomes and risks. (e.g., housing, food insecurity, transportation, etc.). SDOH-related Z codes ranging from Z55-Z65 are the ICD-10-CM diagnosis codes used to document SDOH data Z55 - Problems related to education and literacy Z56 - Problems  related to employment and unemployment Z57 - Occupational exposure to risk factors Z58 - Problems related to physical environment Z59 - Problems related to housing and economic circumstances [phone] - Problems related to social environment [phone] - Problems related to upbringing [phone] - Other problems related to primary support group, including family circumstances Z12 - Problems related to certain psychosocial circumstances Z65 - Problems related to other psychosocial circumstances

## 2022-12-27 NOTE — Patient Instructions (Signed)
Spiro/DLCO performed today. 

## 2022-12-27 NOTE — Telephone Encounter (Signed)
Pharmacy Patient Advocate Encounter  Received notification from  [facility]  that Prior Authorization for Wegovy 0.25mg /0.57ml has been DENIED. Please advise how you'd like to proceed. Full denial letter will be uploaded to the media tab. See denial reason below.   PA #/Case ID/Reference #: [id number]

## 2022-12-30 NOTE — Telephone Encounter (Signed)
M chart message is send to pt about denial

## 2023-01-24 ENCOUNTER — Other Ambulatory Visit: Payer: Self-pay

## 2023-01-24 ENCOUNTER — Ambulatory Visit (INDEPENDENT_AMBULATORY_CARE_PROVIDER_SITE_OTHER): Payer: Medicare Other | Admitting: Orthopaedic Surgery

## 2023-01-24 DIAGNOSIS — M25511 Pain in right shoulder: Secondary | ICD-10-CM

## 2023-01-25 DIAGNOSIS — M25511 Pain in right shoulder: Secondary | ICD-10-CM

## 2023-01-25 MED ORDER — BUPIVACAINE HCL 0.25 % IJ SOLN
4.0000 mL | INTRAMUSCULAR | Status: AC | PRN
Start: 2023-01-25 — End: 2023-01-25
  Administered 2023-01-25: 4 mL via INTRA_ARTICULAR

## 2023-01-25 MED ORDER — LIDOCAINE HCL 1 % IJ SOLN
0.5000 mL | INTRAMUSCULAR | Status: AC | PRN
Start: 2023-01-25 — End: 2023-01-25
  Administered 2023-01-25: .5 mL

## 2023-01-25 MED ORDER — METHYLPREDNISOLONE ACETATE 40 MG/ML IJ SUSP
40.0000 mg | INTRAMUSCULAR | Status: AC | PRN
Start: 2023-01-25 — End: 2023-01-25
  Administered 2023-01-25: 40 mg via INTRA_ARTICULAR

## 2023-01-25 NOTE — Progress Notes (Signed)
Office Visit Note   Patient: Chelsea Callahan           Date of Birth: 1950/10/08           MRN: [id number] Visit Date: 01/24/2023              Requested by: Elenore Paddy, NP [address] [place],  [place] [address] PCP: Elenore Paddy, NP   Assessment & Plan: Visit Diagnoses:  1. Right shoulder pain, unspecified chronicity     Plan: Subacromial injection performed we discussed doing triceps without overhead position with her elbow slightly extended position using hand weight.  This should decrease his stress on her shoulder.  If she has continued problems she will call and let us know.  Follow-Up Instructions: No follow-ups on file.   Orders:  Orders Placed This Encounter  Procedures   XR Shoulder Right   No orders of the defined types were placed in this encounter.     Procedures: Large Joint Inj: R subacromial bursa on 01/25/2023 9:51 AM Indications: pain Details: 22 G 1.5 in needle, lateral approach  Arthrogram: No  Medications: 4 mL bupivacaine 0.25 %; 40 mg methylPREDNISolone acetate 40 MG/ML; 0.5 mL lidocaine 1 % Outcome: tolerated well, no immediate complications Procedure, treatment alternatives, risks and benefits explained, specific risks discussed. Consent was given by the patient. Immediately prior to procedure a time out was called to verify the correct patient, procedure, equipment, support staff and site/side marked as required. Patient was prepped and draped in the usual sterile fashion.       Clinical Data: No additional findings.   Subjective: Chief Complaint  Patient presents with   Right Shoulder - Pain    HPI 72 year old female with history of gastric bypass lumbar stenosis previous lumbar decompression, rheumatoid arthritis is seen with acute onset of right shoulder pain.  She has noticed a popping difficulty with overhead activities.  She does a workout class and they use some weights behind their head doing triceps exercises.  She  denies numbness or tingling in her fingers no associated neck pain.  Pain radiates near the deltoid insertion site and she has some tenderness adjacent to the posterior shoulder capsule.  Review of Systems all systems noncontributory and updated.   Objective: Vital Signs: There were no vitals taken for this visit.  Physical Exam Constitutional:      Appearance: She is well-developed.  HENT:     Head: Normocephalic.     Right Ear: External ear normal.     Left Ear: External ear normal. There is no impacted cerumen.  Eyes:     Pupils: Pupils are equal, round, and reactive to light.  Neck:     Thyroid: No thyromegaly.     Trachea: No tracheal deviation.  Cardiovascular:     Rate and Rhythm: Normal rate.  Pulmonary:     Effort: Pulmonary effort is normal.  Abdominal:     Palpations: Abdomen is soft.  Musculoskeletal:     Cervical back: No rigidity.  Skin:    General: Skin is warm and dry.  Neurological:     Mental Status: She is alert and oriented to person, place, and time.  Psychiatric:        Behavior: Behavior normal.     Ortho Exam well-healed knee arthroscopy incisions.  Positive impingement right shoulder long head of the biceps is normal negative drop arm test.  No subluxation.  Mild tenderness to supraspinatus fossa without atrophy.  Specialty Comments:  No specialty comments available.  Imaging: No results found.   PMFS History: Patient Active Problem List   Diagnosis Date Noted   Lower leg mass, right 09/16/2022   Pulmonary hypertension (HCC) 09/16/2022   Class 1 obesity with serious comorbidity and body mass index (BMI) of 32.0 to 32.9 in adult 09/16/2022   Hyperlipidemia 09/16/2022   Chronic cough 05/19/2022   Estrogen deficiency 05/19/2022   Osteopenia 01/28/2022   Raynaud phenomenon 10/22/2021   Connective tissue disease (HCC) 10/08/2021   Onychodystrophy 09/24/2021   Axillary mass, right 09/24/2021   Anxiety 09/24/2021   Anemia 08/13/2021    Chronic pain of both knees 12/27/2017   Pain in left hand 12/27/2017   History of lumbar laminectomy for spinal cord decompression 12/27/2017   Lumbar stenosis 05/13/2016   High risk medication use 05/11/2016   Primary osteoarthritis of both feet 05/11/2016   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   Low back pain 03/09/2016   Spinal stenosis of lumbar region 03/09/2016   Osteoarthritis of right knee 05/27/2014   Autoimmune disease (HCC) 09/24/2013   Rheumatoid arthritis (HCC) 09/24/2013   Osteoarthritis of left knee 09/13/2013    Class: Diagnosis of   Past Medical History:  Diagnosis Date   Anemia    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HCC)    Blood transfusion without reported diagnosis    Cataract    bil/ per pt   GERD (gastroesophageal reflux disease)    Primary osteoarthritis of right knee    Raynaud disease    Raynaud disease    Spinal headache    c-section   Vasculitis (HCC)    on prednisone -     Family History  Problem Relation Age of Onset   Cancer Brother    Multiple myeloma Brother    Colon cancer Neg Hx    Esophageal cancer Neg Hx    Rectal cancer Neg Hx    Stomach cancer Neg Hx     Past Surgical History:  Procedure Laterality Date   abdominal clip     to stomach post gastric bypass   AXILLARY LYMPH NODE BIOPSY Right 01/27/2021   Procedure: RIGHT AXILLARY LYMPH NODE BIOPSY;  Surgeon: Harriette Bouillon, MD;  Location: [facility];  Service: General;  Laterality: Right;   CESAREAN SECTION     x 2   GASTRIC BYPASS     HERNIA REPAIR  2010   inverted hermia   HIP ARTHROPLASTY     JOINT REPLACEMENT Right 2005   hip   JOINT REPLACEMENT Bilateral    knee    KNEE ARTHROPLASTY Left 09/13/2013   Procedure: LEFT COMPUTER ASSISTED TOTAL KNEE ARTHROPLASTY;  Surgeon: Eldred Manges, MD;  Location: [facility] OR;  Service: Orthopedics;  Laterality: Left;  Left Total Knee Arthroplasty, Computer Assist   KNEE ARTHROPLASTY Right  05/23/2014   Procedure: COMPUTER ASSISTED TOTAL KNEE ARTHROPLASTY;  Surgeon: Eldred Manges, MD;  Location: [facility] OR;  Service: Orthopedics;  Laterality: Right;   LUMBAR LAMINECTOMY/DECOMPRESSION MICRODISCECTOMY N/A 05/13/2016   Procedure: L4-5 Decompression;  Surgeon: Eldred Manges, MD;  Location[facility];  Service: Orthopedics;  Laterality: N/A;   RIGHT HEART CATH N/A 10/27/2021   Procedure: RIGHT HEART CATH;  Surgeon: Dolores Patty, MD;  Location: [facility];  Service: Cardiovascular;  Laterality: N/A;   teeth etraction     TOTAL KNEE ARTHROPLASTY Right 05/22/2014   dr Ophelia Charter   TUBAL LIGATION  1981  UPPER GI ENDOSCOPY     Social History   Occupational History   Not on file  Tobacco Use   Smoking status: Former    Current packs/day: 0.00    Average packs/day: 0.3 packs/day for 15.0 years (3.8 ttl pk-yrs)    Types: Cigarettes    Start date: 65    Quit date: 1999    Years since quitting: 25.7    Passive exposure: Past   Smokeless tobacco: Never  Vaping Use   Vaping status: Never Used  Substance and Sexual Activity   Alcohol use: Yes    Alcohol/week: 4.0 - 6.0 standard drinks of alcohol    Types: 4 - 6 Glasses of wine per week    Comment: weekly    Drug use: No   Sexual activity: Not on file

## 2023-02-02 DIAGNOSIS — J849 Interstitial pulmonary disease, unspecified: Secondary | ICD-10-CM | POA: Diagnosis not present

## 2023-02-02 DIAGNOSIS — Z79899 Other long term (current) drug therapy: Secondary | ICD-10-CM | POA: Diagnosis not present

## 2023-02-02 DIAGNOSIS — I7300 Raynaud's syndrome without gangrene: Secondary | ICD-10-CM | POA: Diagnosis not present

## 2023-02-02 DIAGNOSIS — M1990 Unspecified osteoarthritis, unspecified site: Secondary | ICD-10-CM | POA: Diagnosis not present

## 2023-02-02 DIAGNOSIS — M359 Systemic involvement of connective tissue, unspecified: Secondary | ICD-10-CM | POA: Diagnosis not present

## 2023-02-02 DIAGNOSIS — R768 Other specified abnormal immunological findings in serum: Secondary | ICD-10-CM | POA: Diagnosis not present

## 2023-02-06 NOTE — Progress Notes (Signed)
ADVANCED HF CLINIC NOTE  Referring Physician: Dr. Pollyann Savoy- Primary Care: Elenore Paddy, NP Primary HF Cardiologist: New Rheumatologist: Dr Aryl   HPI: Chelsea Callahan is a 72 y.o. obese female with history of autoimmune disease (positive ANA 1:1,280), ILD and osteoarthritis. She has intermittent symptoms of raynaud's but denies any digital ulcerations.   Had hi-res CT chest 3/23 suggestive of ILD with enlarged pulmonary arteries. Esophagus normal   Was a "light" smoker (6-7 cigs/day) for 15 years quit 1999. No h/o DVT or PE. No diet drugs .  Home sleep study- negative.   Remains on cellcept  RHC 6/23  RA = 8 RV = 45/11 PA = 45/13 (28) PCW = 17 Fick cardiac output/index = 5.2/2.9 PVR = 2.1 FA sat = 97% PA sat = 69%, 70% SVC sat = 66%  Today she returns for HF follow up. Feels good. Walks 1-2 miles every day.SOB when she starts then warms up. Also doing exercise classes. No edema, orthopnea or PND. No dizziness. Ran out of sildenafil 1 month ago. Cannot tell a difference.     Past Medical History:  Diagnosis Date   Anemia    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HCC)    Blood transfusion without reported diagnosis    Cataract    bil/ per pt   GERD (gastroesophageal reflux disease)    Primary osteoarthritis of right knee    Raynaud disease    Raynaud disease    Spinal headache    c-section   Vasculitis (HCC)    on prednisone -     Current Outpatient Medications  Medication Sig Dispense Refill   aspirin EC 81 MG tablet Take 81 mg by mouth daily. Swallow whole.     Cholecalciferol ([employer] D3) 25 MCG (1000 UT) capsule Take 1,000 Units by mouth daily.     Fe Bisgly-Succ-C-Thre-B12-FA (IRON-150 PO) Take 1 tablet by mouth daily.     hydroxychloroquine (PLAQUENIL) 200 MG tablet TAKE 1 TABLET BY MOUTH EVERY DAY 90 tablet 0   Multiple Vitamin (MULTIVITAMIN WITH MINERALS) TABS tablet Take 1 tablet by mouth daily.     mycophenolate (CELLCEPT) 500 MG tablet Take 500 mg by mouth 2  (two) times daily.     OVER THE COUNTER MEDICATION Take 1 capsule by mouth daily. Sea Moss     psyllium (METAMUCIL) 58.6 % packet Take 1 packet by mouth daily as needed (constipation).     sildenafil (REVATIO) 20 MG tablet Take 1 tablet (20 mg total) by mouth 3 (three) times daily. Please call to schedule follow up [phone] (Patient not taking: Reported on 12/27/2022) 90 tablet 0   No current facility-administered medications for this visit.    Allergies  Allergen Reactions   Ginger Swelling and Other (See Comments)    Stomach swells   Pork-Derived Products Other (See Comments)    UNSPECIFIED "PERSONAL REASONS"   Shellfish Allergy Other (See Comments)    UNSPECIFIED "Personal reasons/dietary"      Social History   Socioeconomic History   Marital status: Married    Spouse name: Not on file   Number of children: Not on file   Years of education: Not on file   Highest education level: Not on file  Occupational History   Not on file  Tobacco Use   Smoking status: Former    Current packs/day: 0.00    Average packs/day: 0.3 packs/day for 15.0 years (3.8 ttl pk-yrs)    Types: Cigarettes    Start date: 75  Quit date: 40    Years since quitting: 25.7    Passive exposure: Past   Smokeless tobacco: Never  Vaping Use   Vaping status: Never Used  Substance and Sexual Activity   Alcohol use: Yes    Alcohol/week: 4.0 - 6.0 standard drinks of alcohol    Types: 4 - 6 Glasses of wine per week    Comment: weekly    Drug use: No   Sexual activity: Not on file  Other Topics Concern   Not on file  Social History Narrative   Not on file   Social Determinants of Health   Financial Resource Strain: Not on file  Food Insecurity: Not on file  Transportation Needs: Not on file  Physical Activity: Not on file  Stress: Not on file  Social Connections: Not on file  Intimate Partner Violence: Not on file      Family History  Problem Relation Age of Onset   Cancer Brother     Multiple myeloma Brother    Colon cancer Neg Hx    Esophageal cancer Neg Hx    Rectal cancer Neg Hx    Stomach cancer Neg Hx     There were no vitals filed for this visit.   Wt Readings from Last 3 Encounters:  12/27/22 78.3 kg (172 lb 9.6 oz)  10/28/22 76.2 kg (168 lb)  10/25/22 77.1 kg (170 lb)     PHYSICAL EXAM: General:  Well appearing. No resp difficulty HEENT: normal Neck: supple. no JVD. Carotids 2+ bilat; no bruits. No lymphadenopathy or thryomegaly appreciated. Cor: PMI nondisplaced. Regular rate & rhythm. No rubs, gallops or murmurs. Lungs: clear Abdomen: soft, nontender, nondistended. No hepatosplenomegaly. No bruits or masses. Good bowel sounds. Extremities: no cyanosis, clubbing, rash, edema Neuro: alert & orientedx3, cranial nerves grossly intact. moves all 4 extremities w/o difficulty. Affect pleasant  ASSESSMENT & PLAN:   1. 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 RV normal. RVSP 40.  - RHC 6/23 RA 8 PA 45/13 (28) PCW  17 Fick 5.2/2.9 PVR  2.1 - Sleep study negative for OSA - PFTs 6/24 FEV1 1.84 (80%) FVC 1.92 (70%) DLCO 70% - Has been off sildenafil x 1 month without any change in symptoms - NYHA I-II - Will repeat echo. If stable will continue to follow. If RVSP going up or evidence of RV strain will need repeat RHC  2. ILD - on cellcept.  - hi-res CT 3/24 c/w ILD - has f/u with Dr. Marchelle Gearing  3. Morbid obesity - There is no height or weight on file to calculate BMI. - On [id number]  Jacklynn Ganong, FNP  5:15 PM

## 2023-02-07 ENCOUNTER — Ambulatory Visit (HOSPITAL_COMMUNITY)
Admission: RE | Admit: 2023-02-07 | Discharge: 2023-02-07 | Disposition: A | Discharge status: Home / Self Care | Payer: Medicare Other | Source: Ambulatory Visit | Attending: Family Medicine | Admitting: Family Medicine

## 2023-02-07 ENCOUNTER — Encounter (HOSPITAL_COMMUNITY): Payer: Self-pay

## 2023-02-07 VITALS — BP 164/80 | HR 70 | Wt 176.4 lb

## 2023-02-07 DIAGNOSIS — M25473 Effusion, unspecified ankle: Secondary | ICD-10-CM | POA: Insufficient documentation

## 2023-02-07 DIAGNOSIS — M359 Systemic involvement of connective tissue, unspecified: Secondary | ICD-10-CM | POA: Diagnosis not present

## 2023-02-07 DIAGNOSIS — R519 Headache, unspecified: Secondary | ICD-10-CM | POA: Diagnosis not present

## 2023-02-07 DIAGNOSIS — Z79624 Long term (current) use of inhibitors of nucleotide synthesis: Secondary | ICD-10-CM | POA: Diagnosis not present

## 2023-02-07 DIAGNOSIS — I272 Pulmonary hypertension, unspecified: Secondary | ICD-10-CM | POA: Diagnosis not present

## 2023-02-07 DIAGNOSIS — J849 Interstitial pulmonary disease, unspecified: Secondary | ICD-10-CM

## 2023-02-07 DIAGNOSIS — E669 Obesity, unspecified: Secondary | ICD-10-CM | POA: Diagnosis not present

## 2023-02-07 DIAGNOSIS — D8989 Other specified disorders involving the immune mechanism, not elsewhere classified: Secondary | ICD-10-CM | POA: Diagnosis not present

## 2023-02-07 DIAGNOSIS — Z6832 Body mass index (BMI) 32.0-32.9, adult: Secondary | ICD-10-CM | POA: Diagnosis not present

## 2023-02-07 DIAGNOSIS — I2721 Secondary pulmonary arterial hypertension: Secondary | ICD-10-CM | POA: Diagnosis not present

## 2023-02-07 NOTE — Patient Instructions (Addendum)
Thank you for coming in today  If you had labs drawn today, any labs that are abnormal the clinic will call you No news is good news  Medications: No change  Follow up appointments:  Your physician recommends that you schedule a follow-up appointment in:  6 months With Dr. Gala Romney You will receive a reminder letter in the mail a few months in advance. If you don't receive a letter, please call our office to schedule the follow-up appointment.    Do the following things EVERYDAY: Weigh yourself in the morning before breakfast. Write it down and keep it in a log. Take your medicines as prescribed Eat low salt foods--Limit salt (sodium) to 2000 mg per day.  Stay as active as you can everyday Limit all fluids for the day to less than 2 liters   At the [facility], you and your health needs are our priority. As part of our continuing mission to provide you with exceptional heart care, we have created designated Provider Care Teams. These Care Teams include your primary Cardiologist (physician) and Advanced Practice Providers (APPs- Physician Assistants and Nurse Practitioners) who all work together to provide you with the care you need, when you need it.   You may see any of the following providers on your designated Care Team at your next follow up: Dr Arvilla Meres Dr Marca Ancona Dr. Marcos Eke, NP Robbie Lis, [place] [facility] [place], [place] Brynda Peon, NP Karle Plumber, PharmD   Please be sure to bring in all your medications bottles to every appointment.    Thank you for choosing [facility]  If you have any questions or concerns before your next appointment please send [place] a message through [place] or call our office at [phone].    TO LEAVE A MESSAGE FOR THE NURSE SELECT OPTION 2, PLEASE LEAVE A MESSAGE INCLUDING: YOUR NAME DATE OF BIRTH CALL BACK NUMBER REASON FOR  CALL**this is important as we prioritize the call backs  YOU WILL RECEIVE A CALL BACK THE SAME DAY AS LONG AS YOU CALL BEFORE 4:00 PM

## 2023-02-10 ENCOUNTER — Encounter: Payer: Self-pay | Admitting: Internal Medicine

## 2023-02-10 ENCOUNTER — Ambulatory Visit (INDEPENDENT_AMBULATORY_CARE_PROVIDER_SITE_OTHER): Payer: Medicare Other | Admitting: Internal Medicine

## 2023-02-10 VITALS — BP 123/71 | HR 65 | Ht 62.0 in | Wt 177.6 lb

## 2023-02-10 DIAGNOSIS — M359 Systemic involvement of connective tissue, unspecified: Secondary | ICD-10-CM

## 2023-02-10 DIAGNOSIS — J8489 Other specified interstitial pulmonary diseases: Secondary | ICD-10-CM | POA: Diagnosis not present

## 2023-02-10 NOTE — Progress Notes (Signed)
to see if we can get her spirometry and DLCO done either at our office or in the hospital within the next few weeks.  Patient is okay with this plan.  If this is not possible I will have to look for alternate places where the pulmonary function test can be done.         CT Chest data from date: 07/05/22  - personally visualized and independently interpreted : NO - my findings are: none other than see offical report   IMPRESSION: 1. Today's examination is generally somewhat limited by breath motion artifact within this limitation, no obvious interval change in  mild pulmonary fibrosis in a pattern with apical to basal gradient, featuring irregular peripheral interstitial opacity, septal thickening, and small areas of subpleural bronchiolectasis at the lung bases without clear evidence of honeycombing. Findings are categorized as probable UIP per consensus guidelines: Diagnosis of Idiopathic Pulmonary Fibrosis: An Official ATS/ERS/JRS/ALAT Clinical Practice Guideline. Am Rosezetta Schlatter Crit Care Med Vol 198, Iss 5, [phone], Dec 31 2016. 2. Unchanged 0.5 cm nodule of the posterior lingula, benign, for which no further follow-up or characterization is required. 3. Cardiomegaly and coronary artery disease.   Aortic Atherosclerosis (ICD10-I70.0).     Electronically Signed   By: Jearld Lesch M.D.   On: 07/06/2022 15:51  OV 12/27/2022  Subjective:  Patient ID: Chelsea Callahan, female , DOB: 1950/10/10 , age 72 y.o. , MRN: [id number] , ADDRESS: [address] [place] [address] PCP Elenore Paddy, NP Patient Care Team: Elenore Paddy, NP as PCP - General (Nurse Practitioner)  This Provider for this visit: Treatment Team:  Attending Provider: Kalman Shan, MD    12/27/2022 -   Chief Complaint  Patient presents with   Follow-up    F/up on PFT   +.   HPI Chelsea Callahan 72 y.o. -+ returns for follow-up after her video visit last week.  Actually 4 days ago.  She has had PFTs.  Pulmonary function test continues to be normal.  It is very similar to June 2024.  Although normal it is a slight decline compared to 1 year ago.  1 year ago it was supranormal.  Her symptom scores appears to be range bound.  She continues to have significant amount of anxiety.  She has upcoming visit at [facility] April 11, 2023 with Dr. Marcelene Butte ILD clinic.  We discussed about just following with him in December.  With him he took a shared decision making to see her at the midpoint just a clinical visit to make sure the ILD is stable.   Therefore see her back in 7 weeks.  Noticed her anxiety scores are positive hence increase since late last year.  Her last the CMA to ask her about her interest in seeing a counselor.  I noticed that she is not on any SNRI or SSRI or anxiolytics.    OV 02/17/2023  Subjective:  Patient ID: Chelsea Callahan, female , DOB: 1950-08-28 , age 72 y.o. , MRN: [id number] , ADDRESS: [address] [place] [address] PCP Elenore Paddy, NP Patient Care Team: Elenore Paddy, NP as PCP - General (Nurse Practitioner)  This Provider for this visit: Treatment Team:  Attending Provider: Kalman Shan, MD    02/17/2023 -   Chief Complaint  Patient presents with   Follow-up    Pt denies any concerns, still on cellcept     #Undifferentiated connective tissue disease  -Myositis antibody negative's 2023 -  OV 08/26/2021 - new ILD [facility] ti ILD center. Referred by Dr Pollyann Savoy  Subjective:  Patient ID: Chelsea Callahan, female , DOB: 1951/04/19 , age 72 y.o. , MRN: [id number] , ADDRESS: [address] [place] [address] PCP Elenore Paddy, NP Patient Care Team: Elenore Paddy, NP as PCP - General (Nurse Practitioner)  This Provider for this visit: Treatment Team:  Attending Provider: Kalman Shan, MD    08/26/2021 -   Chief Complaint  Patient presents with   Consult    Referred for possible ILD     HPI Chelsea Callahan 72 y.o. -referred by Dr Naida Sleight.  72 year old female.  She used to live in [place]  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  Then in 2013 she relocated to [facility] [place] [facility] along with her husband.  This is for her retirement.  She tells me that approximately 13 years ago she developed Raynaud's symptoms.  She also started having some alopecia may be a year or 2 before that.  This then resulted in a positive ANA testing.  She then started following with a rheumatologist for 5 years before she relocated to [facility] and for the last 8 or 9 years has established care with Dr. Corliss Skains.  Her ANA here is also positive [see below].  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  She is followed periodically.  She is on Plaquenil.  She has arthritis but this is osteoarthritis.  She denies any scleroderma findings or rheumatoid arthritis findings of malar rash or oral ulcers or hematuria or other connective tissue disease findings beyond the Raynaud's.  During the course of routine follow-up this year she was discovered to have crackles.  The notes also confirmed this.  This resulted in a high-resolution CT chest that shows interstitial lung disease predominantly with a craniocaudal gradient.  Reticulation and some traction  bronchiectasis but no honeycombing.  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  Therefore she has been referred here.  She tells me that she prefers to be very aggressive with the treatment and management approach.  She has a life goal of living up to 90 years.  Currently she is able to walk 2 miles every 2 weeks.  She never stops.   She does not feel much short of breath.  Initially she has some chest tightness and this resolves.  Is not much of a cough.  Past medical history - Negative for any specific connective tissue disease.  Negative for hiatal hernia and acid reflux. - Positive for ANA + and low titer rheumatoid factor [discussion with Dr. Algis Downs today:: Undifferentiated connective tissue disease:] - She has had the COVID disease in January 2022.  She also had COVID-vaccine 4 shots.  Review of systems - Raynaud's present - Osteoarthritis present  Family history of pulmonary disease - Brother has myasthenia gravis but otherwise negative  Exposure history - Smoked from [age] although in between she stopped between 1979 and 1983.  3 to 5 cigarettes/day.  She did some marijuana as a teenager.  Otherwise no cocaine use or no intravenous drug use.  Home and Hobby details - [place]  She has lived there for 2 years.  Is a private home.  Detail organic antigen exposure history in this house is negative.  She  Patient presents with   Follow-up    Ct f/u      HPI Anesia Huther 72 y.o. -returns for follow-up.  She continues to be immunosuppressed.  She takes CellCept regularly.  She feels stable minimal symptoms she walks 2 miles per day.  Her husband is here with her.  She is very anxious about his CT scan results which shows continued stability of her ILD.  In the same context her symptom score is unchanged and a walking desaturation test is stable.  Her last pulmonary function test was in October 2023.  She is really relieved about the result.  Of note she has a 5 mm lingular nodule and this is also stable.  She says she has upcoming dental surgery.  She has a dental implant that 64 or 72 years old.  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  She is being recommended extraction and then replacement at least partially.  She is nervous about this but she has noticed pus intermittently coming from her gums.  This past predates her starting CellCept.  I have indicated to her to hold the CellCept before and after the surgery to be extra cautious.  Did discuss the risk would be progressive ILD but the risk would be low given current stability.    OV 12/23/2022  Subjective:  Patient ID: Chelsea Callahan, female , DOB: Sep 16, 1950 , age 64 y.o. , MRN: [id number] , ADDRESS: [address] [place] [address] PCP Elenore Paddy, NP Patient Care Team: Elenore Paddy, NP as PCP - General (Nurse Practitioner)  This Provider for this visit: Treatment Team:  Attending Provider: Kalman Shan, MD    12/23/2022 -   Chief Complaint  Patient presents with   Follow-up    F/up on PFT,    Type of visit: Video Virtual Visit Identification of patient Mi Chap with [birth year] and MRN [id number] - 2 person identifier Risks: Risks, benefits, limitations of telephone visit explained. Patient understood and verbalized agreement to proceed Anyone else on call:  no Patient location: her home This provider location: [address]; [place]; [place] [address]. [facility]. [phone]    HPI Jaretssi Brenneke 72 y.o. -returns for follow-up.  This is a video visit.  Last pulmonary function test was in June 2024 and showed a 10% decline in FVC.  She tells me overall for her activities of daily living she is stable but sometimes she feels she might be more short of breath.  Otherwise no Interim Health status: No new complaints No new medical problems. No new surgeries. No ER visits. No Urgent care visits.   Her upcoming appointment at [facility] ILD clinic is in the December 2024.  Of note she did see Dr. Gala Romney and she had an echocardiogram in June 2024 she had grade 2 grade 2 diastolic dysfunction.  Because of lack of subjective improvement he stopped of audio.  We discussed the most recent pulmonary function test and the decline.  She was about to get another pulmonary function test I had of this visit but this was not scheduled because of the backlog in our office.  She is very upset.  She feels that she is being victimized because of our office inefficiencies.  I initially discussed that it could be a 47-month wait for pulmonary function test.  She does not want to wait that long.  She feels she has a terminal disease.  Every chart to our office scheduling  scheduled for March 03, 2022 which is 3 months since the pulmonary function test.  Also at this time she is now on CellCept.  We took a shared decision making that she will have 1 more pulmonary function test it was the end of October 2023 to give a sequential timeline for Dr. Jon Billings to help with his decision making.  We discussed the fact that nintedanib would be the next agent and whether we will start it now or wait till if any progression.  This will be a question that would be post to Dr. Jon Billings.  The pulmonary function test data should help.  We discussed her respiratory vaccines but currently she wants to hold off.  Of note she has a 5 mm lingular nodule in March 2023.  She is a remote smoker.  We will get a repeat CT scan of the chest in spring or summer 2024.    PFT  OV 04/12/2022  Subjective:  Patient ID: Chelsea Callahan, female , DOB: Mar 06, 1951 , age 51 y.o. , MRN: [id number] , ADDRESS: [address] [place] [address] PCP Elenore Paddy, NP Patient Care Team: Elenore Paddy, NP as PCP - General (Nurse Practitioner)  This Provider for this visit: Treatment Team:  Attending Provider: Kalman Shan, MD    04/12/2022 -   Chief Complaint  Patient presents with   Follow-up    Pt  states she has been doing okay since last visit. States she has had an infection in her mouth that she states her dentist does not know how to treat.     HPI Kaleesi Silvan 72 y.o. -returns for follow-up.  She continues on CellCept 100 mg twice daily.  After seeing me last time she had pulmonary function test that shows stability [see below].  She then saw Dr. Marcelene Butte.  He advised continuing current course with CellCept 5 mg twice daily and then consider add-on therapy.  They discussed biopsy.  But currently that was being withheld.  He also felt it was too early for lung transplant.  He did weigh in on adding a second drug of going up on CellCept but in balance they took a shared decision making to continue current course.  Currently she is content continuing the current course.  She has seen Dr. Deanne Coffer in rheumatology recently and had blood work for her CellCept and she said it is normal.  We again discussed vaccines but she declined.  I did emphasize the risk that comes with respiratory infections in the setting of immunosuppression pulmonary fibrosis but she prefers to defer vaccination.  I then advised her to stay away from human clusters especially during the fall and winter and avoid sick contacts.  She verbalized understanding.  We discussed continued serial monitoring.  She is most accepting of this plan.  She will continue her CellCept at the current dose.  We discussed about ways to monitor her symptoms and if there is any change in her shortness of breath between now and the next visit she will let us know.     OV 07/07/2022  Subjective:  Patient ID: Chelsea Callahan, female , DOB: December 11, 1950 , age 59 y.o. , MRN: [id number] , ADDRESS: [address] [place] [address] PCP Elenore Paddy, NP Patient Care Team: Elenore Paddy, NP as PCP - General (Nurse Practitioner)  This Provider for this visit: Treatment Team:  Attending Provider: Kalman Shan, MD    07/07/2022 -   Chief  Complaint  scheduled for March 03, 2022 which is 3 months since the pulmonary function test.  Also at this time she is now on CellCept.  We took a shared decision making that she will have 1 more pulmonary function test it was the end of October 2023 to give a sequential timeline for Dr. Jon Billings to help with his decision making.  We discussed the fact that nintedanib would be the next agent and whether we will start it now or wait till if any progression.  This will be a question that would be post to Dr. Jon Billings.  The pulmonary function test data should help.  We discussed her respiratory vaccines but currently she wants to hold off.  Of note she has a 5 mm lingular nodule in March 2023.  She is a remote smoker.  We will get a repeat CT scan of the chest in spring or summer 2024.    PFT  OV 04/12/2022  Subjective:  Patient ID: Chelsea Callahan, female , DOB: Mar 06, 1951 , age 51 y.o. , MRN: [id number] , ADDRESS: [address] [place] [address] PCP Elenore Paddy, NP Patient Care Team: Elenore Paddy, NP as PCP - General (Nurse Practitioner)  This Provider for this visit: Treatment Team:  Attending Provider: Kalman Shan, MD    04/12/2022 -   Chief Complaint  Patient presents with   Follow-up    Pt  states she has been doing okay since last visit. States she has had an infection in her mouth that she states her dentist does not know how to treat.     HPI Kaleesi Silvan 72 y.o. -returns for follow-up.  She continues on CellCept 100 mg twice daily.  After seeing me last time she had pulmonary function test that shows stability [see below].  She then saw Dr. Marcelene Butte.  He advised continuing current course with CellCept 5 mg twice daily and then consider add-on therapy.  They discussed biopsy.  But currently that was being withheld.  He also felt it was too early for lung transplant.  He did weigh in on adding a second drug of going up on CellCept but in balance they took a shared decision making to continue current course.  Currently she is content continuing the current course.  She has seen Dr. Deanne Coffer in rheumatology recently and had blood work for her CellCept and she said it is normal.  We again discussed vaccines but she declined.  I did emphasize the risk that comes with respiratory infections in the setting of immunosuppression pulmonary fibrosis but she prefers to defer vaccination.  I then advised her to stay away from human clusters especially during the fall and winter and avoid sick contacts.  She verbalized understanding.  We discussed continued serial monitoring.  She is most accepting of this plan.  She will continue her CellCept at the current dose.  We discussed about ways to monitor her symptoms and if there is any change in her shortness of breath between now and the next visit she will let us know.     OV 07/07/2022  Subjective:  Patient ID: Chelsea Callahan, female , DOB: December 11, 1950 , age 59 y.o. , MRN: [id number] , ADDRESS: [address] [place] [address] PCP Elenore Paddy, NP Patient Care Team: Elenore Paddy, NP as PCP - General (Nurse Practitioner)  This Provider for this visit: Treatment Team:  Attending Provider: Kalman Shan, MD    07/07/2022 -   Chief  Complaint  scheduled for March 03, 2022 which is 3 months since the pulmonary function test.  Also at this time she is now on CellCept.  We took a shared decision making that she will have 1 more pulmonary function test it was the end of October 2023 to give a sequential timeline for Dr. Jon Billings to help with his decision making.  We discussed the fact that nintedanib would be the next agent and whether we will start it now or wait till if any progression.  This will be a question that would be post to Dr. Jon Billings.  The pulmonary function test data should help.  We discussed her respiratory vaccines but currently she wants to hold off.  Of note she has a 5 mm lingular nodule in March 2023.  She is a remote smoker.  We will get a repeat CT scan of the chest in spring or summer 2024.    PFT  OV 04/12/2022  Subjective:  Patient ID: Chelsea Callahan, female , DOB: Mar 06, 1951 , age 51 y.o. , MRN: [id number] , ADDRESS: [address] [place] [address] PCP Elenore Paddy, NP Patient Care Team: Elenore Paddy, NP as PCP - General (Nurse Practitioner)  This Provider for this visit: Treatment Team:  Attending Provider: Kalman Shan, MD    04/12/2022 -   Chief Complaint  Patient presents with   Follow-up    Pt  states she has been doing okay since last visit. States she has had an infection in her mouth that she states her dentist does not know how to treat.     HPI Kaleesi Silvan 72 y.o. -returns for follow-up.  She continues on CellCept 100 mg twice daily.  After seeing me last time she had pulmonary function test that shows stability [see below].  She then saw Dr. Marcelene Butte.  He advised continuing current course with CellCept 5 mg twice daily and then consider add-on therapy.  They discussed biopsy.  But currently that was being withheld.  He also felt it was too early for lung transplant.  He did weigh in on adding a second drug of going up on CellCept but in balance they took a shared decision making to continue current course.  Currently she is content continuing the current course.  She has seen Dr. Deanne Coffer in rheumatology recently and had blood work for her CellCept and she said it is normal.  We again discussed vaccines but she declined.  I did emphasize the risk that comes with respiratory infections in the setting of immunosuppression pulmonary fibrosis but she prefers to defer vaccination.  I then advised her to stay away from human clusters especially during the fall and winter and avoid sick contacts.  She verbalized understanding.  We discussed continued serial monitoring.  She is most accepting of this plan.  She will continue her CellCept at the current dose.  We discussed about ways to monitor her symptoms and if there is any change in her shortness of breath between now and the next visit she will let us know.     OV 07/07/2022  Subjective:  Patient ID: Chelsea Callahan, female , DOB: December 11, 1950 , age 59 y.o. , MRN: [id number] , ADDRESS: [address] [place] [address] PCP Elenore Paddy, NP Patient Care Team: Elenore Paddy, NP as PCP - General (Nurse Practitioner)  This Provider for this visit: Treatment Team:  Attending Provider: Kalman Shan, MD    07/07/2022 -   Chief  Complaint  Patient presents with   Follow-up    Ct f/u      HPI Anesia Huther 72 y.o. -returns for follow-up.  She continues to be immunosuppressed.  She takes CellCept regularly.  She feels stable minimal symptoms she walks 2 miles per day.  Her husband is here with her.  She is very anxious about his CT scan results which shows continued stability of her ILD.  In the same context her symptom score is unchanged and a walking desaturation test is stable.  Her last pulmonary function test was in October 2023.  She is really relieved about the result.  Of note she has a 5 mm lingular nodule and this is also stable.  She says she has upcoming dental surgery.  She has a dental implant that 64 or 71 years old.  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  She is being recommended extraction and then replacement at least partially.  She is nervous about this but she has noticed pus intermittently coming from her gums.  This past predates her starting CellCept.  I have indicated to her to hold the CellCept before and after the surgery to be extra cautious.  Did discuss the risk would be progressive ILD but the risk would be low given current stability.    OV 12/23/2022  Subjective:  Patient ID: Chelsea Callahan, female , DOB: Sep 16, 1950 , age 64 y.o. , MRN: [id number] , ADDRESS: [address] [place] [address] PCP Elenore Paddy, NP Patient Care Team: Elenore Paddy, NP as PCP - General (Nurse Practitioner)  This Provider for this visit: Treatment Team:  Attending Provider: Kalman Shan, MD    12/23/2022 -   Chief Complaint  Patient presents with   Follow-up    F/up on PFT,    Type of visit: Video Virtual Visit Identification of patient Mi Chap with [birth year] and MRN [id number] - 2 person identifier Risks: Risks, benefits, limitations of telephone visit explained. Patient understood and verbalized agreement to proceed Anyone else on call:  no Patient location: her home This provider location: [address], [address]; [place]; [place] [address]. [facility]. [phone]    HPI Jaretssi Brenneke 72 y.o. -returns for follow-up.  This is a video visit.  Last pulmonary function test was in June 2024 and showed a 10% decline in FVC.  She tells me overall for her activities of daily living she is stable but sometimes she feels she might be more short of breath.  Otherwise no Interim Health status: No new complaints No new medical problems. No new surgeries. No ER visits. No Urgent care visits.   Her upcoming appointment at [facility] ILD clinic is in the December 2024.  Of note she did see Dr. Gala Romney and she had an echocardiogram in June 2024 she had grade 2 grade 2 diastolic dysfunction.  Because of lack of subjective improvement he stopped of audio.  We discussed the most recent pulmonary function test and the decline.  She was about to get another pulmonary function test I had of this visit but this was not scheduled because of the backlog in our office.  She is very upset.  She feels that she is being victimized because of our office inefficiencies.  I initially discussed that it could be a 47-month wait for pulmonary function test.  She does not want to wait that long.  She feels she has a terminal disease.  Every chart to our office scheduling  Patient presents with   Follow-up    Ct f/u      HPI Anesia Huther 72 y.o. -returns for follow-up.  She continues to be immunosuppressed.  She takes CellCept regularly.  She feels stable minimal symptoms she walks 2 miles per day.  Her husband is here with her.  She is very anxious about his CT scan results which shows continued stability of her ILD.  In the same context her symptom score is unchanged and a walking desaturation test is stable.  Her last pulmonary function test was in October 2023.  She is really relieved about the result.  Of note she has a 5 mm lingular nodule and this is also stable.  She says she has upcoming dental surgery.  She has a dental implant that 64 or 71 years old.  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  She is being recommended extraction and then replacement at least partially.  She is nervous about this but she has noticed pus intermittently coming from her gums.  This past predates her starting CellCept.  I have indicated to her to hold the CellCept before and after the surgery to be extra cautious.  Did discuss the risk would be progressive ILD but the risk would be low given current stability.    OV 12/23/2022  Subjective:  Patient ID: Chelsea Callahan, female , DOB: Sep 16, 1950 , age 64 y.o. , MRN: [id number] , ADDRESS: [address] [place] [address] PCP Elenore Paddy, NP Patient Care Team: Elenore Paddy, NP as PCP - General (Nurse Practitioner)  This Provider for this visit: Treatment Team:  Attending Provider: Kalman Shan, MD    12/23/2022 -   Chief Complaint  Patient presents with   Follow-up    F/up on PFT,    Type of visit: Video Virtual Visit Identification of patient Mi Chap with [birth year] and MRN [id number] - 2 person identifier Risks: Risks, benefits, limitations of telephone visit explained. Patient understood and verbalized agreement to proceed Anyone else on call:  no Patient location: her home This provider location: [address], [address]; [place]; [place] [address]. [facility]. [phone]    HPI Jaretssi Brenneke 72 y.o. -returns for follow-up.  This is a video visit.  Last pulmonary function test was in June 2024 and showed a 10% decline in FVC.  She tells me overall for her activities of daily living she is stable but sometimes she feels she might be more short of breath.  Otherwise no Interim Health status: No new complaints No new medical problems. No new surgeries. No ER visits. No Urgent care visits.   Her upcoming appointment at [facility] ILD clinic is in the December 2024.  Of note she did see Dr. Gala Romney and she had an echocardiogram in June 2024 she had grade 2 grade 2 diastolic dysfunction.  Because of lack of subjective improvement he stopped of audio.  We discussed the most recent pulmonary function test and the decline.  She was about to get another pulmonary function test I had of this visit but this was not scheduled because of the backlog in our office.  She is very upset.  She feels that she is being victimized because of our office inefficiencies.  I initially discussed that it could be a 47-month wait for pulmonary function test.  She does not want to wait that long.  She feels she has a terminal disease.  Every chart to our office scheduling  scheduled for March 03, 2022 which is 3 months since the pulmonary function test.  Also at this time she is now on CellCept.  We took a shared decision making that she will have 1 more pulmonary function test it was the end of October 2023 to give a sequential timeline for Dr. Jon Billings to help with his decision making.  We discussed the fact that nintedanib would be the next agent and whether we will start it now or wait till if any progression.  This will be a question that would be post to Dr. Jon Billings.  The pulmonary function test data should help.  We discussed her respiratory vaccines but currently she wants to hold off.  Of note she has a 5 mm lingular nodule in March 2023.  She is a remote smoker.  We will get a repeat CT scan of the chest in spring or summer 2024.    PFT  OV 04/12/2022  Subjective:  Patient ID: Chelsea Callahan, female , DOB: Mar 06, 1951 , age 51 y.o. , MRN: [id number] , ADDRESS: [address] [place] [address] PCP Elenore Paddy, NP Patient Care Team: Elenore Paddy, NP as PCP - General (Nurse Practitioner)  This Provider for this visit: Treatment Team:  Attending Provider: Kalman Shan, MD    04/12/2022 -   Chief Complaint  Patient presents with   Follow-up    Pt  states she has been doing okay since last visit. States she has had an infection in her mouth that she states her dentist does not know how to treat.     HPI Kaleesi Silvan 72 y.o. -returns for follow-up.  She continues on CellCept 100 mg twice daily.  After seeing me last time she had pulmonary function test that shows stability [see below].  She then saw Dr. Marcelene Butte.  He advised continuing current course with CellCept 5 mg twice daily and then consider add-on therapy.  They discussed biopsy.  But currently that was being withheld.  He also felt it was too early for lung transplant.  He did weigh in on adding a second drug of going up on CellCept but in balance they took a shared decision making to continue current course.  Currently she is content continuing the current course.  She has seen Dr. Deanne Coffer in rheumatology recently and had blood work for her CellCept and she said it is normal.  We again discussed vaccines but she declined.  I did emphasize the risk that comes with respiratory infections in the setting of immunosuppression pulmonary fibrosis but she prefers to defer vaccination.  I then advised her to stay away from human clusters especially during the fall and winter and avoid sick contacts.  She verbalized understanding.  We discussed continued serial monitoring.  She is most accepting of this plan.  She will continue her CellCept at the current dose.  We discussed about ways to monitor her symptoms and if there is any change in her shortness of breath between now and the next visit she will let us know.     OV 07/07/2022  Subjective:  Patient ID: Chelsea Callahan, female , DOB: December 11, 1950 , age 59 y.o. , MRN: [id number] , ADDRESS: [address] [place] [address] PCP Elenore Paddy, NP Patient Care Team: Elenore Paddy, NP as PCP - General (Nurse Practitioner)  This Provider for this visit: Treatment Team:  Attending Provider: Kalman Shan, MD    07/07/2022 -   Chief  Complaint  to see if we can get her spirometry and DLCO done either at our office or in the hospital within the next few weeks.  Patient is okay with this plan.  If this is not possible I will have to look for alternate places where the pulmonary function test can be done.         CT Chest data from date: 07/05/22  - personally visualized and independently interpreted : NO - my findings are: none other than see offical report   IMPRESSION: 1. Today's examination is generally somewhat limited by breath motion artifact within this limitation, no obvious interval change in  mild pulmonary fibrosis in a pattern with apical to basal gradient, featuring irregular peripheral interstitial opacity, septal thickening, and small areas of subpleural bronchiolectasis at the lung bases without clear evidence of honeycombing. Findings are categorized as probable UIP per consensus guidelines: Diagnosis of Idiopathic Pulmonary Fibrosis: An Official ATS/ERS/JRS/ALAT Clinical Practice Guideline. Am Rosezetta Schlatter Crit Care Med Vol 198, Iss 5, [phone], Dec 31 2016. 2. Unchanged 0.5 cm nodule of the posterior lingula, benign, for which no further follow-up or characterization is required. 3. Cardiomegaly and coronary artery disease.   Aortic Atherosclerosis (ICD10-I70.0).     Electronically Signed   By: Jearld Lesch M.D.   On: 07/06/2022 15:51  OV 12/27/2022  Subjective:  Patient ID: Chelsea Callahan, female , DOB: 1950/10/10 , age 72 y.o. , MRN: [id number] , ADDRESS: [address] [place] [address] PCP Elenore Paddy, NP Patient Care Team: Elenore Paddy, NP as PCP - General (Nurse Practitioner)  This Provider for this visit: Treatment Team:  Attending Provider: Kalman Shan, MD    12/27/2022 -   Chief Complaint  Patient presents with   Follow-up    F/up on PFT   +.   HPI Chelsea Callahan 72 y.o. -+ returns for follow-up after her video visit last week.  Actually 4 days ago.  She has had PFTs.  Pulmonary function test continues to be normal.  It is very similar to June 2024.  Although normal it is a slight decline compared to 1 year ago.  1 year ago it was supranormal.  Her symptom scores appears to be range bound.  She continues to have significant amount of anxiety.  She has upcoming visit at [facility] 11, 2023 with Dr. Marcelene Butte ILD clinic.  We discussed about just following with him in December.  With him he took a shared decision making to see her at the midpoint just a clinical visit to make sure the ILD is stable.   Therefore see her back in 7 weeks.  Noticed her anxiety scores are positive hence increase since late last year.  Her last the CMA to ask her about her interest in seeing a counselor.  I noticed that she is not on any SNRI or SSRI or anxiolytics.    OV 02/17/2023  Subjective:  Patient ID: Chelsea Callahan, female , DOB: 1950-08-28 , age 72 y.o. , MRN: [id number] , ADDRESS: [address] [place] [address] PCP Elenore Paddy, NP Patient Care Team: Elenore Paddy, NP as PCP - General (Nurse Practitioner)  This Provider for this visit: Treatment Team:  Attending Provider: Kalman Shan, MD    02/17/2023 -   Chief Complaint  Patient presents with   Follow-up    Pt denies any concerns, still on cellcept     #Undifferentiated connective tissue disease  -Myositis antibody negative's 2023 -

## 2023-02-10 NOTE — Patient Instructions (Addendum)
ICD-10-CM   1. Interstitial lung disease due to connective tissue disease (HCC)  J84.89    M35.9     2. Raynaud's disease without gangrene  I73.00     3. ANA positive  R76.8     4. 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 On Cellcept therapy  Z79.899     6. Immunosuppressed status (HCC)  D84.9     7. Vaccine counseling  Z71.85      Interstitial lung disease due to connective tissue disease (HCC) Raynaud's disease without gangrene ANA positive Myositis associated antibody positive On Cellcept therapy Immunosuppressed status (HCC)   - tolerating well cellcept 500mg  twice daily since May/June 2023 via Dr Deanne Coffer - 2nd opinion at [facility] Dr Jon Billings - Nov 2023: continue current course - Visit 12/27/2022   - PFt June 2024/Aug 2024 are slightly worse than June 2023 but still within normal range - Simple exercise test without o2 drop - Visit 02/10/2023  - clinically stable .Marland Kitchen Sorry PFT was not done  Plan - continue cellcept via Dr Deanne Coffer  - monitor symptoms  -any worsening then consider adding new medications (ofev or Rituxan or increaseing cellcept)  - keep [place] posted about any respiratory infections - keep [facility] appt iwht Dr Jon Billings in Nov 2024 - do spirometry and dlco in 16 weeks  Pulmonary Hypertension  - off  revatio  Plan - per  Dr Teressa Lower  5mm Lingular Nodule March 2023 -> no change MArch 2024  Plan  - expectant followup   Followup  -16 weeks after spiromery and dlco with Dr Marchelle Gearing; 3- min visit  - symptoms score and sit/stand test at followup

## 2023-03-21 ENCOUNTER — Encounter: Payer: Self-pay | Admitting: Internal Medicine

## 2023-03-22 ENCOUNTER — Encounter: Payer: Self-pay | Admitting: Nurse Practitioner

## 2023-03-22 ENCOUNTER — Encounter: Payer: Self-pay | Admitting: Gastroenterology

## 2023-03-22 DIAGNOSIS — Z1211 Encounter for screening for malignant neoplasm of colon: Secondary | ICD-10-CM

## 2023-03-28 NOTE — Telephone Encounter (Signed)
   It could be the medicines.  She should talk to her rheumatologist about CellCept.  She can try over-the-counter ginger capsules.  Make sure that her rheumatologist is prescribing CellCept as done lab work for her.   Current Outpatient Medications:    aspirin EC 81 MG tablet, Take 81 mg by mouth daily. Swallow whole., Disp: , Rfl:    Cholecalciferol (CVS D3) 25 MCG (1000 UT) capsule, Take 1,000 Units by mouth daily., Disp: , Rfl:    Fe Bisgly-Succ-C-Thre-B12-FA (IRON-150 PO), Take 1 tablet by mouth daily., Disp: , Rfl:    hydroxychloroquine (PLAQUENIL) 200 MG tablet, TAKE 1 TABLET BY MOUTH EVERY DAY, Disp: 90 tablet, Rfl: 0   Multiple Vitamin (MULTIVITAMIN WITH MINERALS) TABS tablet, Take 1 tablet by mouth daily., Disp: , Rfl:    mycophenolate (CELLCEPT) 500 MG tablet, Take 500 mg by mouth 2 (two) times daily., Disp: , Rfl:    OVER THE COUNTER MEDICATION, Take 1 capsule by mouth daily. Sea [place], Disp: , Rfl:    psyllium (METAMUCIL) 58.6 % packet, Take 1 packet by mouth daily as needed (constipation)., Disp: , Rfl:

## 2023-04-03 DIAGNOSIS — F17200 Nicotine dependence, unspecified, uncomplicated: Secondary | ICD-10-CM | POA: Diagnosis not present

## 2023-04-03 DIAGNOSIS — I7300 Raynaud's syndrome without gangrene: Secondary | ICD-10-CM | POA: Diagnosis not present

## 2023-04-03 DIAGNOSIS — L659 Nonscarring hair loss, unspecified: Secondary | ICD-10-CM | POA: Diagnosis not present

## 2023-04-03 DIAGNOSIS — I272 Pulmonary hypertension, unspecified: Secondary | ICD-10-CM | POA: Diagnosis not present

## 2023-04-03 DIAGNOSIS — J84111 Idiopathic interstitial pneumonia, not otherwise specified: Secondary | ICD-10-CM | POA: Diagnosis not present

## 2023-04-03 DIAGNOSIS — Z79899 Other long term (current) drug therapy: Secondary | ICD-10-CM | POA: Diagnosis not present

## 2023-04-03 DIAGNOSIS — Z79624 Long term (current) use of inhibitors of nucleotide synthesis: Secondary | ICD-10-CM | POA: Diagnosis not present

## 2023-04-19 ENCOUNTER — Ambulatory Visit (AMBULATORY_SURGERY_CENTER): Payer: Medicare Other

## 2023-04-19 VITALS — Ht 62.0 in | Wt 175.0 lb

## 2023-04-19 DIAGNOSIS — R1319 Other dysphagia: Secondary | ICD-10-CM

## 2023-04-19 NOTE — Progress Notes (Signed)

## 2023-05-04 ENCOUNTER — Encounter: Payer: Self-pay | Admitting: Certified Registered Nurse Anesthetist

## 2023-05-08 ENCOUNTER — Encounter: Payer: Self-pay | Admitting: Nurse Practitioner

## 2023-05-08 ENCOUNTER — Encounter: Payer: Self-pay | Admitting: Gastroenterology

## 2023-05-08 ENCOUNTER — Ambulatory Visit: Payer: Medicare Other | Admitting: Gastroenterology

## 2023-05-08 VITALS — BP 160/82 | HR 68 | Temp 97.3°F | Resp 16 | Ht 62.0 in | Wt 175.0 lb

## 2023-05-08 DIAGNOSIS — K449 Diaphragmatic hernia without obstruction or gangrene: Secondary | ICD-10-CM | POA: Diagnosis not present

## 2023-05-08 DIAGNOSIS — K259 Gastric ulcer, unspecified as acute or chronic, without hemorrhage or perforation: Secondary | ICD-10-CM | POA: Diagnosis not present

## 2023-05-08 DIAGNOSIS — K269 Duodenal ulcer, unspecified as acute or chronic, without hemorrhage or perforation: Secondary | ICD-10-CM

## 2023-05-08 DIAGNOSIS — K2980 Duodenitis without bleeding: Secondary | ICD-10-CM | POA: Diagnosis not present

## 2023-05-08 DIAGNOSIS — K31A15 Gastric intestinal metaplasia without dysplasia, involving multiple sites: Secondary | ICD-10-CM | POA: Diagnosis not present

## 2023-05-08 DIAGNOSIS — K2950 Unspecified chronic gastritis without bleeding: Secondary | ICD-10-CM

## 2023-05-08 DIAGNOSIS — Z1810 Retained metal fragments, unspecified: Secondary | ICD-10-CM

## 2023-05-08 DIAGNOSIS — Z9884 Bariatric surgery status: Secondary | ICD-10-CM | POA: Diagnosis not present

## 2023-05-08 DIAGNOSIS — Z9889 Other specified postprocedural states: Secondary | ICD-10-CM

## 2023-05-08 DIAGNOSIS — Z8711 Personal history of peptic ulcer disease: Secondary | ICD-10-CM | POA: Diagnosis not present

## 2023-05-08 DIAGNOSIS — Q396 Congenital diverticulum of esophagus: Secondary | ICD-10-CM

## 2023-05-08 MED ORDER — OMEPRAZOLE 40 MG PO CPDR: 40.0000 mg | DELAYED_RELEASE_CAPSULE | Freq: Two times a day (BID) | ORAL | 3 refills | Status: AC

## 2023-05-08 MED ORDER — SODIUM CHLORIDE 0.9 % IV SOLN: 500.0000 mL | Freq: Once | INTRAVENOUS | Status: DC

## 2023-05-08 NOTE — Progress Notes (Signed)
 Called to room to assist during endoscopic procedure.  Patient ID and intended procedure confirmed with present staff. Received instructions for my participation in the procedure from the performing physician.

## 2023-05-08 NOTE — Progress Notes (Signed)
1012 Robinul 0.1 mg IV given due large amount of secretions upon assessment.  MD made aware, vss

## 2023-05-08 NOTE — Patient Instructions (Addendum)
 Resume previous diet Continue present medications, Pick up new Rx for Omeprazole , take twice daily, 30 minutes before eating, you will not notice an immediate effect, it may take 2-3 weeks to notice benefit from this medication, continue taking at least until the next EGD so your ulcers will heal. Await pathology results Repeat EGD in 3 months, call in the next month or so to schedule, [phone], you will also receive a reminder letter in March.  Avoid all NSAIDS (Non-Steroidal anti-inflammatory drugs).  (These include, aspirin , aspirin -containing products(products containing salicylic acid like Pepto Bismol and Alka Seltzer), ibuprofen, advil, motrin, naproxen, aleve, goody powders, etc) Tylenol  is ok to take as needed, see label for instructions.  Handouts/information given for Hiatal hernia, gastric ulcers(peptic ulcer)  YOU HAD AN ENDOSCOPIC PROCEDURE TODAY AT THE [facility]:   Refer to the procedure report that was given to you for any specific questions about what was found during the examination.  If the procedure report does not answer your questions, please call your gastroenterologist to clarify.  If you requested that your care partner not be given the details of your procedure findings, then the procedure report has been included in a sealed envelope for you to review at your convenience later.  YOU SHOULD EXPECT: Some feelings of bloating in the abdomen. Passage of more gas than usual.  Walking can help get rid of the air that was put into your GI tract during the procedure and reduce the bloating. If you had a lower endoscopy (such as a colonoscopy or flexible sigmoidoscopy) you may notice spotting of blood in your stool or on the toilet paper. If you underwent a bowel prep for your procedure, you may not have a normal bowel movement for a few days.  Please Note:  You might notice some irritation and congestion in your nose or some drainage.  This is from the oxygen used  during your procedure.  There is no need for concern and it should clear up in a day or so.  SYMPTOMS TO REPORT IMMEDIATELY:  Following upper endoscopy (EGD)  Vomiting of blood or coffee ground material  New chest pain or pain under the shoulder blades  Painful or persistently difficult swallowing  New shortness of breath  Fever of 100F or higher  Black, tarry-looking stools  For urgent or emergent issues, a gastroenterologist can be reached at any hour by calling [phone]. Do not use MyChart messaging for urgent concerns.   DIET:  We do recommend a small meal at first, but then you may proceed to your regular diet.  Drink plenty of fluids but you should avoid alcoholic beverages for 24 hours.  ACTIVITY:  You should plan to take it easy for the rest of today and you should NOT DRIVE or use heavy machinery until tomorrow (because of the sedation medicines used during the test).    FOLLOW UP: Our staff will call the number listed on your records the next business day following your procedure.  We will call around 7:15- 8:00 am to check on you and address any questions or concerns that you may have regarding the information given to you following your procedure. If we do not reach you, we will leave a message.     If any biopsies were taken you will be contacted by phone or by letter within the next 1-3 weeks.  Please call us  at [phone] if you have not heard about the biopsies in 3 weeks.   SIGNATURES/CONFIDENTIALITY:  You and/or your care partner have signed paperwork which will be entered into your electronic medical record.  These signatures attest to the fact that that the information above on your After Visit Summary has been reviewed and is understood.  Full responsibility of the confidentiality of this discharge information lies with you and/or your care-partner.

## 2023-05-08 NOTE — Progress Notes (Signed)
 [facility] History and Physical   Primary Care Physician:  Elnor Lauraine BRAVO, NP   Reason for Procedure:   History of gastric ulcer, history of gastric bypass  Plan:    EGD     HPI: Chelsea Callahan is a 73 y.o. female  here for surveillance of gastric ulcer. The last time I saw her was 08/2018 - abnormal EGD s/p gastric bypass with either a fistula vs. Surgical anastomosis to the bypassed stomach with an ulcer in the antrum. I had recommended a repeat EGD at the time 3 months later to survey the area. I have not seen the patient since that time.   She states she forgot she had a prior endoscopy and that she was supposed to follow up for it. She was told to take omeprazole  twice daily at the time, she is not taking it now and can't recall how long she took it for. Does endorse occasional NSAID use.  She had an episode of abdominal pain after she ate in recent past. Normally no nausea or vomiting. Denies dysphagia at baseline. She has not had a follow up EGD anywhere else since I have seen her or imaging of her abdomen. Otherwise she feels well today without complaints.   I have discussed risks / benefits of anesthesia and endoscopic procedure with Darice Chick and they wish to proceed with the exams as outlined today.    Past Medical History:  Diagnosis Date   Anemia    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HCC)    Blood transfusion without reported diagnosis    Cataract    bil/ per pt   GERD (gastroesophageal reflux disease)    Lung disease 2022   Primary osteoarthritis of right knee    Raynaud disease    Raynaud disease    Spinal headache    c-section   Vasculitis (HCC)    on prednisone  -     Past Surgical History:  Procedure Laterality Date   abdominal clip     to stomach post gastric bypass   AXILLARY LYMPH NODE BIOPSY Right 01/27/2021   Procedure: RIGHT AXILLARY LYMPH NODE BIOPSY;  Surgeon: Vanderbilt Ned, MD;  Location: [facility];  Service:  General;  Laterality: Right;   CESAREAN SECTION     x 2   cortisone injection Right 12/2022   shoulder   GASTRIC BYPASS     HERNIA REPAIR  2010   inverted hermia   HIP ARTHROPLASTY     JOINT REPLACEMENT Right 2005   hip   JOINT REPLACEMENT Bilateral    knee    KNEE ARTHROPLASTY Left 09/13/2013   Procedure: LEFT COMPUTER ASSISTED TOTAL KNEE ARTHROPLASTY;  Surgeon: Oneil JAYSON Herald, MD;  Location: [facility] OR;  Service: Orthopedics;  Laterality: Left;  Left Total Knee Arthroplasty, Computer Assist   KNEE ARTHROPLASTY Right 05/23/2014   Procedure: COMPUTER ASSISTED TOTAL KNEE ARTHROPLASTY;  Surgeon: Oneil JAYSON Herald, MD;  Location: [facility] OR;  Service: Orthopedics;  Laterality: Right;   LUMBAR LAMINECTOMY/DECOMPRESSION MICRODISCECTOMY N/A 05/13/2016   Procedure: L4-5 Decompression;  Surgeon: Oneil JAYSON Herald, MD;  Location[facility] OR;  Service: Orthopedics;  Laterality: N/A;   RIGHT HEART CATH N/A 10/27/2021   Procedure: RIGHT HEART CATH;  Surgeon: Cherrie Toribio SAUNDERS, MD;  Location: [facility];  Service: Cardiovascular;  Laterality: N/A;   teeth etraction     TOTAL KNEE ARTHROPLASTY Right 05/22/2014   dr herald   TUBAL LIGATION  1981   UPPER GI ENDOSCOPY  Prior to Admission medications   Medication Sig Start Date End Date Taking? Authorizing Provider  aspirin  EC 81 MG tablet Take 81 mg by mouth daily. Swallow whole.    [provider]  Cholecalciferol (CVS D3) 25 MCG (1000 UT) capsule Take 1,000 Units by mouth daily.    [provider]  Fe Bisgly-Succ-C-Thre-B12-FA (IRON-150 PO) Take 1 tablet by mouth every other day.    [provider]  hydroxychloroquine  (PLAQUENIL ) 200 MG tablet TAKE 1 TABLET BY MOUTH EVERY DAY 10/01/21   Cheryl Waddell HERO, PA-C  Multiple Vitamin (MULTIVITAMIN WITH MINERALS) TABS tablet Take 1 tablet by mouth daily.    [provider]  mycophenolate (CELLCEPT) 500 MG tablet Take 500 mg by mouth 2 (two) times daily.    [provider]   OVER THE COUNTER MEDICATION Take 1 capsule by mouth daily. [employer], Historical, MD  psyllium (METAMUCIL) 58.6 % packet Take 1 packet by mouth daily as needed (constipation).    [provider]    Current Outpatient Medications  Medication Sig Dispense Refill   aspirin  EC 81 MG tablet Take 81 mg by mouth daily. Swallow whole.     Cholecalciferol (CVS D3) 25 MCG (1000 UT) capsule Take 1,000 Units by mouth daily.     Fe Bisgly-Succ-C-Thre-B12-FA (IRON-150 PO) Take 1 tablet by mouth every other day.     hydroxychloroquine  (PLAQUENIL ) 200 MG tablet TAKE 1 TABLET BY MOUTH EVERY DAY 90 tablet 0   Moringa Oleifera (MORINGA PO) Take by mouth.     Multiple Vitamin (MULTIVITAMIN WITH MINERALS) TABS tablet Take 1 tablet by mouth daily.     mycophenolate (CELLCEPT) 500 MG tablet Take 500 mg by mouth 2 (two) times daily.     NON FORMULARY [place]     OVER THE COUNTER MEDICATION Take 1 capsule by mouth daily. Sea Moss     psyllium (METAMUCIL) 58.6 % packet Take 1 packet by mouth daily as needed (constipation).     Current Facility-Administered Medications  Medication Dose Route Frequency Provider Last Rate Last Admin   0.9 %  sodium chloride  infusion  500 mL Intravenous Once Sabastien Tyler, Elspeth SQUIBB, MD        Allergies as of 05/08/2023 - Review Complete 05/08/2023  Allergen Reaction Noted   Ginger Swelling and Other (See Comments) 09/03/2013   Pork-derived products Other (See Comments) 05/13/2014   Shellfish allergy Other (See Comments) 05/13/2014    Family History  Problem Relation Age of Onset   Cancer Brother    Multiple myeloma Brother    Colon cancer Neg Hx    Esophageal cancer Neg Hx    Rectal cancer Neg Hx    Stomach cancer Neg Hx    Colon polyps Neg Hx     Social History   Socioeconomic History   Marital status: Married    Spouse name: Not on file   Number of children: Not on file   Years of education: Not on file   Highest education level: Not on file   Occupational History   Not on file  Tobacco Use   Smoking status: Former    Current packs/day: 0.00    Average packs/day: 0.3 packs/day for 15.0 years (3.8 ttl pk-yrs)    Types: Cigarettes    Start date: 30    Quit date: 1999    Years since quitting: 26.0    Passive exposure: Past   Smokeless tobacco: Never  Vaping Use   Vaping status:  Never Used  Substance and Sexual Activity   Alcohol use: Not Currently    Comment: 6 glasses of wine/month   Drug use: No   Sexual activity: Not on file  Other Topics Concern   Not on file  Social History Narrative   Not on file   Social Drivers of Health   Financial Resource Strain: Not on file  Food Insecurity: Not on file  Transportation Needs: Not on file  Physical Activity: Not on file  Stress: Not on file  Social Connections: Not on file  Intimate Partner Violence: Not on file    Review of Systems: All other review of systems negative except as mentioned in the HPI.  Physical Exam: Vital signs BP (!) 159/87   Pulse 75   Temp (!) 97.3 F (36.3 C) (Temporal)   Resp 13   Ht 5' 2 (1.575 m)   Wt 175 lb (79.4 kg)   SpO2 93%   BMI 32.01 kg/m   General:   Alert,  Well-developed, pleasant and cooperative in NAD Lungs:  Clear throughout to auscultation.   Heart:  Regular rate and rhythm Abdomen:  Soft, nontender and nondistended.   Neuro/Psych:  Alert and cooperative. Normal mood and affect. A and O x 3  Marcey Naval, MD [facility]

## 2023-05-08 NOTE — Op Note (Signed)
 [facility] Patient Name: Chelsea Callahan Procedure Date: 05/08/2023 9:46 AM MRN: [id number] Endoscopist: Elspeth P. 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 Follow                            up EGD was recommended to be done in 3 months after                            that exam and placed on PPI. I have not seen her                            since that time. Medicines:                Monitored Anesthesia Care Procedure:                Pre-Anesthesia Assessment:                           - Prior to the procedure, a History and Physical                            was performed, and patient medications and                            allergies were reviewed. The patient's tolerance of                            previous anesthesia was also reviewed. The risks                            and benefits of the procedure and the sedation                            options and risks were discussed with the patient.                            All questions were answered, and informed consent  was obtained. Prior Anticoagulants: The patient has                            taken no anticoagulant or antiplatelet agents. ASA                            Grade Assessment: II - A patient with mild systemic                            disease. After reviewing the risks and benefits,                            the patient was deemed in satisfactory condition to                             undergo the procedure.                           After obtaining informed consent, the endoscope was                            passed under direct vision. Throughout the                            procedure, the patient's blood pressure, pulse, and                            oxygen saturations were monitored continuously. The                            Olympus Scope [id number] was introduced through the                            mouth, and advanced to the second part of duodenum.                            The upper GI endoscopy was accomplished without                            difficulty. The patient tolerated the procedure                            well. Scope In: Scope Out: Findings:                 The Z-line was regular and was found 33 cm from the                            incisors.                           A small hiatal hernia was present.                           A diverticulum with a small opening was  found in                            the lower third of the esophagus.                           The exam of the esophagus was otherwise normal. Of                            note, the patient coughed the scope out of the last                            few cm when visualizing and woke up after that, so                            scope not replaced. No obvious pathology in the                            proximal esophagus.[                           Evidence of a Roux-en-Y gastrojejunostomy was                            found. The gastrojejunal anastomosis was                            characterized by healthy appearing mucosa and                            visible suture in the carda. The anatomy was                            altered. There are 3 lumens coming off the gastric                            pouch. On the right there is a normal appearing                            blind limb. On the left, the lumen opens into 2                            orifices - one leads  to the normal small bowel                            limb, and another into the gastric remnant (suspect                            fistula, although she reports complex gastric                            surgery in the past)  In the remannt stomach, the pyloric channel is                            scarred, and two non-bleeding small linear gastric                            ulcers with no stigmata of bleeding were found at                            the pyloric.                           The exam of the stomach was otherwise normal other                            than retained staples in the fundus.                           Biopsies were taken with a cold forceps for                            Helicobacter pylori testing.                           One non-bleeding cratered duodenal ulcer with no                            stigmata of bleeding was found in the duodenal                            bulb. The lesion was roughly 4-5 mm in largest                            dimension. Biopsies were taken with a cold forceps                            for histology from the periphery of it, as it had                            heaped up margins.                           The exam of the duodenum was otherwise normal. Complications:            No immediate complications. Estimated blood loss:                            Minimal. Estimated Blood Loss:     Estimated blood loss was minimal. Impression:               - Z-line regular, 33 cm from the incisors.                           - Small hiatal hernia.                           -  Diverticulum in the lower third of the esophagus.                           - Roux-en-Y gastrojejunostomy with gastrojejunal                            anastomosis characterized by healthy appearing                            mucosa and visible suture.                           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                           - Non-bleeding duodenal ulcer with no stigmata of                            bleeding. Biopsied.                           - Biopsies were taken with a cold forceps for                            Helicobacter pylori testing. Recommendation:           - Patient has a contact number available for                            emergencies. The signs and symptoms of potential                            delayed complications were discussed with the                            patient. Return to normal activities tomorrow.                            Written discharge instructions were provided to the                            patient.                           -  Advance diet as tolerated.                           - Continue present medications.                           - Start omeprazole  40mg  twice daily                           - Await pathology results.                           - Repeat EGD in 3 months to assess for mucosal                            healing. Will discuss importance of this with her.                           - Avoid all NSAIDs                           - Consideration for follow up with bariatric                            surgeon if she has not already. Hopefully her                            discomfort improves with PPI Chelsea Dykes P. Kyan Yurkovich, MD 05/08/2023 10:42:44 AM This report has been signed electronically.

## 2023-05-08 NOTE — Progress Notes (Signed)
 Pt's states no medical or surgical changes since previsit or office visit.

## 2023-05-08 NOTE — Progress Notes (Signed)
 Report given to PACU, vss

## 2023-05-09 ENCOUNTER — Telehealth: Payer: Self-pay | Admitting: *Deleted

## 2023-05-09 ENCOUNTER — Encounter: Payer: Self-pay | Admitting: Gastroenterology

## 2023-05-09 NOTE — Telephone Encounter (Signed)
 I looked into this. Use of PPI is not contraindicated with Cellcept, but it is recommended to monitor therapy if using both at the same time.  She has ulcers in her stomach and abdominal pain, I think we need to treat the ulcers. The omeprazole  can reduce the concentration of cellcept and how well that may work for her. Specifically, the enteric coated mycophenolate sodium appears to be less sensitive to this interaction than mycophenolate mofetil. Patient should contact her prescribing physician for the cellcept and let them know she has ulcers and needs to use the omeprazole . I would use the omeprazole  only ONCE daily and not twice daily, if you can let her know. Without the omeprazole  it will be difficult for the ulcers to heal

## 2023-05-09 NOTE — Telephone Encounter (Signed)
 Follow up call attempted for Cellcept and Omeprazole.  Left message for patient to return call to discuss Dr. Adela Lank recommendations.

## 2023-05-09 NOTE — Telephone Encounter (Signed)
  Follow up Call-     05/08/2023    9:48 AM  Call back number  Post procedure Call Back phone  # [phone]  Permission to leave phone message Yes     Patient questions:  Do you have a fever, pain , or abdominal swelling? No. Pain Score  0 *  Have you tolerated food without any problems? No.  Have you been able to return to your normal activities? Yes.    Do you have any questions about your discharge instructions: Diet   No. Medications  No. Follow up visit  No.  Do you have questions or concerns about your Care? No.  Actions: * If pain score is 4 or above: No action needed, pain <4.  Pt stated that she picked up her new prescription at the pharmacy and the pharmacist told her that her omeprazole  was contraindicated with her Cellcept.  Therefore, she will need a different medication or timing or administration.

## 2023-05-09 NOTE — Telephone Encounter (Signed)
 Spoke with patient regarding concerns over Cellcept being contraindicated with omeprazole .  Pt notified of Dr. Hassan recommendations.  Dr. Leigh stated that use of PPI is not contraindicated with Cellcept, but it is recommended to monitor therapy if using both at the same time.  In addition, let pt know that she should contact her prescribing physician for the cellcept and let them know she has ulcers and needs to use the omeprazole .  She could also use the omeprazole  only ONCE daily and not twice daily.  Lastly, pt aware that without the omeprazole  it will be difficult for the ulcers to heal.  Pt verbalized understanding.

## 2023-05-10 LAB — SURGICAL PATHOLOGY

## 2023-05-12 ENCOUNTER — Telehealth (INDEPENDENT_AMBULATORY_CARE_PROVIDER_SITE_OTHER): Payer: Medicare Other | Admitting: Nurse Practitioner

## 2023-05-12 ENCOUNTER — Encounter: Payer: Self-pay | Admitting: Nurse Practitioner

## 2023-05-12 ENCOUNTER — Ambulatory Visit: Payer: Medicare Other | Admitting: Orthopaedic Surgery

## 2023-05-12 DIAGNOSIS — Z6832 Body mass index (BMI) 32.0-32.9, adult: Secondary | ICD-10-CM | POA: Diagnosis not present

## 2023-05-12 DIAGNOSIS — E66811 Obesity, class 1: Secondary | ICD-10-CM | POA: Diagnosis not present

## 2023-05-12 DIAGNOSIS — J329 Chronic sinusitis, unspecified: Secondary | ICD-10-CM | POA: Diagnosis not present

## 2023-05-12 MED ORDER — TIRZEPATIDE-WEIGHT MANAGEMENT 2.5 MG/0.5ML ~~LOC~~ SOLN: 2.5000 mg | SUBCUTANEOUS | 1 refills | Status: DC

## 2023-05-12 MED ORDER — FLUTICASONE PROPIONATE 50 MCG/ACT NA SUSP: 2.0000 | Freq: Every day | NASAL | 6 refills | Status: DC

## 2023-05-12 NOTE — Assessment & Plan Note (Signed)
 Chronic Current BMI 32.01 and current weight 100 send 5 pounds We discussed tirzepatide  and risks and benefits of this.  We discussed injector versus vial prescription options and possible out-of-pocket costs.  She would like to try the vial.  I did reach out to the [employer]  drug representative and have sent in prescription for the 2.5 mg/week injection via solution and a vial.  I will reach out to patient as well to verify that this prescription has been sent to the specialty pharmacy and she is to let me know if there are any issues receiving the medication.  She we will follow-up in about 4 weeks to see how she is tolerating the medication.

## 2023-05-12 NOTE — Assessment & Plan Note (Signed)
 Acute We discussed symptom management versus antibiotic treatment.  Due to potential interactions with CellCept and antibiotics she feels that she would like to manage her symptoms without antibiotics at this time.  She was encouraged to use Flonase  nasal spray and prescription sent to pharmacy. She was told if she spikes a fever or starts to feel progressively worse to reach back out at which point we can consider antibiotic therapy.  She reports her understanding.

## 2023-05-12 NOTE — Progress Notes (Signed)
 Established Patient Office Visit  An audio/visual tele-health visit was completed today for this patient. I connected with  Chelsea Callahan on 05/12/23 utilizing audio/visual technology and verified that I am speaking with the correct person using two identifiers. The patient was located at their home, and I was located at the office of [facility] at [facility] during the encounter. I discussed the limitations of evaluation and management by telemedicine. The patient expressed understanding and agreed to proceed.     Subjective   Patient ID: Chelsea Callahan, female    DOB: 07-16-1950  Age: 73 y.o. MRN: [id number]  Chief Complaint  Patient presents with   Obesity   Patient arrives today for virtual visit for the above.  Obesity: She has been trying to focus on lifestyle modification to help with weight loss but has been unsuccessful thus far.  We had a requested prior authorization for tirzepatide  in the past but this was denied by her insurance.  She reports today current weight is 175 and current BMI is 32.01.  She like to lose about 20 pounds and is willing to pay tirzepatide  out-of-pocket so she want to discuss this further today.  She has no personal history of thyroid  cancer or family history of thyroid  cancer.  Nasal congestion: Has had nasal congestion for about 2 weeks.  No fever but has been quite tired.  She would like to discuss treatment options at this point.     Review of Systems  Constitutional:  Positive for malaise/fatigue. Negative for chills and fever.  HENT:  Positive for hearing loss. Negative for congestion and sinus pain.   Respiratory:  Positive for cough, sputum production and shortness of breath. Negative for wheezing.   Musculoskeletal:  Negative for myalgias.  Neurological:  Negative for headaches.      Objective:     There were no vitals taken for this visit.   Physical Exam Comprehensive physical exam not completed today as  office visit was conducted remotely.  Patient appears stable and overall well over video.  No evidence of respiratory distress noted..  Patient was alert and oriented, and appeared to have appropriate judgment.   No results found for any visits on 05/12/23.    The 10-year ASCVD risk score (Arnett DK, et al., 2019) is: 21.5%    Assessment & Plan:   Problem List Items Addressed This Visit       Respiratory   Sinusitis - Primary   Acute We discussed symptom management versus antibiotic treatment.  Due to potential interactions with CellCept and antibiotics she feels that she would like to manage her symptoms without antibiotics at this time.  She was encouraged to use Flonase  nasal spray and prescription sent to pharmacy. She was told if she spikes a fever or starts to feel progressively worse to reach back out at which point we can consider antibiotic therapy.  She reports her understanding.      Relevant Medications   fluticasone  (FLONASE ) 50 MCG/ACT nasal spray     Other   Class 1 obesity with serious comorbidity and body mass index (BMI) of 32.0 to 32.9 in adult   Chronic Current BMI 32.01 and current weight 100 send 5 pounds We discussed tirzepatide  and risks and benefits of this.  We discussed injector versus vial prescription options and possible out-of-pocket costs.  She would like to try the vial.  I did reach out to the [employer]  drug representative and have sent in prescription for the 2.5  mg/week injection via solution and a vial.  I will reach out to patient as well to verify that this prescription has been sent to the specialty pharmacy and she is to let me know if there are any issues receiving the medication.  She we will follow-up in about 4 weeks to see how she is tolerating the medication.      Relevant Medications   tirzepatide  (ZEPBOUND ) 2.5 MG/0.5ML injection vial    No follow-ups on file.    Lauraine FORBES Pereyra, NP

## 2023-05-16 ENCOUNTER — Ambulatory Visit: Payer: Medicare Other | Admitting: Orthopaedic Surgery

## 2023-05-16 ENCOUNTER — Encounter: Payer: Self-pay | Admitting: Orthopaedic Surgery

## 2023-05-16 VITALS — BP 152/76 | HR 71 | Ht 62.0 in | Wt 175.0 lb

## 2023-05-16 DIAGNOSIS — M7541 Impingement syndrome of right shoulder: Secondary | ICD-10-CM

## 2023-05-17 ENCOUNTER — Encounter: Payer: Self-pay | Admitting: Nurse Practitioner

## 2023-05-17 DIAGNOSIS — M7541 Impingement syndrome of right shoulder: Secondary | ICD-10-CM

## 2023-05-17 MED ORDER — LIDOCAINE HCL 1 % IJ SOLN
0.5000 mL | INTRAMUSCULAR | Status: AC | PRN
  Administered 2023-05-17: .5 mL

## 2023-05-17 MED ORDER — METHYLPREDNISOLONE ACETATE 40 MG/ML IJ SUSP
40.0000 mg | INTRAMUSCULAR | Status: AC | PRN
  Administered 2023-05-17: 40 mg via INTRA_ARTICULAR

## 2023-05-17 MED ORDER — BUPIVACAINE HCL 0.25 % IJ SOLN
4.0000 mL | INTRAMUSCULAR | Status: AC | PRN
  Administered 2023-05-17: 4 mL via INTRA_ARTICULAR

## 2023-05-17 NOTE — Progress Notes (Signed)
 Office Visit Note   Patient: Chelsea Callahan           Date of Birth: December 30, 1950           MRN: [id number] Visit Date: 05/16/2023              Requested by: Elnor Lauraine BRAVO, NP [address] [place],  [place] [address] PCP: Elnor Lauraine BRAVO, NP   Assessment & Plan: Visit Diagnoses:  1. Impingement syndrome of right shoulder     Plan: Right shoulder injection performed which she tolerated.  She has persistent problems she will let us  know.  Follow-Up Instructions: No follow-ups on file.   Orders:  No orders of the defined types were placed in this encounter.  No orders of the defined types were placed in this encounter.     Procedures: Large Joint Inj: R subacromial bursa on 05/17/2023 1:05 PM Indications: pain Details: 22 G 1.5 in needle  Arthrogram: No  Medications: 4 mL bupivacaine  0.25 %; 40 mg methylPREDNISolone  acetate 40 MG/ML; 0.5 mL lidocaine  1 % Outcome: tolerated well, no immediate complications Procedure, treatment alternatives, risks and benefits explained, specific risks discussed. Consent was given by the patient. Immediately prior to procedure a time out was called to verify the correct patient, procedure, equipment, support staff and site/side marked as required. Patient was prepped and draped in the usual sterile fashion.       Clinical Data: No additional findings.   Subjective: Chief Complaint  Patient presents with   Right Shoulder - Pain    HPI 73 year old female returns with recurrent problems of the right shoulder.  Previous injection 01/24/2023 gave her many weeks of relief.  She states shot seems to be wearing off increased problems outstretched and overhead activities.  She is right-hand dominant.  She was taking higher doses of aspirin  and was told by PCP she should not be doing this.  Does have history of lumbar laminectomy for spinal decompression pulmonary hypertension.  Past history of gastric bypass.  Review of Systems all systems  noncontributory to HPI.   Objective: Vital Signs: BP (!) 152/76   Pulse 71   Ht 5' 2 (1.575 m)   Wt 175 lb (79.4 kg)   BMI 32.01 kg/m   Physical Exam Constitutional:      Appearance: She is well-developed.  HENT:     Head: Normocephalic.     Right Ear: External ear normal.     Left Ear: External ear normal. There is no impacted cerumen.  Eyes:     Pupils: Pupils are equal, round, and reactive to light.  Neck:     Thyroid : No thyromegaly.     Trachea: No tracheal deviation.  Cardiovascular:     Rate and Rhythm: Normal rate.  Pulmonary:     Effort: Pulmonary effort is normal.  Abdominal:     Palpations: Abdomen is soft.  Musculoskeletal:     Cervical back: No rigidity.  Skin:    General: Skin is warm and dry.  Neurological:     Mental Status: She is alert and oriented to person, place, and time.  Psychiatric:        Behavior: Behavior normal.     Ortho Exam positive pension right shoulder negative drop arm test.  Good cervical range of motion no brachioplexus tenderness.  Reflexes are symmetrical upper extremity sensation is intact.  Specialty Comments:  No specialty comments available.  Imaging: No results found.   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   Onychodystrophy 09/24/2021   Axillary mass, right 09/24/2021   Anxiety 09/24/2021   Anemia 08/13/2021   Chronic pain of both knees 12/27/2017   Pain in left hand 12/27/2017   History of lumbar laminectomy for spinal cord decompression  12/27/2017   Lumbar stenosis 05/13/2016   High risk medication use 05/11/2016   Primary osteoarthritis of both feet 05/11/2016   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   Low back pain 03/09/2016   Spinal stenosis of lumbar region 03/09/2016   Osteoarthritis of right knee 05/27/2014   Autoimmune disease (HCC) 09/24/2013   Rheumatoid arthritis (HCC) 09/24/2013   Osteoarthritis of left knee 09/13/2013    Class: Diagnosis of   Past Medical History:  Diagnosis Date   Anemia    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HCC)    Blood transfusion without reported diagnosis    Cataract    bil/ per pt   GERD (gastroesophageal reflux disease)    Lung disease 2022   Primary osteoarthritis of right knee    Raynaud disease    Raynaud disease    Spinal headache    c-section   Vasculitis (HCC)    on prednisone  -     Family History  Problem Relation Age of Onset   Cancer Brother    Multiple myeloma Brother    Colon cancer Neg Hx    Esophageal cancer Neg Hx    Rectal cancer Neg Hx    Stomach cancer Neg Hx    Colon polyps Neg Hx     Past Surgical History:  Procedure Laterality Date   abdominal clip     to stomach post gastric bypass   AXILLARY LYMPH NODE BIOPSY Right 01/27/2021   Procedure: RIGHT AXILLARY LYMPH NODE BIOPSY;  Surgeon: Vanderbilt Ned, MD;  Location: [facility];  Service: General;  Laterality: Right;   CESAREAN SECTION     x 2   cortisone injection Right 12/2022   shoulder   GASTRIC BYPASS     HERNIA REPAIR  2010   inverted hermia   HIP ARTHROPLASTY     JOINT REPLACEMENT Right 2005   hip   JOINT REPLACEMENT Bilateral    knee    KNEE ARTHROPLASTY Left 09/13/2013   Procedure: LEFT COMPUTER ASSISTED TOTAL KNEE ARTHROPLASTY;  Surgeon: Oneil JAYSON Herald, MD;  Location: [facility] OR;  Service: Orthopedics;  Laterality: Left;  Left Total Knee Arthroplasty, Computer Assist   KNEE ARTHROPLASTY Right 05/23/2014   Procedure: COMPUTER ASSISTED  TOTAL KNEE ARTHROPLASTY;  Surgeon: Oneil JAYSON Herald, MD;  Location: [facility] OR;  Service: Orthopedics;  Laterality: Right;   LUMBAR LAMINECTOMY/DECOMPRESSION MICRODISCECTOMY N/A 05/13/2016   Procedure: L4-5 Decompression;  Surgeon: Oneil JAYSON Herald, MD;  Location: [facility];  Service: Orthopedics;  Laterality: N/A;   RIGHT HEART CATH N/A 10/27/2021   Procedure: RIGHT HEART CATH;  Surgeon: Cherrie Toribio SAUNDERS, MD;  Location: [facility];  Service: Cardiovascular;  Laterality: N/A;   teeth etraction     TOTAL KNEE ARTHROPLASTY  Right 05/22/2014   dr barbarann   TUBAL LIGATION  1981   UPPER GI ENDOSCOPY     Social History   Occupational History   Not on file  Tobacco Use   Smoking status: Former    Current packs/day: 0.00    Average packs/day: 0.3 packs/day for 15.0 years (3.8 ttl pk-yrs)    Types: Cigarettes    Start date: 81    Quit date: 1999    Years since quitting: 26.0    Passive exposure: Past   Smokeless tobacco: Never  Vaping Use   Vaping status: Never Used  Substance and Sexual Activity   Alcohol use: Not Currently    Comment: 6 glasses of wine/month   Drug use: No   Sexual activity: Not on file

## 2023-05-18 NOTE — Telephone Encounter (Signed)
Spoke to pharmacy representative, who stated pt should be able to complete medication process by tomorrow

## 2023-05-29 ENCOUNTER — Telehealth: Payer: Self-pay

## 2023-05-29 ENCOUNTER — Encounter: Payer: Self-pay | Admitting: Gastroenterology

## 2023-05-29 NOTE — Telephone Encounter (Signed)
Lm on vm for patient to return call to schedule repeat EGD in April. MyChart message sent to patient as well.

## 2023-05-29 NOTE — Telephone Encounter (Signed)
Pt returned call. Front office scheduled patient for telephone PV on 07/19/23 at 1 pm and repeat EGD on 08/09/23 at 10 am.

## 2023-05-29 NOTE — Telephone Encounter (Signed)
-----   Message from Nurse [facility] sent at 05/11/2023 11:58 AM EST ----- Regarding: EGD in April Needs EGD - [facility] with Dr. Adela Lank Check ulcer healing

## 2023-05-30 ENCOUNTER — Other Ambulatory Visit: Payer: Self-pay | Admitting: *Deleted

## 2023-05-30 DIAGNOSIS — M359 Systemic involvement of connective tissue, unspecified: Secondary | ICD-10-CM

## 2023-05-31 ENCOUNTER — Encounter: Payer: Self-pay | Admitting: Internal Medicine

## 2023-06-01 ENCOUNTER — Ambulatory Visit (INDEPENDENT_AMBULATORY_CARE_PROVIDER_SITE_OTHER): Payer: Medicare Other | Admitting: Internal Medicine

## 2023-06-01 DIAGNOSIS — M359 Systemic involvement of connective tissue, unspecified: Secondary | ICD-10-CM | POA: Diagnosis not present

## 2023-06-01 DIAGNOSIS — J8489 Other specified interstitial pulmonary diseases: Secondary | ICD-10-CM

## 2023-06-01 NOTE — Progress Notes (Signed)
Spirometry and DLCO performed today.

## 2023-06-01 NOTE — Patient Instructions (Signed)
Spirometry and DLCO performed today.

## 2023-06-02 ENCOUNTER — Encounter: Payer: Self-pay | Admitting: Nurse Practitioner

## 2023-06-05 ENCOUNTER — Other Ambulatory Visit: Payer: Self-pay

## 2023-06-05 DIAGNOSIS — J329 Chronic sinusitis, unspecified: Secondary | ICD-10-CM

## 2023-06-05 MED ORDER — FLUTICASONE PROPIONATE 50 MCG/ACT NA SUSP: 2.0000 | Freq: Every day | NASAL | 6 refills | Status: DC

## 2023-06-09 ENCOUNTER — Encounter: Payer: Self-pay | Admitting: Nurse Practitioner

## 2023-06-09 ENCOUNTER — Other Ambulatory Visit: Payer: Self-pay | Admitting: Nurse Practitioner

## 2023-06-09 DIAGNOSIS — E66811 Obesity, class 1: Secondary | ICD-10-CM

## 2023-06-09 MED ORDER — TIRZEPATIDE-WEIGHT MANAGEMENT 2.5 MG/0.5ML ~~LOC~~ SOLN: 2.5000 mg | SUBCUTANEOUS | 6 refills | Status: DC

## 2023-06-12 ENCOUNTER — Other Ambulatory Visit: Payer: Self-pay | Admitting: Nurse Practitioner

## 2023-06-12 DIAGNOSIS — Z1231 Encounter for screening mammogram for malignant neoplasm of breast: Secondary | ICD-10-CM

## 2023-06-22 ENCOUNTER — Encounter: Payer: Self-pay | Admitting: Internal Medicine

## 2023-06-22 ENCOUNTER — Telehealth: Payer: Medicare Other | Admitting: Internal Medicine

## 2023-06-22 DIAGNOSIS — Z79624 Long term (current) use of inhibitors of nucleotide synthesis: Secondary | ICD-10-CM | POA: Diagnosis not present

## 2023-06-22 DIAGNOSIS — J8489 Other specified interstitial pulmonary diseases: Secondary | ICD-10-CM

## 2023-06-22 DIAGNOSIS — D849 Immunodeficiency, unspecified: Secondary | ICD-10-CM | POA: Diagnosis not present

## 2023-06-22 DIAGNOSIS — M359 Systemic involvement of connective tissue, unspecified: Secondary | ICD-10-CM

## 2023-06-22 NOTE — Patient Instructions (Addendum)
ICD-10-CM   1. Interstitial lung disease due to connective tissue disease (HCC)  J84.89    M35.9     2. Raynaud's disease without gangrene  I73.00     3. ANA positive  R76.8     4. 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 On Cellcept therapy  Z79.899     6. Immunosuppressed status (HCC)  D84.9     7. 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 Jon Billings at [facility] December 2024 and Dr. Marchelle Gearing February 2025  Plan - continue cellcept via Dr Deanne Coffer; 100 mg twice daily  -No need to escalate.  - monitor symptoms  -any worsening then consider adding new medications (ofev or Rituxan or increaseing cellcept)  - keep [place] posted about any respiratory infections - keep [facility] appt iwht Dr Jon Billings in June 2025 - do spirometry and dlco end of September 2025  Pulmonary Hypertension  - off  revatio  Plan - per  Dr Teressa Lower  5mm Lingular Nodule March 2023 -> no change MArch 2024 Does not meet criteria for lung cancer screening  Plan  - expectant followup    Followup - keep 10/30/23 visti with [facility] Dr Jon Billings  -end Sept 2025 after spiromery and dlco with Dr Marchelle Gearing;   -Pulmonary function test to be done on the same day as the visit in order to avoid patient anxiety  - symptoms score and sit/stand test at followup  - 15 min visit

## 2023-06-22 NOTE — Progress Notes (Signed)
OV 08/26/2021 - new ILD [facility]. Referred by Dr Pollyann Savoy  Subjective:  Patient ID: Chelsea Callahan, female , DOB: 20-Jul-1950 , age 73 y.o. , MRN: [id number] , ADDRESS: [address] [place] [address] PCP Elenore Paddy, NP Patient Care Team: Elenore Paddy, NP as PCP - General (Nurse Practitioner)  This Provider for this visit: Treatment Team:  Attending Provider: Kalman Shan, MD    08/26/2021 -   Chief Complaint  Patient presents with   Consult    Referred for possible ILD     HPI Chelsea Callahan 73 y.o. -referred by Dr Naida Sleight.  73 year old female.  She used to live in [place] and [place] and.  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  Then in 2013 she relocated to [facility] along with her husband.  This is for her retirement.  She tells me that approximately 13 years ago she developed Raynaud's symptoms.  She also started having some alopecia may be a year or 2 before that.  This then resulted in a positive ANA testing.  She then started following with a rheumatologist for 5 years before she relocated to [facility] and for the last 8 or 9 years has established care with Dr. Corliss Skains.  Her ANA here is also positive [see below].  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  She is followed periodically.  She is on Plaquenil.  She has arthritis but this is osteoarthritis.  She denies any scleroderma findings or rheumatoid arthritis findings of malar rash or oral ulcers or hematuria or other connective tissue disease findings beyond the Raynaud's.  During the course of routine follow-up this year she was discovered to have crackles.  The notes also confirmed this.  This resulted in a high-resolution CT chest that shows interstitial lung disease predominantly with a craniocaudal gradient.  Reticulation and some traction  bronchiectasis but no honeycombing.  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  Therefore she has been referred here.  She tells me that she prefers to be very aggressive with the treatment and management approach.  She has a life goal of living up to 90 years.  Currently she is able to walk 2 miles every 2 weeks.  She never stops.   She does not feel much short of breath.  Initially she has some chest tightness and this resolves.  Is not much of a cough.  Past medical history - Negative for any specific connective tissue disease.  Negative for hiatal hernia and acid reflux. - Positive for ANA + and low titer rheumatoid factor [discussion with Dr. Algis Downs today:: Undifferentiated connective tissue disease:] - She has had the COVID disease in January 2022.  She also had COVID-vaccine 4 shots.  Review of systems - Raynaud's present - Osteoarthritis present  Family history of pulmonary disease - Brother has myasthenia gravis but otherwise negative  Exposure history - Smoked from [age] although in between she stopped between 1979 and 1983.  3 to 5 cigarettes/day.  She did some marijuana as a teenager.  Otherwise no cocaine use or no intravenous drug use.  Home and Hobby details - [place]  She has lived there for 2 years.  Is a private home.  Detail organic antigen exposure history in this house is negative.  She is never used feather pillow or down jackets.  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  Detail organic and inorganic antigen exposure history at work is negative  Pulmonary toxicity history - Denies any adverse medication intake    AUOTIMMUNE   Latest Reference Range & Units 10/17/16 12:14 08/17/17 14:55 03/07/18 14:48 07/03/19 11:32 06/16/20 14:15 11/13/20 00:00 07/06/21 15:20  Anti Nuclear Antibody (ANA) NEGATIVE        POSITIVE !  ANA Pattern 1        Nuclear, Nucleolar !  ANA Titer 1 titer       1:1,280 (H)  ds DNA Ab  IU/mL 1 1 1 1 1 1 1   ENA SM Ab Ser-aCnc <1.0 NEG AI       <1.0 NEG  !: Data is abnormal (H): Data is abnormally high  Latest Reference Range & Units 07/06/21 15:20  Ribonucleic Protein(ENA) Antibody, IgG <1.0 NEG AI <1.0 NEG  ENA SM Ab Ser-aCnc <1.0 NEG AI <1.0 NEG  SSA (Ro) (ENA) Antibody, IgG <1.0 NEG AI <1.0 NEG  SSB (La) (ENA) Antibody, IgG <1.0 NEG AI <1.0 NEG  Scleroderma (Scl-70) (ENA) Antibody, IgG <1.0 NEG AI <1.0 NEG    Latest Reference Range & Units 10/17/16 12:14 08/17/17 14:55 03/07/18 14:48 07/03/19 11:32 06/16/20 14:15 11/13/20 00:00 07/06/21 15:20  Sed Rate 0 - 30 mm/h  9 1 6 6 9 11   Anticardiolipin Ab,IgA,Qn APL <11 <11       Anticardiolipin Ab,IgG,Qn GPL <14 <14       Anticardiolipin Ab,IgM,Qn MPL <12 <12        CT Chest data - HRCT March 2023  Narrative & Impression  CLINICAL DATA:  Autoimmune disease, crackles in chest.   EXAM: CT CHEST WITHOUT CONTRAST   TECHNIQUE: Multidetector CT imaging of the chest was performed following the standard protocol without intravenous contrast. High resolution imaging of the lungs, as well as inspiratory and expiratory imaging, was performed.   RADIATION DOSE REDUCTION: This exam was performed according to the departmental dose-optimization program which includes automated exposure control, adjustment of the mA and/or kV according to patient size and/or use of iterative reconstruction technique.   COMPARISON:  None.   FINDINGS: Cardiovascular: Atherosclerotic calcification of the aorta, aortic valve and coronary arteries. Enlarged right and left pulmonary arteries and heart. Small amount of pericardial fluid may be physiologic.   Mediastinum/Nodes: Mediastinal lymph nodes measure up to 10 mm in the AP window. Hilar regions are difficult to evaluate without IV contrast. Small axillary and subpectoral lymph nodes with the largest measuring 1.4 cm in the right axilla. Esophagus is grossly unremarkable.    Lungs/Pleura: Peripheral basilar subpleural reticulation, ground-glass and traction bronchiectasis/bronchiolectasis. No definitive honeycombing. Probable subpleural lymph nodes along the right major fissure, measuring up to 5 mm. Calcified granulomas. Noncalcified 5 mm lingular nodule (4 x 6 mm, 6/73). No pleural fluid. Airway is unremarkable. Mild air trapping.   Upper Abdomen: Visualized portion of the liver is unremarkable. Stone in the gallbladder. Visualized portions of the adrenal glands, kidneys, spleen and pancreas are grossly unremarkable. Gastric bypass.   Musculoskeletal: Degenerative changes in the spine. Old right rib fractures. No worrisome lytic or sclerotic lesions.   IMPRESSION: 1. Pulmonary parenchymal pattern of interstitial lung disease may be due to usual interstitial pneumonitis or nonspecific interstitial pneumonitis. Findings are categorized as probable UIP per consensus guidelines: Diagnosis of Idiopathic Pulmonary Fibrosis: An Official ATS/ERS/JRS/ALAT Clinical Practice Guideline. Am Rosezetta Schlatter Crit Care Med Vol  198, Iss 5, [id number], Dec 31 2016. 2. Small amount of pericardial fluid may be physiologic. 3. 5 mm lingular nodule. If the patient is high-risk, a non-contrast chest CT can be considered in 12 months.This recommendation follows the consensus statement: Guidelines for Management of Incidental Pulmonary Nodules Detected on CT Images: From the Fleischner Society 2017; Radiology 2017; 284:228-243. 4. Small to borderline enlarged mediastinal and axillary lymph nodes. Difficult to exclude a lymphoproliferative disorder. 5. Cholelithiasis. 6. Aortic atherosclerosis (ICD10-I70.0). Coronary artery calcification. 7. Enlarged pulmonary arteries, indicative of pulmonary arterial hypertension. 8.  Emphysema (ICD10-J43.9).     Electronically Signed   By: Leanna Battles M.D.   On: 07/19/2021 10:34         10/08/2021 Patient presents today for  follow-up.  She has a history of undifferentiated connective tissue disease.  She follows with rheumatology.  HRCT imaging in March showed pulmonary parenchymal pattern of interstitial lung disease, findings are categorized as probable UIP.  One antibody in myositis panel was positive.  Recently been placed on CellCept by Dr. Deanne Coffer with Rheumatology, awaiting results of hep panel before starting medication.  Pulmonary function testing today was normal.  Simple walk test without oxygen desaturation.  She is largely asymptomatic.  She walks on average 2 miles a day 5 times a week.  Only respiratory complaint is some dyspnea symptoms about halfway through her walk.  She has no significant shortness of breath, cough, chest tightness or wheezing.  Pulmonary function testing 10/08/2021 FVC 2.09 (107%), FEV1 1.87 (125%), ratio 90, TLC 81%, DLCOunc 102%  RHC 10/27/21  Findings:   RA = 8 RV = 45/11 PA = 45/13 (28) PCW = 17 Fick cardiac output/index = 5.2/2.9 PVR = 2.1 FA sat = 97% PA sat = 69%, 70% SVC sat = 66%   Assessment: 1. 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 Marchelle Gearing. Likely start macitentan    Arvilla Meres, MD  1:40 PM   OV 01/25/2022  Subjective:  Patient ID: Chelsea Callahan, female , DOB: March 20, 1951 , age 16 y.o. , MRN: [id number] , ADDRESS: [address] [place] [address] PCP Elenore Paddy, NP Patient Care Team: Elenore Paddy, NP as PCP - General (Nurse Practitioner)  This Provider for this visit: Treatment Team:  Attending Provider: Kalman Shan, MD    01/25/2022 -   Chief Complaint  Patient presents with   Follow-up    PFT performed 8/11 at [facility].  Pt states she has been doing okay since last visit and denies any complaints.   HPI Chelsea Callahan 73 y.o. -returns for follow-up.  Since I last personally met her she has now been started on CellCept after switching rheumatologist to [facility] Dr. Deanne Coffer.  This is on account of positive ANA and also positive myositis antibody and Raynaud's.  She is also had a right heart catheterization.  She has been started on room audio.  She continues to be very minimally symptomatic or asymptomatic from the shortness of breath cough and fatigue standpoint.  She she also wanted to get a second opinion for lung disease.  She is taken an appointment Dr. Marcelene Butte at [facility].  She did have pulmonary function test in August 2023.  Some except for admit as documented below.  However when she went there they only had a pulmonary function test scheduled but not the office appointment.  The office appointment is now  being scheduled for March 03, 2022 which is 3 months since the pulmonary function test.  Also at this time she is now on CellCept.  We took a shared decision making that she will have 1 more pulmonary function test it was the end of October 2023 to give a sequential timeline for Dr. Jon Billings to help with his decision making.  We discussed the fact that nintedanib would be the next agent and whether we will start it now or wait till if any progression.  This will be a question that would be post to Dr. Jon Billings.  The pulmonary function test data should help.  We discussed her respiratory vaccines but currently she wants to hold off.  Of note she has a 5 mm lingular nodule in March 2023.  She is a remote smoker.  We will get a repeat CT scan of the chest in spring or summer 2024.    PFT  OV 04/12/2022  Subjective:  Patient ID: Chelsea Callahan, female , DOB: 09/06/1950 , age 5 y.o. , MRN: [id number] , ADDRESS: [address] [place] [address] PCP Elenore Paddy, NP Patient Care Team: Elenore Paddy, NP as PCP - General (Nurse Practitioner)  This Provider for this visit: Treatment Team:  Attending Provider: Kalman Shan, MD    04/12/2022 -   Chief Complaint  Patient presents with   Follow-up    Pt  states she has been doing okay since last visit. States she has had an infection in her mouth that she states her dentist does not know how to treat.     HPI Chelsea Callahan 73 y.o. -returns for follow-up.  She continues on CellCept 100 mg twice daily.  After seeing me last time she had pulmonary function test that shows stability [see below].  She then saw Dr. Marcelene Butte.  He advised continuing current course with CellCept 5 mg twice daily and then consider add-on therapy.  They discussed biopsy.  But currently that was being withheld.  He also felt it was too early for lung transplant.  He did weigh in on adding a second drug of going up on CellCept but in balance they took a shared decision making to continue current course.  Currently she is content continuing the current course.  She has seen Dr. Deanne Coffer in rheumatology recently and had blood work for her CellCept and she said it is normal.  We again discussed vaccines but she declined.  I did emphasize the risk that comes with respiratory infections in the setting of immunosuppression pulmonary fibrosis but she prefers to defer vaccination.  I then advised her to stay away from human clusters especially during the fall and winter and avoid sick contacts.  She verbalized understanding.  We discussed continued serial monitoring.  She is most accepting of this plan.  She will continue her CellCept at the current dose.  We discussed about ways to monitor her symptoms and if there is any change in her shortness of breath between now and the next visit she will let us know.     OV 07/07/2022  Subjective:  Patient ID: Chelsea Callahan, female , DOB: [birth year] , age 55 y.o. , MRN: [id number] , ADDRESS: [address] [place] [address] PCP Elenore Paddy, NP Patient Care Team: Elenore Paddy, NP as PCP - General (Nurse Practitioner)  This Provider for this visit: Treatment Team:  Attending Provider: Kalman Shan, MD    07/07/2022 -   Chief  Complaint  Patient presents with   Follow-up    Ct f/u      HPI Chelsea Callahan 73 y.o. -returns for follow-up.  She continues to be immunosuppressed.  She takes CellCept regularly.  She feels stable minimal symptoms she walks 2 miles per day.  Her husband is here with her.  She is very anxious about his CT scan results which shows continued stability of her ILD.  In the same context her symptom score is unchanged and a walking desaturation test is stable.  Her last pulmonary function test was in October 2023.  She is really relieved about the result.  Of note she has a 5 mm lingular nodule and this is also stable.  She says she has upcoming dental surgery.  She has a dental implant that 13 or 72 years old.  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  She is being recommended extraction and then replacement at least partially.  She is nervous about this but she has noticed pus intermittently coming from her gums.  This past predates her starting CellCept.  I have indicated to her to hold the CellCept before and after the surgery to be extra cautious.  Did discuss the risk would be progressive ILD but the risk would be low given current stability.    OV 12/23/2022  Subjective:  Patient ID: Chelsea Callahan, female , DOB: Aug 14, 1950 , age 48 y.o. , MRN: [id number] , ADDRESS: [address] [place] [address] PCP Elenore Paddy, NP Patient Care Team: Elenore Paddy, NP as PCP - General (Nurse Practitioner)  This Provider for this visit: Treatment Team:  Attending Provider: Kalman Shan, MD    12/23/2022 -   Chief Complaint  Patient presents with   Follow-up    F/up on PFT,    Type of visit: Video Virtual Visit Identification of patient Chelsea Callahan with November 01, 1950 and MRN [id number] - 2 person identifier Risks: Risks, benefits, limitations of telephone visit explained. Patient understood and verbalized agreement to proceed Anyone else on call:  no Patient location: her home This provider location: [address], [address]; [place]; [place] [address]. [facility]. [phone]    HPI Chelsea Callahan 73 y.o. -returns for follow-up.  This is a video visit.  Last pulmonary function test was in June 2024 and showed a 10% decline in FVC.  She tells me overall for her activities of daily living she is stable but sometimes she feels she might be more short of breath.  Otherwise no Interim Health status: No new complaints No new medical problems. No new surgeries. No ER visits. No Urgent care visits.   Her upcoming appointment at [facility] ILD clinic is in the December 2024.  Of note she did see Dr. Gala Romney and she had an echocardiogram in June 2024 she had grade 2 grade 2 diastolic dysfunction.  Because of lack of subjective improvement he stopped of audio.  We discussed the most recent pulmonary function test and the decline.  She was about to get another pulmonary function test I had of this visit but this was not scheduled because of the backlog in our office.  She is very upset.  She feels that she is being victimized because of our office inefficiencies.  I initially discussed that it could be a 36-month wait for pulmonary function test.  She does not want to wait that long.  She feels she has a terminal disease.  Every chart to our  office scheduling to see if we can get her spirometry and DLCO done either at our office or in the hospital within the next few weeks.  Patient is okay with this plan.  If this is not possible I will have to look for alternate places where the pulmonary function test can be done.         CT Chest data from date: 07/05/22  - personally visualized and independently interpreted : NO - my findings are: none other than see offical report   IMPRESSION: 1. Today's examination is generally somewhat limited by breath motion artifact within this limitation, no obvious interval change in  mild pulmonary fibrosis in a pattern with apical to basal gradient, featuring irregular peripheral interstitial opacity, septal thickening, and small areas of subpleural bronchiolectasis at the lung bases without clear evidence of honeycombing. Findings are categorized as probable UIP per consensus guidelines: Diagnosis of Idiopathic Pulmonary Fibrosis: An Official ATS/ERS/JRS/ALAT Clinical Practice Guideline. Am Rosezetta Schlatter Crit Care Med Vol 198, Iss 5, [phone], Dec 31 2016. 2. Unchanged 0.5 cm nodule of the posterior lingula, benign, for which no further follow-up or characterization is required. 3. Cardiomegaly and coronary artery disease.   Aortic Atherosclerosis (ICD10-I70.0).     Electronically Signed   By: Jearld Lesch M.D.   On: 07/06/2022 15:51  OV 12/27/2022  Subjective:  Patient ID: Chelsea Callahan, female , DOB: 11/20/1950 , age 86 y.o. , MRN: [id number] , ADDRESS: [address] [place] [address] PCP Elenore Paddy, NP Patient Care Team: Elenore Paddy, NP as PCP - General (Nurse Practitioner)  This Provider for this visit: Treatment Team:  Attending Provider: Kalman Shan, MD    12/27/2022 -   Chief Complaint  Patient presents with   Follow-up    F/up on PFT   +.   HPI Chelsea Callahan 73 y.o. -+ returns for follow-up after her video visit last week.  Actually 4 days ago.  She has had PFTs.  Pulmonary function test continues to be normal.  It is very similar to June 2024.  Although normal it is a slight decline compared to 1 year ago.  1 year ago it was supranormal.  Her symptom scores appears to be range bound.  She continues to have significant amount of anxiety.  She has upcoming visit at [facility] April 11, 2023 with Dr. Marcelene Butte ILD clinic.  We discussed about just following with him in December.  With him he took a shared decision making to see her at the midpoint just a clinical visit to make sure the ILD is stable.   Therefore see her back in 7 weeks.  Noticed her anxiety scores are positive hence increase since late last year.  Her last the CMA to ask her about her interest in seeing a counselor.  I noticed that she is not on any SNRI or SSRI or anxiolytics.    OV 02/17/2023  Subjective:  Patient ID: Chelsea Callahan, female , DOB: 06/02/1950 , age 83 y.o. , MRN: [id number] , ADDRESS: [address] [place] [address] PCP Elenore Paddy, NP Patient Care Team: Elenore Paddy, NP as PCP - General (Nurse Practitioner)  This Provider for this visit: Treatment Team:  Attending Provider: Kalman Shan, MD    02/17/2023 -   Chief Complaint  Patient presents with   Follow-up    Pt denies any concerns, still on cellcept       HPI Chelsea Callahan 73 y.o. -ILD  patient.  She has upcoming appointment later this year and [facility] which is annual follow-up there.  This with Dr. Marcelene Butte.  At the last visit in August 2020 for there are some concern about pulmonary function test decline therefore we decided to do an interim visit right now with pulmonary function test but for some reason her pulmonary function test was not scheduled.  I do not know why.  I apologize for this.  In any event subjectively she feels the same.  We did a quick exercise sit/stand hypoxemia test and there is no desaturation.  We resolved for her to visit with Dr. Jon Billings and #2024 but see me back in 4 months which would be approximately 3 months after she sees him with a pulmonary function test.  She is of REVATIO     OV 06/22/2023     Subjective:  Patient ID: Chelsea Callahan, female , DOB: March 20, 1951 , age 47 y.o. , MRN: [id number] , ADDRESS: [address] [place] [address] PCP Elenore Paddy, NP Patient Care Team: Elenore Paddy, NP as PCP - General (Nurse Practitioner)  This Provider for this visit: Treatment Team:  Attending Provider: Kalman Shan, MD    #Undifferentiated  connective tissue disease  -Myositis antibody negative's 2023 - Raynaud's + -ANA +2023 -On CellCept through Dr. Deanne Coffer since October 05, 2021 -Also on Plaquenil -[employer] second opinion Dr. 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  - Last HRCT 07/05/22  -Crackles on presentation  -Very mild burden.  Essentially asymptomatic normal PFTs 2023  #Mild pulmonary hypertension right heart cath June 2023 --Dr. Gala Romney  -Pulmonary capillary wedge pressure 17, PVR 2.1, PA mean 28  -Revatio started by Dr. Gala Romney fper history  #5 mm lingula nodule March 2023 -> march 2024 without change  - stopped smoking 1999 (in betewee 3 years no smoking)  - strted smoking 1972/1973  -2-4 cigs  per day (habitual but mild)  - total 2.7 ppy smoking hisotryy  #Grade 2 diastolic dysfunction on echocardiogram June 2024.  06/22/2023 -  No chief complaint on file.  Type of visit: Video Virtual Visit Identification of patient Chelsea Callahan with 07-05-1950 and MRN [id number] - 2 person identifier Risks: Risks, benefits, limitations of telephone visit explained. Patient understood and verbalized agreement to proceed Anyone else on call: just patient Patient location: She is on a mobile device and in [place].  The video feed did not come despite trying 2 times. This provider location: [address]; [place]; [place] [address]. [facility]. [phone]   HPI Chelsea Callahan 73 y.o. -returns for follow-up.  Since her last visit with me she saw Dr. Marcelene Butte December 2024.  He felt she was stable.  The consensus was to leave her on 5 mg twice daily of CellCept and not make any change.  She continues to report stability.  She had pulmonary function test end of January 2025 and it is stable.  She told me that she is quite anxious waiting for the results of the pulmonary function test and she likes her early release on the results.  I.  She says  that at [facility] they do both on the same day which is the visit in the PFT.  She is agreeable to having both the visit and the PFT here on the same day.  Her next visit at [facility] is in June 2025 [10/30/2023].  We talked about  spacing herself adequately between visits and we will see her in September 2025.  She is okay with this plan.  She will do pulmonary function test on the same day as a visit with me in order to reduce the anxiety burden of waiting for the results.  But she does know in the interim if there is a problem she will call me.      SYMPTOM SCALE - ILD 01/25/2022 01/25/2022  04/12/2022  07/07/2022  12/27/2022  02/17/2023   Current weight        O2 use ra ra ra ra ra ra  Shortness of Breath 0 -> 5 scale with 5 being worst (score 6 If unable to do)       At rest 000 0 0 0 0 0  Simple tasks - showers, clothes change, eating, shaving 0 0 0 0 0 0  Household (dishes, doing bed, laundry) 0 0 1 0 1 0  Shopping 00 0 1 0 1 00  Walking level at own pace 0 0 1 0 1 0  Walking up Stairs 0  1+ 1.5 1 0.5  Total (30-36) Dyspnea Score 0 00 4 1.5 4 0.5  How bad is your cough? 0 0 0 0 0 0  How bad is your fatigue 0 0 1 0 No sutre 0.5  How bad is nausea 0 0 0 0 1 0  How bad is vomiting?  0 0 0 0 0 0  How bad is diarrhea? 0 0 0 0 0 0  How bad is anxiety? 0 0 2 4 4 1   How bad is depression 2 2 1 1  0.5 0  Any chronic pain - if so where and how bad 1 1 x x x       Simple office walk 185 feet x  3 laps goal with forehead probe 08/26/2021  10/08/2021  07/07/2022  12/27/2022  02/17/2023   O2 used ra RA ra ra ra  Number laps completed C2 of 3 3 3  Sit stand x 15 Sit/stand times 10 times  Comments about pace x normal     Resting Pulse Ox/HR 100% and 83/min 100% and 58/min 100% and HR 72 100% and HR 79 100% with heart rate of 70  Final Pulse Ox/HR 100% and 130/min 100% and 133/min 100% and HR 137 99% aHR 88 100% with a heart rate of 103  Desaturated </= 88% no No     Desaturated <=  3% points no No     Got Tachycardic >/= 90/min yes Yes     Symptoms at end of test x x  No dyspnea   Miscellaneous comments x x         PFT     Latest Ref Rng & Units 06/01/2023   11:20 AM 12/27/2022   10:50 AM 10/19/2022    2:52 PM 02/28/2022    2:21 PM 10/08/2021   10:54 AM  PFT Results  FVC-Pre L  1.99  1.92  2.14  2.14   FVC-Predicted Pre % 78  P 73  70  84  110   FVC-Post L     2.09   FVC-Predicted Post %     107   Pre FEV1/FVC % % 85  P 90  86  90  90   Post FEV1/FCV % %     90   FEV1-Pre L 1.80  P 1.79  1.64  1.93  1.93   FEV1-Predicted Pre % 87  P  87  80  101  129   FEV1-Post L     1.87   DLCO uncorrected ml/min/mmHg 15.70  P 15.40  15.75  17.35  17.27   DLCO UNC% % 86  P 85  87  102  102   DLCO corrected ml/min/mmHg 15.70  P 15.40  15.75  18.39  17.27   DLCO COR %Predicted % 86  P 85  87  108  102   DLVA Predicted % 100  P 106  106  112  104   TLC L     3.64   TLC % Predicted %     81   RV % Predicted %     63     P Preliminary result       LAB RESULTS last 96 hours No results found.       has a past medical history of Anemia, 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HCC), Blood transfusion without reported diagnosis, Cataract, GERD (gastroesophageal reflux disease), Lung disease (2022), Primary osteoarthritis of right knee, Raynaud disease, Raynaud disease, Spinal headache, and Vasculitis (HCC).   reports that she quit smoking about 26 years ago. Her smoking use included cigarettes. She started smoking about 41 years ago. She has a 3.8 pack-year smoking history. She has been exposed to tobacco smoke. She has never used smokeless tobacco.  Past Surgical History:  Procedure Laterality Date   abdominal clip     to stomach post gastric bypass   AXILLARY LYMPH NODE BIOPSY Right 01/27/2021   Procedure: RIGHT AXILLARY LYMPH NODE BIOPSY;  Surgeon: Harriette Bouillon, MD;  Location: [facility];  Service: General;  Laterality: Right;   CESAREAN SECTION     x 2    cortisone injection Right 12/2022   shoulder   GASTRIC BYPASS     HERNIA REPAIR  2010   inverted hermia   HIP ARTHROPLASTY     JOINT REPLACEMENT Right 2005   hip   JOINT REPLACEMENT Bilateral    knee    KNEE ARTHROPLASTY Left 09/13/2013   Procedure: LEFT COMPUTER ASSISTED TOTAL KNEE ARTHROPLASTY;  Surgeon: Eldred Manges, MD;  Location: [facility];  Service: Orthopedics;  Laterality: Left;  Left Total Knee Arthroplasty, Computer Assist   KNEE ARTHROPLASTY Right 05/23/2014   Procedure: COMPUTER ASSISTED TOTAL KNEE ARTHROPLASTY;  Surgeon: Eldred Manges, MD;  Location: [facility];  Service: Orthopedics;  Laterality: Right;   LUMBAR LAMINECTOMY/DECOMPRESSION MICRODISCECTOMY N/A 05/13/2016   Procedure: L4-5 Decompression;  Surgeon: Eldred Manges, MD;  Location[facility];  Service: Orthopedics;  Laterality: N/A;   RIGHT HEART CATH N/A 10/27/2021   Procedure: RIGHT HEART CATH;  Surgeon: Dolores Patty, MD;  Location: [facility];  Service: Cardiovascular;  Laterality: N/A;   teeth etraction     TOTAL KNEE ARTHROPLASTY Right 05/22/2014   dr Ophelia Charter   TUBAL LIGATION  1981   UPPER GI ENDOSCOPY      Allergies  Allergen Reactions   Ginger Swelling and Other (See Comments)    Stomach swells   Pork-Derived Products Other (See Comments)    UNSPECIFIED "PERSONAL REASONS"   Shellfish Allergy Other (See Comments)    UNSPECIFIED "Personal reasons/dietary"    Immunization History  Administered Date(s) Administered   PFIZER Comirnaty(Gray Top)Covid-19 Tri-Sucrose Vaccine 05/21/2019, 06/11/2019, 04/05/2020, 10/13/2020   PFIZER(Purple Top)SARS-COV-2 Vaccination 05/21/2019, 06/11/2019, 04/05/2020   Zoster Recombinant(Shingrix) 03/08/2018, 05/08/2018   Zoster, Unspecified 03/08/2018, 05/08/2018    Family History  Problem Relation Age of Onset  Cancer Brother    Multiple myeloma Brother    Colon cancer Neg Hx    Esophageal cancer Neg Hx    Rectal cancer Neg Hx    Stomach cancer Neg Hx    Colon  polyps Neg Hx      Current Outpatient Medications:    aspirin EC 81 MG tablet, Take 81 mg by mouth daily. Swallow whole., Disp: , Rfl:    Cholecalciferol (CVS D3) 25 MCG (1000 UT) capsule, Take 1,000 Units by mouth daily., Disp: , Rfl:    Fe Bisgly-Succ-C-Thre-B12-FA (IRON-150 PO), Take 1 tablet by mouth every other day., Disp: , Rfl:    fluticasone (FLONASE) 50 MCG/ACT nasal spray, Place 2 sprays into both nostrils daily., Disp: 16 g, Rfl: 6   hydroxychloroquine (PLAQUENIL) 200 MG tablet, TAKE 1 TABLET BY MOUTH EVERY DAY, Disp: 90 tablet, Rfl: 0   Moringa Oleifera (MORINGA PO), Take by mouth., Disp: , Rfl:    Multiple Vitamin (MULTIVITAMIN WITH MINERALS) TABS tablet, Take 1 tablet by mouth daily., Disp: , Rfl:    mycophenolate (CELLCEPT) 500 MG tablet, Take 500 mg by mouth 2 (two) times daily., Disp: , Rfl:    NON FORMULARY, [place], Disp: , Rfl:    omeprazole (PRILOSEC) 40 MG capsule, Take 1 capsule (40 mg total) by mouth 2 (two) times daily before a meal., Disp: 180 capsule, Rfl: 3   OVER THE COUNTER MEDICATION, Take 1 capsule by mouth daily. Sea Moss, Disp: , Rfl:    psyllium (METAMUCIL) 58.6 % packet, Take 1 packet by mouth daily as needed (constipation)., Disp: , Rfl:    tirzepatide (ZEPBOUND) 2.5 MG/0.5ML injection vial, Inject 2.5 mg into the skin once a week., Disp: 2 mL, Rfl: 6      Objective:   There were no vitals filed for this visit.  Estimated body mass index is 32.01 kg/m as calculated from the following:   Height as of 05/16/23: 5\' 2"  (1.575 m).   Weight as of 05/16/23: 175 lb (79.4 kg).  @WEIGHTCHANGE @  There were no vitals filed for this visit.   Physical Exam   General: No distress.  Sounded normal on the voice        Assessment:       ICD-10-CM   1. Interstitial lung disease due to connective tissue disease (HCC)  J84.89    M35.9     2. On Cellcept therapy  Z79.624     3. Immunosuppressed status (HCC)  D84.9          Plan:     Patient  Instructions     ICD-10-CM   1. Interstitial lung disease due to connective tissue disease (HCC)  J84.89    M35.9     2. Raynaud's disease without gangrene  I73.00     3. ANA positive  R76.8     4. 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 On Cellcept therapy  Z79.899     6. Immunosuppressed status (HCC)  D84.9     7. Vaccine counseling  Z71.85      Interstitial lung disease due to connective tissue disease (HCC) Raynaud's disease without gangrene ANA positive Myositis associated antibody positive On Cellcept therapy Immunosuppressed status (HCC)   - tolerating well cellcept 500mg  twice daily since May/June 2023 via Dr Deanne Coffer - 2nd opinion at [facility] Dr Jon Billings - Nov 2023: continue current course -ILD stable based on visit with Dr. Jon Billings at [facility] December 2024 and Dr. Marchelle Gearing February 2025  Plan - continue cellcept via Dr Deanne Coffer; 100 mg twice daily  -No need to escalate.  - monitor symptoms  -any worsening then consider adding new medications (ofev or Rituxan or increaseing cellcept)  - keep [place] posted about any respiratory infections - keep [facility] appt iwht Dr Jon Billings in June 2025 - do spirometry and dlco end of September 2025  Pulmonary Hypertension  - off  revatio  Plan - per  Dr Teressa Lower  5mm Lingular Nodule March 2023 -> no change MArch 2024 Does not meet criteria for lung cancer screening  Plan  - expectant followup    Followup - keep 10/30/23 visti with [facility] Dr Jon Billings  -end Sept 2025 after spiromery and dlco with Dr Marchelle Gearing;   -Pulmonary function test to be done on the same day as the visit in order to avoid patient anxiety  - symptoms score and sit/stand test at followup  - 15 min visit   FOLLOWUP Return in about 7 months (around 01/20/2024) for 15 min visit, after Cleda Daub and DLCO, with Dr Marchelle Gearing, Face to Face Visit.    SIGNATURE    Dr. Kalman Shan, M.D., F.C.C.P,  Pulmonary and Critical Care Medicine Staff  Physician, [facility] Director - Interstitial Lung Disease  Program  Pulmonary Fibrosis [facility] at [facility] [place], [place], [address]  Pager: [phone], If no answer or between  15:00h - 7:00h: call [phone] Telephone: [phone]  4:57 PM 06/22/2023

## 2023-06-27 LAB — PULMONARY FUNCTION TEST
DL/VA % pred: 100 %
DL/VA: 4.2 ml/min/mmHg/L
DLCO cor % pred: 86 %
DLCO cor: 15.7 ml/min/mmHg
DLCO unc % pred: 86 %
DLCO unc: 15.7 ml/min/mmHg
FEF 25-75 Pre: 2.29 L/s
FEF2575-%Pred-Pre: 131 %
FEV1-%Pred-Pre: 87 %
FEV1-Pre: 1.8 L
FEV1FVC-%Pred-Pre: 111 %
FEV6-%Pred-Pre: 81 %
FEV6-Pre: 2.12 L
FEV6FVC-%Pred-Pre: 104 %
FVC-%Pred-Pre: 78 %
Pre FEV1/FVC ratio: 85 %
Pre FEV6/FVC Ratio: 100 %

## 2023-06-28 DIAGNOSIS — M1990 Unspecified osteoarthritis, unspecified site: Secondary | ICD-10-CM | POA: Diagnosis not present

## 2023-06-28 DIAGNOSIS — I7300 Raynaud's syndrome without gangrene: Secondary | ICD-10-CM | POA: Diagnosis not present

## 2023-06-28 DIAGNOSIS — Z79899 Other long term (current) drug therapy: Secondary | ICD-10-CM | POA: Diagnosis not present

## 2023-06-28 DIAGNOSIS — M359 Systemic involvement of connective tissue, unspecified: Secondary | ICD-10-CM | POA: Diagnosis not present

## 2023-06-28 DIAGNOSIS — R768 Other specified abnormal immunological findings in serum: Secondary | ICD-10-CM | POA: Diagnosis not present

## 2023-06-28 DIAGNOSIS — J849 Interstitial pulmonary disease, unspecified: Secondary | ICD-10-CM | POA: Diagnosis not present

## 2023-06-29 ENCOUNTER — Ambulatory Visit
Admission: RE | Admit: 2023-06-29 | Discharge: 2023-06-29 | Disposition: A | Discharge status: Home / Self Care | Payer: Medicare Other | Source: Ambulatory Visit | Attending: Nurse Practitioner | Admitting: Nurse Practitioner

## 2023-06-29 DIAGNOSIS — Z1231 Encounter for screening mammogram for malignant neoplasm of breast: Secondary | ICD-10-CM

## 2023-07-04 ENCOUNTER — Encounter: Payer: Self-pay | Admitting: Nurse Practitioner

## 2023-07-12 DIAGNOSIS — H2513 Age-related nuclear cataract, bilateral: Secondary | ICD-10-CM | POA: Diagnosis not present

## 2023-07-12 DIAGNOSIS — D899 Disorder involving the immune mechanism, unspecified: Secondary | ICD-10-CM | POA: Diagnosis not present

## 2023-07-12 DIAGNOSIS — Z79899 Other long term (current) drug therapy: Secondary | ICD-10-CM | POA: Diagnosis not present

## 2023-07-12 DIAGNOSIS — H04123 Dry eye syndrome of bilateral lacrimal glands: Secondary | ICD-10-CM | POA: Diagnosis not present

## 2023-07-19 ENCOUNTER — Ambulatory Visit (AMBULATORY_SURGERY_CENTER): Payer: Medicare Other

## 2023-07-19 VITALS — Ht 62.0 in | Wt 168.0 lb

## 2023-07-19 DIAGNOSIS — K253 Acute gastric ulcer without hemorrhage or perforation: Secondary | ICD-10-CM

## 2023-07-19 NOTE — Progress Notes (Signed)
 No egg or soy allergy known to patient  No issues known to pt with past sedation with any surgeries or procedures Patient denies ever being told they had issues or difficulty with intubation  No FH of Malignant Hyperthermia Pt is on diet pills, Zepbound, hold instructions provided Pt is not on  home 02  Pt is not on blood thinners  Pt denies issues with constipation  No A fib or A flutter Have any cardiac testing pending--No Pt can ambulate  Pt denies use of chewing tobacco Discussed diabetic I weight loss medication holds Discussed NSAID holds Checked BMI Pt instructed to use Singlecare.com or GoodRx for a price reduction on prep  Patient's chart reviewed by Cathlyn Parsons CNRA prior to previsit and patient appropriate for the [facility].  Pre visit completed and red dot placed by patient's name on their procedure day (on provider's schedule).

## 2023-08-06 ENCOUNTER — Encounter: Payer: Self-pay | Admitting: Certified Registered Nurse Anesthetist

## 2023-08-09 ENCOUNTER — Encounter: Payer: Self-pay | Admitting: Gastroenterology

## 2023-08-09 ENCOUNTER — Ambulatory Visit: Payer: Medicare Other | Admitting: Gastroenterology

## 2023-08-09 VITALS — BP 142/83 | HR 66 | Temp 97.2°F | Resp 13 | Ht 62.0 in | Wt 168.0 lb

## 2023-08-09 DIAGNOSIS — K31A0 Gastric intestinal metaplasia, unspecified: Secondary | ICD-10-CM | POA: Diagnosis not present

## 2023-08-09 DIAGNOSIS — K449 Diaphragmatic hernia without obstruction or gangrene: Secondary | ICD-10-CM

## 2023-08-09 DIAGNOSIS — K3189 Other diseases of stomach and duodenum: Secondary | ICD-10-CM | POA: Diagnosis not present

## 2023-08-09 DIAGNOSIS — Z8711 Personal history of peptic ulcer disease: Secondary | ICD-10-CM

## 2023-08-09 DIAGNOSIS — K2950 Unspecified chronic gastritis without bleeding: Secondary | ICD-10-CM | POA: Diagnosis not present

## 2023-08-09 DIAGNOSIS — T182XXA Foreign body in stomach, initial encounter: Secondary | ICD-10-CM

## 2023-08-09 DIAGNOSIS — K319 Disease of stomach and duodenum, unspecified: Secondary | ICD-10-CM | POA: Diagnosis not present

## 2023-08-09 DIAGNOSIS — Q396 Congenital diverticulum of esophagus: Secondary | ICD-10-CM | POA: Diagnosis not present

## 2023-08-09 DIAGNOSIS — Z980 Intestinal bypass and anastomosis status: Secondary | ICD-10-CM

## 2023-08-09 MED ORDER — SODIUM CHLORIDE 0.9 % IV SOLN: 500.0000 mL | INTRAVENOUS | Status: DC

## 2023-08-09 NOTE — Progress Notes (Signed)
1105 Robinul 0.1 mg IV given due large amount of secretions upon assessment.  MD made aware, vss 

## 2023-08-09 NOTE — Progress Notes (Signed)
 Called to room to assist during endoscopic procedure.  Patient ID and intended procedure confirmed with present staff. Received instructions for my participation in the procedure from the performing physician.

## 2023-08-09 NOTE — Progress Notes (Signed)
 Report given to PACU, vss

## 2023-08-09 NOTE — Progress Notes (Signed)
 [facility] History and Physical   Primary Care Physician:  Elenore Paddy, NP   Reason for Procedure:   History of gastric ulcer, GIM  Plan:    EGD     HPI: Chelsea Callahan is a 73 y.o. 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 Biopsies show no H pylori, but she did have GIM. On omeprazole 40mg  BID. She is feeling much better on omeprazole since the last visit.    Otherwise feels well without any cardiopulmonary symptoms.   I have discussed risks / benefits of anesthesia and endoscopic procedure with Whitman Hero and they wish to proceed with the exams as outlined today.    Past Medical History:  Diagnosis Date   Anemia    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HCC)    Blood transfusion without reported diagnosis    Cataract    bil/ per pt   GERD (gastroesophageal reflux disease)    Lung disease 2022   Primary osteoarthritis of right knee    Raynaud disease    Spinal headache    c-section   Vasculitis (HCC)    on prednisone -     Past Surgical History:  Procedure Laterality Date   abdominal clip     to stomach post gastric bypass   AXILLARY LYMPH NODE BIOPSY Right 01/27/2021   Procedure: RIGHT AXILLARY LYMPH NODE BIOPSY;  Surgeon: Harriette Bouillon, MD;  Location: [facility];  Service: General;  Laterality: Right;   CESAREAN SECTION     x 2   cortisone injection Right 12/2022   shoulder   GASTRIC BYPASS     HERNIA REPAIR  2010   inverted hermia   HIP ARTHROPLASTY     JOINT REPLACEMENT Right 2005   hip   JOINT REPLACEMENT Bilateral    knee    KNEE ARTHROPLASTY Left 09/13/2013   Procedure: LEFT COMPUTER ASSISTED TOTAL KNEE ARTHROPLASTY;  Surgeon: Eldred Manges, MD;  Location: [facility];  Service: Orthopedics;  Laterality: Left;  Left Total Knee Arthroplasty, Computer Assist   KNEE ARTHROPLASTY Right 05/23/2014   Procedure: COMPUTER ASSISTED  TOTAL KNEE ARTHROPLASTY;  Surgeon: Eldred Manges, MD;  Location: [facility];  Service: Orthopedics;  Laterality: Right;   LUMBAR LAMINECTOMY/DECOMPRESSION MICRODISCECTOMY N/A 05/13/2016   Procedure: L4-5 Decompression;  Surgeon: Eldred Manges, MD;  [facility];  Service: Orthopedics;  Laterality: N/A;   RIGHT HEART CATH N/A 10/27/2021   Procedure: RIGHT HEART CATH;  Surgeon: Dolores Patty, MD;  [facility];  Service: Cardiovascular;  Laterality: N/A;   teeth etraction     TOTAL KNEE ARTHROPLASTY Right 05/22/2014   dr Ophelia Charter   TUBAL LIGATION  1981   UPPER GI ENDOSCOPY      Prior to Admission medications   Medication Sig Start Date End Date Taking? Authorizing Provider  aspirin EC 81 MG tablet Take 81 mg by mouth daily. Swallow whole.   Yes [provider]  Cholecalciferol (CVS D3) 25 MCG (1000 UT) capsule Take 1,000 Units by mouth daily.   Yes [provider]  Fe Bisgly-Succ-C-Thre-B12-FA (IRON-150 PO) Take 1 tablet by mouth every other day.   Yes [provider]  hydroxychloroquine (PLAQUENIL) 200 MG tablet TAKE 1 TABLET BY MOUTH EVERY DAY 10/01/21  Yes Gearldine Bienenstock, PA-C  Moringa Oleifera (MORINGA PO) Take by mouth.   Yes [provider]  Multiple Vitamin (MULTIVITAMIN WITH  MINERALS) TABS tablet Take 1 tablet by mouth daily.   Yes [provider]  mycophenolate (CELLCEPT) 500 MG tablet Take 500 mg by mouth 2 (two) times daily.   Yes [provider]  NON FORMULARY [place]   Yes [provider]  omeprazole (PRILOSEC) 40 MG capsule Take 1 capsule (40 mg total) by mouth 2 (two) times daily before a meal. 05/08/23  Yes Shifra Swartzentruber, Willaim Rayas, MD  OVER THE COUNTER MEDICATION Take 1 capsule by mouth daily. Sea Moss   Yes [provider]  fluticasone (FLONASE) 50 MCG/ACT nasal spray Place 2 sprays into both nostrils daily. 06/05/23   Elenore Paddy, NP  psyllium (METAMUCIL) 58.6 % packet Take 1 packet by mouth daily  as needed (constipation). Patient not taking: Reported on 07/19/2023    [provider]  tirzepatide (ZEPBOUND) 2.5 MG/0.5ML injection vial Inject 2.5 mg into the skin once a week. 06/09/23   Elenore Paddy, NP    Current Outpatient Medications  Medication Sig Dispense Refill   aspirin EC 81 MG tablet Take 81 mg by mouth daily. Swallow whole.     Cholecalciferol (CVS D3) 25 MCG (1000 UT) capsule Take 1,000 Units by mouth daily.     Fe Bisgly-Succ-C-Thre-B12-FA (IRON-150 PO) Take 1 tablet by mouth every other day.     hydroxychloroquine (PLAQUENIL) 200 MG tablet TAKE 1 TABLET BY MOUTH EVERY DAY 90 tablet 0   Moringa Oleifera (MORINGA PO) Take by mouth.     Multiple Vitamin (MULTIVITAMIN WITH MINERALS) TABS tablet Take 1 tablet by mouth daily.     mycophenolate (CELLCEPT) 500 MG tablet Take 500 mg by mouth 2 (two) times daily.     NON FORMULARY [place]     omeprazole (PRILOSEC) 40 MG capsule Take 1 capsule (40 mg total) by mouth 2 (two) times daily before a meal. 180 capsule 3   OVER THE COUNTER MEDICATION Take 1 capsule by mouth daily. Sea Moss     fluticasone (FLONASE) 50 MCG/ACT nasal spray Place 2 sprays into both nostrils daily. 16 g 6   psyllium (METAMUCIL) 58.6 % packet Take 1 packet by mouth daily as needed (constipation). (Patient not taking: Reported on 07/19/2023)     tirzepatide (ZEPBOUND) 2.5 MG/0.5ML injection vial Inject 2.5 mg into the skin once a week. 2 mL 6   Current Facility-Administered Medications  Medication Dose Route Frequency Provider Last Rate Last Admin   0.9 %  sodium chloride infusion  500 mL Intravenous Continuous Alcie Runions, Willaim Rayas, MD        Allergies as of 08/09/2023 - Review Complete 08/09/2023  Allergen Reaction Noted   Ginger Swelling and Other (See Comments) 09/03/2013   Pork-derived products Other (See Comments) 05/13/2014   Shellfish allergy Other (See Comments) 05/13/2014    Family History  Problem Relation Age of Onset   Cancer Brother     Multiple myeloma Brother    Colon cancer Neg Hx    Esophageal cancer Neg Hx    Rectal cancer Neg Hx    Stomach cancer Neg Hx    Colon polyps Neg Hx     Social History   Socioeconomic History   Marital status: Married    Spouse name: Not on file   Number of children: Not on file   Years of education: Not on file   Highest education level: Not on file  Occupational History   Not on file  Tobacco Use   Smoking status: Former    Current  packs/day: 0.00    Average packs/day: 0.3 packs/day for 15.0 years (3.8 ttl pk-yrs)    Types: Cigarettes    Start date: 46    Quit date: 1999    Years since quitting: 26.2    Passive exposure: Past   Smokeless tobacco: Never  Vaping Use   Vaping status: Never Used  Substance and Sexual Activity   Alcohol use: Yes    Comment: wine rarely   Drug use: No   Sexual activity: Not on file  Other Topics Concern   Not on file  Social History Narrative   Not on file   Social Drivers of Health   Financial Resource Strain: Not on file  Food Insecurity: Not on file  Transportation Needs: Not on file  Physical Activity: Not on file  Stress: Not on file  Social Connections: Not on file  Intimate Partner Violence: Not on file    Review of Systems: All other review of systems negative except as mentioned in the HPI.  Physical Exam: Vital signs BP 137/68   Pulse 66   Temp (!) 97.2 F (36.2 C) (Temporal)   Ht 5\' 2"  (1.575 m)   Wt 168 lb (76.2 kg)   SpO2 100%   BMI 30.73 kg/m   General:   Alert,  Well-developed, pleasant and cooperative in NAD Lungs:  Clear throughout to auscultation.   Heart:  Regular rate and rhythm Abdomen:  Soft, nontender and nondistended.   Neuro/Psych:  Alert and cooperative. Normal mood and affect. A and O x 3  Harlin Rain, MD [facility]

## 2023-08-09 NOTE — Patient Instructions (Signed)

## 2023-08-09 NOTE — Progress Notes (Signed)
 Pt's states no medical or surgical changes since previsit or office visit.

## 2023-08-09 NOTE — Op Note (Signed)
  [facility] Patient Name: Chelsea Callahan Procedure Date: 08/09/2023 10:59 AM MRN: [id number] Endoscopist: Viviann Spare P. 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 Gastric and duodenal                            ulcer noted on EGD January, negative for H pylori                            but positive for GIM. On omeprazole twice daily                            with resolution of symptoms. Medicines:                Monitored Anesthesia Care Procedure:                Pre-Anesthesia Assessment:                           - Prior to the procedure, a History and Physical                            was performed, and patient medications and                            allergies were reviewed. The patient's tolerance of                            previous anesthesia was also reviewed. The risks                            and benefits of the procedure and the sedation                            options and risks were discussed with the patient.                            All questions were answered, and informed consent                            was obtained. Prior Anticoagulants: The patient has                            taken no anticoagulant or antiplatelet agents. ASA                            Grade Assessment: II - A patient with mild systemic  disease. After reviewing the risks and benefits,                            the patient was deemed in satisfactory condition to                            undergo the procedure.                           After obtaining informed consent, the endoscope was                            passed under direct vision. Throughout the                            procedure, the  patient's blood pressure, pulse, and                            oxygen saturations were monitored continuously. The                            Olympus scope [id number] was introduced through the                            mouth, and advanced to the second part of duodenum.                            The upper GI endoscopy was accomplished without                            difficulty. The patient tolerated the procedure                            well. Scope In: Scope Out: Findings:                 The Z-line was regular and was found 33 cm from the                            incisors.                           A small hiatal hernia was present.                           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                           Evidence of a Roux-en-Y gastrojejunostomy was                            found. The gastrojejunal anastomosis was  characterized by healthy appearing mucosa. The                            gastric pouch was small. The anatomy was distorted.                            At the anastomosis the lumen on the right is a                            blind limb, the lumen on the lower left is the                            small bowel jejunal limb. The lumen on the upper                            left is abnormal - fistula going into the bypassed                            stomach.                           Retained food (residue) was found in the gastric                            body and in the gastric antrum. Short exam done                            given this finding, no retroflexed views obtained,                            but low risk for aspiration given this would not be                            easily able to reflux back into gastric pouch.                           The exam of the stomach was otherwise normal.                           Biopsies were taken  with a cold forceps for                            histology, assess for GIM.                           A single protuberant area was found in the second                            portion of the duodenum, suspect prominent ampulla.                            Biopsies were taken with a cold forceps for  histology.                           The exam of the duodenum was otherwise normal.                           The examined jejunal / small bowel limb was normal. Complications:            No immediate complications. Estimated blood loss:                            Minimal. Estimated Blood Loss:     Estimated blood loss was minimal. Impression:               - Z-line regular, 33 cm from the incisors.                           - Small hiatal hernia.                           - Diverticulum in the lower third of the esophagus.                           - Roux-en-Y gastrojejunostomy with gastrojejunal                            anastomosis characterized by healthy appearing                            mucosa. Altered anatomy as outlined.                           - Food (residue) in the remnant stomach. Biopsied                            to rule out GIM based on prior biopsy results.                           - Suspected prominent ampulla in the duodenum.                            Biopsied.                           - Normal examined jejunal limb.                           Interval healing of gastric / duodenal ulcers on                            twice daily omeprazole. Recommendation:           - Patient has a contact number available for                            emergencies. The signs and symptoms of potential  delayed complications were discussed with the                            patient. Return to normal activities tomorrow.                            Written discharge instructions were provided to the                             patient.                           - Resume previous diet.                           - Continue present medications.                           - Reduce omeprazole to once daily.                           - Await pathology results. Viviann Spare P. Taylon Coole, MD 08/09/2023 11:27:34 AM This report has been signed electronically.

## 2023-08-10 ENCOUNTER — Telehealth: Payer: Self-pay

## 2023-08-10 NOTE — Telephone Encounter (Signed)
  Follow up Call-     08/09/2023   10:26 AM 05/08/2023    9:48 AM  Call back number  Post procedure Call Back phone  # [phone]  Permission to leave phone message Yes Yes     Patient questions:  Do you have a fever, pain , or abdominal swelling? No. Pain Score  0 *  Have you tolerated food without any problems? Yes.    Have you been able to return to your normal activities? Yes.    Do you have any questions about your discharge instructions: Diet   No. Medications  No. Follow up visit  No.  Do you have questions or concerns about your Care? No.  Actions: * If pain score is 4 or above: No action needed, pain <4.

## 2023-08-16 LAB — SURGICAL PATHOLOGY

## 2023-08-18 ENCOUNTER — Encounter: Payer: Self-pay | Admitting: Gastroenterology

## 2023-09-02 ENCOUNTER — Encounter: Payer: Self-pay | Admitting: Nurse Practitioner

## 2023-09-02 ENCOUNTER — Encounter: Payer: Self-pay | Admitting: Internal Medicine

## 2023-09-07 ENCOUNTER — Other Ambulatory Visit: Payer: Self-pay | Admitting: Nurse Practitioner

## 2023-09-07 DIAGNOSIS — E66811 Obesity, class 1: Secondary | ICD-10-CM

## 2023-09-07 MED ORDER — TIRZEPATIDE-WEIGHT MANAGEMENT 2.5 MG/0.5ML ~~LOC~~ SOLN: 2.5000 mg | SUBCUTANEOUS | 6 refills | Status: DC

## 2023-09-29 ENCOUNTER — Encounter: Payer: Self-pay | Admitting: Physician Assistant

## 2023-09-29 ENCOUNTER — Ambulatory Visit (INDEPENDENT_AMBULATORY_CARE_PROVIDER_SITE_OTHER): Admitting: Physician Assistant

## 2023-09-29 DIAGNOSIS — M7541 Impingement syndrome of right shoulder: Secondary | ICD-10-CM

## 2023-09-29 MED ORDER — LIDOCAINE HCL 1 % IJ SOLN
5.0000 mL | INTRAMUSCULAR | Status: AC | PRN
  Administered 2023-09-29: 5 mL

## 2023-09-29 MED ORDER — METHYLPREDNISOLONE ACETATE 40 MG/ML IJ SUSP
40.0000 mg | INTRAMUSCULAR | Status: AC | PRN
  Administered 2023-09-29: 40 mg via INTRA_ARTICULAR

## 2023-09-29 NOTE — Progress Notes (Signed)
 Office Visit Note   Patient: Chelsea Callahan           Date of Birth: November 22, 1950           MRN: [id number] Visit Date: 09/29/2023              Requested by: Zorita Hiss, NP [address] [place],  [place] [address] PCP: Zorita Hiss, NP      HPI: Patient is a pleasant 73 year old woman who is a former patient of Dr. Murrel Arnt.  She has a history of impingement syndrome of her right shoulder.  Assessment & Plan: Visit Diagnoses: Impingement syndrome right shoulder  Plan: May follow-up as needed  Follow-Up Instructions: No follow-ups on file.   Ortho Exam  Patient is alert, oriented, no adenopathy, well-dressed, normal affect, normal respiratory effort. Right shoulder she has good forward elevation internal rotation behind her back positive impingement findings no redness no erythema she is neurovascularly intact Narcotics: .CHLMME  Imaging: No results found. No images are attached to the encounter.  Labs: Lab Results  Component Value Date   ESRSEDRATE 11 07/06/2021   ESRSEDRATE 9 11/13/2020   ESRSEDRATE 6 06/16/2020     Lab Results  Component Value Date   ALBUMIN 4.5 07/28/2022   ALBUMIN 3.8 01/26/2021   ALBUMIN 4.2 10/17/2016    No results found for: "MG" Lab Results  Component Value Date   VD25OH 47.08 08/01/2022   VD25OH 54.44 01/28/2022    No results found for: "PREALBUMIN"    Latest Ref Rng & Units 07/28/2022    3:47 PM 01/28/2022    1:42 PM 10/27/2021    1:30 PM  CBC EXTENDED  WBC 4.0 - 10.5 K/uL 6.0  5.5    RBC 3.87 - 5.11 Mil/uL 4.32  4.18    Hemoglobin 12.0 - 15.0 g/dL 19.1  47.8  9.9   HCT 29.5 - 46.0 % 37.3  36.2  29.0   Platelets 150.0 - 400.0 K/uL 262.0  255.0       There is no height or weight on file to calculate BMI.  Orders:  No orders of the defined types were placed in this encounter.  No orders of the defined types were placed in this encounter.    Procedures: Large Joint Inj on 09/29/2023 11:07 AM Indications:  diagnostic evaluation Details: 25 G 1.5 in needle, posterior approach  Arthrogram: No  Medications: 5 mL lidocaine  1 %; 40 mg methylPREDNISolone  acetate 40 MG/ML Outcome: tolerated well, no immediate complications Procedure, treatment alternatives, risks and benefits explained, specific risks discussed. Consent was given by the patient.    Clinical Data: No additional findings.  ROS:  All other systems negative, except as noted in the HPI. Review of Systems  Objective: Vital Signs: There were no vitals taken for this visit.  Specialty Comments:  No specialty comments available.  PMFS History: Patient Active Problem List   Diagnosis Date Noted  . Impingement syndrome of right shoulder 05/17/2023  . Sinusitis 05/12/2023  . Lower leg mass, right 09/16/2022  . Pulmonary hypertension (HCC) 09/16/2022  . Class 1 obesity with serious comorbidity and body mass index (BMI) of 32.0 to 32.9 in adult 09/16/2022  . Hyperlipidemia 09/16/2022  . Chronic cough 05/19/2022  . Estrogen deficiency 05/19/2022  . Osteopenia 01/28/2022  . Raynaud phenomenon 10/22/2021  . Connective tissue disease (HCC) 10/08/2021  . Onychodystrophy 09/24/2021  . Axillary mass, right 09/24/2021  . Anxiety 09/24/2021  . Anemia 08/13/2021  . Chronic pain  of both knees 12/27/2017  . Pain in left hand 12/27/2017  . History of lumbar laminectomy for spinal cord decompression 12/27/2017  . Lumbar stenosis 05/13/2016  . High risk medication use 05/11/2016  . Primary osteoarthritis of both feet 05/11/2016  . 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  . 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  . Low back pain 03/09/2016  . Spinal stenosis of lumbar region 03/09/2016  . Osteoarthritis of right knee 05/27/2014  . Autoimmune disease (HCC) 09/24/2013  . Rheumatoid arthritis (HCC) 09/24/2013  . Osteoarthritis of left knee 09/13/2013    Class: Diagnosis of   Past Medical History:  Diagnosis Date  . Anemia   . Auto  immune neutropenia (HCC)   . Blood transfusion without reported diagnosis   . Cataract    bil/ per pt  . GERD (gastroesophageal reflux disease)   . Lung disease 2022  . Primary osteoarthritis of right knee   . Raynaud disease   . Spinal headache    c-section  . Vasculitis (HCC)    on prednisone  -     Family History  Problem Relation Age of Onset  . Cancer Brother   . Multiple myeloma Brother   . Colon cancer Neg Hx   . Esophageal cancer Neg Hx   . Rectal cancer Neg Hx   . Stomach cancer Neg Hx   . Colon polyps Neg Hx     Past Surgical History:  Procedure Laterality Date  . abdominal clip     to stomach post gastric bypass  . AXILLARY LYMPH NODE BIOPSY Right 01/27/2021   Procedure: RIGHT AXILLARY LYMPH NODE BIOPSY;  Surgeon: Sim Dryer, MD;  Location: [facility];  Service: General;  Laterality: Right;  . CESAREAN SECTION     x 2  . cortisone injection Right 12/2022   shoulder  . GASTRIC BYPASS    . HERNIA REPAIR  2010   inverted hermia  . HIP ARTHROPLASTY    . JOINT REPLACEMENT Right 2005   hip  . JOINT REPLACEMENT Bilateral    knee   . KNEE ARTHROPLASTY Left 09/13/2013   Procedure: LEFT COMPUTER ASSISTED TOTAL KNEE ARTHROPLASTY;  Surgeon: Adah Acron, MD;  Location: [facility] OR;  Service: Orthopedics;  Laterality: Left;  Left Total Knee Arthroplasty, Computer Assist  . KNEE ARTHROPLASTY Right 05/23/2014   Procedure: COMPUTER ASSISTED TOTAL KNEE ARTHROPLASTY;  Surgeon: Adah Acron, MD;  Location: [facility] OR;  Service: Orthopedics;  Laterality: Right;  . LUMBAR LAMINECTOMY/DECOMPRESSION MICRODISCECTOMY N/A 05/13/2016   Procedure: L4-5 Decompression;  Surgeon: Adah Acron, MD;  Location: [facility] OR;  Service: Orthopedics;  Laterality: N/A;  . RIGHT HEART CATH N/A 10/27/2021   Procedure: RIGHT HEART CATH;  Surgeon: Mardell Shade, MD;  Location: [facility];  Service: Cardiovascular;  Laterality: N/A;  . teeth etraction    . TOTAL KNEE ARTHROPLASTY  Right 05/22/2014   dr Murrel Arnt  . TUBAL LIGATION  1981  . UPPER GI ENDOSCOPY     Social History   Occupational History  . Not on file  Tobacco Use  . Smoking status: Former    Current packs/day: 0.00    Average packs/day: 0.3 packs/day for 15.0 years (3.8 ttl pk-yrs)    Types: Cigarettes    Start date: 37    Quit date: 1999    Years since quitting: 26.4    Passive exposure: Past  . Smokeless tobacco: Never  Vaping Use  . Vaping status: Never Used  Substance  and Sexual Activity  . Alcohol use: Yes    Comment: wine rarely  . Drug use: No  . Sexual activity: Not on file

## 2023-10-25 DIAGNOSIS — R768 Other specified abnormal immunological findings in serum: Secondary | ICD-10-CM | POA: Diagnosis not present

## 2023-10-25 DIAGNOSIS — M1990 Unspecified osteoarthritis, unspecified site: Secondary | ICD-10-CM | POA: Diagnosis not present

## 2023-10-25 DIAGNOSIS — I7300 Raynaud's syndrome without gangrene: Secondary | ICD-10-CM | POA: Diagnosis not present

## 2023-10-25 DIAGNOSIS — M359 Systemic involvement of connective tissue, unspecified: Secondary | ICD-10-CM | POA: Diagnosis not present

## 2023-10-25 DIAGNOSIS — J849 Interstitial pulmonary disease, unspecified: Secondary | ICD-10-CM | POA: Diagnosis not present

## 2023-10-25 DIAGNOSIS — Z79899 Other long term (current) drug therapy: Secondary | ICD-10-CM | POA: Diagnosis not present

## 2023-10-30 DIAGNOSIS — Z87891 Personal history of nicotine dependence: Secondary | ICD-10-CM | POA: Diagnosis not present

## 2023-10-30 DIAGNOSIS — J8410 Pulmonary fibrosis, unspecified: Secondary | ICD-10-CM | POA: Diagnosis not present

## 2023-10-30 DIAGNOSIS — Z79624 Long term (current) use of inhibitors of nucleotide synthesis: Secondary | ICD-10-CM | POA: Diagnosis not present

## 2023-10-30 DIAGNOSIS — R0602 Shortness of breath: Secondary | ICD-10-CM | POA: Diagnosis not present

## 2023-10-30 DIAGNOSIS — I2723 Pulmonary hypertension due to lung diseases and hypoxia: Secondary | ICD-10-CM | POA: Diagnosis not present

## 2023-10-30 DIAGNOSIS — R0989 Other specified symptoms and signs involving the circulatory and respiratory systems: Secondary | ICD-10-CM | POA: Diagnosis not present

## 2023-10-30 DIAGNOSIS — R600 Localized edema: Secondary | ICD-10-CM | POA: Diagnosis not present

## 2023-10-30 DIAGNOSIS — L659 Nonscarring hair loss, unspecified: Secondary | ICD-10-CM | POA: Diagnosis not present

## 2023-10-30 DIAGNOSIS — J84111 Idiopathic interstitial pneumonia, not otherwise specified: Secondary | ICD-10-CM | POA: Diagnosis not present

## 2023-10-30 DIAGNOSIS — I7300 Raynaud's syndrome without gangrene: Secondary | ICD-10-CM | POA: Diagnosis not present

## 2023-10-30 DIAGNOSIS — Z79899 Other long term (current) drug therapy: Secondary | ICD-10-CM | POA: Diagnosis not present

## 2023-11-04 ENCOUNTER — Telehealth: Admitting: Nurse Practitioner

## 2023-11-04 DIAGNOSIS — B379 Candidiasis, unspecified: Secondary | ICD-10-CM | POA: Diagnosis not present

## 2023-11-04 DIAGNOSIS — T3695XA Adverse effect of unspecified systemic antibiotic, initial encounter: Secondary | ICD-10-CM

## 2023-11-04 DIAGNOSIS — R399 Unspecified symptoms and signs involving the genitourinary system: Secondary | ICD-10-CM | POA: Diagnosis not present

## 2023-11-04 MED ORDER — SULFAMETHOXAZOLE-TRIMETHOPRIM 800-160 MG PO TABS: 1.0000 | ORAL_TABLET | Freq: Two times a day (BID) | ORAL | 0 refills | Status: AC

## 2023-11-04 MED ORDER — FLUCONAZOLE 150 MG PO TABS: 150.0000 mg | ORAL_TABLET | Freq: Once | ORAL | 0 refills | Status: AC

## 2023-11-04 NOTE — Progress Notes (Signed)
 E-Visit for Urinary Problems  We are sorry that you are not feeling well.  Here is how we plan to help!  Based on what you shared with me it looks like you most likely have a simple urinary tract infection.  A UTI (Urinary Tract Infection) is a bacterial infection of the bladder.  Most cases of urinary tract infections are simple to treat but a key part of your care is to encourage you to drink plenty of fluids and watch your symptoms carefully.  I have prescribed Bactrim  DS One tablet twice a day for 3 days and diflucan  for yeast for 1 day.  Your symptoms should gradually improve. Call us  if the burning in your urine worsens, you develop worsening fever, back pain or pelvic pain or if your symptoms do not resolve after completing the antibiotic.  Urinary tract infections can be prevented by drinking plenty of water to keep your body hydrated.  Also be sure when you wipe, wipe from front to back and don't hold it in!  If possible, empty your bladder every 4 hours.  HOME CARE Drink plenty of fluids Compete the full course of the antibiotics even if the symptoms resolve Remember, when you need to go.go. Holding in your urine can increase the likelihood of getting a UTI! GET HELP RIGHT AWAY IF: You cannot urinate You get a high fever Worsening back pain occurs You see blood in your urine You feel sick to your stomach or throw up You feel like you are going to pass out  MAKE SURE YOU  Understand these instructions. Will watch your condition. Will get help right away if you are not doing well or get worse.   Thank you for choosing an e-visit.  Your e-visit answers were reviewed by a board certified advanced clinical practitioner to complete your personal care plan. Depending upon the condition, your plan could have included both over the counter or prescription medications.  Please review your pharmacy choice. Make sure the pharmacy is open so you can pick up prescription now. If there  is a problem, you may contact your provider through [employer] and have the prescription routed to another pharmacy.  Your safety is important to us . If you have drug allergies check your prescription carefully.   For the next 24 hours you can use MyChart to ask questions about today's visit, request a non-urgent call back, or ask for a work or school excuse. You will get an email in the next two days asking about your experience. I hope that your e-visit has been valuable and will speed your recovery.

## 2023-11-04 NOTE — Progress Notes (Signed)
 I have spent 5 minutes in review of e-visit questionnaire, review and updating patient chart, medical decision making and response to patient.   Claiborne Rigg, NP

## 2023-12-27 ENCOUNTER — Telehealth: Payer: Self-pay | Admitting: Internal Medicine

## 2023-12-27 ENCOUNTER — Telehealth: Payer: Self-pay

## 2023-12-27 DIAGNOSIS — J8489 Other specified interstitial pulmonary diseases: Secondary | ICD-10-CM

## 2023-12-27 NOTE — Telephone Encounter (Signed)
Order linked to appointment.

## 2023-12-27 NOTE — Telephone Encounter (Signed)
 Order for Spiro needed for PT's upcoming appt. Thanks.

## 2023-12-27 NOTE — Telephone Encounter (Signed)
 Copied from CRM #[id number]. Topic: Clinical - Request for Lab/Test Order >> Dec 27, 2023 10:56 AM Russell PARAS wrote: Reason for CRM:   Pt reports she has a CT scan yearly and was attempting to schedule. Advised pt there was no order in chart and would be unable to schedule. Pt requested order be places so that she can have performed prior to her FU visit with Ramaswamy.  CB#  [phone]   Spoke w/ patient and looked into re guards and did not see anything that states to have a yearly CT last one was done in 2024 LOV note no mentions then she said it was a MD from [facility] that said she should have yearly ones.    -NFN

## 2023-12-27 NOTE — Telephone Encounter (Signed)
 Ordered spiro/dlco  Thanks

## 2024-01-04 ENCOUNTER — Ambulatory Visit

## 2024-01-04 ENCOUNTER — Encounter: Payer: Self-pay | Admitting: Nurse Practitioner

## 2024-01-04 VITALS — Ht 62.0 in | Wt 168.0 lb

## 2024-01-04 DIAGNOSIS — Z0000 Encounter for general adult medical examination without abnormal findings: Secondary | ICD-10-CM

## 2024-01-04 NOTE — Progress Notes (Signed)
 Subjective:   Chelsea Callahan is a 73 y.o. who presents for a Medicare Wellness preventive visit.  As a reminder, Annual Wellness Visits don't include a physical exam, and some assessments may be limited, especially if this visit is performed virtually. We may recommend an in-person follow-up visit with your provider if needed.  Visit Complete: Virtual I connected with  Chelsea Callahan on 01/04/24 by a video and audio enabled telemedicine application and verified that I am speaking with the correct person using two identifiers.  Patient Location: Home  Provider Location: Office/Clinic  I discussed the limitations of evaluation and management by telemedicine. The patient expressed understanding and agreed to proceed.  Vital Signs: Because this visit was a virtual/telehealth visit, some criteria may be missing or patient reported. Any vitals not documented were not able to be obtained and vitals that have been documented are patient reported.  Persons Participating in Visit: Patient.  AWV Questionnaire: Yes: Patient Medicare AWV questionnaire was completed by the patient on 01/03/2024; I have confirmed that all information answered by patient is correct and no changes since this date.  Cardiac Risk Factors include: advanced age (>33men, >71 women);hypertension;dyslipidemia     Objective:    Today's Vitals   01/04/24 1551  Weight: 168 lb (76.2 kg)  Height: 5' 2 (1.575 m)   Body mass index is 30.73 kg/m.     01/04/2024    4:03 PM 10/27/2021   11:43 AM 01/27/2021    8:45 AM 05/05/2016    1:40 PM 06/24/2014    3:28 PM 05/23/2014    3:42 PM 05/13/2014   10:24 AM  Advanced Directives  Does Patient Have a Medical Advance Directive? Yes Yes Yes No  Yes  Yes  No   Type of Special educational needs teacher of [employer] Power of [place];Living will  Healthcare Power of [place];Living will     Does patient want to make changes to medical advance directive?  No -  Guardian declined No - Patient declined  No - Patient declined  No - Patient declined    Copy of Healthcare Power of Attorney in Chart?  No - copy requested No - copy requested  No - copy requested     Would patient like information on creating a medical advance directive?    No - Patient declined         Data saved with a previous flowsheet row definition    Current Medications (verified) Outpatient Encounter Medications as of 01/04/2024  Medication Sig   aspirin  EC 81 MG tablet Take 81 mg by mouth daily. Swallow whole.   Cholecalciferol ([employer] D3) 25 MCG (1000 UT) capsule Take 1,000 Units by mouth daily.   Fe Bisgly-Succ-C-Thre-B12-FA (IRON-150 PO) Take 1 tablet by mouth every other day.   fluticasone  (FLONASE ) 50 MCG/ACT nasal spray Place 2 sprays into both nostrils daily.   hydroxychloroquine  (PLAQUENIL ) 200 MG tablet TAKE 1 TABLET BY MOUTH EVERY DAY   Moringa Oleifera (MORINGA PO) Take by mouth.   Multiple Vitamin (MULTIVITAMIN WITH MINERALS) TABS tablet Take 1 tablet by mouth daily.   mycophenolate (CELLCEPT) 500 MG tablet Take 500 mg by mouth 2 (two) times daily.   NON FORMULARY [place]   omeprazole  (PRILOSEC) 40 MG capsule Take 1 capsule (40 mg total) by mouth 2 (two) times daily before a meal.   OVER THE COUNTER MEDICATION Take 1 capsule by mouth daily. [place]   psyllium (METAMUCIL) 58.6 % packet Take 1 packet by mouth daily  as needed (constipation). (Patient not taking: Reported on 07/19/2023)   tirzepatide  (ZEPBOUND ) 2.5 MG/0.5ML injection vial Inject 2.5 mg into the skin once a week.   No facility-administered encounter medications on file as of 01/04/2024.    Allergies (verified) Ginger, Pork-derived products, and Shellfish allergy   History: Past Medical History:  Diagnosis Date   Anemia    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HCC)    Blood transfusion without reported diagnosis    Cataract    bil/ per pt   GERD (gastroesophageal reflux disease)    Lung disease 2022   Primary  osteoarthritis of right knee    Raynaud disease    Spinal headache    c-section   Vasculitis (HCC)    on prednisone  -    Past Surgical History:  Procedure Laterality Date   abdominal clip     to stomach post gastric bypass   AXILLARY LYMPH NODE BIOPSY Right 01/27/2021   Procedure: RIGHT AXILLARY LYMPH NODE BIOPSY;  Surgeon: Vanderbilt Ned, MD;  Location: [facility];  Service: General;  Laterality: Right;   CESAREAN SECTION     x 2   cortisone injection Right 12/2022   shoulder   GASTRIC BYPASS     HERNIA REPAIR  2010   inverted hermia   HIP ARTHROPLASTY     JOINT REPLACEMENT Right 2005   hip   JOINT REPLACEMENT Bilateral    knee    KNEE ARTHROPLASTY Left 09/13/2013   Procedure: LEFT COMPUTER ASSISTED TOTAL KNEE ARTHROPLASTY;  Surgeon: Oneil JAYSON Herald, MD;  Location: [facility] OR;  Service: Orthopedics;  Laterality: Left;  Left Total Knee Arthroplasty, Computer Assist   KNEE ARTHROPLASTY Right 05/23/2014   Procedure: COMPUTER ASSISTED TOTAL KNEE ARTHROPLASTY;  Surgeon: Oneil JAYSON Herald, MD;  Location: [facility] OR;  Service: Orthopedics;  Laterality: Right;   LUMBAR LAMINECTOMY/DECOMPRESSION MICRODISCECTOMY N/A 05/13/2016   Procedure: L4-5 Decompression;  Surgeon: Oneil JAYSON Herald, MD;  Location: [facility] OR;  Service: Orthopedics;  Laterality: N/A;   RIGHT HEART CATH N/A 10/27/2021   Procedure: RIGHT HEART CATH;  Surgeon: Cherrie Toribio SAUNDERS, MD;  Location: [facility];  Service: Cardiovascular;  Laterality: N/A;   teeth etraction     TOTAL KNEE ARTHROPLASTY Right 05/22/2014   dr herald   TUBAL LIGATION  1981   UPPER GI ENDOSCOPY     Family History  Problem Relation Age of Onset   Cancer Brother    Multiple myeloma Brother    Colon cancer Neg Hx    Esophageal cancer Neg Hx    Rectal cancer Neg Hx    Stomach cancer Neg Hx    Colon polyps Neg Hx    Social History   Socioeconomic History   Marital status: Married    Spouse name: Not on file   Number of children: 6   Years  of education: Not on file   Highest education level: Master's degree (e.g., MA, MS, MEng, MEd, MSW, MBA)  Occupational History   Occupation: [employer] RETIRED  Tobacco Use   Smoking status: Former    Current packs/day: 0.00    Average packs/day: 0.3 packs/day for 15.0 years (3.8 ttl pk-yrs)    Types: Cigarettes    Start date: 60    Quit date: 1999    Years since quitting: 26.6    Passive exposure: Past   Smokeless tobacco: Never  Vaping Use   Vaping status: Never Used  Substance and Sexual Activity   Alcohol use: Yes  Comment: wine rarely   Drug use: No   Sexual activity: Not on file  Other Topics Concern   Not on file  Social History Narrative   Lives with husband/2025   Social Drivers of Health   Financial Resource Strain: Low Risk  (01/03/2024)   Overall Financial Resource Strain (CARDIA)    Difficulty of Paying Living Expenses: Not hard at all  Food Insecurity: No Food Insecurity (01/03/2024)   Hunger Vital Sign    Worried About Running Out of Food in the Last Year: Never true    Ran Out of Food in the Last Year: Never true  Transportation Needs: No Transportation Needs (01/03/2024)   PRAPARE - Administrator, Civil Service (Medical): No    Lack of Transportation (Non-Medical): No  Physical Activity: Sufficiently Active (01/03/2024)   Exercise Vital Sign    Days of Exercise per Week: 6 days    Minutes of Exercise per Session: 70 min  Stress: Patient Declined (01/03/2024)   [employer] of Occupational Health - Occupational Stress Questionnaire    Feeling of Stress: Patient declined  Social Connections: Socially Integrated (01/03/2024)   Social Connection and Isolation Panel    Frequency of Communication with Friends and Family: Three times a week    Frequency of Social Gatherings with Friends and Family: More than three times a week    Attends Religious Services: More than 4 times per year    Active Member of [employer] or Organizations: Yes    Attends Museum/gallery exhibitions officer: More than 4 times per year    Marital Status: Married    Tobacco Counseling Counseling given: Not Answered    Clinical Intake:  Pre-visit preparation completed: Yes  Pain : No/denies pain     BMI - recorded: 30.73 Nutritional Status: BMI > 30  Obese Nutritional Risks: None Diabetes: No  No results found for: HGBA1C   How often do you need to have someone help you when you read instructions, pamphlets, or other written materials from your doctor or pharmacy?: 1 - Never  Interpreter Needed?: No  Information entered by :: Margueritte Guthridge Yus, RMA   Activities of Daily Living     01/03/2024    3:48 PM  In your present state of health, do you have any difficulty performing the following activities:  Hearing? 0  Vision? 0  Difficulty concentrating or making decisions? 0  Walking or climbing stairs? 0  Dressing or bathing? 0  Doing errands, shopping? 0  Preparing Food and eating ? N  Using the Toilet? N  In the past six months, have you accidently leaked urine? N  Do you have problems with loss of bowel control? N  Managing your Medications? N  Managing your Finances? N  Housekeeping or managing your Housekeeping? N    Patient Care Team: Elnor Lauraine BRAVO, NP as PCP - General (Nurse Practitioner)  I have updated your Care Teams any recent Medical Services you may have received from other providers in the past year.     Assessment:   This is a routine wellness examination for Chelsea Callahan.  Hearing/Vision screen Hearing Screening - Comments:: Denies hearing difficulties   Vision Screening - Comments:: Wears eyeglasses/ [place]   Goals Addressed               This Visit's Progress     Patient Stated (pt-stated)        Stay living/2025       Depression Screen  01/04/2024    4:05 PM 09/16/2022    3:05 PM 05/19/2022    3:11 PM 05/04/2022    4:13 PM 01/28/2022    1:15 PM 10/22/2021    1:06 PM 09/24/2021    1:11 PM  PHQ 2/9 Scores  PHQ - 2  Score 0 0 0 0 0 0 4  PHQ- 9 Score 0  0 0 0 1 6    Fall Risk     01/03/2024    3:48 PM 09/16/2022    3:05 PM 05/19/2022    3:11 PM 05/04/2022    4:12 PM 01/28/2022    1:15 PM  Fall Risk   Falls in the past year? 0 0 0 0 1  Number falls in past yr:  0 0 0 0  Injury with Fall?  0 0 0 0  Risk for fall due to :  No Fall Risks No Fall Risks No Fall Risks History of fall(s)  Follow up Falls evaluation completed;Falls prevention discussed Falls evaluation completed Falls evaluation completed  Falls evaluation completed  Falls evaluation completed      Data saved with a previous flowsheet row definition    MEDICARE RISK AT HOME:  Medicare Risk at Home Any stairs in or around the home?: (Patient-Rptd) Yes If so, are there any without handrails?: (Patient-Rptd) No Home free of loose throw rugs in walkways, pet beds, electrical cords, etc?: (Patient-Rptd) Yes Adequate lighting in your home to reduce risk of falls?: (Patient-Rptd) Yes Life alert?: (Patient-Rptd) No Use of a cane, walker or w/c?: (Patient-Rptd) No Grab bars in the bathroom?: (Patient-Rptd) Yes Shower chair or bench in shower?: (Patient-Rptd) No Elevated toilet seat or a handicapped toilet?: (Patient-Rptd) No  TIMED UP AND GO:  Was the test performed?  No  Cognitive Function: Declined/Normal: No cognitive concerns noted by patient or family. Patient alert, oriented, able to answer questions appropriately and recall recent events. No signs of memory loss or confusion.        Immunizations Immunization History  Administered Date(s) Administered   PFIZER Comirnaty(Gray Top)Covid-19 Tri-Sucrose Vaccine 05/21/2019, 06/11/2019, 04/05/2020, 10/13/2020   PFIZER(Purple Top)SARS-COV-2 Vaccination 05/21/2019, 06/11/2019, 04/05/2020   Zoster Recombinant(Shingrix) 03/08/2018, 05/08/2018   Zoster, Unspecified 03/08/2018, 05/08/2018    Screening Tests Health Maintenance  Topic Date Due   Hepatitis C Screening  Never done    DTaP/Tdap/Td (1 - Tdap) Never done   Fecal DNA (Cologuard)  Never done   Medicare Annual Wellness (AWV)  03/05/2022   INFLUENZA VACCINE  Never done   MAMMOGRAM  06/28/2025   DEXA SCAN  Completed   Zoster Vaccines- Shingrix  Completed   HPV VACCINES  Aged Out   Meningococcal B Vaccine  Aged Out   Pneumococcal Vaccine: 50+ Years  Discontinued   COVID-19 Vaccine  Discontinued    Health Maintenance  Health Maintenance Due  Topic Date Due   Hepatitis C Screening  Never done   DTaP/Tdap/Td (1 - Tdap) Never done   Fecal DNA (Cologuard)  Never done   Medicare Annual Wellness (AWV)  03/05/2022   INFLUENZA VACCINE  Never done   Health Maintenance Items Addressed: See Nurse Notes at the end of this note  Additional Screening:  Vision Screening: Recommended annual ophthalmology exams for early detection of glaucoma and other disorders of the eye. Would you like a referral to an eye doctor? No    Dental Screening: Recommended annual dental exams for proper oral hygiene  Community Resource Referral / Chronic Care Management: CRR required  this visit?  No   CCM required this visit?  No   Plan:    I have personally reviewed and noted the following in the patient's chart:   Medical and social history Use of alcohol, tobacco or illicit drugs  Current medications and supplements including opioid prescriptions. Patient is not currently taking opioid prescriptions. Functional ability and status Nutritional status Physical activity Advanced directives List of other physicians Hospitalizations, surgeries, and ER visits in previous 12 months Vitals Screenings to include cognitive, depression, and falls Referrals and appointments  In addition, I have reviewed and discussed with patient certain preventive protocols, quality metrics, and best practice recommendations. A written personalized care plan for preventive services as well as general preventive health recommendations were  provided to patient.   Willy Vorce L Nikhita Mentzel, CMA   01/04/2024   After Visit Summary: (MyChart) Due to this being a telephonic visit, the after visit summary with patients personalized plan was offered to patient via MyChart   Notes: Patient declines all vaccines at this time.  She stated that she is waiting on receiving her Cologuard kit in the mail.  Patient also stated that she has had a Hep C screening around 2 years ago in a lab not affiliated with [facility].  She had no other concerns to address today.

## 2024-01-04 NOTE — Patient Instructions (Signed)
 Chelsea Callahan , Thank you for taking time out of your busy schedule to complete your Annual Wellness Visit with me. I enjoyed our conversation and look forward to speaking with you again next year. I, as well as your care team,  appreciate your ongoing commitment to your health goals. Please review the following plan we discussed and let me know if I can assist you in the future. Your Game plan/ To Do List     Follow up Visits: We will see or speak with you next year for your Next Medicare AWV with our clinical staff Have you seen your provider in the last 6 months (3 months if uncontrolled diabetes)? No.  Last in office visit on 09/16/2022.  Clinician Recommendations:  Aim for 30 minutes of exercise or brisk walking, 6-8 glasses of water, and 5 servings of fruits and vegetables each day. Remember to call the office to schedule a follow up visit with Lauraine soon.        This is a list of the screenings recommended for you:  Health Maintenance  Topic Date Due   Hepatitis C Screening  Never done   DTaP/Tdap/Td vaccine (1 - Tdap) Never done   Cologuard (Stool DNA test)  Never done   Flu Shot  Never done   Medicare Annual Wellness Visit  01/03/2025   Mammogram  06/28/2025   DEXA scan (bone density measurement)  Completed   Zoster (Shingles) Vaccine  Completed   HPV Vaccine  Aged Out   Meningitis B Vaccine  Aged Out   Pneumococcal Vaccine for age over 17  Discontinued   COVID-19 Vaccine  Discontinued    Advanced directives: (Copy Requested) Please bring a copy of your health care power of attorney and living will to the office to be added to your chart at your convenience. You can mail to [facility] [address] [place], [place] [address] or email to [email or web address] Advance Care Planning is important because it:  [x]  Makes sure you receive the medical care that is consistent with your values, goals, and preferences  [x]  It provides guidance to your  family and loved ones and reduces their decisional burden about whether or not they are making the right decisions based on your wishes.  Follow the link provided in your after visit summary or read over the paperwork we have mailed to you to help you started getting your Advance Directives in place. If you need assistance in completing these, please reach out to us  so that we can help you!  See attachments for Preventive Care and Fall Prevention Tips.

## 2024-01-22 ENCOUNTER — Other Ambulatory Visit: Payer: Self-pay | Admitting: Nurse Practitioner

## 2024-01-22 DIAGNOSIS — E66811 Obesity, class 1: Secondary | ICD-10-CM

## 2024-02-01 ENCOUNTER — Other Ambulatory Visit (INDEPENDENT_AMBULATORY_CARE_PROVIDER_SITE_OTHER): Payer: Self-pay

## 2024-02-01 ENCOUNTER — Ambulatory Visit: Admitting: Orthopedic Surgery

## 2024-02-01 VITALS — BP 116/73 | HR 68 | Ht 61.0 in | Wt 141.0 lb

## 2024-02-01 DIAGNOSIS — M545 Low back pain, unspecified: Secondary | ICD-10-CM

## 2024-02-01 DIAGNOSIS — M7541 Impingement syndrome of right shoulder: Secondary | ICD-10-CM

## 2024-02-01 NOTE — Progress Notes (Signed)
 Orthopedic Spine Surgery Office Note  Assessment: Patient is a 73 y.o. female with scoliosis but no significant deviation from the CSVL.  No back or radicular leg pain  She also has right shoulder right shoulder pain and has been treated for impingement syndrome with Ronal Dragon   Plan: - With her not having any significant back or leg pain, I did not recommend any further treatment.  I explained showed on x-rays that she does not have any significant coronal imbalance.  She does not have any sagittal imbalance.  I told her that we can keep an eye on her curvature with periodic x-rays.  I do not feel that a decompression surgery would improve her coronal curvature -Patient was interested in a repeat injection in her shoulder since it did help when Ronal Dragon did that for her. This was done in the office today, see procedure note below -Patient should return to office in 1 year, x-rays at next visit: scoliosis   Right shoulder injection note: After discussing the risk, benefits, alternatives of right shoulder intra-articular injection, patient elected to proceed with intra-articular traction.  The patient was in the seated position with the shoulder relaxed.  The soft spot about a centimeter distal to the acromion posteriorly was prepped with an alcohol-based prep.  Ethyl chloride was used to anesthetize the skin.  A 20-gauge needle was used to inject 1 cc of Depo-Medrol , 1 cc of lidocaine , 1 cc of bupivacaine  into the intra-articular space.  Needle was withdrawn and bandage was applied.  Patient tolerated procedure well.  ___________________________________________________________________________   History:  Patient is a 73 y.o. female who presents today for scoliosis check.  Patient states within the last couple of months she has noticed that she stands a lot for pain.  She feels that her left shoulder is higher than the right.  She said she had symptoms like this before her prior surgery with Dr.  Barbarann.  She does not recall any leg pain at that time but said the surgery was done for this curvature.  She said the curvature improved after surgery.  She currently is not having any back or radiating leg pain.   Weakness: Denies Symptoms of imbalance: Denies Paresthesias and numbness: Denies Bowel or bladder incontinence: Denies Saddle anesthesia: Denies  Treatments tried: none  Review of systems: Denies fevers and chills, night sweats, unexplained weight loss, history of cancer. Has had shoulder pain that wakes her at night  Past medical history: GERD Renal disease Vasculitis  Allergies: NKDA  Past surgical history:  L4/5 laminectomy Right THA C section Bilateral TKA  Social history: Denies use of nicotine product (smoking, vaping, patches, smokeless) Alcohol use: Yes, approximately 2 drinks per month Denies recreational drug use   Physical Exam:  BMI of 26.6  General: no acute distress, appears stated age Neurologic: alert, answering questions appropriately, following commands Respiratory: unlabored breathing on room air, symmetric chest rise Psychiatric: appropriate affect, normal cadence to speech   MSK (spine):  -Strength exam      Left  Right EHL    5/5  5/5 TA    5/5  5/5 GSC    5/5  5/5 Knee extension  5/5  5/5 Hip flexion   5/5  5/5  -Sensory exam    Sensation intact to light touch in L3-S1 nerve distributions of bilateral lower extremities  -Achilles DTR: 2/4 on the left, 2/4 on the right -Patellar tendon DTR: 2/4 on the left, 2/4 on the right  -Straight  leg raise: negative bilaterally -Clonus: no beats bilaterally  -Left hip exam: no pain through range of motion -Right hip exam: no pain through range of motion  Imaging: XRs scoliosis from 02/01/2024 were independently reviewed and interpreted, showing a 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  Deviation of about 8mm from the CSVL.  No significant sagittal imbalance.  No  fracture or dislocation seen.  Spondylolisthesis seen at L4/5.  Disc height loss at C3/4, C4/5, C5/6.  Anterior osteophyte formation seen at C4/5 and C5/6.   Patient name: Chelsea Callahan Patient MRN: [id number] Date of visit: 02/01/24

## 2024-02-13 ENCOUNTER — Ambulatory Visit (INDEPENDENT_AMBULATORY_CARE_PROVIDER_SITE_OTHER): Admitting: Internal Medicine

## 2024-02-13 ENCOUNTER — Ambulatory Visit: Admitting: *Deleted

## 2024-02-13 ENCOUNTER — Encounter: Payer: Self-pay | Admitting: Internal Medicine

## 2024-02-13 VITALS — BP 122/62 | HR 67 | Temp 98.3°F | Ht 60.0 in | Wt 140.0 lb

## 2024-02-13 DIAGNOSIS — R7689 Other specified abnormal immunological findings in serum: Secondary | ICD-10-CM | POA: Diagnosis not present

## 2024-02-13 DIAGNOSIS — I272 Pulmonary hypertension, unspecified: Secondary | ICD-10-CM | POA: Diagnosis not present

## 2024-02-13 DIAGNOSIS — J8489 Other specified interstitial pulmonary diseases: Secondary | ICD-10-CM | POA: Diagnosis not present

## 2024-02-13 DIAGNOSIS — I7300 Raynaud's syndrome without gangrene: Secondary | ICD-10-CM

## 2024-02-13 DIAGNOSIS — Z79624 Long term (current) use of inhibitors of nucleotide synthesis: Secondary | ICD-10-CM | POA: Diagnosis not present

## 2024-02-13 DIAGNOSIS — D849 Immunodeficiency, unspecified: Secondary | ICD-10-CM

## 2024-02-13 DIAGNOSIS — R911 Solitary pulmonary nodule: Secondary | ICD-10-CM | POA: Diagnosis not present

## 2024-02-13 DIAGNOSIS — M359 Systemic involvement of connective tissue, unspecified: Secondary | ICD-10-CM

## 2024-02-13 LAB — PULMONARY FUNCTION TEST
DL/VA % pred: 106 %
DL/VA: 4.54 ml/min/mmHg/L
DLCO cor % pred: 92 %
DLCO cor: 15.6 ml/min/mmHg
DLCO unc % pred: 92 %
DLCO unc: 15.6 ml/min/mmHg
FEF 25-75 Pre: 2.86 L/s
FEF2575-%Pred-Pre: 181 %
FEV1-%Pred-Pre: 106 %
FEV1-Pre: 1.96 L
FEV1FVC-%Pred-Pre: 114 %
FEV6-%Pred-Pre: 97 %
FEV6-Pre: 2.27 L
FEV6FVC-%Pred-Pre: 105 %
FVC-%Pred-Pre: 92 %
FVC-Pre: 2.27 L
Pre FEV1/FVC ratio: 86 %
Pre FEV6/FVC Ratio: 100 %

## 2024-02-13 NOTE — Patient Instructions (Signed)
 Spirometry and diffusion capacity performed today.

## 2024-02-13 NOTE — Progress Notes (Signed)
 OV 08/26/2021 - new ILD [facility] ILD center. Referred by Dr Maya Nash  Subjective:  Patient ID: Chelsea Callahan, female , DOB: 16-Apr-1951 , age 73 y.o. , MRN: [id number] , ADDRESS: [address] [place] [address] PCP Elnor Lauraine BRAVO, NP Patient Care Team: Elnor Lauraine BRAVO, NP as PCP - General (Nurse Practitioner)  This Provider for this visit: Treatment Team:  Attending Provider: Geronimo Amel, MD    08/26/2021 -   Chief Complaint  Patient presents with   Consult    Referred for possible ILD     HPI Chelsea Callahan 73 y.o. -referred by Dr Maya Meyer.  73 year old female.  She used to live in [place]  and [facility] and.  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  Then in 2013 she relocated to [facility] along with her husband.  This is for her retirement.  She tells me that approximately 13 years ago she developed Raynaud's symptoms.  She also started having some alopecia may be a year or 2 before that.  This then resulted in a positive ANA testing.  She then started following with a rheumatologist for 5 years before she relocated to [facility] and for the last 8 or 9 years has established care with Dr. Nash.  Her ANA here is also positive [see below].  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  She is followed periodically.  She is on Plaquenil .  She has arthritis but this is osteoarthritis.  She denies any scleroderma findings or rheumatoid arthritis findings of malar rash or oral ulcers or hematuria or other connective tissue disease findings beyond the Raynaud's.  During the course of routine follow-up this year she was discovered to have crackles.  The notes also confirmed this.  This resulted in a high-resolution CT chest that shows interstitial lung disease predominantly with a craniocaudal gradient.  Reticulation and some traction bronchiectasis  but no honeycombing.  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  Therefore she has been referred here.  She tells me that she prefers to be very aggressive with the treatment and management approach.  She has a life goal of living up to 90 years.  Currently she is able to walk 2 miles every 2 weeks.  She never stops.   She does not feel much short of breath.  Initially she has some chest tightness and this resolves.  Is not much of a cough.  Past medical history - Negative for any specific connective tissue disease.  Negative for hiatal hernia and acid reflux. - Positive for ANA + and low titer rheumatoid factor [discussion with Dr. JONETTA today:: Undifferentiated connective tissue disease:] - She has had the COVID disease in January 2022.  She also had COVID-vaccine 4 shots.  Review of systems - Raynaud's present - Osteoarthritis present  Family history of pulmonary disease - Brother has myasthenia gravis but otherwise negative  Exposure history - Smoked from [age] although in between she stopped between 1979 and 1983.  3 to 5 cigarettes/day.  She did some marijuana as a teenager.  Otherwise no cocaine use or no intravenous drug use.  Home and Hobby details - [place]  She has lived there for 2 years.  Is a private home.  Detail organic antigen exposure history in this house is negative.  She is never  used feather pillow or down jackets.  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  Detail organic and inorganic antigen exposure history at work is negative  Pulmonary toxicity history - Denies any adverse medication intake    AUOTIMMUNE   Latest Reference Range & Units 10/17/16 12:14 08/17/17 14:55 03/07/18 14:48 07/03/19 11:32 06/16/20 14:15 11/13/20 00:00 07/06/21 15:20  Anti Nuclear Antibody (ANA) NEGATIVE        POSITIVE !  ANA Pattern 1        Nuclear, Nucleolar !  ANA Titer 1 titer       1:1,280 (H)  ds DNA Ab IU/mL 1 1 1 1 1 1  1   ENA SM Ab Ser-aCnc <1.0 NEG AI       <1.0 NEG  !: Data is abnormal (H): Data is abnormally high  Latest Reference Range & Units 07/06/21 15:20  Ribonucleic Protein(ENA) Antibody, IgG <1.0 NEG AI <1.0 NEG  ENA SM Ab Ser-aCnc <1.0 NEG AI <1.0 NEG  SSA (Ro) (ENA) Antibody, IgG <1.0 NEG AI <1.0 NEG  SSB (La) (ENA) Antibody, IgG <1.0 NEG AI <1.0 NEG  Scleroderma (Scl-70) (ENA) Antibody, IgG <1.0 NEG AI <1.0 NEG    Latest Reference Range & Units 10/17/16 12:14 08/17/17 14:55 03/07/18 14:48 07/03/19 11:32 06/16/20 14:15 11/13/20 00:00 07/06/21 15:20  Sed Rate 0 - 30 mm/h  9 1 6 6 9 11   Anticardiolipin Ab,IgA,Qn APL <11 <11       Anticardiolipin Ab,IgG,Qn GPL <14 <14       Anticardiolipin Ab,IgM,Qn MPL <12 <12        CT Chest data - HRCT March 2023  Narrative & Impression  CLINICAL DATA:  Autoimmune disease, crackles in chest.   EXAM: CT CHEST WITHOUT CONTRAST   TECHNIQUE: Multidetector CT imaging of the chest was performed following the standard protocol without intravenous contrast. High resolution imaging of the lungs, as well as inspiratory and expiratory imaging, was performed.   RADIATION DOSE REDUCTION: This exam was performed according to the departmental dose-optimization program which includes automated exposure control, adjustment of the mA and/or kV according to patient size and/or use of iterative reconstruction technique.   COMPARISON:  None.   FINDINGS: Cardiovascular: Atherosclerotic calcification of the aorta, aortic valve and coronary arteries. Enlarged right and left pulmonary arteries and heart. Small amount of pericardial fluid may be physiologic.   Mediastinum/Nodes: Mediastinal lymph nodes measure up to 10 mm in the AP window. Hilar regions are difficult to evaluate without IV contrast. Small axillary and subpectoral lymph nodes with the largest measuring 1.4 cm in the right axilla. Esophagus is grossly unremarkable.   Lungs/Pleura: Peripheral  basilar subpleural reticulation, ground-glass and traction bronchiectasis/bronchiolectasis. No definitive honeycombing. Probable subpleural lymph nodes along the right major fissure, measuring up to 5 mm. Calcified granulomas. Noncalcified 5 mm lingular nodule (4 x 6 mm, 6/73). No pleural fluid. Airway is unremarkable. Mild air trapping.   Upper Abdomen: Visualized portion of the liver is unremarkable. Stone in the gallbladder. Visualized portions of the adrenal glands, kidneys, spleen and pancreas are grossly unremarkable. Gastric bypass.   Musculoskeletal: Degenerative changes in the spine. Old right rib fractures. No worrisome lytic or sclerotic lesions.   IMPRESSION: 1. Pulmonary parenchymal pattern of interstitial lung disease may be due to usual interstitial pneumonitis or nonspecific interstitial pneumonitis. Findings are categorized as probable UIP per consensus guidelines: Diagnosis of Idiopathic Pulmonary Fibrosis: An Official ATS/ERS/JRS/ALAT Clinical Practice Guideline. Am J Respir Crit Care Med Vol 198, Iss 5,  [id number], Dec 31 2016. 2. Small amount of pericardial fluid may be physiologic. 3. 5 mm lingular nodule. If the patient is high-risk, a non-contrast chest CT can be considered in 12 months.This recommendation follows the consensus statement: Guidelines for Management of Incidental Pulmonary Nodules Detected on CT Images: From the Fleischner Society 2017; Radiology 2017; 284:228-243. 4. Small to borderline enlarged mediastinal and axillary lymph nodes. Difficult to exclude a lymphoproliferative disorder. 5. Cholelithiasis. 6. Aortic atherosclerosis (ICD10-I70.0). Coronary artery calcification. 7. Enlarged pulmonary arteries, indicative of pulmonary arterial hypertension. 8.  Emphysema (ICD10-J43.9).     Electronically Signed   By: Newell Eke M.D.   On: 07/19/2021 10:34         10/08/2021 Patient presents today for follow-up.  She has a history of  undifferentiated connective tissue disease.  She follows with rheumatology.  HRCT imaging in March showed pulmonary parenchymal pattern of interstitial lung disease, findings are categorized as probable UIP.  One antibody in myositis panel was positive.  Recently been placed on CellCept by Dr. Curt with Rheumatology, awaiting results of hep panel before starting medication.  Pulmonary function testing today was normal.  Simple walk test without oxygen desaturation.  She is largely asymptomatic.  She walks on average 2 miles a day 5 times a week.  Only respiratory complaint is some dyspnea symptoms about halfway through her walk.  She has no significant shortness of breath, cough, chest tightness or wheezing.  Pulmonary function testing 10/08/2021 FVC 2.09 (107%), FEV1 1.87 (125%), ratio 90, TLC 81%, DLCOunc 102%  RHC 10/27/21  Findings:   RA = 8 RV = 45/11 PA = 45/13 (28) PCW = 17 Fick cardiac output/index = 5.2/2.9 PVR = 2.1 FA sat = 97% PA sat = 69%, 70% SVC sat = 66%   Assessment: 1. 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 Geronimo. Likely start macitentan    Toribio Fuel, MD  1:40 PM   OV 01/25/2022  Subjective:  Patient ID: Chelsea Callahan, female , DOB: 02-13-1951 , age 9 y.o. , MRN: [id number] , ADDRESS: [address] [place] [address] PCP Elnor Lauraine BRAVO, NP Patient Care Team: Elnor Lauraine BRAVO, NP as PCP - General (Nurse Practitioner)  This Provider for this visit: Treatment Team:  Attending Provider: Geronimo Amel, MD    01/25/2022 -   Chief Complaint  Patient presents with   Follow-up    PFT performed 8/11 at [facility].  Pt states she has been doing okay since last visit and denies any complaints.   HPI Chelsea Callahan 73 y.o. -returns for follow-up.  Since I last personally met her she has now been started on CellCept after switching rheumatologist to [facility] Dr. Curt.  This is  on account of positive ANA and also positive myositis antibody and Raynaud's.  She is also had a right heart catheterization.  She has been started on room audio.  She continues to be very minimally symptomatic or asymptomatic from the shortness of breath cough and fatigue standpoint.  She she also wanted to get a second opinion for lung disease.  She is taken an appointment Dr. Almond Cone at [facility].  She did have pulmonary function test in August 2023.  Some except for admit as documented below.  However when she went there they only had a pulmonary function test scheduled but not the office appointment.  The office appointment is now being scheduled for  March 03, 2022 which is 3 months since the pulmonary function test.  Also at this time she is now on CellCept.  We took a shared decision making that she will have 1 more pulmonary function test it was the end of October 2023 to give a sequential timeline for Dr. Jesus to help with his decision making.  We discussed the fact that nintedanib would be the next agent and whether we will start it now or wait till if any progression.  This will be a question that would be post to Dr. Jesus.  The pulmonary function test data should help.  We discussed her respiratory vaccines but currently she wants to hold off.  Of note she has a 5 mm lingular nodule in March 2023.  She is a remote smoker.  We will get a repeat CT scan of the chest in spring or summer 2024.    PFT  OV 04/12/2022  Subjective:  Patient ID: Chelsea Callahan, female , DOB: [birth year] , age 51 y.o. , MRN: [id number] , ADDRESS: [address] [place] [address] PCP Elnor Lauraine BRAVO, NP Patient Care Team: Elnor Lauraine BRAVO, NP as PCP - General (Nurse Practitioner)  This Provider for this visit: Treatment Team:  Attending Provider: Geronimo Amel, MD    04/12/2022 -   Chief Complaint  Patient presents with   Follow-up    Pt states she has been doing okay since last  visit. States she has had an infection in her mouth that she states her dentist does not know how to treat.     HPI Chelsea Callahan 73 y.o. -returns for follow-up.  She continues on CellCept 100 mg twice daily.  After seeing me last time she had pulmonary function test that shows stability [see below].  She then saw Dr. Almond Jesus.  He advised continuing current course with CellCept 5 mg twice daily and then consider add-on therapy.  They discussed biopsy.  But currently that was being withheld.  He also felt it was too early for lung transplant.  He did weigh in on adding a second drug of going up on CellCept but in balance they took a shared decision making to continue current course.  Currently she is content continuing the current course.  She has seen Dr. Curt in rheumatology recently and had blood work for her CellCept and she said it is normal.  We again discussed vaccines but she declined.  I did emphasize the risk that comes with respiratory infections in the setting of immunosuppression pulmonary fibrosis but she prefers to defer vaccination.  I then advised her to stay away from human clusters especially during the fall and winter and avoid sick contacts.  She verbalized understanding.  We discussed continued serial monitoring.  She is most accepting of this plan.  She will continue her CellCept at the current dose.  We discussed about ways to monitor her symptoms and if there is any change in her shortness of breath between now and the next visit she will let us  know.     OV 07/07/2022  Subjective:  Patient ID: Chelsea Callahan, female , DOB: 17-Dec-1950 , age 44 y.o. , MRN: [id number] , ADDRESS: [address] [place] [address] PCP Elnor Lauraine BRAVO, NP Patient Care Team: Elnor Lauraine BRAVO, NP as PCP - General (Nurse Practitioner)  This Provider for this visit: Treatment Team:  Attending Provider: Geronimo Amel, MD    07/07/2022 -   Chief Complaint  Patient presents  with   Follow-up     Ct f/u      HPI Chelsea Callahan 73 y.o. -returns for follow-up.  She continues to be immunosuppressed.  She takes CellCept regularly.  She feels stable minimal symptoms she walks 2 miles per day.  Her husband is here with her.  She is very anxious about his CT scan results which shows continued stability of her ILD.  In the same context her symptom score is unchanged and a walking desaturation test is stable.  Her last pulmonary function test was in October 2023.  She is really relieved about the result.  Of note she has a 5 mm lingular nodule and this is also stable.  She says she has upcoming dental surgery.  She has a dental implant that 18 or 73 years old.  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  She is being recommended extraction and then replacement at least partially.  She is nervous about this but she has noticed pus intermittently coming from her gums.  This past predates her starting CellCept.  I have indicated to her to hold the CellCept before and after the surgery to be extra cautious.  Did discuss the risk would be progressive ILD but the risk would be low given current stability.    OV 12/23/2022  Subjective:  Patient ID: Chelsea Callahan, female , DOB: 1950-10-16 , age 48 y.o. , MRN: [id number] , ADDRESS: [address] [place] [address] PCP Elnor Lauraine BRAVO, NP Patient Care Team: Elnor Lauraine BRAVO, NP as PCP - General (Nurse Practitioner)  This Provider for this visit: Treatment Team:  Attending Provider: Geronimo Amel, MD    12/23/2022 -   Chief Complaint  Patient presents with   Follow-up    F/up on PFT,    Type of visit: Video Virtual Visit Identification of patient Chelsea Callahan with 12/17/1950 and MRN [id number] - 2 person identifier Risks: Risks, benefits, limitations of telephone visit explained. Patient understood and verbalized agreement to proceed Anyone else on call: no Patient location: her home This provider location:  [address], [address]; [place]; [place] [address]. [facility]. [phone]    HPI Chelsea Callahan 73 y.o. -returns for follow-up.  This is a video visit.  Last pulmonary function test was in June 2024 and showed a 10% decline in FVC.  She tells me overall for her activities of daily living she is stable but sometimes she feels she might be more short of breath.  Otherwise no Interim Health status: No new complaints No new medical problems. No new surgeries. No ER visits. No Urgent care visits.   Her upcoming appointment at [facility] ILD clinic is in the December 2024.  Of note she did see Dr. Cherrie and she had an echocardiogram in June 2024 she had grade 2 grade 2 diastolic dysfunction.  Because of lack of subjective improvement he stopped of audio.  We discussed the most recent pulmonary function test and the decline.  She was about to get another pulmonary function test I had of this visit but this was not scheduled because of the backlog in our office.  She is very upset.  She feels that she is being victimized because of our office inefficiencies.  I initially discussed that it could be a 93-month wait for pulmonary function test.  She does not want to wait that long.  She feels she has a terminal disease.  Every chart to our office scheduling to  see if we can get her spirometry and DLCO done either at our office or in the hospital within the next few weeks.  Patient is okay with this plan.  If this is not possible I will have to look for alternate places where the pulmonary function test can be done.         CT Chest data from date: 07/05/22  - personally visualized and independently interpreted : NO - my findings are: none other than see offical report   IMPRESSION: 1. Today's examination is generally somewhat limited by breath motion artifact within this limitation, no obvious interval change in mild pulmonary fibrosis in a pattern with apical to  basal gradient, featuring irregular peripheral interstitial opacity, septal thickening, and small areas of subpleural bronchiolectasis at the lung bases without clear evidence of honeycombing. Findings are categorized as probable UIP per consensus guidelines: Diagnosis of Idiopathic Pulmonary Fibrosis: An Official ATS/ERS/JRS/ALAT Clinical Practice Guideline. Am JINNY Honey Crit Care Med Vol 198, Iss 5, [phone], Dec 31 2016. 2. Unchanged 0.5 cm nodule of the posterior lingula, benign, for which no further follow-up or characterization is required. 3. Cardiomegaly and coronary artery disease.   Aortic Atherosclerosis (ICD10-I70.0).     Electronically Signed   By: Marolyn JONETTA Jaksch M.D.   On: 07/06/2022 15:51  OV 12/27/2022  Subjective:  Patient ID: Chelsea Callahan, female , DOB: [birth year] , age 16 y.o. , MRN: [id number] , ADDRESS: [address] [place] [address] PCP Elnor Lauraine BRAVO, NP Patient Care Team: Elnor Lauraine BRAVO, NP as PCP - General (Nurse Practitioner)  This Provider for this visit: Treatment Team:  Attending Provider: Geronimo Amel, MD    12/27/2022 -   Chief Complaint  Patient presents with   Follow-up    F/up on PFT   +.   HPI Chelsea Callahan 73 y.o. -+ returns for follow-up after her video visit last week.  Actually 4 days ago.  She has had PFTs.  Pulmonary function test continues to be normal.  It is very similar to June 2024.  Although normal it is a slight decline compared to 1 year ago.  1 year ago it was supranormal.  Her symptom scores appears to be range bound.  She continues to have significant amount of anxiety.  She has upcoming visit at [facility] April 11, 2023 with Dr. Almond Cone ILD clinic.  We discussed about just following with him in December.  With him he took a shared decision making to see her at the midpoint just a clinical visit to make sure the ILD is stable.  Therefore see her back in 7 weeks.  Noticed her anxiety  scores are positive hence increase since late last year.  Her last the CMA to ask her about her interest in seeing a counselor.  I noticed that she is not on any SNRI or SSRI or anxiolytics.    OV 02/17/2023  Subjective:  Patient ID: Chelsea Callahan, female , DOB: 1950-06-03 , age 55 y.o. , MRN: [id number] , ADDRESS: [address] [place] [address] PCP Elnor Lauraine BRAVO, NP Patient Care Team: Elnor Lauraine BRAVO, NP as PCP - General (Nurse Practitioner)  This Provider for this visit: Treatment Team:  Attending Provider: Geronimo Amel, MD    02/17/2023 -   Chief Complaint  Patient presents with   Follow-up    Pt denies any concerns, still on cellcept       HPI Chelsea Callahan 73 y.o. -ILD patient.  She  has upcoming appointment later this year and [facility] which is annual follow-up there.  This with Dr. Almond Cone.  At the last visit in August 2020 for there are some concern about pulmonary function test decline therefore we decided to do an interim visit right now with pulmonary function test but for some reason her pulmonary function test was not scheduled.  I do not know why.  I apologize for this.  In any event subjectively she feels the same.  We did a quick exercise sit/stand hypoxemia test and there is no desaturation.  We resolved for her to visit with Dr. Cone and #2024 but see me back in 4 months which would be approximately 3 months after she sees him with a pulmonary function test.  She is of REVATIO      OV 06/22/2023     Subjective:  Patient ID: Chelsea Callahan, female , DOB: 12/02/1950 , age 23 y.o. , MRN: [id number] , ADDRESS: [address] [place] [address] PCP Elnor Lauraine BRAVO, NP Patient Care Team: Elnor Lauraine BRAVO, NP as PCP - General (Nurse Practitioner)  This Provider for this visit: Treatment Team:  Attending Provider: Geronimo Amel, MD    06/22/2023 -  No chief complaint on file.  Type of visit: Video Virtual  Visit Identification of patient Chelsea Callahan with 1950-11-09 and MRN [id number] - 2 person identifier Risks: Risks, benefits, limitations of telephone visit explained. Patient understood and verbalized agreement to proceed Anyone else on call: just patient Patient location: She is on a mobile device and in [place] .  The video feed did not come despite trying 2 times. This provider location: [address]; [place]; [place] [address]. [facility]. [phone]   HPI Chelsea Callahan 73 y.o. -returns for follow-up.  Since her last visit with me she saw Dr. Almond Cone December 2024.  He felt she was stable.  The consensus was to leave her on 5 mg twice daily of CellCept and not make any change.  She continues to report stability.  She had pulmonary function test end of January 2025 and it is stable.  She told me that she is quite anxious waiting for the results of the pulmonary function test and she likes her early release on the results.  I.  She says that at [facility] they do both on the same day which is the visit in the PFT.  She is agreeable to having both the visit and the PFT here on the same day.  Her next visit at [facility] is in June 2025 [10/30/2023].  We talked about spacing herself adequately between visits and we will see her in September 2025.  She is okay with this plan.  She will do pulmonary function test on the same day as a visit with me in order to reduce the anxiety burden of waiting for the results.  But she does know in the interim if there is a problem she will call me.       OV 02/13/2024  Subjective:  Patient ID: Chelsea Callahan, female , DOB: February 05, 1951 , age 72 y.o. , MRN: [id number] , ADDRESS: [address] [place] [address] PCP Elnor Lauraine BRAVO, NP Patient Care Team: Elnor Lauraine BRAVO, NP as PCP - General (Nurse Practitioner)  This Provider for this visit: Treatment Team:  Attending Provider: Geronimo Amel, MD    #Undifferentiated connective tissue disease  -Myositis antibody negative's 2023 - Raynaud's + -ANA +  2023 -On CellCept through Dr. Curt since October 05, 2021 -Also on Plaquenil  -[employer] second opinion Dr. 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  - Last HRCT 07/05/22  -Crackles on presentation  -Very mild burden.     - stable PFT as of OCt 2025  #Mild pulmonary hypertension right heart cath June 2023 --Dr. Cherrie  -Pulmonary capillary wedge pressure 17, PVR 2.1, PA mean 28  -Revatio  started by Dr. Cherrie fper history  #5 mm lingula nodule March 2023 -> march 2024 without change  - stopped smoking 1999 (in betewee 3 years no smoking)  - strted smoking 1972/1973  -2-4 cigs  per day (habitual but mild)  - total 2.7 ppy smoking hisotryy  #Grade 2 diastolic dysfunction on echocardiogram June 2024.  02/13/2024 -   Chief Complaint  Patient presents with   Interstitial Lung Disease    Pft f/u  Pt states breathing has been fine      HPI Chelsea Callahan 73 y.o. -returns for her routine follow-up.  Last seen in early 2025 Interim Health status: No new complaints No new medical problems. No new surgeries. No ER visits. No Urgent care visits. No changes to medications other than the fact she is taking over-the-counter medication that is new.  She did see Dr. Cone October 30, 2023 at [facility].  Deemed to be stable.  He recommended continuing the CellCept which I agree with.  Patient had pulmonary function test today and it is stable.  She also had symptom score see below is also stable.  She feels stable since then hypoxemia test is stable.  She deferred flu shot today.  Dr. Cone recommended getting a CT scan before her next visit.  He wanted me to do it.  His next visit with is end of January 2026.  Will get the CT scan before that.    We will see her in the spring 2026 with a pulmonary function  test.    SYMPTOM SCALE - ILD 01/25/2022 01/25/2022  04/12/2022  07/07/2022  12/27/2022  02/17/2023  02/13/2024   Current weight         O2 use ra ra ra ra ra ra ra  Shortness of Breath 0 -> 5 scale with 5 being worst (score 6 If unable to do)        At rest 000 0 0 0 0 0 0  Simple tasks - showers, clothes change, eating, shaving 0 0 0 0 0 0 0  Household (dishes, doing bed, laundry) 0 0 1 0 1 0 0  Shopping 00 0 1 0 1 00 0  Walking level at own pace 0 0 1 0 1 0 0  Walking up Stairs 0  1+ 1.5 1 0.5 1  Total (30-36) Dyspnea Score 0 00 4 1.5 4 0.5 1  How bad is your cough? 0 0 0 0 0 0 0  How bad is your fatigue 0 0 1 0 No sutre 0.5 0  How bad is nausea 0 0 0 0 1 0 0  How bad is vomiting?  0 0 0 0 0 0 0  How bad is diarrhea? 0 0 0 0 0 0 0  How bad is anxiety? 0 0 2 4 4 1 1   How bad is depression 2 2 1 1  0.5 0 1  Any chronic pain - if so where and how bad 1 1 x x x  0  SIT STAND TEST - goal 15 times   02/13/2024    O2 used ra   PRobe - finter or forehead forehead   Number sit and stand completed - goal 15 15   Time taken to complete 38 sec   Resting Pulse Ox/HR/Dyspnea  98% and 97/min and dyspnea of 0/10    Peak measures 99 % and 93/min and dyspnea of 0/10   Final Pulse Ox/HR 100% and 72/min and dyspnea of 0/10   Desaturated </= 88% no   Desaturated <= 3% points no   Got Tachycardic >/= 90/min yes   Miscellaneous comments no      PFT     Latest Ref Rng & Units 02/13/2024    8:26 AM 06/01/2023   11:20 AM 12/27/2022   10:50 AM 10/19/2022    2:52 PM 02/28/2022    2:21 PM 10/08/2021   10:54 AM  PFT Results  FVC-Pre L 2.27  P  1.99  1.92  2.14  2.14   FVC-Predicted Pre % 92  P 78  73  70  84  110   FVC-Post L      2.09   FVC-Predicted Post %      107   Pre FEV1/FVC % % 86  P 85  90  86  90  90   Post FEV1/FCV % %      90   FEV1-Pre L 1.96  P 1.80  1.79  1.64  1.93  1.93   FEV1-Predicted Pre % 106  P 87  87  80  101  129   FEV1-Post L      1.87   DLCO  uncorrected ml/min/mmHg 15.60  P 15.70  15.40  15.75  17.35  17.27   DLCO UNC% % 92  P 86  85  87  102  102   DLCO corrected ml/min/mmHg 15.60  P 15.70  15.40  15.75  18.39  17.27   DLCO COR %Predicted % 92  P 86  85  87  108  102   DLVA Predicted % 106  P 100  106  106  112  104   TLC L      3.64   TLC % Predicted %      81   RV % Predicted %      63     P Preliminary result       LAB RESULTS last 96 hours No results found.       has a past medical history of Anemia, Auto immune neutropenia, Blood transfusion without reported diagnosis, Cataract, GERD (gastroesophageal reflux disease), Lung disease (2022), Primary osteoarthritis of right knee, Raynaud disease, Spinal headache, and Vasculitis.   reports that she quit smoking about 26 years ago. Her smoking use included cigarettes. She started smoking about 41 years ago. She has a 3.8 pack-year smoking history. She has been exposed to tobacco smoke. She has never used smokeless tobacco.  Past Surgical History:  Procedure Laterality Date   abdominal clip     to stomach post gastric bypass   AXILLARY LYMPH NODE BIOPSY Right 01/27/2021   Procedure: RIGHT AXILLARY LYMPH NODE BIOPSY;  Surgeon: Vanderbilt Ned, MD;  Location: [facility];  Service: General;  Laterality: Right;   CESAREAN SECTION     x 2   cortisone injection Right 12/2022   shoulder   GASTRIC BYPASS     HERNIA REPAIR  2010   inverted hermia   HIP ARTHROPLASTY  JOINT REPLACEMENT Right 2005   hip   JOINT REPLACEMENT Bilateral    knee    KNEE ARTHROPLASTY Left 09/13/2013   Procedure: LEFT COMPUTER ASSISTED TOTAL KNEE ARTHROPLASTY;  Surgeon: Oneil JAYSON Herald, MD;  Location: [facility] OR;  Service: Orthopedics;  Laterality: Left;  Left Total Knee Arthroplasty, Computer Assist   KNEE ARTHROPLASTY Right 05/23/2014   Procedure: COMPUTER ASSISTED TOTAL KNEE ARTHROPLASTY;  Surgeon: Oneil JAYSON Herald, MD;  Location: [facility] OR;  Service: Orthopedics;  Laterality: Right;    LUMBAR LAMINECTOMY/DECOMPRESSION MICRODISCECTOMY N/A 05/13/2016   Procedure: L4-5 Decompression;  Surgeon: Oneil JAYSON Herald, MD;  Location: [facility];  Service: Orthopedics;  Laterality: N/A;   RIGHT HEART CATH N/A 10/27/2021   Procedure: RIGHT HEART CATH;  Surgeon: Cherrie Toribio SAUNDERS, MD;  Location: [facility];  Service: Cardiovascular;  Laterality: N/A;   teeth etraction     TOTAL KNEE ARTHROPLASTY Right 05/22/2014   dr herald   TUBAL LIGATION  1981   UPPER GI ENDOSCOPY      Allergies  Allergen Reactions   Ginger Swelling and Other (See Comments)    Stomach swells   Porcine (Pork) Protein-Containing Drug Products Other (See Comments)    UNSPECIFIED PERSONAL REASONS   Shellfish Allergy Other (See Comments)    UNSPECIFIED Personal reasons/dietary    Immunization History  Administered Date(s) Administered   PFIZER Comirnaty(Gray Top)Covid-19 Tri-Sucrose Vaccine 05/21/2019, 06/11/2019, 04/05/2020, 10/13/2020   PFIZER(Purple Top)SARS-COV-2 Vaccination 05/21/2019, 06/11/2019, 04/05/2020   Zoster Recombinant(Shingrix) 03/08/2018, 05/08/2018   Zoster, Unspecified 03/08/2018, 05/08/2018    Family History  Problem Relation Age of Onset   Cancer Brother    Multiple myeloma Brother    Colon cancer Neg Hx    Esophageal cancer Neg Hx    Rectal cancer Neg Hx    Stomach cancer Neg Hx    Colon polyps Neg Hx      Current Outpatient Medications:    aspirin  EC 81 MG tablet, Take 81 mg by mouth daily. Swallow whole., Disp: , Rfl:    Fe Bisgly-Succ-C-Thre-B12-FA (IRON-150 PO), Take 1 tablet by mouth every other day., Disp: , Rfl:    hydroxychloroquine  (PLAQUENIL ) 200 MG tablet, TAKE 1 TABLET BY MOUTH EVERY DAY, Disp: 90 tablet, Rfl: 0   mycophenolate (CELLCEPT) 500 MG tablet, Take 500 mg by mouth 2 (two) times daily., Disp: , Rfl:    omeprazole  (PRILOSEC) 40 MG capsule, Take 1 capsule (40 mg total) by mouth 2 (two) times daily before a meal., Disp: 180 capsule, Rfl: 3   Cholecalciferol  (CVS D3) 25 MCG (1000 UT) capsule, Take 1,000 Units by mouth daily. (Patient not taking: Reported on 02/13/2024), Disp: , Rfl:    fluticasone  (FLONASE ) 50 MCG/ACT nasal spray, Place 2 sprays into both nostrils daily. (Patient not taking: Reported on 02/13/2024), Disp: 16 g, Rfl: 6   Moringa Oleifera (MORINGA PO), Take by mouth. (Patient not taking: Reported on 02/13/2024), Disp: , Rfl:    Multiple Vitamin (MULTIVITAMIN WITH MINERALS) TABS tablet, Take 1 tablet by mouth daily., Disp: , Rfl:    NON FORMULARY, [place], Disp: , Rfl:    OVER THE COUNTER MEDICATION, Take 1 capsule by mouth daily. [place], Disp: , Rfl:    psyllium (METAMUCIL) 58.6 % packet, Take 1 packet by mouth daily as needed (constipation). (Patient not taking: Reported on 02/13/2024), Disp: , Rfl:    ZEPBOUND  2.5 MG/0.5ML injection vial, INJECT 0.5 ML (2.5 MG) UNDER THE SKIN ONCE WEEKLY (0.5ML= 50 UNITS) (Patient not taking: Reported  on 02/13/2024), Disp: 2 mL, Rfl: 6      Objective:   Vitals:   02/13/24 0911  BP: 122/62  Pulse: 67  Temp: 98.3 F (36.8 C)  TempSrc: Oral  SpO2: 99%  Weight: 140 lb (63.5 kg)  Height: 5' (1.524 m)    Estimated body mass index is 27.34 kg/m as calculated from the following:   Height as of this encounter: 5' (1.524 m).   Weight as of this encounter: 140 lb (63.5 kg).  @WEIGHTCHANGE @  [employer]   02/13/24 0911  Weight: 140 lb (63.5 kg)     Physical Exam   General: No distress. Looks well O2 at rest: no Cane present: non Sitting in wheel chair: on Frail: on Obese: ono Neuro: Alert and Oriented x 3. GCS 15. Speech normal Psych: Pleasant Resp:  Barrel Chest - no.  Wheeze - noy, Crackles - yes base, No overt respiratory distress CVS: Normal heart sounds. Murmurs - no Ext: Stigmata of Connective Tissue Disease - no HEENT: Normal upper airway. PEERL +. No post nasal drip        Assessment/     Assessment & Plan Interstitial lung disease due to connective tissue  disease (HCC)  Raynaud's phenomenon without gangrene  Myositis associated antibody positive  On Cellcept therapy  Immunosuppressed status    PLAN Patient Instructions     ICD-10-CM   1. Interstitial lung disease due to connective tissue disease (HCC)  J84.89    M35.9     2. Raynaud's phenomenon without gangrene  I73.00     3. Myositis associated antibody positive  R76.89     4. On Cellcept therapy  Z79.624     5. Immunosuppressed status  D84.9       Interstitial lung disease due to connective tissue disease (HCC) Raynaud's disease without gangrene ANA positive Myositis associated antibody positive On Cellcept therapy Immunosuppressed status (HCC)   - tolerating  cellcept  since May/June 2023 via Dr Curt - 2nd opinion at [facility] Dr Jesus - June 2025: but get CT - currenlty ILD stable  Plan - continue cellcept via Dr Curt;     -No need to escalate. - Patient CT chest December 2025 I had a Dr. Jesus visit at [facility] in January 2026 -- do spirometry and dlco May 2026 at our / practice  Pulmonary Hypertension  - off  revatio   Plan - per  Dr Fontaine  5mm Lingular Nodule March 2023 -> no change MArch 2024 Does not meet criteria for lung cancer screening  Plan  - expectant followup    Followup - keep  Jan 2026 visti with [facility] Dr Jesus  -end May 2026  after spiromery and dlco with Dr Geronimo;   -Pulmonary function test to be done on the same day as the visit in order to avoid patient anxiety  - symptoms score and sit/stand test at followup  - 15 min visit    FOLLOWUP    No follow-ups on file.    SIGNATURE    Dr. Dorethia Geronimo, M.D., F.C.C.P,  Pulmonary and Critical Care Medicine Staff Physician, [facility] Director - Interstitial Lung Disease  Program  Pulmonary Fibrosis [facility] at [facility] [place], [place], [address]  Pager: [phone], If no answer or between   15:00h - 7:00h: call [phone] Telephone: [phone]  9:40 AM 02/13/2024

## 2024-02-13 NOTE — Progress Notes (Signed)
 Spirometry and diffusion capacity performed today.

## 2024-02-13 NOTE — Patient Instructions (Signed)
 ICD-10-CM   1. Interstitial lung disease due to connective tissue disease (HCC)  J84.89    M35.9     2. Raynaud's phenomenon without gangrene  I73.00     3. Myositis associated antibody positive  R76.89     4. On Cellcept therapy  Z79.624     5. Immunosuppressed status  D84.9       Interstitial lung disease due to connective tissue disease (HCC) Raynaud's disease without gangrene ANA positive Myositis associated antibody positive On Cellcept therapy Immunosuppressed status (HCC)   - tolerating  cellcept  since May/June 2023 via Dr Curt - 2nd opinion at [facility] Dr Jesus - June 2025: but get CT - currenlty ILD stable  Plan - continue cellcept via Dr Curt;     -No need to escalate. - Patient CT chest December 2025 I had a Dr. Jesus visit at [facility] in January 2026 -- do spirometry and dlco May 2026 at our / practice  Pulmonary Hypertension  - off  revatio   Plan - per  Dr Fontaine  5mm Lingular Nodule March 2023 -> no change MArch 2024 Does not meet criteria for lung cancer screening  Plan  - expectant followup    Followup - keep  Jan 2026 visti with [facility] Dr Jesus  -end May 2026  after spiromery and dlco with Dr Geronimo;   -Pulmonary function test to be done on the same day as the visit in order to avoid patient anxiety  - symptoms score and sit/stand test at followup  - 15 min visit

## 2024-02-22 ENCOUNTER — Encounter: Payer: Self-pay | Admitting: Internal Medicine

## 2024-02-22 NOTE — Telephone Encounter (Signed)
 Patient has the number to call to get scheduled. She will give them a call. NFN

## 2024-02-22 NOTE — Telephone Encounter (Signed)
 Please schedule patient ct scan

## 2024-02-28 DIAGNOSIS — M359 Systemic involvement of connective tissue, unspecified: Secondary | ICD-10-CM | POA: Diagnosis not present

## 2024-02-28 DIAGNOSIS — Z79899 Other long term (current) drug therapy: Secondary | ICD-10-CM | POA: Diagnosis not present

## 2024-02-28 DIAGNOSIS — R760 Raised antibody titer: Secondary | ICD-10-CM | POA: Diagnosis not present

## 2024-02-28 DIAGNOSIS — M1990 Unspecified osteoarthritis, unspecified site: Secondary | ICD-10-CM | POA: Diagnosis not present

## 2024-02-28 DIAGNOSIS — J849 Interstitial pulmonary disease, unspecified: Secondary | ICD-10-CM | POA: Diagnosis not present

## 2024-02-28 DIAGNOSIS — R7689 Other specified abnormal immunological findings in serum: Secondary | ICD-10-CM | POA: Diagnosis not present

## 2024-02-28 DIAGNOSIS — I7300 Raynaud's syndrome without gangrene: Secondary | ICD-10-CM | POA: Diagnosis not present

## 2024-03-04 ENCOUNTER — Encounter: Payer: Self-pay | Admitting: Radiology

## 2024-03-06 ENCOUNTER — Encounter: Payer: Self-pay | Admitting: Nurse Practitioner

## 2024-03-07 ENCOUNTER — Other Ambulatory Visit: Payer: Self-pay | Admitting: Nurse Practitioner

## 2024-03-07 DIAGNOSIS — N644 Mastodynia: Secondary | ICD-10-CM

## 2024-04-02 ENCOUNTER — Ambulatory Visit
Admission: RE | Admit: 2024-04-02 | Discharge: 2024-04-02 | Disposition: A | Source: Ambulatory Visit | Attending: Nurse Practitioner | Admitting: Nurse Practitioner

## 2024-04-02 ENCOUNTER — Ambulatory Visit

## 2024-04-02 DIAGNOSIS — R928 Other abnormal and inconclusive findings on diagnostic imaging of breast: Secondary | ICD-10-CM | POA: Diagnosis not present

## 2024-04-02 DIAGNOSIS — N644 Mastodynia: Secondary | ICD-10-CM

## 2024-04-03 ENCOUNTER — Ambulatory Visit: Payer: Self-pay | Admitting: Nurse Practitioner

## 2024-04-08 ENCOUNTER — Ambulatory Visit (HOSPITAL_BASED_OUTPATIENT_CLINIC_OR_DEPARTMENT_OTHER)
Admission: RE | Admit: 2024-04-08 | Discharge: 2024-04-08 | Disposition: A | Source: Ambulatory Visit | Attending: Internal Medicine | Admitting: Internal Medicine

## 2024-04-08 DIAGNOSIS — R7689 Other specified abnormal immunological findings in serum: Secondary | ICD-10-CM

## 2024-04-08 DIAGNOSIS — J849 Interstitial pulmonary disease, unspecified: Secondary | ICD-10-CM | POA: Diagnosis not present

## 2024-04-08 DIAGNOSIS — D849 Immunodeficiency, unspecified: Secondary | ICD-10-CM | POA: Diagnosis not present

## 2024-04-08 DIAGNOSIS — Z79624 Long term (current) use of inhibitors of nucleotide synthesis: Secondary | ICD-10-CM

## 2024-04-08 DIAGNOSIS — J8489 Other specified interstitial pulmonary diseases: Secondary | ICD-10-CM | POA: Diagnosis not present

## 2024-04-08 DIAGNOSIS — M359 Systemic involvement of connective tissue, unspecified: Secondary | ICD-10-CM | POA: Diagnosis not present

## 2024-04-08 DIAGNOSIS — I7300 Raynaud's syndrome without gangrene: Secondary | ICD-10-CM | POA: Diagnosis not present

## 2024-04-08 DIAGNOSIS — I3139 Other pericardial effusion (noninflammatory): Secondary | ICD-10-CM | POA: Diagnosis not present

## 2024-04-19 ENCOUNTER — Other Ambulatory Visit: Payer: Self-pay | Admitting: Gastroenterology

## 2024-04-19 DIAGNOSIS — Z9884 Bariatric surgery status: Secondary | ICD-10-CM

## 2024-04-19 DIAGNOSIS — K449 Diaphragmatic hernia without obstruction or gangrene: Secondary | ICD-10-CM

## 2024-04-19 DIAGNOSIS — K259 Gastric ulcer, unspecified as acute or chronic, without hemorrhage or perforation: Secondary | ICD-10-CM

## 2024-04-19 DIAGNOSIS — Z8711 Personal history of peptic ulcer disease: Secondary | ICD-10-CM

## 2024-04-24 ENCOUNTER — Ambulatory Visit: Payer: Self-pay | Admitting: Internal Medicine

## 2024-04-24 NOTE — Progress Notes (Signed)
 Good news ILD stable since 2023 -> Dec 2025 onCt  Other chronic findings +

## 2024-05-07 ENCOUNTER — Encounter (HOSPITAL_COMMUNITY): Payer: Self-pay | Admitting: Internal Medicine

## 2024-05-07 ENCOUNTER — Ambulatory Visit (HOSPITAL_COMMUNITY)
Admission: RE | Admit: 2024-05-07 | Discharge: 2024-05-07 | Disposition: A | Discharge status: Home / Self Care | Source: Ambulatory Visit | Attending: Internal Medicine | Admitting: Internal Medicine

## 2024-05-07 VITALS — BP 122/60 | HR 68 | Ht 61.0 in | Wt 144.0 lb

## 2024-05-07 DIAGNOSIS — J849 Interstitial pulmonary disease, unspecified: Secondary | ICD-10-CM | POA: Insufficient documentation

## 2024-05-07 DIAGNOSIS — I272 Pulmonary hypertension, unspecified: Secondary | ICD-10-CM | POA: Diagnosis not present

## 2024-05-07 DIAGNOSIS — E669 Obesity, unspecified: Secondary | ICD-10-CM

## 2024-05-07 DIAGNOSIS — R0609 Other forms of dyspnea: Secondary | ICD-10-CM | POA: Insufficient documentation

## 2024-05-07 DIAGNOSIS — Z79899 Other long term (current) drug therapy: Secondary | ICD-10-CM | POA: Insufficient documentation

## 2024-05-07 DIAGNOSIS — M1711 Unilateral primary osteoarthritis, right knee: Secondary | ICD-10-CM | POA: Insufficient documentation

## 2024-05-07 DIAGNOSIS — Z87891 Personal history of nicotine dependence: Secondary | ICD-10-CM | POA: Diagnosis not present

## 2024-05-07 DIAGNOSIS — E782 Mixed hyperlipidemia: Secondary | ICD-10-CM | POA: Diagnosis not present

## 2024-05-07 DIAGNOSIS — Z6827 Body mass index (BMI) 27.0-27.9, adult: Secondary | ICD-10-CM | POA: Insufficient documentation

## 2024-05-07 DIAGNOSIS — Z7982 Long term (current) use of aspirin: Secondary | ICD-10-CM | POA: Insufficient documentation

## 2024-05-07 DIAGNOSIS — I2510 Atherosclerotic heart disease of native coronary artery without angina pectoris: Secondary | ICD-10-CM | POA: Diagnosis not present

## 2024-05-07 DIAGNOSIS — Z79624 Long term (current) use of inhibitors of nucleotide synthesis: Secondary | ICD-10-CM | POA: Diagnosis not present

## 2024-05-07 LAB — LIPID PANEL
Cholesterol: 248 mg/dL — ABNORMAL HIGH (ref 0–200)
HDL: 102 mg/dL
LDL Cholesterol: 137 mg/dL — ABNORMAL HIGH (ref 0–99)
Total CHOL/HDL Ratio: 2.4 ratio
Triglycerides: 44 mg/dL
VLDL: 9 mg/dL (ref 0–40)

## 2024-05-07 LAB — COMPREHENSIVE METABOLIC PANEL WITH GFR
ALT: 20 U/L (ref 0–44)
AST: 35 U/L (ref 15–41)
Albumin: 4.3 g/dL (ref 3.5–5.0)
Alkaline Phosphatase: 70 U/L (ref 38–126)
Anion gap: 8 (ref 5–15)
BUN: 17 mg/dL (ref 8–23)
CO2: 30 mmol/L (ref 22–32)
Calcium: 10.6 mg/dL — ABNORMAL HIGH (ref 8.9–10.3)
Chloride: 99 mmol/L (ref 98–111)
Creatinine, Ser: 0.74 mg/dL (ref 0.44–1.00)
GFR, Estimated: >60 mL/min
Glucose, Bld: 81 mg/dL (ref 70–99)
Potassium: 5 mmol/L (ref 3.5–5.1)
Sodium: 137 mmol/L (ref 135–145)
Total Bilirubin: 0.4 mg/dL (ref 0.0–1.2)
Total Protein: 6.9 g/dL (ref 6.5–8.1)

## 2024-05-07 MED ORDER — ROSUVASTATIN CALCIUM 5 MG PO TABS: 5.0000 mg | ORAL_TABLET | Freq: Every day | ORAL | 3 refills | Status: AC

## 2024-05-07 MED ORDER — METOPROLOL TARTRATE 100 MG PO TABS: 100.0000 mg | ORAL_TABLET | Freq: Once | ORAL | 0 refills | Status: AC

## 2024-05-07 NOTE — Progress Notes (Signed)
 "  ADVANCED HF CLINIC NOTE  Primary Care: Elnor Lauraine BRAVO, NP Rheumatologist: Dr. Aryl  HF Cardiologist: Dr. Cherrie  HPI: Chelsea Callahan is a 74 y.o. obese female with history of autoimmune disease (positive ANA 1:1,280), ILD and osteoarthritis. She has intermittent symptoms of Raynaud's but denies any digital ulcerations.   Had hi-res CT chest 3/23 suggestive of ILD with enlarged pulmonary arteries. Esophagus normal   Was a light smoker (6-7 cigs/day) for 15 years quit 1999. No h/o DVT or PE. No diet drugs.  Home sleep study- negative.   Remains on cellcept.  RHC 6/23 RA = 8 RV = 45/11 PA = 45/13 (28) PCW = 17 Fick cardiac output/index = 5.2/2.9 PVR = 2.1 FA sat = 97% PA sat = 69%, 70% SVC sat = 66%  Follow up 6/24, off sildenafil  x 1 month w/o any change in symptoms. Plan to repeat echo, if evidence of RV strain or elevated PVSP, plan to repeat RHC.  Echo 7/24 EF 55-60%, RV ok, severe LAE, RVSP 35 mmHg  Hi-res CT chest 04/08/24: ILD stable from 3/23. Small pericardiac effusion + coronary calcifications   Lab Results  Component Value Date   CHOL 222 (H) 08/01/2022   HDL 87.20 08/01/2022   LDLCALC 122 (H) 08/01/2022   TRIG 61.0 08/01/2022   CHOLHDL 3 08/01/2022     Today she returns for routine f/u and also to ask questions about recent CT scan. Feels great very active. No CP. Mild exertional SOB. No syncope or presyncope. has lost 30+ pounds with ZepBound . Occasional skipped beats  .   Past Medical History:  Diagnosis Date   Anemia    Auto immune neutropenia    Blood transfusion without reported diagnosis    Cataract    bil/ per pt   GERD (gastroesophageal reflux disease)    Lung disease 2022   Primary osteoarthritis of right knee    Raynaud disease    Spinal headache    c-section   Vasculitis    on prednisone  -    Current Outpatient Medications  Medication Sig Dispense Refill   aspirin  EC 81 MG tablet Take 81 mg by mouth daily. Swallow whole.      Fe Bisgly-Succ-C-Thre-B12-FA (IRON-150 PO) Take 1 tablet by mouth every other day.     hydroxychloroquine  (PLAQUENIL ) 200 MG tablet TAKE 1 TABLET BY MOUTH EVERY DAY 90 tablet 0   mycophenolate (CELLCEPT) 500 MG tablet Take 500 mg by mouth 2 (two) times daily.     omeprazole  (PRILOSEC) 40 MG capsule Take 1 capsule (40 mg total) by mouth daily. 90 capsule 1   ZEPBOUND  2.5 MG/0.5ML injection vial INJECT 0.5 ML (2.5 MG) UNDER THE SKIN ONCE WEEKLY (0.5ML= 50 UNITS) 2 mL 6   No current facility-administered medications for this encounter.   Allergies  Allergen Reactions   Ginger Swelling and Other (See Comments)    Stomach swells   Porcine (Pork) Protein-Containing Drug Products Other (See Comments)    UNSPECIFIED PERSONAL REASONS   Shellfish Allergy Other (See Comments)    UNSPECIFIED Personal reasons/dietary   Social History   Socioeconomic History   Marital status: Married    Spouse name: Not on file   Number of children: 6   Years of education: Not on file   Highest education level: Master's degree (e.g., MA, MS, MEng, MEd, MSW, MBA)  Occupational History   Occupation: [employer] RETIRED  Tobacco Use   Smoking status: Former    Current packs/day: 0.00  Average packs/day: 0.3 packs/day for 15.0 years (3.8 ttl pk-yrs)    Types: Cigarettes    Start date: 9    Quit date: 1999    Years since quitting: 27.0    Passive exposure: Past   Smokeless tobacco: Never  Vaping Use   Vaping status: Never Used  Substance and Sexual Activity   Alcohol use: Yes    Comment: wine rarely   Drug use: No   Sexual activity: Not on file  Other Topics Concern   Not on file  Social History Narrative   Lives with husband/2025   Social Drivers of Health   Tobacco Use: Medium Risk (05/07/2024)   Patient History    Smoking Tobacco Use: Former    Smokeless Tobacco Use: Never    Passive Exposure: Past  Physicist, Medical Strain: Low Risk (01/03/2024)   Overall Financial Resource Strain (CARDIA)     Difficulty of Paying Living Expenses: Not hard at all  Food Insecurity: No Food Insecurity (01/03/2024)   Epic    Worried About Radiation Protection Practitioner of Food in the Last Year: Never true    Ran Out of Food in the Last Year: Never true  Transportation Needs: No Transportation Needs (01/03/2024)   Epic    Lack of Transportation (Medical): No    Lack of Transportation (Non-Medical): No  Physical Activity: Sufficiently Active (01/03/2024)   Exercise Vital Sign    Days of Exercise per Week: 6 days    Minutes of Exercise per Session: 70 min  Stress: Patient Declined (01/03/2024)   [employer] of Occupational Health - Occupational Stress Questionnaire    Feeling of Stress: Patient declined  Social Connections: Socially Integrated (01/03/2024)   Social Connection and Isolation Panel    Frequency of Communication with Friends and Family: Three times a week    Frequency of Social Gatherings with Friends and Family: More than three times a week    Attends Religious Services: More than 4 times per year    Active Member of Clubs or Organizations: Yes    Attends Banker Meetings: More than 4 times per year    Marital Status: Married  Catering Manager Violence: Not At Risk (01/04/2024)   Epic    Fear of Current or Ex-Partner: No    Emotionally Abused: No    Physically Abused: No    Sexually Abused: No  Depression (PHQ2-9): Low Risk (01/04/2024)   Depression (PHQ2-9)    PHQ-2 Score: 0  Alcohol Screen: Low Risk (01/03/2024)   Alcohol Screen    Last Alcohol Screening Score (AUDIT): 2  Housing: Unknown (01/03/2024)   Epic    Unable to Pay for Housing in the Last Year: No    Number of Times Moved in the Last Year: Not on file    Homeless in the Last Year: No  Utilities: Not At Risk (01/04/2024)   Epic    Threatened with loss of utilities: No  Health Literacy: Adequate Health Literacy (01/04/2024)   B1300 Health Literacy    Frequency of need for help with medical instructions: Never   Family History   Problem Relation Age of Onset   Cancer Brother    Multiple myeloma Brother    Colon cancer Neg Hx    Esophageal cancer Neg Hx    Rectal cancer Neg Hx    Stomach cancer Neg Hx    Colon polyps Neg Hx    BP 122/60   Pulse 68   Ht 5' 1 (1.549 m)  Wt 65.3 kg (144 lb)   SpO2 100%   BMI 27.21 kg/m   Wt Readings from Last 3 Encounters:  05/07/24 65.3 kg (144 lb)  02/13/24 63.5 kg (140 lb)  02/01/24 64 kg (141 lb)   PHYSICAL EXAM: General:  Looks younger than stated age No resp difficulty HEENT: normal Neck: supple. no JVD.  Cor: Regular rate & rhythm. No rubs, gallops or murmurs. Lungs: clear Abdomen: soft, nontender, nondistended.Good bowel sounds. Extremities: no cyanosis, clubbing, rash, edema Neuro: alert & orientedx3, cranial nerves grossly intact. moves all 4 extremities w/o difficulty. Affect pleasant   ASSESSMENT & PLAN:  1. 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 RV normal. RVSP 40.  - RHC 6/23 RA 8 PA 45/13 (28) PCW  17 Fick 5.2/2.9 PVR  2.1 - Sleep study negative for OSA - PFTs 6/24 FEV1 1.84 (80%) FVC 1.92 (70%) DLCO 70% - Had been off sildenafil  x 1 month (10/2022) without any change in symptoms - Echo 7/24 EF 55-60%, RV ok, severe LAE, RVSP 35 mmHg - NYHA I-II Volume ok  - Will repeat echo to reassess RV and RVSP. Can proceed to repeat RHC as needed  2. ILD - on cellcept.  - hi-res CT 3/24 c/w ILD - hi-res CT 12/25 stable ILD - follows with Dr. Geronimo  3. Morbid obesity - Body mass index is 27.21 kg/m. - has lost 30+ pounds with ZepBound   4. Coronary calcifications on CT - I reviewed personally. Has fairly significant burden - also having some exertional dyspnea - continue ASA 81 - start Crestor  5 (check lipids) - Check coronary CTA  Toribio Fuel, MD  11:35 AM  "

## 2024-05-07 NOTE — Patient Instructions (Signed)
 Medication Changes:  START Crestor  5 mg Daily  Lab Work:  Labs done today, your results will be available in MyChart, we will contact you for abnormal readings.  Testing/Procedures:  Your physician has requested that you have an echocardiogram. Echocardiography is a painless test that uses sound waves to create images of your heart. It provides your doctor with information about the size and shape of your heart and how well your hearts chambers and valves are working. This procedure takes approximately one hour. There are no restrictions for this procedure. Please do NOT wear cologne, perfume, aftershave, or lotions (deodorant is allowed). Please arrive 15 minutes prior to your appointment time.  Please note: We ask at that you not bring children with you during ultrasound (echo/ vascular) testing. Due to room size and safety concerns, children are not allowed in the ultrasound rooms during exams. Our front office staff cannot provide observation of children in our lobby area while testing is being conducted. An adult accompanying a patient to their appointment will only be allowed in the ultrasound room at the discretion of the ultrasound technician under special circumstances. We apologize for any inconvenience.     Your cardiac CT will be scheduled at one of the below locations:   [facility] [address] [place], [place] [address] [phone] (Severe contrast allergies only)   If scheduled at [facility], please arrive at the [facility] and Children's Entrance (Entrance C2) of [facility] 30 minutes prior to test start time. You can use the FREE valet parking offered at entrance C (encouraged to control the heart rate for the test)  Proceed to the [facility] Radiology Department (first floor) to check-in and test prep.  All radiology patients and guests should use entrance C2 at [facility], accessed from [facility], even though  the hospital's physical address listed is [address]  If scheduled at the Heart and Vascular Tower at [address], please enter the parking lot using the [address] entrance and use the FREE valet service at the patient drop-off area. Enter the building and check-in with registration on the main floor.  If scheduled at [facility], please arrive to the Heart and Vascular Center 15 mins early for check-in and test prep.  There is spacious parking and easy access to the radiology department from the [facility] Heart and Vascular entrance. Please enter here and check-in with the desk attendant.   If scheduled at [facility], please arrive 30 minutes early for check-in and test prep.  Please follow these instructions carefully (unless otherwise directed):  An IV will be required for this test and Nitroglycerin will be given.  Hold all erectile dysfunction medications at least 3 days (72 hrs) prior to test. (Ie viagra , cialis, sildenafil , tadalafil, etc)   On the Night Before the Test: Be sure to Drink plenty of water. Do not consume any caffeinated/decaffeinated beverages or chocolate 12 hours prior to your test. Do not take any antihistamines 12 hours prior to your test.   On the Day of the Test: Drink plenty of water until 1 hour prior to the test. Do not eat any food 1 hour prior to test. You may take your regular medications prior to the test.  Take metoprolol  (Lopressor ) 100 mg two hours prior to test. If you take Furosemide /Hydrochlorothiazide/Spironolactone/Chlorthalidone, please HOLD on the morning of the test. Patients who wear a continuous glucose monitor MUST remove the device prior  to scanning. FEMALES- please wear underwire-free bra if available, avoid dresses & tight clothing      After the Test: Drink plenty of water. After receiving IV contrast, you may experience a mild flushed feeling. This is normal. On occasion, you may  experience a mild rash up to 24 hours after the test. This is not dangerous. If this occurs, you can take Benadryl  25 mg, Zyrtec, Claritin, or Allegra and increase your fluid intake. (Patients taking Tikosyn should avoid Benadryl , and may take Zyrtec, Claritin, or Allegra) If you experience trouble breathing, this can be serious. If it is severe call 911 IMMEDIATELY. If it is mild, please call our office.  We will call to schedule your test 2-4 weeks out understanding that some insurance companies will need an authorization prior to the service being performed.   For more information and frequently asked questions, please visit our website : [email or web address]  For non-scheduling related questions, please contact the cardiac imaging nurse navigator should you have any questions/concerns: Cardiac Imaging Nurse Navigators Direct Office Dial: [phone]   For scheduling needs, including cancellations and rescheduling, please call Brittany, [phone].   Special Instructions // Education:  Do the following things EVERYDAY: Weigh yourself in the morning before breakfast. Write it down and keep it in a log. Take your medicines as prescribed Eat low salt foods--Limit salt (sodium) to 2000 mg per day.  Stay as active as you can everyday Limit all fluids for the day to less than 2 liters   Follow-Up in: 4 months (May), **PLEASE CALL OUR OFFICE IN MARCH TO SCHEDULE THIS APPOINTMENT    At the [facility], you and your health needs are our priority. We have a designated team specialized in the treatment of Heart Failure. This Care Team includes your primary Heart Failure Specialized Cardiologist (physician), Advanced Practice Providers (APPs- Physician Assistants and Nurse Practitioners), and Pharmacist who all work together to provide you with the care you need, when you need it.   You may see any of the following providers on your designated Care Team at your  next follow up:  Dr. Toribio Fuel Dr. Ezra Shuck Dr. Odis Brownie Greig Mosses, NP Caffie Shed, [place] [facility] [place], [place] Beckey Coe, NP Jordan Lee, NP Tinnie Redman, PharmD   Please be sure to bring in all your medications bottles to every appointment.   Need to Contact Us :  If you have any questions or concerns before your next appointment please send us  a message through [facility] or call our office at [phone].    TO LEAVE A MESSAGE FOR THE NURSE SELECT OPTION 2, PLEASE LEAVE A MESSAGE INCLUDING: YOUR NAME DATE OF BIRTH CALL BACK NUMBER REASON FOR CALL**this is important as we prioritize the call backs  YOU WILL RECEIVE A CALL BACK THE SAME DAY AS LONG AS YOU CALL BEFORE 4:00 PM

## 2024-05-07 NOTE — Addendum Note (Signed)
 Encounter addended by: Buell Powell HERO, RN on: 05/07/2024 12:10 PM  Actions taken: Visit diagnoses modified, Diagnosis association updated, Pend clinical note, Pharmacy for encounter modified, Order list changed, Clinical Note Signed, Charge Capture section accepted

## 2024-05-13 ENCOUNTER — Encounter (HOSPITAL_COMMUNITY): Payer: Self-pay

## 2024-05-14 ENCOUNTER — Ambulatory Visit (HOSPITAL_COMMUNITY): Admission: RE | Admit: 2024-05-14 | Discharge: 2024-05-14 | Attending: Internal Medicine | Admitting: Internal Medicine

## 2024-05-14 ENCOUNTER — Ambulatory Visit (HOSPITAL_COMMUNITY)
Admission: RE | Admit: 2024-05-14 | Discharge: 2024-05-14 | Disposition: A | Source: Ambulatory Visit | Attending: Internal Medicine | Admitting: Internal Medicine

## 2024-05-14 DIAGNOSIS — I2510 Atherosclerotic heart disease of native coronary artery without angina pectoris: Secondary | ICD-10-CM

## 2024-05-14 DIAGNOSIS — I509 Heart failure, unspecified: Secondary | ICD-10-CM | POA: Diagnosis not present

## 2024-05-14 DIAGNOSIS — I272 Pulmonary hypertension, unspecified: Secondary | ICD-10-CM | POA: Insufficient documentation

## 2024-05-14 DIAGNOSIS — I358 Other nonrheumatic aortic valve disorders: Secondary | ICD-10-CM | POA: Diagnosis not present

## 2024-05-14 DIAGNOSIS — R0609 Other forms of dyspnea: Secondary | ICD-10-CM | POA: Insufficient documentation

## 2024-05-14 DIAGNOSIS — I340 Nonrheumatic mitral (valve) insufficiency: Secondary | ICD-10-CM | POA: Diagnosis not present

## 2024-05-14 LAB — ECHOCARDIOGRAM COMPLETE
AR max vel: 1.75 cm2
AV Area VTI: 1.6 cm2
AV Area mean vel: 1.59 cm2
AV Mean grad: 3 mmHg
AV Peak grad: 5.8 mmHg
Ao pk vel: 1.2 m/s
Area-P 1/2: 4.6 cm2
Calc EF: 69.7 %
S' Lateral: 3.6 cm
Single Plane A2C EF: 68 %
Single Plane A4C EF: 72 %

## 2024-05-14 MED ORDER — IOHEXOL 350 MG/ML SOLN
100.0000 mL | Freq: Once | INTRAVENOUS | Status: AC | PRN
  Administered 2024-05-14: 100 mL via INTRAVENOUS

## 2024-05-14 MED ORDER — NITROGLYCERIN 0.4 MG SL SUBL
0.8000 mg | SUBLINGUAL_TABLET | Freq: Once | SUBLINGUAL | Status: AC
  Administered 2024-05-14: 0.8 mg via SUBLINGUAL

## 2024-05-28 ENCOUNTER — Ambulatory Visit (HOSPITAL_COMMUNITY): Payer: Self-pay | Admitting: Internal Medicine

## 2024-05-31 ENCOUNTER — Encounter: Payer: Self-pay | Admitting: Nurse Practitioner

## 2024-06-05 NOTE — Telephone Encounter (Signed)
Form on provider desk
